# Patient Record
Sex: Female | Born: 1955
Health system: Southern US, Community
[De-identification: ages and names within clinical notes are randomized; demographics above are authoritative.]

## PROBLEM LIST (undated history)

## (undated) DIAGNOSIS — M1990 Unspecified osteoarthritis, unspecified site: Secondary | ICD-10-CM

## (undated) DIAGNOSIS — H8109 Meniere's disease, unspecified ear: Secondary | ICD-10-CM

## (undated) DIAGNOSIS — N200 Calculus of kidney: Secondary | ICD-10-CM

## (undated) DIAGNOSIS — J449 Chronic obstructive pulmonary disease, unspecified: Secondary | ICD-10-CM

## (undated) DIAGNOSIS — N12 Tubulo-interstitial nephritis, not specified as acute or chronic: Secondary | ICD-10-CM

## (undated) DIAGNOSIS — I82409 Acute embolism and thrombosis of unspecified deep veins of unspecified lower extremity: Secondary | ICD-10-CM

## (undated) DIAGNOSIS — R590 Localized enlarged lymph nodes: Secondary | ICD-10-CM

## (undated) DIAGNOSIS — C4491 Basal cell carcinoma of skin, unspecified: Secondary | ICD-10-CM

## (undated) DIAGNOSIS — J302 Other seasonal allergic rhinitis: Secondary | ICD-10-CM

## (undated) DIAGNOSIS — J189 Pneumonia, unspecified organism: Secondary | ICD-10-CM

## (undated) DIAGNOSIS — Z8709 Personal history of other diseases of the respiratory system: Secondary | ICD-10-CM

## (undated) DIAGNOSIS — M810 Age-related osteoporosis without current pathological fracture: Secondary | ICD-10-CM

## (undated) DIAGNOSIS — D0470 Carcinoma in situ of skin of unspecified lower limb, including hip: Secondary | ICD-10-CM

## (undated) HISTORY — DX: Calculus of kidney: N20.0

## (undated) HISTORY — DX: Basal cell carcinoma of skin, unspecified: C44.91

## (undated) HISTORY — DX: Acute embolism and thrombosis of unspecified deep veins of unspecified lower extremity: I82.409

## (undated) HISTORY — DX: Carcinoma in situ of skin of unspecified lower limb, including hip: D04.70

## (undated) HISTORY — DX: Pneumonia, unspecified organism: J18.9

## (undated) HISTORY — DX: Unspecified osteoarthritis, unspecified site: M19.90

## (undated) HISTORY — DX: Localized enlarged lymph nodes: R59.0

## (undated) HISTORY — DX: Tubulo-interstitial nephritis, not specified as acute or chronic: N12

## (undated) HISTORY — DX: Age-related osteoporosis without current pathological fracture: M81.0

## (undated) HISTORY — DX: Personal history of other diseases of the respiratory system: Z87.09

## (undated) HISTORY — DX: Chronic obstructive pulmonary disease, unspecified: J44.9

## (undated) HISTORY — DX: Other seasonal allergic rhinitis: J30.2

## (undated) HISTORY — DX: Meniere's disease, unspecified ear: H81.09

---

## 1998-08-13 ENCOUNTER — Other Ambulatory Visit: Admission: RE | Admit: 1998-08-13 | Discharge: 1998-08-13 | Payer: Self-pay | Admitting: Obstetrics & Gynecology

## 1999-09-24 ENCOUNTER — Other Ambulatory Visit: Admission: RE | Admit: 1999-09-24 | Discharge: 1999-09-24 | Payer: Self-pay | Admitting: Obstetrics & Gynecology

## 2000-10-20 ENCOUNTER — Other Ambulatory Visit: Admission: RE | Admit: 2000-10-20 | Discharge: 2000-10-20 | Payer: Self-pay | Admitting: Obstetrics & Gynecology

## 2002-02-16 ENCOUNTER — Other Ambulatory Visit: Admission: RE | Admit: 2002-02-16 | Discharge: 2002-02-16 | Payer: Self-pay | Admitting: Obstetrics & Gynecology

## 2004-02-20 ENCOUNTER — Other Ambulatory Visit: Admission: RE | Admit: 2004-02-20 | Discharge: 2004-02-20 | Payer: Self-pay | Admitting: Obstetrics & Gynecology

## 2005-03-13 ENCOUNTER — Other Ambulatory Visit: Admission: RE | Admit: 2005-03-13 | Discharge: 2005-03-13 | Payer: Self-pay | Admitting: Obstetrics & Gynecology

## 2007-05-06 HISTORY — PX: DILATION AND CURETTAGE OF UTERUS: SHX78

## 2007-06-25 ENCOUNTER — Encounter: Admission: RE | Admit: 2007-06-25 | Discharge: 2007-06-25 | Payer: Self-pay | Admitting: Family Medicine

## 2008-11-15 ENCOUNTER — Observation Stay (HOSPITAL_COMMUNITY): Admission: EM | Admit: 2008-11-15 | Discharge: 2008-11-15 | Payer: Self-pay | Admitting: Emergency Medicine

## 2008-11-16 DIAGNOSIS — J90 Pleural effusion, not elsewhere classified: Secondary | ICD-10-CM | POA: Insufficient documentation

## 2008-11-16 DIAGNOSIS — J189 Pneumonia, unspecified organism: Secondary | ICD-10-CM | POA: Insufficient documentation

## 2008-11-17 ENCOUNTER — Ambulatory Visit: Payer: Self-pay | Admitting: Pulmonary Disease

## 2008-11-17 ENCOUNTER — Encounter: Payer: Self-pay | Admitting: Pulmonary Disease

## 2008-11-23 ENCOUNTER — Telehealth: Payer: Self-pay | Admitting: Adult Health

## 2008-11-24 ENCOUNTER — Ambulatory Visit: Payer: Self-pay | Admitting: Pulmonary Disease

## 2008-12-04 ENCOUNTER — Encounter: Payer: Self-pay | Admitting: Pulmonary Disease

## 2008-12-04 ENCOUNTER — Ambulatory Visit: Payer: Self-pay | Admitting: Pulmonary Disease

## 2008-12-04 DIAGNOSIS — J449 Chronic obstructive pulmonary disease, unspecified: Secondary | ICD-10-CM | POA: Insufficient documentation

## 2008-12-04 DIAGNOSIS — F17200 Nicotine dependence, unspecified, uncomplicated: Secondary | ICD-10-CM | POA: Insufficient documentation

## 2008-12-05 ENCOUNTER — Encounter: Payer: Self-pay | Admitting: Pulmonary Disease

## 2008-12-08 ENCOUNTER — Ambulatory Visit: Payer: Self-pay | Admitting: Cardiology

## 2008-12-15 ENCOUNTER — Ambulatory Visit: Payer: Self-pay | Admitting: Pulmonary Disease

## 2008-12-22 ENCOUNTER — Encounter: Payer: Self-pay | Admitting: Pulmonary Disease

## 2009-01-03 ENCOUNTER — Ambulatory Visit: Payer: Self-pay | Admitting: Pulmonary Disease

## 2009-01-15 ENCOUNTER — Telehealth (INDEPENDENT_AMBULATORY_CARE_PROVIDER_SITE_OTHER): Payer: Self-pay | Admitting: *Deleted

## 2009-01-26 ENCOUNTER — Ambulatory Visit: Payer: Self-pay | Admitting: Pulmonary Disease

## 2009-02-02 ENCOUNTER — Ambulatory Visit: Payer: Self-pay | Admitting: Pulmonary Disease

## 2009-11-06 ENCOUNTER — Telehealth (INDEPENDENT_AMBULATORY_CARE_PROVIDER_SITE_OTHER): Payer: Self-pay | Admitting: *Deleted

## 2010-02-12 ENCOUNTER — Telehealth (INDEPENDENT_AMBULATORY_CARE_PROVIDER_SITE_OTHER): Payer: Self-pay | Admitting: *Deleted

## 2010-06-04 NOTE — Progress Notes (Signed)
Summary: Spiriva Refill  Phone Note Refill Request Message from:  Fax from Pharmacy on November 06, 2009 3:11 PM  Refills Requested: Medication #1:  SPIRIVA HANDIHALER 18 MCG CAPS one puff once daily   Dosage confirmed as above?Dosage Confirmed   Brand Name Necessary? Yes   Supply Requested: 1 month   Last Refilled: 09/01/2009   Notes: Patient last seen 02/02/2009 by Tammy Parrett [facility] [place] [phone]   Method Requested: Electronic Initial call taken by: Michel Bickers CMA,  November 06, 2009 3:12 PM    Prescriptions: SPIRIVA HANDIHALER 18 MCG CAPS (TIOTROPIUM BROMIDE MONOHYDRATE) one puff once daily  #30 x 3   Entered by:   Michel Bickers CMA   Authorized by:   Coralyn Helling MD   Signed by:   Michel Bickers CMA on 11/06/2009   Method used:   [facility] to        [facility]  [address] [phone]ail[facility]       [address] [place], [place]  [address]       Ph: [phone] or [phone]       Fax: [phone]   RxID:   [id number]

## 2010-06-04 NOTE — Miscellaneous (Signed)
Summary: Orders Update  Clinical Lists Changes  Orders: Added new Test order of T-2 View CXR (71020TC) - Signed    Received call from Radiology advising pt has rx from Canary Brim NP for pt to have PA&Lat cxr. Zackery Barefoot CMA  November 17, 2008 10:07 AM

## 2010-06-04 NOTE — Progress Notes (Signed)
Summary: pna vaccine question  Phone Note Call from Patient Call back at Home Phone [phone]   Caller: Patient Call For: sood Summary of Call: Pt wants to know if she has ever had a px shot here, ok to [facility]. Initial call taken by: Darletta Moll,  February 12, 2010 1:11 PM  Follow-up for Phone Call        no pna vaccine documented in EMR.  no paper chart as pt was a new pt to [facility] in 2010.  LMOM informing pt of the above information.  Aundra Millet Reynolds LPN  February 12, 2010 1:46 PM

## 2010-07-04 LAB — HM PAP SMEAR

## 2010-07-04 LAB — HM MAMMOGRAPHY

## 2010-08-12 LAB — COMPREHENSIVE METABOLIC PANEL
ALT: 36 U/L — ABNORMAL HIGH (ref 0–35)
AST: 29 U/L (ref 0–37)
Albumin: 2.8 g/dL — ABNORMAL LOW (ref 3.5–5.2)
Alkaline Phosphatase: 94 U/L (ref 39–117)
Calcium: 9.1 mg/dL (ref 8.4–10.5)
GFR calc Af Amer: >60 mL/min (ref >60)
Glucose, Bld: 96 mg/dL (ref 70–99)
Potassium: 3.7 mEq/L (ref 3.5–5.1)
Sodium: 138 mEq/L (ref 135–145)
Total Protein: 6.2 g/dL (ref 6.0–8.3)

## 2010-08-12 LAB — CBC
MCHC: 32.9 g/dL (ref 30.0–36.0)
Platelets: 192 10*3/uL (ref 150–400)
RDW: 14.2 % (ref 11.5–15.5)

## 2010-08-12 LAB — DIFFERENTIAL
Eosinophils Absolute: 0.2 10*3/uL (ref 0.0–0.7)
Lymphs Abs: 1.8 10*3/uL (ref 0.7–4.0)
Monocytes Absolute: 1.3 10*3/uL — ABNORMAL HIGH (ref 0.1–1.0)
Monocytes Relative: 15 % — ABNORMAL HIGH (ref 3–12)
Neutrophils Relative %: 62 % (ref 43–77)

## 2010-08-12 LAB — PROTIME-INR: Prothrombin Time: 13.9 seconds (ref 11.6–15.2)

## 2010-09-17 NOTE — H&P (Signed)
[name], [name]               ACCOUNT NO.:  [id number]   MEDICAL RECORD NO.:  [id number]          PATIENT TYPE:  OBV   LOCATION:  5532                         FACILITY:  [facility]   PHYSICIAN:  Coralyn Helling, MD        DATE OF BIRTH:  1955-06-25   DATE OF ADMISSION:  11/15/2008  DATE OF DISCHARGE:  11/15/2008                              HISTORY & PHYSICAL   A 24-HOUR OBSERVATION   PRIMARY CARE PHYSICIAN:  Pam Drown, MD with [facility] at [facility].   [name] is a 55 year old female who developed symptoms of fever,  chills, and cough with production of green to blood-tinged sputum last  week.  She was also having right-sided chest pain.  She denied any  abdominal pain, nausea, vomiting, or diarrhea.  She has not had any  headaches, neck stiffness, or difficulty with swallowing.  She says her  weight has been stable.  She has not had any recent sick contacts.  She  was started on a course of Z-Pak and then it was changed to cefuroxime.  She does have a history of allergies to PENICILLIN.  She says that she  is still having cough with production of green to red-brown sputum, as  well as pleuritic-type chest pain on the right side.  She is also  suffering a fever of 100-101 degrees Fahrenheit.  She had a chest x-ray  on an outpatient basis, which showed a pleural effusion and as a result,  she was advised to come to the hospital for further evaluation of this  pleural effusion and pneumonia to determine if she needs thoracentesis  and possibly further interventions.  Of note is that she has had 2 prior  episodes of pneumonia, the most recent one being in February 2009.   PAST MEDICAL HISTORY:  Significant for:  1. Tobacco abuse.  2. Pneumonia.   She has allergies to PENICILLIN which caused her to develop a rash.   OUTPATIENT MEDICATIONS:  Cefuroxime and Advil.   PAST SURGICAL HISTORY:  Negative.   FAMILY HISTORY:  Noncontributory.   SOCIAL HISTORY:  She smokes a pack of  cigarettes per day.   REVIEW OF SYSTEMS:  Unremarkable except for stated above.   PHYSICAL EXAMINATION:  VITAL SIGNS:  Reviewed in the emergency room.  HEENT:  Pupils reactive.  There is no sinus tenderness, no oral lesions.  NECK:  No lymphadenopathy.  No jugular venous distention.  No  thyromegaly.  HEART:  S1 and S2.  Regular rate and rhythm.  No murmur.  CHEST:  She had decreased breath sounds.  There is no wheezing.  There  is dullness to percussion at the right base.  ABDOMEN:  Thin, soft, nontender, positive bowel sounds.  EXTREMITIES:  There is no edema, cyanosis, or clubbing.  NEUROLOGIC:  Cranial nerves were intact, and she had normal strength.   Chest x-ray in the emergency room today showed right basal infiltrate  with pleural effusion and bronchial thickening.   LABORATORY TESTS:  Pending at this time.   IMPRESSION:  Recurrent pneumonia with hemoptysis and pleural effusion in  a 55 year old female who has a history of tobacco abuse.  I have  recommended admitting the patient to 24-hour observation.  I will review  her laboratory tests and review her pleural effusion with ultrasound.  Depending upon the appearance of this, we will decide if she warrants  thoracentesis, and if she does require thoracentesis, the fluid will be  sent for further analysis and depending upon the results of this,  further interventions may be necessary.  At some point, she will also  likely need to have a CT scan of her chest to further evaluate the  possibilities of her recurrent pneumonia, as well as to further evaluate  her hemoptysis, although my suspicion is highly related to an infection  but again she does have a history of tobacco abuse.  In addition, I will  change her course of antibiotics to Avelox 400 mg p.o. daily.      Coralyn Helling, MD  Electronically Signed     VS/MEDQ  D:  11/15/2008  T:  11/16/2008  Job:  [id number]   cc:   Pam Drown, M.D.

## 2010-09-17 NOTE — Discharge Summary (Signed)
[name], [name]               ACCOUNT NO.:  [id number]   MEDICAL RECORD NO.:  [id number]          PATIENT TYPE:  OBV   LOCATION:  5532                         FACILITY:  [facility]   PHYSICIAN:  Coralyn Helling, MD        DATE OF BIRTH:  Mar 13, 1956   DATE OF ADMISSION:  11/15/2008  DATE OF DISCHARGE:  11/15/2008                               DISCHARGE SUMMARY   DISCHARGE DIAGNOSIS:  Pneumonia with right pleural effusion.   RADIOLOGIC DATA:  1. November 15, 2008 two-view of the chest revealed right basilar airspace      disease and small right pleural effusion.  Moderate peribronchial      thickening which likely represents chronic bronchitis/smoking.  2. November 15, 2008 bilateral decubitus revealed small layering right      pleural effusion with a focal airspace disease in right lung base.      Small infrahilar right pulmonary nodule was noted only on the left      side down view.   LABORATORY DATA:  1. November 15, 2008 CBC revealed WBC of 8.7, RBC 4.30, hemoglobin 13.4,      hematocrit 40.8, MCV 94.9, MCHC 32.9, RDW 14.2, platelet count 192      with 62% neutrophils, 20% lymphocytes, 15% monocytes and 2%      eosinophils.  2. November 15, 2008 PT of 13.9, INR 1.0.  3. November 15, 2008 PTT of 32.  4. November 15, 2008 CMP revealed sodium 138, potassium 3.7, chloride 102,      CO2 28, glucose 96, BUN 12, creatinine 0.79, total bilirubin 0.3,      alkaline phosphatase 94, AST 29, ALT 36, total protein 6.2, albumin      2.8, calcium 9.1.  November 15, 2008 LDH of 129.   HISTORY OF PRESENT ILLNESS:  [name] is a 55 year old white female  with a history of tobacco abuse and recurrent pneumonia.  She developed  fevers, chills and cough with blood streaking in the sputum last week.  She was treated with a Z-Pak and cefuroxime by her primary care  Breckyn Troyer.  Of note, she does have a PENICILLIN allergy.  She still has  had low-grade fevers, right pleuritic chest pain and cough.  Outpatient  chest x-ray showed  no pleural effusion which was confirmed on x-ray in  the emergency room.  She was admitted for 24-hour observation with the  possibility of thoracentesis after laboratory review and assessment of  effusion with ultrasound.  Her antibiotics were changed from Ceftin to  Avelox.  She also was advised on discontinuing smoking.   HOSPITAL COURSE BY DISCHARGE DIAGNOSIS:  As per above, [name] was  admitted for 24-hour observation for further evaluation of pneumonia and  right pleural effusion.  Upon further assessment with ultrasound it was  felt that only minimal fluid was visualized and thoracentesis was  deferred.  Again her antibiotics were changed to Avelox and she will  continue post discharge.   DISCHARGE INSTRUCTIONS:  1. Diet; as tolerated.  2. Activity; as tolerated.  3. Medications Avelox 400 mg by mouth daily  for 6 more days to      complete a total of 7 days.  4. The patient has been advised to use Tylenol and Motrin as needed      for pleuritic chest pain.   FOLLOWUP:  [name] will be called tomorrow a.m. with a followup  appointment as the office is now closed.  Phone number to reach her is  [phone].  She will be scheduled for a followup appointment on Friday  with Tammy Parrett at [facility].  Again of note on chest x-ray  left side down position was noted that she has a pulmonary nodule that  will need further evaluation.  It has also been discussed with [name] that if she has a worsening or increase in chest pain, shortness  of breath, fevers, cough and/or sputum production, she is to return  immediately to the [facility] emergency room for further evaluation.  She also has been advised to stop smoking.   DISPOSITION:  At the time of discharge, [name] is currently  medically stable at the time of discharge and is cleared to follow up on  Friday in office.   TIME SPENT ON DISPOSITION:  20 minutes.      Canary Brim, NP      Coralyn Helling, MD  Electronically Signed    BO/MEDQ  D:  11/15/2008  T:  11/16/2008  Job:  [id number]   cc:   Rubye Oaks, NP

## 2011-03-17 ENCOUNTER — Encounter: Payer: Self-pay | Admitting: Family Medicine

## 2011-03-17 ENCOUNTER — Ambulatory Visit (INDEPENDENT_AMBULATORY_CARE_PROVIDER_SITE_OTHER): Payer: BC Managed Care – PPO | Admitting: Family Medicine

## 2011-03-17 VITALS — BP 157/88 | HR 68 | Temp 98.1°F | Ht 64.0 in | Wt 116.1 lb

## 2011-03-17 DIAGNOSIS — T7840XA Allergy, unspecified, initial encounter: Secondary | ICD-10-CM

## 2011-03-17 DIAGNOSIS — F17200 Nicotine dependence, unspecified, uncomplicated: Secondary | ICD-10-CM

## 2011-03-17 DIAGNOSIS — J40 Bronchitis, not specified as acute or chronic: Secondary | ICD-10-CM

## 2011-03-17 DIAGNOSIS — J209 Acute bronchitis, unspecified: Secondary | ICD-10-CM

## 2011-03-17 DIAGNOSIS — IMO0002 Reserved for concepts with insufficient information to code with codable children: Secondary | ICD-10-CM | POA: Insufficient documentation

## 2011-03-17 DIAGNOSIS — Z720 Tobacco use: Secondary | ICD-10-CM

## 2011-03-17 DIAGNOSIS — Z0000 Encounter for general adult medical examination without abnormal findings: Secondary | ICD-10-CM

## 2011-03-17 MED ORDER — CHLORPHENIRAMINE-HYDROCODONE 8-10 MG/5ML PO LQCR: 5.0000 mL | Freq: Two times a day (BID) | ORAL | Status: DC | PRN

## 2011-03-17 MED ORDER — NICOTINE 7 MG/24HR TD PT24: 1.0000 | MEDICATED_PATCH | TRANSDERMAL | Status: AC

## 2011-03-17 MED ORDER — NICOTINE 14 MG/24HR TD PT24: 1.0000 | MEDICATED_PATCH | TRANSDERMAL | Status: AC

## 2011-03-17 MED ORDER — ALBUTEROL SULFATE HFA 108 (90 BASE) MCG/ACT IN AERS: 2.0000 | INHALATION_SPRAY | Freq: Four times a day (QID) | RESPIRATORY_TRACT | Status: DC | PRN

## 2011-03-17 MED ORDER — CIPROFLOXACIN HCL 500 MG PO TB24: 500.0000 mg | ORAL_TABLET | Freq: Two times a day (BID) | ORAL | Status: AC

## 2011-03-17 MED ORDER — GUAIFENESIN ER 600 MG PO TB12: 1200.0000 mg | ORAL_TABLET | Freq: Two times a day (BID) | ORAL | Status: DC

## 2011-03-17 NOTE — Patient Instructions (Addendum)
Preventative Care for Adults, Female A healthy lifestyle and preventative care can promote health and wellness. Preventative health guidelines for women include the following key practices:  A routine yearly physical is a good way to check with your caregiver about your health and preventative screening. It is a chance to share any concerns and updates on your health, and to receive a thorough exam.   Visit your dentist for a routine exam and preventative care every 6 months. Brush your teeth twice a day and floss once a day. Good oral hygiene prevents tooth decay and gum disease.   The frequency of eye exams is based on your age, health, family medical history, use of contact lenses, and other factors. Follow your caregiver's recommendations for frequency of eye exams.   Eat a healthy diet. Foods like vegetables, fruits, whole grains, low-fat dairy products, and lean protein foods contain the nutrients you need without too many calories. Decrease your intake of foods high in solid fats, added sugars, and salt. Eat the right amount of calories for you.Get information about a proper diet from your caregiver, if necessary.   Regular physical exercise is one of the most important things you can do for your health. Most adults should get at least 150 minutes of moderate-intensity exercise (any activity that increases your heart rate and causes you to sweat) each week. In addition, most adults need muscle-strengthening exercises on 2 or more days a week.   Maintain a healthy weight. The body mass index (BMI) is a screening tool to identify possible weight problems. It provides an estimate of body fat based on height and weight. Your caregiver can help determine your BMI, and can help you achieve or maintain a healthy weight.For adults 20 years and older:   A BMI below 18.5 is considered underweight.   A BMI of 18.5 to 24.9 is normal.   A BMI of 25 to 29.9 is considered overweight.   A BMI of 30 and  above is considered obese.   Maintain normal blood lipids and cholesterol levels by exercising and minimizing your intake of saturated fat. Eat a balanced diet with plenty of fruit and vegetables. Blood tests for lipids and cholesterol should begin at age 20 and be repeated every 5 years. If your lipid or cholesterol levels are high, you are over 50, or you are a high risk for heart disease, you may need your cholesterol levels checked more frequently.Ongoing high lipid and cholesterol levels should be treated with medicines if diet and exercise are not effective.   If you smoke, find out from your caregiver how to quit. If you do not use tobacco, do not start.   If you are pregnant, do not drink alcohol. If you are breastfeeding, be very cautious about drinking alcohol. If you are not pregnant and choose to drink alcohol, do not exceed 1 drink per day. One drink is considered to be 12 ounces (355 mL) of beer, 5 ounces (148 mL) of wine, or 1.5 ounces (44 mL) of liquor.   Avoid use of street drugs. Do not share needles with anyone. Ask for help if you need support or instructions about stopping the use of drugs.   High blood pressure causes heart disease and increases the risk of stroke. Your blood pressure should be checked at least every 1 to 2 years. Ongoing high blood pressure should be treated with medicines if weight loss and exercise are not effective.   If you are 55 to 55   years old, ask your caregiver if you should take aspirin to prevent strokes.   Diabetes screening involves taking a blood sample to check your fasting blood sugar level. This should be done once every 3 years, after age 45, if you are within normal weight and without risk factors for diabetes. Testing should be considered at a younger age or be carried out more frequently if you are overweight and have at least 1 risk factor for diabetes.   Breast cancer screening is essential preventative care for women. You should  practice "breast self-awareness." This means understanding the normal appearance and feel of your breasts and may include breast self-examination. Any changes detected, no matter how small, should be reported to a caregiver. Women in their 20s and 30s should have a clinical breast exam (CBE) by a caregiver as part of a regular health exam every 1 to 3 years. After age 40, women should have a CBE every year. Starting at age 40, women should consider having a mammogram (breast X-ray) every year. Women who have a family history of breast cancer should talk to their caregiver about genetic screening. Women at a high risk of breast cancer should talk to their caregiver about having an MRI and a mammogram every year.   The Pap test is a screening test for cervical cancer. A Pap test can show cell changes on the cervix that might become cervical cancer if left untreated. A Pap test is a procedure in which cells are obtained and examined from the lower end of the uterus (cervix).   Women should have a Pap test starting at age 21.   Between ages 21 and 29, Pap tests should be repeated every 2 years.   Beginning at age 30, you should have a Pap test every 3 years as long as the past 3 Pap tests have been normal.   Some women have medical problems that increase the chance of getting cervical cancer. Talk to your caregiver about these problems. It is especially important to talk to your caregiver if a new problem develops soon after your last Pap test. In these cases, your caregiver may recommend more frequent screening and Pap tests.   The above recommendations are the same for women who have or have not gotten the vaccine for human papillomavirus (HPV).   If you had a hysterectomy for a problem that was not cancer or a condition that could lead to cancer, then you no longer need Pap tests. Even if you no longer need a Pap test, a regular exam is a good idea to make sure no other problems are starting.   If you  are between ages 65 and 70, and you have had normal Pap tests going back 10 years, you no longer need Pap tests. Even if you no longer need a Pap test, a regular exam is a good idea to make sure no other problems are starting.   If you have had past treatment for cervical cancer or a condition that could lead to cancer, you need Pap tests and screening for cancer for at least 20 years after your treatment.   If Pap tests have been discontinued, risk factors (such as a new sexual partner) need to be reassessed to determine if screening should be resumed.   The HPV test is an additional test that may be used for cervical cancer screening. The HPV test looks for the virus that can cause the cell changes on the cervix. The cells collected   during the Pap test can be tested for HPV. The HPV test could be used to screen women aged 30 years and older, and should be used in women of any age who have unclear Pap test results. After the age of 30, women should have HPV testing at the same frequency as a Pap test.   Colorectal cancer can be detected and often prevented. Most routine colorectal cancer screening begins at the age of 50 and continues through age 75. However, your caregiver may recommend screening at an earlier age if you have risk factors for colon cancer. On a yearly basis, your caregiver may provide home test kits to check for hidden blood in the stool. Use of a small camera at the end of a tube, to directly examine the colon (sigmoidoscopy or colonoscopy), can detect the earliest forms of colorectal cancer. Talk to your caregiver about this at age 50, when routine screening begins. Direct examination of the colon should be repeated every 5 to 10 years through age 75, unless early forms of pre-cancerous polyps or small growths are found.   Practice safe sex. Use condoms and avoid high-risk sexual practices to reduce the spread of sexually transmitted infections (STIs). STIs include gonorrhea,  chlamydia, syphilis, trichomonas, herpes, HPV, and human immunodeficiency virus (HIV). Herpes, HIV, and HPV are viral illnesses that have no cure. They can result in disability, cancer, and death. Sexually active women aged 25 and younger should be checked for Chlamydia. Older women with new or multiple partners should also be tested for Chlamydia. Testing for other STIs is recommended if you are sexually active and at increased risk.   Osteoporosis is a disease in which the bones lose minerals and strength with aging. This can result in serious bone fractures. The risk of osteoporosis can be identified using a bone density scan. Women ages 65 and over and women at risk for fractures or osteoporosis should discuss screening with their caregivers. Ask your caregiver whether you should take a calcium supplement or vitamin D to reduce the rate of osteoporosis.   Menopause can be associated with physical symptoms and risks. Hormone replacement therapy is available to decrease symptoms and risks. You should talk to your caregiver about whether hormone replacement therapy is right for you.   Use sunscreen with skin protection factor (SPF) of 30 or more. Apply sunscreen liberally and repeatedly throughout the day. You should seek shade when your shadow is shorter than you. Protect yourself by wearing long sleeves, pants, a wide-brimmed hat, and sunglasses year round, whenever you are outdoors.   Once a month, do a whole body skin exam, using a mirror to look at the skin on your back. Notify your caregiver of new moles, moles that have irregular borders, moles that are larger than a pencil eraser, or moles that have changed in shape or color.   Stay current with required immunizations.   Influenza. You need a dose every fall (or winter). The composition of the flu vaccine changes each year, so being vaccinated once is not enough.   Pneumococcal polysaccharide. You need 1 to 2 doses if you smoke cigarettes or  if you have certain chronic medical conditions. You need 1 dose at age 65 (or older) if you have never been vaccinated.   Tetanus, diphtheria, pertussis (Tdap, Td). Get 1 dose of Tdap vaccine if you are younger than age 65 years, are over 65 and have contact with an infant, are a healthcare worker, are pregnant, or simply want   to be protected from whooping cough. After that, you need a Td booster dose every 10 years. Consult your caregiver if you have not had at least 3 tetanus and diphtheria-containing shots sometime in your life or have a deep or dirty wound.   HPV. You need this vaccine if you are a woman age 26 years or younger. The vaccine is given in 3 doses over 6 months.   Measles, mumps, rubella (MMR). You need at least 1 dose of MMR if you were born in 1957 or later. You may also need a 2nd dose.   Meningococcal. If you are age 19 to 21 years and a first-year college student living in a residence hall, or have one of several medical conditions, you need to get vaccinated against meningococcal disease. You may also need additional booster doses.   Zoster (shingles). If you are age 60 years or older, you should get this vaccine.   Varicella (chickenpox). If you have never had chickenpox or you were vaccinated but received only 1 dose, talk to your caregiver to find out if you need this vaccine.   Hepatitis A. You need this vaccine if you have a specific risk factor for hepatitis A virus infection or you simply wish to be protected from this disease. The vaccine is usually given as 2 doses, 6 to 18 months apart.   Hepatitis B. You need this vaccine if you have a specific risk factor for hepatitis B virus infection or you simply wish to be protected from this disease. The vaccine is given in 3 doses, usually over 6 months.  Preventative Services / Frequency Ages 19 to 39  Blood pressure check.** / Every 1 to 2 years.   Lipid and cholesterol check.**/ Every 5 years beginning at age 20.    Clinical breast exam.** / Every 3 years for women in their 20s and 30s.   Pap Test.** / Every 2 years from ages 21 through 29. Every 3 years starting at age 30 years through age 65 or 70 with a history of 3 consecutive normal Pap tests.   HPV Screening.** / Every 3 years from ages 30 through ages 65 to 70 with a history of 3 consecutive normal Pap tests.   Skin self-exam. / Monthly.   Influenza immunization.** / Every year.   Pneumococcal polysaccharide immunization.** / 1 to 2 doses if you smoke cigarettes or if you have certain chronic medical conditions.   Tetanus, diphtheria, pertussis (Tdap,Td) immunization. / A one-time dose of Tdap vaccine. After that, you need a Td booster dose every 10 years.   HPV immunization. / 3 doses over 6 months, if 26 and younger.   Measles, mumps, rubella (MMR) immunization. / You need at least 1 dose of MMR if you were born in 1957 or later. You may also need a 2nd dose.   Meningococcal immunization. / 1 dose if you are age 19 to 21 years and a first-year college student living in a residence hall, or have one of several medical conditions, you need to get vaccinated against meningococcal disease. You may also need additional booster doses.   Varicella immunization. **/ Consult your caregiver.   Hepatitis A immunization. ** / Consult your caregiver. 2 doses, 6 to 18 months apart.   Hepatitis B immunization.** / Consult your caregiver. 3 doses usually over 6 months.  Ages 40 to 64  Blood pressure check.** / Every 1 to 2 years.   Lipid and cholesterol check.**/ Every 5 years beginning   at age 20.   Clinical breast exam.** / Every year after age 40.   Mammogram.** / Every year beginning at age 40 and continuing for as long as you are in good health. Consult with your caregiver.   Pap Test.** / Every 3 years starting at age 30 years through age 65 or 70 with a history of 3 consecutive normal Pap tests.   HPV Screening.** / Every 3 years from  ages 30 through ages 65 to 70 with a history of 3 consecutive normal Pap tests.   Fecal occult blood test (FOBT) of stool. / Every year beginning at age 50 and continuing until age 75. You may not have to do this test if you get colonoscopy every 10 years.   Flexible sigmoidoscopy** or colonoscopy.** / Every 5 years for a flexible sigmoidoscopy or every 10 years for a colonoscopy beginning at age 50 and continuing until age 75.   Skin self-exam. / Monthly.   Influenza immunization.** / Every year.   Pneumococcal polysaccharide immunization.** / 1 to 2 doses if you smoke cigarettes or if you have certain chronic medical conditions.   Tetanus, diphtheria, pertussis (Tdap/Td) immunization.** / A one-time dose of Tdap vaccine. After that, you need a Td booster dose every 10 years.   Measles, mumps, rubella (MMR) immunization. / You need at least 1 dose of MMR if you were born in 1957 or later. You may also need a 2nd dose.   Varicella immunization. **/ Consult your caregiver.   Meningococcal immunization.** / Consult your caregiver.     Hepatitis A immunization. ** / Consult your caregiver. 2 doses, 6 to 18 months apart.   Hepatitis B immunization.** / Consult your caregiver. 3 doses, usually over 6 months.  Ages 65 and over  Blood pressure check.** / Every 1 to 2 years.   Lipid and cholesterol check.**/ Every 5 years beginning at age 20.   Clinical breast exam.** / Every year after age 40.   Mammogram.** / Every year beginning at age 40 and continuing for as long as you are in good health. Consult with your caregiver.   Pap Test,** / Every 3 years starting at age 30 years through age 65 or 70 with a 3 consecutive normal Pap tests. Testing can be stopped between 65 and 70 with 3 consecutive normal Pap tests and no abnormal Pap or HPV tests in the past 10 years.   HPV Screening.** / Every 3 years from ages 30 through ages 65 or 70 with a history of 3 consecutive normal Pap tests.  Testing can be stopped between 65 and 70 with 3 consecutive normal Pap tests and no abnormal Pap or HPV tests in the past 10 years.   Fecal occult blood test (FOBT) of stool. / Every year beginning at age 50 and continuing until age 75. You may not have to do this test if you get colonoscopy every 10 years.   Flexible sigmoidoscopy** or colonoscopy.** / Every 5 years for a flexible sigmoidoscopy or every 10 years for a colonoscopy beginning at age 50 and continuing until age 75.   Osteoporosis screening.** / A one-time screening for women ages 65 and over and women at risk for fractures or osteoporosis.   Skin self-exam. / Monthly.   Influenza immunization.** / Every year.   Pneumococcal polysaccharide immunization.** / 1 dose at age 65 (or older) if you have never been vaccinated.   Tetanus, diphtheria, pertussis (Tdap, Td) immunization. / A one-time dose of Tdap   vaccine if you are over 65 and have contact with an infant, are a Research scientist (physical sciences), or simply want to be protected from whooping cough. After that, you need a Td booster dose every 10 years.   Varicella immunization. **/ Consult your caregiver.   Meningococcal immunization.** / Consult your caregiver.   Hepatitis A immunization. ** / Consult your caregiver. 2 doses, 6 to 18 months apart.   Hepatitis B immunization.** / Check with your caregiver. 3 doses, usually over 6 months.  ** Family history and personal history of risk and conditions may change your caregiver's recommendations. Document Released: 06/17/2001 Document Revised: 01/01/2011 Document Reviewed: 09/16/2010 [facility] Patient Information 2012 [place], [place].Bronchitis Bronchitis is the body's way of reacting to injury and/or infection (inflammation) of the bronchi. Bronchi are the air tubes that extend from the windpipe into the lungs. If the inflammation becomes severe, it may cause shortness of breath. CAUSES  Inflammation may be caused by:  A virus.   Germs  (bacteria).   Dust.   Allergens.   Pollutants and many other irritants.  The cells lining the bronchial tree are covered with tiny hairs (cilia). These constantly beat upward, away from the lungs, toward the mouth. This keeps the lungs free of pollutants. When these cells become too irritated and are unable to do their job, mucus begins to develop. This causes the characteristic cough of bronchitis. The cough clears the lungs when the cilia are unable to do their job. Without either of these protective mechanisms, the mucus would settle in the lungs. Then you would develop pneumonia. Smoking is a common cause of bronchitis and can contribute to pneumonia. Stopping this habit is the single most important thing you can do to help yourself. TREATMENT   Your caregiver may prescribe an antibiotic if the cough is caused by bacteria. Also, medicines that open up your airways make it easier to breathe. Your caregiver may also recommend or prescribe an expectorant. It will loosen the mucus to be coughed up. Only take over-the-counter or prescription medicines for pain, discomfort, or fever as directed by your caregiver.   Removing whatever causes the problem (smoking, for example) is critical to preventing the problem from getting worse.   Cough suppressants may be prescribed for relief of cough symptoms.   Inhaled medicines may be prescribed to help with symptoms now and to help prevent problems from returning.   For those with recurrent (chronic) bronchitis, there may be a need for steroid medicines.  SEEK IMMEDIATE MEDICAL CARE IF:   During treatment, you develop more pus-like mucus (purulent sputum).   You have a fever.   Your baby is older than 3 months with a rectal temperature of 102 F (38.9 C) or higher.   Your baby is 61 months old or younger with a rectal temperature of 100.4 F (38 C) or higher.   You become progressively more ill.   You have increased difficulty breathing,  wheezing, or shortness of breath.  It is necessary to seek immediate medical care if you are elderly or sick from any other disease. MAKE SURE YOU:   Understand these instructions.   Will watch your condition.   Will get help right away if you are not doing well or get worse.  Document Released: 04/21/2005 Document Revised: 01/01/2011 Document Reviewed: 02/29/2008 [facility] Patient Information 2012 [place], [place].  Call if you decide you want to start the Wellbutrin/Bupropion  Increase your noncaffeinated beverages

## 2011-03-18 ENCOUNTER — Ambulatory Visit: Payer: Self-pay | Admitting: Family Medicine

## 2011-03-24 ENCOUNTER — Encounter: Payer: Self-pay | Admitting: Family Medicine

## 2011-03-24 DIAGNOSIS — Z0000 Encounter for general adult medical examination without abnormal findings: Secondary | ICD-10-CM | POA: Insufficient documentation

## 2011-03-24 DIAGNOSIS — Z8709 Personal history of other diseases of the respiratory system: Secondary | ICD-10-CM

## 2011-03-24 HISTORY — DX: Personal history of other diseases of the respiratory system: Z87.09

## 2011-03-24 NOTE — Progress Notes (Signed)
[name] [id number] 1955-06-18 03/24/2011      Progress Note New Patient  Subjective  Chief Complaint  Chief Complaint  Patient presents with  . Establish Care    new patient  . possible bronchitis    X 1 month    HPI  Patient is a 55 yo female in todayf or urgent new patient appt. She has been ill for roughly a month. Initially all of the congestion and symptoms were in the head, she went to an Urgent care and was treated with Amoxicillin for sinusitis and she feels she got partially better but she now says it has moved into her chest. She has fevers, chill,s fatigue, anorexia, cough keeping her up at night. Cough is productive of green phlegm, No cp/palp/gi or gu c/o other wise noted. She continues to smoke a PPD despite her illness.  Past Medical History  Diagnosis Date  . Allergy     seasonal  . Chicken pox as a child  . Measles as a child  . Cancer     basal cell  . Bronchitis, acute 03/24/2011  . Preventative health care 03/24/2011    Past Surgical History  Procedure Date  . Biopsy on skin cancer   . Dilation and curettage of uterus 2009    Family History  Problem Relation Age of Onset  . Hypertension Mother   . Glaucoma Mother   . Irritable bowel syndrome Mother   . Arthritis Mother   . Kidney disease Mother   . Diabetes Mother   . Hypertension Father   . Heart disease Father     bypasses  . Cancer Father     sarcoma  . Cancer Maternal Grandmother     breast  . Heart disease Maternal Grandmother     CHF  . Stroke Maternal Grandmother     in his 65's  . Stroke Maternal Grandfather   . Other Paternal Grandmother     hardening of the arteries  . Heart attack Paternal Grandfather     History   Social History  . Marital Status: Married    Spouse Name: N/A    Number of Children: N/A  . Years of Education: N/A   Occupational History  . Not on file.   Social History Main Topics  . Smoking status: Current Everyday Smoker -- 1.0 packs/day  for 29 years    Types: Cigarettes  . Smokeless tobacco: Never Used  . Alcohol Use: Yes     occasionally  . Drug Use: No  . Sexually Active: Yes -- Female partner(s)   Other Topics Concern  . Not on file   Social History Narrative  . No narrative on file    No current outpatient prescriptions on file prior to visit.    Allergies  Allergen Reactions  . Penicillins     Review of Systems  Review of Systems  Constitutional: Positive for fever, chills and malaise/fatigue.  HENT: Positive for congestion. Negative for hearing loss and nosebleeds.   Eyes: Negative for discharge.  Respiratory: Positive for cough, sputum production and shortness of breath. Negative for wheezing.   Cardiovascular: Negative for chest pain, palpitations and leg swelling.  Gastrointestinal: Negative for heartburn, nausea, vomiting, abdominal pain, diarrhea, constipation and blood in stool.  Genitourinary: Negative for dysuria, urgency, frequency and hematuria.  Musculoskeletal: Positive for myalgias. Negative for back pain and falls.  Skin: Negative for rash.  Neurological: Negative for dizziness, tremors, sensory change, focal weakness, loss of consciousness, weakness and  headaches.  Endo/Heme/Allergies: Negative for polydipsia. Does not bruise/bleed easily.  Psychiatric/Behavioral: Negative for depression and suicidal ideas. The patient is not nervous/anxious and does not have insomnia.     Objective  BP 157/88  Pulse 68  Temp(Src) 98.1 F (36.7 C) (Oral)  Ht 5\' 4"  (1.626 m)  Wt 116 lb 1.9 oz (52.672 kg)  BMI 19.93 kg/m2  SpO2 97%  Physical Exam  Physical Exam  Constitutional: She is oriented to person, place, and time and well-developed, well-nourished, and in no distress. No distress.  HENT:  Head: Normocephalic and atraumatic.  Right Ear: External ear normal.  Left Ear: External ear normal.  Nose: Nose normal.  Mouth/Throat: Oropharynx is clear and moist. No oropharyngeal exudate.    Eyes: Conjunctivae are normal. Pupils are equal, round, and reactive to light. Right eye exhibits no discharge. Left eye exhibits no discharge. No scleral icterus.  Neck: Normal range of motion. Neck supple. No thyromegaly present.  Cardiovascular: Normal rate, regular rhythm, normal heart sounds and intact distal pulses.   No murmur heard. Pulmonary/Chest: Effort normal and breath sounds normal. No respiratory distress. She has no wheezes. She has no rales.       Decreased bs b/l bases  Abdominal: Soft. Bowel sounds are normal. She exhibits no distension and no mass. There is no tenderness.  Musculoskeletal: Normal range of motion. She exhibits no edema and no tenderness.  Lymphadenopathy:    She has no cervical adenopathy.  Neurological: She is alert and oriented to person, place, and time. She has normal reflexes. No cranial nerve deficit. Coordination normal.  Skin: Skin is warm and dry. No rash noted. She is not diaphoretic.  Psychiatric: Mood, memory and affect normal.       Assessment & Plan  Bronchitis, acute  Patient had a recent course of Amoxicillin but only helped slightly, will start Ciprofloxacin bid and Mucinex, increase rest and fluids  Allergic state May use OTC nonsedating anti histamines  TOBACCO ABUSE Encouraged complete cessation, patient noncommital, encouraged to at least cut back, has used Chantix in the past with some results  Preventative health care Immunizations given today, patient has been seen previously with [facility] for paps

## 2011-03-24 NOTE — Assessment & Plan Note (Addendum)
Encouraged complete cessation, patient noncommital, encouraged to at least cut back, has used Chantix in the past with some results

## 2011-03-24 NOTE — Assessment & Plan Note (Addendum)
Patient had a recent course of Amoxicillin but only helped slightly, will start Ciprofloxacin bid and Mucinex, increase rest and fluids

## 2011-03-24 NOTE — Assessment & Plan Note (Signed)
Immunizations given today, patient has been seen previously with [facility] for paps

## 2011-03-24 NOTE — Assessment & Plan Note (Signed)
May use OTC nonsedating anti histamines

## 2011-04-08 ENCOUNTER — Encounter: Payer: Self-pay | Admitting: Internal Medicine

## 2011-04-09 ENCOUNTER — Ambulatory Visit (INDEPENDENT_AMBULATORY_CARE_PROVIDER_SITE_OTHER): Payer: BC Managed Care – PPO

## 2011-04-09 DIAGNOSIS — Z23 Encounter for immunization: Secondary | ICD-10-CM

## 2011-04-21 ENCOUNTER — Other Ambulatory Visit: Payer: BC Managed Care – PPO | Admitting: Internal Medicine

## 2011-05-05 ENCOUNTER — Ambulatory Visit (INDEPENDENT_AMBULATORY_CARE_PROVIDER_SITE_OTHER): Payer: BC Managed Care – PPO | Admitting: Family Medicine

## 2011-05-05 ENCOUNTER — Encounter: Payer: Self-pay | Admitting: Family Medicine

## 2011-05-05 VITALS — BP 143/86 | HR 79 | Temp 98.2°F | Ht 64.0 in | Wt 113.8 lb

## 2011-05-05 DIAGNOSIS — F17200 Nicotine dependence, unspecified, uncomplicated: Secondary | ICD-10-CM

## 2011-05-05 DIAGNOSIS — J209 Acute bronchitis, unspecified: Secondary | ICD-10-CM

## 2011-05-05 DIAGNOSIS — J40 Bronchitis, not specified as acute or chronic: Secondary | ICD-10-CM

## 2011-05-05 MED ORDER — SULFAMETHOXAZOLE-TRIMETHOPRIM 800-160 MG PO TABS: 1.0000 | ORAL_TABLET | Freq: Two times a day (BID) | ORAL | Status: AC

## 2011-05-05 MED ORDER — ALBUTEROL SULFATE HFA 108 (90 BASE) MCG/ACT IN AERS: 2.0000 | INHALATION_SPRAY | Freq: Four times a day (QID) | RESPIRATORY_TRACT | Status: DC | PRN

## 2011-05-05 MED ORDER — PREDNISONE 20 MG PO TABS
ORAL_TABLET | ORAL | Status: DC
Start: 2011-05-05 — End: 2011-05-22

## 2011-05-05 NOTE — Progress Notes (Signed)
[name], female   DOB: 1955-06-15, 55 y.o.   MRN: [id number] [name] [id number] [birth year] 05/05/2011      Progress Note-Follow Up  Subjective  Chief Complaint  Chief Complaint  Patient presents with  . Nasal Congestion    in chest- much worse    HPI  Patient is a 54 year old Caucasian female who is in today with persistent respiratory symptoms. She was treated at the last couple months for a bronchitis and did get better with treatment but slowly since her treatment ended several weeks ago her symptoms are worsened again at present she is using more albuterol. She is short of breath and coughing excessively. She has trouble resting. Her cough is nonproductive yellow phlegm. She denies fevers chills but does feel malaise, myalgias. No chest pain or palpitations. No ear pain or significant sore throat. Minimal headache. She has not taken any over-the-counter medications except for some Mucinex to  Past Medical History  Diagnosis Date  . Allergy     seasonal  . Chicken pox as a child  . Measles as a child  . Cancer     basal cell  . Bronchitis, acute 03/24/2011  . Preventative health care 03/24/2011    Past Surgical History  Procedure Date  . Biopsy on skin cancer   . Dilation and curettage of uterus 2009    Family History  Problem Relation Age of Onset  . Hypertension Mother   . Glaucoma Mother   . Irritable bowel syndrome Mother   . Arthritis Mother   . Kidney disease Mother   . Diabetes Mother   . Hypertension Father   . Heart disease Father     bypasses  . Cancer Father     sarcoma  . Cancer Maternal Grandmother     breast  . Heart disease Maternal Grandmother     CHF  . Stroke Maternal Grandmother     in his 25's  . Stroke Maternal Grandfather   . Other Paternal Grandmother     hardening of the arteries  . Heart attack Paternal Grandfather     History   Social History  . Marital Status: Married    Spouse Name: N/A    Number  of Children: N/A  . Years of Education: N/A   Occupational History  . Not on file.   Social History Main Topics  . Smoking status: Current Everyday Smoker -- 1.0 packs/day for 29 years    Types: Cigarettes  . Smokeless tobacco: Never Used  . Alcohol Use: Yes     occasionally  . Drug Use: No  . Sexually Active: Yes -- Female partner(s)   Other Topics Concern  . Not on file   Social History Narrative  . No narrative on file    Current Outpatient Prescriptions on File Prior to Visit  Medication Sig Dispense Refill  . albuterol (PROVENTIL HFA;VENTOLIN HFA) 108 (90 BASE) MCG/ACT inhaler Inhale 2 puffs into the lungs every 6 (six) hours as needed for wheezing.  1 Inhaler  0  . chlorpheniramine-hydrocodone (TUSSIONEX) 8-10 MG/5ML suspension Take 5 mLs by mouth every 12 (twelve) hours as needed for cough.  60 mL  1  . dextromethorphan-guaiFENesin (MUCINEX DM) 30-600 MG per 12 hr tablet Take 1 tablet by mouth every 12 (twelve) hours.          Allergies  Allergen Reactions  . Penicillins     Review of Systems  Review of Systems  Constitutional: Negative for fever and  malaise/fatigue.  HENT: Positive for congestion. Negative for sore throat.   Eyes: Negative for discharge.  Respiratory: Positive for cough, sputum production, shortness of breath and wheezing.   Cardiovascular: Negative for chest pain, palpitations and leg swelling.  Gastrointestinal: Negative for nausea, abdominal pain and diarrhea.  Genitourinary: Negative for dysuria.  Musculoskeletal: Negative for falls.  Skin: Negative for rash.  Neurological: Positive for headaches. Negative for loss of consciousness.  Endo/Heme/Allergies: Negative for polydipsia.  Psychiatric/Behavioral: Negative for depression and suicidal ideas. The patient is not nervous/anxious and does not have insomnia.     Objective  BP 143/86  Pulse 79  Temp(Src) 98.2 F (36.8 C) (Temporal)  Ht 5\' 4"  (1.626 m)  Wt 113 lb 12.8 oz (51.619 kg)   BMI 19.53 kg/m2  SpO2 94%  Physical Exam  Physical Exam  Constitutional: She is oriented to person, place, and time and well-developed, well-nourished, and in no distress. No distress.  HENT:  Head: Normocephalic and atraumatic.  Eyes: Conjunctivae are normal.  Neck: Neck supple. No thyromegaly present.  Cardiovascular: Normal rate, regular rhythm and normal heart sounds.   No murmur heard. Pulmonary/Chest: Effort normal. She has no wheezes.       RLL expiratory wheeze  Abdominal: She exhibits no distension and no mass.  Musculoskeletal: She exhibits no edema.  Lymphadenopathy:    She has no cervical adenopathy.  Neurological: She is alert and oriented to person, place, and time.  Skin: Skin is warm and dry. No rash noted. She is not diaphoretic.  Psychiatric: Memory, affect and judgment normal.    No results found for this basename: TSH   Lab Results  Component Value Date   WBC 8.7 11/15/2008   HGB 13.4 11/15/2008   HCT 40.8 11/15/2008   MCV 94.9 11/15/2008   PLT 192 11/15/2008   Lab Results  Component Value Date   CREATININE 0.79 11/15/2008   BUN 12 11/15/2008   NA 138 11/15/2008   K 3.7 11/15/2008   CL 102 11/15/2008   CO2 28 11/15/2008   Lab Results  Component Value Date   ALT 36* 11/15/2008   AST 29 11/15/2008   ALKPHOS 94 11/15/2008   BILITOT 0.3 11/15/2008      Assessment & Plan   Bronchitis, acute Patient prescribed antibiotics, steroids and tussionex. Encouraged to increase po fluids and rest, notify us if no improvement  TOBACCO ABUSE Patient once again encouraged numerous times to quit but patient noncommital

## 2011-05-05 NOTE — Patient Instructions (Signed)

## 2011-05-06 NOTE — Assessment & Plan Note (Signed)
Patient once again encouraged numerous times to quit but patient noncommital

## 2011-05-06 NOTE — Assessment & Plan Note (Signed)
Patient prescribed antibiotics, steroids and tussionex. Encouraged to increase po fluids and rest, notify us if no improvement

## 2011-05-14 ENCOUNTER — Ambulatory Visit (AMBULATORY_SURGERY_CENTER): Payer: BC Managed Care – PPO | Admitting: *Deleted

## 2011-05-14 VITALS — Ht 64.0 in | Wt 111.2 lb

## 2011-05-14 DIAGNOSIS — Z1211 Encounter for screening for malignant neoplasm of colon: Secondary | ICD-10-CM

## 2011-05-14 MED ORDER — PEG-KCL-NACL-NASULF-NA ASC-C 100 G PO SOLR: ORAL | Status: DC

## 2011-05-22 ENCOUNTER — Encounter: Payer: Self-pay | Admitting: Family Medicine

## 2011-05-22 ENCOUNTER — Ambulatory Visit (INDEPENDENT_AMBULATORY_CARE_PROVIDER_SITE_OTHER): Payer: BC Managed Care – PPO | Admitting: Family Medicine

## 2011-05-22 VITALS — BP 120/76 | HR 95 | Temp 99.6°F | Wt 113.0 lb

## 2011-05-22 DIAGNOSIS — J45909 Unspecified asthma, uncomplicated: Secondary | ICD-10-CM

## 2011-05-22 DIAGNOSIS — J019 Acute sinusitis, unspecified: Secondary | ICD-10-CM

## 2011-05-22 MED ORDER — PREDNISONE 20 MG PO TABS: ORAL_TABLET | ORAL | Status: DC

## 2011-05-22 MED ORDER — CEFUROXIME AXETIL 500 MG PO TABS: 500.0000 mg | ORAL_TABLET | Freq: Two times a day (BID) | ORAL | Status: AC

## 2011-05-22 MED ORDER — FLUTICASONE PROPIONATE 50 MCG/ACT NA SUSP: 2.0000 | Freq: Every day | NASAL | Status: DC

## 2011-05-22 NOTE — Progress Notes (Signed)
OFFICE NOTE  05/22/2011  CC: No chief complaint on file.    HPI: Patient is a 56 y.o. Caucasian female who is here for ongoing respiratory complaints. Pt presents complaining of respiratory symptoms for 20-30 days.  Mostly nasal congestion/runny nose, sneezing, and PND cough.  Worst symptoms seems to be the facial fullness/pressure, sinus HA.  Lately the symptoms seem to be worsening x 1 wk. Tm 99.  +intermittent wheezing/chest tightness/sob--responds well to ventolin, which she uses about once daily lately.  + pain in face and teeth diffusely--L>R.   ST mild at most.  Symptoms made worse by night time, cold weather.  Symptoms improved by resting. Smoker? yes Recent sick contact? yes Muscle or joint aches? no Flu shot this season at least 2 wks ago? yes  ROS: no n/v/d or abdominal pain.  No rash.  No neck stiffness.   +Mild fatigue.  +Mild appetite loss.   Pertinent PMH:  Tobacco dependence--ongoing COPD Hx of recurrent pneumonia  Past surgical, social, and family history reviewed and no changes noted since last office visit.  MEDS:  Outpatient Prescriptions Prior to Visit  Medication Sig Dispense Refill  . albuterol (PROVENTIL HFA;VENTOLIN HFA) 108 (90 BASE) MCG/ACT inhaler Inhale 2 puffs into the lungs every 6 (six) hours as needed for wheezing.  1 Inhaler  5  . dextromethorphan-guaiFENesin (MUCINEX DM) 30-600 MG per 12 hr tablet Take 1 tablet by mouth every 12 (twelve) hours.        . Multiple Vitamin (MULTIVITAMIN) tablet Take 1 tablet by mouth daily.      . peg 3350 powder (MOVIPREP) 100 G SOLR moviprep as directed  1 kit  0  . vitamin C (ASCORBIC ACID) 500 MG tablet Take 500 mg by mouth daily.        . chlorpheniramine-hydrocodone (TUSSIONEX) 8-10 MG/5ML suspension Take 5 mLs by mouth every 12 (twelve) hours as needed for cough.  60 mL  1  . predniSONE (DELTASONE) 20 MG tablet 2 tabs po daily x 5 days  10 tablet  0    PE: Blood pressure 120/76, pulse 95, temperature 99.6  F (37.6 C), weight 113 lb (51.256 kg), SpO2 91.00%. VS: noted--sat borderline. Gen: alert, NAD, NONTOXIC APPEARING. HEENT: eyes without injection, drainage, or swelling.  Ears: EACs clear, TMs with normal light reflex and landmarks.  Nose: Clear rhinorrhea, with some dried, crusty exudate adherent to mildly injected mucosa.  No purulent d/c.  No paranasal sinus TTP.  No facial swelling.  Throat and mouth without focal lesion.  No pharyngial swelling, erythema, or exudate.   Neck: supple, no LAD.   LUNGS: Breathing nonlabored. Diminished insp/exp BS diffusely, without crackles.  She has some coarse exp wheezing and prolonged exp phase with forced expiratory maneuver.   CV: RRR, no m/r/g. EXT: no c/c/e SKIN: no rash    IMPRESSION AND PLAN: Acute sinusitis and acute exacerbation of chronic bronchitis. Tobacco dependence.  Encouraged smoking cessation, pt not ready. Ceftin 500mg  bid x 10d. Prednisone taper: 60mg  qd x 5d, then 40mg  qdx 5d, then 20mg  qd x 5d. Add flonase, continue saline nasal spray. Continue tussionex prn: she thinks she has more at home but will call if she does not.  FOLLOW UP: 5d

## 2011-05-28 ENCOUNTER — Ambulatory Visit (INDEPENDENT_AMBULATORY_CARE_PROVIDER_SITE_OTHER): Payer: BC Managed Care – PPO | Admitting: Family Medicine

## 2011-05-28 ENCOUNTER — Encounter: Payer: Self-pay | Admitting: Family Medicine

## 2011-05-28 VITALS — BP 143/89 | HR 70 | Temp 99.5°F | Ht 64.0 in | Wt 117.4 lb

## 2011-05-28 DIAGNOSIS — F17200 Nicotine dependence, unspecified, uncomplicated: Secondary | ICD-10-CM

## 2011-05-28 DIAGNOSIS — J209 Acute bronchitis, unspecified: Secondary | ICD-10-CM

## 2011-05-28 MED ORDER — HYDROCOD POLST-CHLORPHEN POLST 10-8 MG/5ML PO LQCR: 5.0000 mL | Freq: Two times a day (BID) | ORAL | Status: DC | PRN

## 2011-05-28 MED ORDER — SULFAMETHOXAZOLE-TRIMETHOPRIM 800-160 MG PO TABS: 1.0000 | ORAL_TABLET | Freq: Two times a day (BID) | ORAL | Status: AC

## 2011-05-28 MED ORDER — BUDESONIDE 180 MCG/ACT IN AEPB: 1.0000 | INHALATION_SPRAY | Freq: Two times a day (BID) | RESPIRATORY_TRACT | Status: DC

## 2011-05-28 NOTE — Patient Instructions (Signed)
Bronchitis Bronchitis is the body's way of reacting to injury and/or infection (inflammation) of the bronchi. Bronchi are the air tubes that extend from the windpipe into the lungs. If the inflammation becomes severe, it may cause shortness of breath. CAUSES  Inflammation may be caused by:  A virus.   Germs (bacteria).   Dust.   Allergens.   Pollutants and many other irritants.  The cells lining the bronchial tree are covered with tiny hairs (cilia). These constantly beat upward, away from the lungs, toward the mouth. This keeps the lungs free of pollutants. When these cells become too irritated and are unable to do their job, mucus begins to develop. This causes the characteristic cough of bronchitis. The cough clears the lungs when the cilia are unable to do their job. Without either of these protective mechanisms, the mucus would settle in the lungs. Then you would develop pneumonia. Smoking is a common cause of bronchitis and can contribute to pneumonia. Stopping this habit is the single most important thing you can do to help yourself. TREATMENT   Your caregiver may prescribe an antibiotic if the cough is caused by bacteria. Also, medicines that open up your airways make it easier to breathe. Your caregiver may also recommend or prescribe an expectorant. It will loosen the mucus to be coughed up. Only take over-the-counter or prescription medicines for pain, discomfort, or fever as directed by your caregiver.   Removing whatever causes the problem (smoking, for example) is critical to preventing the problem from getting worse.   Cough suppressants may be prescribed for relief of cough symptoms.   Inhaled medicines may be prescribed to help with symptoms now and to help prevent problems from returning.   For those with recurrent (chronic) bronchitis, there may be a need for steroid medicines.  SEEK IMMEDIATE MEDICAL CARE IF:   During treatment, you develop more pus-like mucus  (purulent sputum).   You have a fever.   Your baby is older than 3 months with a rectal temperature of 102 F (38.9 C) or higher.   Your baby is 80 months old or younger with a rectal temperature of 100.4 F (38 C) or higher.   You become progressively more ill.   You have increased difficulty breathing, wheezing, or shortness of breath.  It is necessary to seek immediate medical care if you are elderly or sick from any other disease. MAKE SURE YOU:   Understand these instructions.   Will watch your condition.   Will get help right away if you are not doing well or get worse.  Document Released: 04/21/2005 Document Revised: 01/01/2011 Document Reviewed: 02/29/2008 [facility] Patient Information 2012 [place], [place]. Call if not improving for  Xray to be ordered

## 2011-05-29 ENCOUNTER — Ambulatory Visit (AMBULATORY_SURGERY_CENTER): Payer: BC Managed Care – PPO | Admitting: Internal Medicine

## 2011-05-29 ENCOUNTER — Encounter: Payer: Self-pay | Admitting: Internal Medicine

## 2011-05-29 ENCOUNTER — Encounter: Payer: Self-pay | Admitting: Family Medicine

## 2011-05-29 VITALS — BP 128/77 | HR 71 | Temp 96.9°F | Resp 17 | Ht 64.0 in | Wt 111.0 lb

## 2011-05-29 DIAGNOSIS — D126 Benign neoplasm of colon, unspecified: Secondary | ICD-10-CM

## 2011-05-29 DIAGNOSIS — Z1211 Encounter for screening for malignant neoplasm of colon: Secondary | ICD-10-CM

## 2011-05-29 MED ORDER — SODIUM CHLORIDE 0.9 % IV SOLN: 500.0000 mL | INTRAVENOUS | Status: DC

## 2011-05-29 NOTE — Op Note (Signed)
[facility] [address] [place], [place]  [address]  COLONOSCOPY PROCEDURE REPORT  PATIENT:  Kathleen, [name]  MR#:  [id number] BIRTHDATE:  07-Oct-1955, 55 yrs. old  GENDER:  female ENDOSCOPIST:  Iva Boop, MD, [facility] REF. BY:  Reuel Derby, M.D. PROCEDURE DATE:  05/29/2011 PROCEDURE:  Colonoscopy with snare polypectomy ASA CLASS:  Class II INDICATIONS:  Routine Risk Screening MEDICATIONS:   These medications were titrated to patient response per physician's verbal order, Fentanyl 75 mcg IV, Versed 9 mg IV  DESCRIPTION OF PROCEDURE:   After the risks benefits and alternatives of the procedure were thoroughly explained, informed consent was obtained.  Digital rectal exam was performed and revealed no abnormalities.   The LB [id number]-[id number] endoscope was introduced through the anus and advanced to the cecum, which was identified by both the appendix and ileocecal valve, without limitations.  The quality of the prep was good, using MoviPrep. The instrument was then slowly withdrawn as the colon was fully examined. <<PROCEDUREIMAGES>>  FINDINGS:  A pedunculated polyp (5-60mm) was found in the sigmoid colon. Polyp was snared, then cauterized with monopolar cautery. Retrieval was successful. snare polyp  This was otherwise a normal examination of the colon. Includes right colon retroflexion. Retroflexed views in the rectum revealed internal and external hemorrhoids.    The time to cecum = 8:32 minutes. The scope was then withdrawn in 11:59 minutes from the cecum and the procedure completed. COMPLICATIONS:  None ENDOSCOPIC IMPRESSION: 1) Pedunculated polyp in the sigmoid colon - removed 2) Internal and external hemorrhoids 3) Otherwise normal examination RECOMMENDATIONS: 1) Hold aspirin, aspirin products, and anti-inflammatory medication for 1 week. REPEAT EXAM:  In for Colonoscopy, pending biopsy results.  Iva Boop, MD, [name]  CC:  Reuel Derby, MD and The  Patient  n. eSIGNED:   Iva Boop at 05/29/2011 12:05 PM  Dub Amis, [id number]

## 2011-05-29 NOTE — Progress Notes (Signed)
[name], female   DOB: 1956/05/04, 56 y.o.   MRN: [id number] [name] [id number] June 30, 1955 05/29/2011      Progress Note-Follow Up  Subjective  Chief Complaint  Chief Complaint  Patient presents with  . Follow-up    5 day follow up    HPI  Patient is a 56 yo female in today for follow up on her bronchitis, she was in last week and was treated with antibiotics, she feels she is improving but not completely. She continues to cough, struggle with SOB and wheezing intermittently. Responds to Tussionex and Albuterol temporarily. No fevers, chills, malasie, her appetite is improving. She continues to smoke. No CP/palp/palp/HA/GI or GU c/o.  Past Medical History  Diagnosis Date  . Allergy     seasonal  . Chicken pox as a child  . Measles as a child  . Cancer     basal cell  . Bronchitis, acute 03/24/2011  . Preventative health care 03/24/2011    Past Surgical History  Procedure Date  . Biopsy on skin cancer   . Dilation and curettage of uterus 2009    Family History  Problem Relation Age of Onset  . Hypertension Mother   . Glaucoma Mother   . Irritable bowel syndrome Mother   . Arthritis Mother   . Kidney disease Mother   . Diabetes Mother   . Hypertension Father   . Heart disease Father     bypasses  . Cancer Father     sarcoma  . Cancer Maternal Grandmother     breast  . Heart disease Maternal Grandmother     CHF  . Stroke Maternal Grandmother     in his 55's  . Stroke Maternal Grandfather   . Other Paternal Grandmother     hardening of the arteries  . Heart attack Paternal Grandfather   . Colon cancer Paternal Aunt 47  . Stomach cancer Neg Hx     History   Social History  . Marital Status: Married    Spouse Name: N/A    Number of Children: N/A  . Years of Education: N/A   Occupational History  . Not on file.   Social History Main Topics  . Smoking status: Current Everyday Smoker -- 1.0 packs/day for 29 years    Types:  Cigarettes  . Smokeless tobacco: Never Used  . Alcohol Use: 2.4 oz/week    4 Glasses of wine per week  . Drug Use: No  . Sexually Active: Yes -- Female partner(s)   Other Topics Concern  . Not on file   Social History Narrative  . No narrative on file    Current Outpatient Prescriptions on File Prior to Visit  Medication Sig Dispense Refill  . albuterol (PROVENTIL HFA;VENTOLIN HFA) 108 (90 BASE) MCG/ACT inhaler Inhale 2 puffs into the lungs every 6 (six) hours as needed for wheezing.  1 Inhaler  5  . cefUROXime (CEFTIN) 500 MG tablet Take 1 tablet (500 mg total) by mouth 2 (two) times daily.  20 tablet  0  . dextromethorphan-guaiFENesin (MUCINEX DM) 30-600 MG per 12 hr tablet Take 1 tablet by mouth every 12 (twelve) hours.        . fluticasone (FLONASE) 50 MCG/ACT nasal spray Place 2 sprays into the nose daily.  16 g  6  . Multiple Vitamin (MULTIVITAMIN) tablet Take 1 tablet by mouth daily.      . predniSONE (DELTASONE) 20 MG tablet 3 tabs po qd x 5d, then  2 tabs po qd x 5d, then 1 tab po qd x 5d, then stop  30 tablet  0  . peg 3350 powder (MOVIPREP) 100 G SOLR moviprep as directed  1 kit  0    Allergies  Allergen Reactions  . Penicillins     Redness to skin    Review of Systems  Review of Systems  Constitutional: Positive for malaise/fatigue. Negative for fever.  HENT: Positive for congestion.   Eyes: Negative for discharge.  Respiratory: Positive for cough, shortness of breath and wheezing.   Cardiovascular: Negative for chest pain, palpitations and leg swelling.  Gastrointestinal: Negative for nausea, abdominal pain and diarrhea.  Genitourinary: Negative for dysuria.  Musculoskeletal: Negative for falls.  Skin: Negative for rash.  Neurological: Negative for loss of consciousness and headaches.  Endo/Heme/Allergies: Negative for polydipsia.  Psychiatric/Behavioral: Negative for depression and suicidal ideas. The patient is not nervous/anxious and does not have insomnia.       Objective  BP 143/89  Pulse 70  Temp(Src) 99.5 F (37.5 C) (Temporal)  Ht 5\' 4"  (1.626 m)  Wt 117 lb 6.4 oz (53.252 kg)  BMI 20.15 kg/m2  SpO2 97%  Physical Exam  Physical Exam  Constitutional: She is oriented to person, place, and time and well-developed, well-nourished, and in no distress. No distress.  HENT:  Head: Normocephalic and atraumatic.  Eyes: Conjunctivae are normal.  Neck: Neck supple. No thyromegaly present.  Cardiovascular: Normal rate, regular rhythm and normal heart sounds.   No murmur heard. Pulmonary/Chest: Effort normal. She has wheezes.  Abdominal: She exhibits no distension and no mass.  Musculoskeletal: She exhibits no edema.  Lymphadenopathy:    She has no cervical adenopathy.  Neurological: She is alert and oriented to person, place, and time.  Skin: Skin is warm and dry. No rash noted. She is not diaphoretic.  Psychiatric: Memory, affect and judgment normal.    No results found for this basename: TSH   Lab Results  Component Value Date   WBC 8.7 11/15/2008   HGB 13.4 11/15/2008   HCT 40.8 11/15/2008   MCV 94.9 11/15/2008   PLT 192 11/15/2008   Lab Results  Component Value Date   CREATININE 0.79 11/15/2008   BUN 12 11/15/2008   NA 138 11/15/2008   K 3.7 11/15/2008   CL 102 11/15/2008   CO2 28 11/15/2008   Lab Results  Component Value Date   ALT 36* 11/15/2008   AST 29 11/15/2008   ALKPHOS 94 11/15/2008   BILITOT 0.3 11/15/2008     Assessment & Plan   Bronchitis, acute Was seen last week and started on Ceftin, she is improving but does continue to struggle with cough, wheezing, SOB. Given a sample of Pulmicort inhaler to use 1 puff po bid, Continue Albuterol use prn. Given a refill of Tussionex to use prn, report worsening symptoms  TOBACCO ABUSE Encouraged complete cessation. Patient is noncommital

## 2011-05-29 NOTE — Progress Notes (Signed)
Patient did not experience any of the following events: a burn prior to discharge; a fall within the facility; wrong site/side/patient/procedure/implant event; or a hospital transfer or hospital admission upon discharge from the facility. (G8907) Patient did not have preoperative order for IV antibiotic SSI prophylaxis. (G8918)  

## 2011-05-29 NOTE — Patient Instructions (Addendum)
One small polyp was removed - it looks benign. A letter will be sent about the biopsy results. You also have some hemorrhoids - not ususally a major problem. Iva Boop, MD, [facility]   FOLLOW DISCHARGE INSTRUCTIONS (BLUE & GREEN SHEETS).

## 2011-05-29 NOTE — Progress Notes (Signed)
Pt remains on antibiotic & prednisone for sinus infection & ear infection. Dr Abner Greenspan aware of pt having colonoscopy today . Will notify Dr. Leone Payor. also

## 2011-05-30 ENCOUNTER — Telehealth: Payer: Self-pay

## 2011-05-30 NOTE — Telephone Encounter (Signed)

## 2011-06-01 NOTE — Assessment & Plan Note (Signed)
Was seen last week and started on Ceftin, she is improving but does continue to struggle with cough, wheezing, SOB. Given a sample of Pulmicort inhaler to use 1 puff po bid, Continue Albuterol use prn. Given a refill of Tussionex to use prn, report worsening symptoms

## 2011-06-01 NOTE — Assessment & Plan Note (Signed)
Encouraged complete cessation. Patient is noncommital

## 2011-06-03 ENCOUNTER — Encounter: Payer: Self-pay | Admitting: Internal Medicine

## 2011-06-03 NOTE — Progress Notes (Signed)
Quick Note:  Inflammatory polyp Routine repeat colonoscopy 10 years - approx 2023 ______

## 2011-06-20 ENCOUNTER — Encounter: Payer: Self-pay | Admitting: Family Medicine

## 2011-06-20 ENCOUNTER — Ambulatory Visit (INDEPENDENT_AMBULATORY_CARE_PROVIDER_SITE_OTHER): Payer: BC Managed Care – PPO | Admitting: Family Medicine

## 2011-06-20 VITALS — BP 125/79 | HR 104 | Temp 100.6°F | Wt 115.0 lb

## 2011-06-20 DIAGNOSIS — J209 Acute bronchitis, unspecified: Secondary | ICD-10-CM

## 2011-06-20 DIAGNOSIS — F17200 Nicotine dependence, unspecified, uncomplicated: Secondary | ICD-10-CM

## 2011-06-20 DIAGNOSIS — J42 Unspecified chronic bronchitis: Secondary | ICD-10-CM

## 2011-06-20 DIAGNOSIS — J069 Acute upper respiratory infection, unspecified: Secondary | ICD-10-CM

## 2011-06-20 MED ORDER — DOXYCYCLINE HYCLATE 100 MG PO CAPS: 100.0000 mg | ORAL_CAPSULE | Freq: Two times a day (BID) | ORAL | Status: DC

## 2011-06-20 MED ORDER — PREDNISONE 20 MG PO TABS: ORAL_TABLET | ORAL | Status: DC

## 2011-06-20 NOTE — Progress Notes (Signed)
OFFICE NOTE  06/20/2011  CC:  Chief Complaint  Patient presents with  . URI    fever, chills, chest hurts above left breast, began yesterday, green productive cough     HPI: Patient is a 56 y.o. Caucasian female who is here for cough/resp complaints. +Hx of COPD and ongoing tobacco dependence, also recurrent pneumonia. Got 10d of ceftin and 15d prednisone taper in mid January when I saw her last.  She felt [facility] BETTER after this, almost back to "baseline".  Last 2 days has felt left sided nasal congestion/mucous/facial pressure and mild pain on left, with increased cough and mucous production--sometimes greenish yellow. Has felt increased need for albuterol in last couple of days, but last time she used it was last evening about 5 pm.  +Fatigue/malaise.  No n/v/d.  ?subjective fever (chills, not rigors).  No temp checked. Continues to smoke but says she has cut back a little and continues to contemplate quitting but is not ready to act on this yet. She has been doing the pulmicort Dr. Abner Greenspan rx'd but has cut back to 1 puff qd instead of bid at her own discretion b/c she had been feeling better.   Pertinent PMH:  COPD Hx of recurrent pneumonia (3 times in 1 yr) Tobacco dependence  MEDS:  Outpatient Prescriptions Prior to Visit  Medication Sig Dispense Refill  . albuterol (PROVENTIL HFA;VENTOLIN HFA) 108 (90 BASE) MCG/ACT inhaler Inhale 2 puffs into the lungs every 6 (six) hours as needed for wheezing.  1 Inhaler  5  . dextromethorphan-guaiFENesin (MUCINEX DM) 30-600 MG per 12 hr tablet Take 1 tablet by mouth every 12 (twelve) hours.        . Multiple Vitamin (MULTIVITAMIN) tablet Take 1 tablet by mouth daily.      . budesonide (PULMICORT FLEXHALER) 180 MCG/ACT inhaler Inhale 1 puff into the lungs 2 (two) times daily.      . chlorpheniramine-HYDROcodone (TUSSIONEX PENNKINETIC ER) 10-8 MG/5ML LQCR Take 5 mLs by mouth every 12 (twelve) hours as needed.  140 mL  1  . fluticasone (FLONASE)  50 MCG/ACT nasal spray Place 2 sprays into the nose daily.  16 g  6  . predniSONE (DELTASONE) 20 MG tablet 3 tabs po qd x 5d, then 2 tabs po qd x 5d, then 1 tab po qd x 5d, then stop  30 tablet  0    PE: Blood pressure 125/79, pulse 104, temperature 100.6 F (38.1 C), temperature source Temporal, weight 115 lb (52.164 kg), SpO2 97.00%. VS: noted--normal. Gen: alert, NAD, appears well. HEENT: eyes without injection, drainage, or swelling.  Ears: EACs clear, TMs with normal light reflex and landmarks.  Nose: Moist nasal mucosa without significant injection or edema.   No purulent d/c.  No paranasal sinus TTP.  No facial swelling.  Throat and mouth without focal lesion.  No pharyngial swelling, erythema, or exudate.   Neck: supple, no LAD.   LUNGS: CTA bilat, nonlabored resps.  Exp phase is moderately prolonged but aeration is good.  No post-exhalation coughing.  No chest wall tenderness.  BS equal in all areas. CV: RRR, no m/r/g. EXT: no c/c/e SKIN: no rash    IMPRESSION AND PLAN: Acute exacerbation of chronic bronchitis: mild flare, with viral URI. Discussed potential treatments, including abx given her higher risk of bacterial bronchitis. Decided on doxycycline 100mg  bid x 10d, prednisone burst of 60mg  qd x 5d. Continue pulmicort but increase to 2 puffs bid while ill, then return to 1 puff bid  when she has returned to baseline.  She is resistant to step up therapy to things like advair or symbicort, spireva, or daliresp. Discussed smoking cessation.  Spent 3-10 minutes today discussing pt's smoking habit and the short and long term risks of smoking.  I clearly advised patient to completely quit smoking.   FOLLOW UP: prn

## 2011-06-25 ENCOUNTER — Ambulatory Visit: Payer: BC Managed Care – PPO | Admitting: Family Medicine

## 2011-06-25 ENCOUNTER — Ambulatory Visit: Payer: BC Managed Care – PPO

## 2011-06-25 DIAGNOSIS — Z0000 Encounter for general adult medical examination without abnormal findings: Secondary | ICD-10-CM

## 2011-06-25 LAB — CBC: MCHC: 32.5 g/dL (ref 30.0–36.0)

## 2011-06-25 LAB — LIPID PANEL
Cholesterol: 201 mg/dL — ABNORMAL HIGH (ref 0–200)
HDL: 105.8 mg/dL (ref >39.00)
VLDL: 13.8 mg/dL (ref 0.0–40.0)

## 2011-06-25 LAB — RENAL FUNCTION PANEL
Albumin: 3.8 g/dL (ref 3.5–5.2)
BUN: 16 mg/dL (ref 6–23)
Creatinine, Ser: 0.8 mg/dL (ref 0.4–1.2)
Glucose, Bld: 83 mg/dL (ref 70–99)
Phosphorus: 3.2 mg/dL (ref 2.3–4.6)
Potassium: 3.8 mEq/L (ref 3.5–5.1)

## 2011-06-25 LAB — HEPATIC FUNCTION PANEL
Albumin: 3.8 g/dL (ref 3.5–5.2)
Alkaline Phosphatase: 60 U/L (ref 39–117)

## 2011-06-26 ENCOUNTER — Ambulatory Visit (HOSPITAL_BASED_OUTPATIENT_CLINIC_OR_DEPARTMENT_OTHER)
Admission: RE | Admit: 2011-06-26 | Discharge: 2011-06-26 | Disposition: A | Discharge status: Home / Self Care | Payer: BC Managed Care – PPO | Source: Ambulatory Visit | Attending: Family Medicine | Admitting: Family Medicine

## 2011-06-26 ENCOUNTER — Ambulatory Visit (INDEPENDENT_AMBULATORY_CARE_PROVIDER_SITE_OTHER): Payer: BC Managed Care – PPO | Admitting: Family Medicine

## 2011-06-26 ENCOUNTER — Encounter: Payer: Self-pay | Admitting: Family Medicine

## 2011-06-26 VITALS — BP 115/78 | HR 93 | Temp 98.8°F | Ht 64.0 in | Wt 115.8 lb

## 2011-06-26 DIAGNOSIS — R05 Cough: Secondary | ICD-10-CM

## 2011-06-26 DIAGNOSIS — J40 Bronchitis, not specified as acute or chronic: Secondary | ICD-10-CM

## 2011-06-26 DIAGNOSIS — R059 Cough, unspecified: Secondary | ICD-10-CM

## 2011-06-26 DIAGNOSIS — J209 Acute bronchitis, unspecified: Secondary | ICD-10-CM

## 2011-06-26 DIAGNOSIS — F17200 Nicotine dependence, unspecified, uncomplicated: Secondary | ICD-10-CM

## 2011-06-26 DIAGNOSIS — J4489 Other specified chronic obstructive pulmonary disease: Secondary | ICD-10-CM | POA: Insufficient documentation

## 2011-06-26 DIAGNOSIS — J449 Chronic obstructive pulmonary disease, unspecified: Secondary | ICD-10-CM

## 2011-06-26 DIAGNOSIS — R0989 Other specified symptoms and signs involving the circulatory and respiratory systems: Secondary | ICD-10-CM

## 2011-06-26 DIAGNOSIS — R0602 Shortness of breath: Secondary | ICD-10-CM | POA: Insufficient documentation

## 2011-06-26 MED ORDER — PREDNISONE 20 MG PO TABS: ORAL_TABLET | ORAL | Status: DC

## 2011-06-26 MED ORDER — ALBUTEROL SULFATE (2.5 MG/3ML) 0.083% IN NEBU: 2.5000 mg | INHALATION_SOLUTION | Freq: Four times a day (QID) | RESPIRATORY_TRACT | Status: DC | PRN

## 2011-06-26 MED ORDER — CIPROFLOXACIN HCL 500 MG PO TABS: 500.0000 mg | ORAL_TABLET | Freq: Two times a day (BID) | ORAL | Status: AC

## 2011-06-26 NOTE — Patient Instructions (Signed)

## 2011-06-26 NOTE — Progress Notes (Signed)
[name], female   DOB: [birth year], 56 y.o.   MRN: [id number] [name] [id number] [birth year] 06/26/2011      Progress Note-Follow Up  Subjective  Chief Complaint  Chief Complaint  Patient presents with  . Fever    started antibiotic last Friday    HPI  Patient is a 56 year old Caucasian female who is in today complaining of worsening cough and malaise. She was in last week with a COPD exacerbation and started on doxycycline and steroids. While she was on the steroids she was doing much better and but then since coming off of the steroids her cough and shortness of breath worsened again. Last night she had trouble breathing with wheezing and had trouble resting overnight. She continues to show intermittent fevers chills myalgias shortness of breath low-grade headache. She denies any chest pain or palpitations. She does have some anorexia but denies any nausea vomiting or diarrhea. Continues to have ongoing nasal congestion with difficulty breathing out of the left side of her nose. She also notes some myalgias in her neck in the last 24-48 hours.  Past Medical History  Diagnosis Date  . Allergy     seasonal  . Chicken pox as a child  . Measles as a child  . Cancer     basal cell  . Bronchitis, acute 03/24/2011  . Preventative health care 03/24/2011    Past Surgical History  Procedure Date  . Biopsy on skin cancer   . Dilation and curettage of uterus 2009    Family History  Problem Relation Age of Onset  . Hypertension Mother   . Glaucoma Mother   . Irritable bowel syndrome Mother   . Arthritis Mother   . Kidney disease Mother   . Diabetes Mother   . Hypertension Father   . Heart disease Father     bypasses  . Cancer Father     sarcoma  . Cancer Maternal Grandmother     breast  . Heart disease Maternal Grandmother     CHF  . Stroke Maternal Grandmother     in his 67's  . Stroke Maternal Grandfather   . Other Paternal Grandmother    hardening of the arteries  . Heart attack Paternal Grandfather   . Colon cancer Paternal Aunt 35  . Stomach cancer Neg Hx     History   Social History  . Marital Status: Married    Spouse Name: N/A    Number of Children: N/A  . Years of Education: N/A   Occupational History  . Not on file.   Social History Main Topics  . Smoking status: Current Everyday Smoker -- 1.0 packs/day for 29 years    Types: Cigarettes  . Smokeless tobacco: Never Used  . Alcohol Use: 2.4 oz/week    4 Glasses of wine per week  . Drug Use: No  . Sexually Active: Yes -- Female partner(s)   Other Topics Concern  . Not on file   Social History Narrative  . No narrative on file    Current Outpatient Prescriptions on File Prior to Visit  Medication Sig Dispense Refill  . albuterol (PROVENTIL HFA;VENTOLIN HFA) 108 (90 BASE) MCG/ACT inhaler Inhale 2 puffs into the lungs every 6 (six) hours as needed for wheezing.  1 Inhaler  5  . budesonide (PULMICORT FLEXHALER) 180 MCG/ACT inhaler Inhale 1 puff into the lungs 2 (two) times daily.      [name] dextromethorphan-guaiFENesin (MUCINEX DM) 30-600 MG per 12 hr tablet  Take 1 tablet by mouth every 12 (twelve) hours.        . fluticasone (FLONASE) 50 MCG/ACT nasal spray Place 2 sprays into the nose daily.  16 g  6  . Multiple Vitamin (MULTIVITAMIN) tablet Take 1 tablet by mouth daily.        Allergies  Allergen Reactions  . Doxycycline     Couldn't breath  . Penicillins     Redness to skin    Review of Systems  Review of Systems  Constitutional: Positive for fever, chills and malaise/fatigue.  HENT: Positive for neck pain. Negative for congestion.   Eyes: Negative for discharge.  Respiratory: Positive for cough, shortness of breath and wheezing.   Cardiovascular: Negative for chest pain, palpitations and leg swelling.  Gastrointestinal: Negative for nausea, abdominal pain and diarrhea.  Genitourinary: Negative for dysuria.  Musculoskeletal: Positive for  myalgias. Negative for falls.  Skin: Negative for rash.  Neurological: Negative for loss of consciousness and headaches.  Endo/Heme/Allergies: Negative for polydipsia.  Psychiatric/Behavioral: Negative for depression and suicidal ideas. The patient is not nervous/anxious and does not have insomnia.     Objective  BP 115/78  Pulse 93  Temp(Src) 98.8 F (37.1 C) (Temporal)  Ht 5\' 4"  (1.626 m)  Wt 115 lb 12.8 oz (52.527 kg)  BMI 19.88 kg/m2  SpO2 90%  Physical Exam  Physical Exam  Constitutional: She is oriented to person, place, and time and well-developed, well-nourished, and in no distress. No distress.  HENT:  Head: Normocephalic and atraumatic.       Left TM is erythematous, retracted  Eyes: Conjunctivae are normal.  Neck: Normal range of motion. Neck supple. No thyromegaly present.  Cardiovascular: Normal rate, regular rhythm and normal heart sounds.   No murmur heard. Pulmonary/Chest: Effort normal and breath sounds normal. She has no wheezes.       Decreased BS b/l bases  Abdominal: She exhibits no distension and no mass.  Musculoskeletal: She exhibits no edema.  Lymphadenopathy:    She has cervical adenopathy.  Neurological: She is alert and oriented to person, place, and time.  Skin: Skin is warm and dry. No rash noted. She is not diaphoretic.  Psychiatric: Memory, affect and judgment normal.    Lab Results  Component Value Date   TSH 0.78 06/25/2011   Lab Results  Component Value Date   WBC 7.8 06/25/2011   HGB 13.3 06/25/2011   HCT 41.0 06/25/2011   MCV 98.2 06/25/2011   PLT 339.0 06/25/2011   Lab Results  Component Value Date   CREATININE 0.8 06/25/2011   BUN 16 06/25/2011   NA 138 06/25/2011   K 3.8 06/25/2011   CL 100 06/25/2011   CO2 28 06/25/2011   Lab Results  Component Value Date   ALT 24 06/25/2011   AST 23 06/25/2011   ALKPHOS 60 06/25/2011   BILITOT 0.2* 06/25/2011   Lab Results  Component Value Date   CHOL 201* 06/25/2011   Lab Results    Component Value Date   HDL 105.80 06/25/2011   No results found for this basename: [facility]   Lab Results  Component Value Date   TRIG 69.0 06/25/2011   Lab Results  Component Value Date   CHOLHDL 2 06/25/2011     Assessment & Plan  Bronchitis, acute X-ray confirms chronic changes of COPD. Her left ear is also noted to be erythematous the tympanic membrane is retracted. We will change her from doxycycline to ciprofloxacin twice daily. She started  back on a steroid taper and she is given albuterol for her nebulizer. She will call if symptoms worsen  TOBACCO ABUSE Patient is once again encouraged to quit smoking altogether. While she has cut down she is noncommittal about quitting completely. We will continue to monitor and offer guidance.

## 2011-06-26 NOTE — Assessment & Plan Note (Signed)
X-ray confirms chronic changes of COPD. Her left ear is also noted to be erythematous the tympanic membrane is retracted. We will change her from doxycycline to ciprofloxacin twice daily. She started back on a steroid taper and she is given albuterol for her nebulizer. She will call if symptoms worsen

## 2011-06-26 NOTE — Assessment & Plan Note (Signed)
Patient is once again encouraged to quit smoking altogether. While she has cut down she is noncommittal about quitting completely. We will continue to monitor and offer guidance.

## 2011-07-28 ENCOUNTER — Encounter: Payer: Self-pay | Admitting: Family Medicine

## 2011-09-15 ENCOUNTER — Ambulatory Visit (INDEPENDENT_AMBULATORY_CARE_PROVIDER_SITE_OTHER): Payer: BC Managed Care – PPO | Admitting: Family Medicine

## 2011-09-15 ENCOUNTER — Encounter: Payer: Self-pay | Admitting: Family Medicine

## 2011-09-15 VITALS — BP 119/80 | HR 105 | Temp 98.9°F | Ht 64.0 in | Wt 111.8 lb

## 2011-09-15 DIAGNOSIS — J40 Bronchitis, not specified as acute or chronic: Secondary | ICD-10-CM

## 2011-09-15 DIAGNOSIS — T7840XA Allergy, unspecified, initial encounter: Secondary | ICD-10-CM

## 2011-09-15 DIAGNOSIS — J209 Acute bronchitis, unspecified: Secondary | ICD-10-CM

## 2011-09-15 DIAGNOSIS — J029 Acute pharyngitis, unspecified: Secondary | ICD-10-CM

## 2011-09-15 MED ORDER — PREDNISONE 20 MG PO TABS: ORAL_TABLET | ORAL | Status: DC

## 2011-09-15 MED ORDER — CEFDINIR 300 MG PO CAPS: 300.0000 mg | ORAL_CAPSULE | Freq: Two times a day (BID) | ORAL | Status: DC

## 2011-09-15 MED ORDER — CETIRIZINE HCL 10 MG PO TABS: 10.0000 mg | ORAL_TABLET | Freq: Every day | ORAL | Status: DC | PRN

## 2011-09-15 MED ORDER — GUAIFENESIN ER 600 MG PO TB12: 1200.0000 mg | ORAL_TABLET | Freq: Two times a day (BID) | ORAL | Status: AC

## 2011-09-15 MED ORDER — SALINE NASAL SPRAY 0.65 % NA SOLN: 1.0000 | NASAL | Status: DC | PRN

## 2011-09-15 MED ORDER — HYDROCOD POLST-CHLORPHEN POLST 10-8 MG/5ML PO LQCR: 5.0000 mL | Freq: Two times a day (BID) | ORAL | Status: DC | PRN

## 2011-09-15 NOTE — Patient Instructions (Signed)

## 2011-09-21 NOTE — Progress Notes (Signed)
[name], female   DOB: 1956/02/02, 56 y.o.   MRN: [id number] [name] [id number] 06/26/1955 09/21/2011      Progress Note-Follow Up  Subjective  Chief Complaint  Chief Complaint  Patient presents with  . URI    sore throat, headache, coughing w/ phlegm (green), chest congestion X 4 days    HPI  Patient is a 56 year old who is in today with four-day history of worsening congestion. Sore throat, head congestion and chest congestion all noted. There is a low-grade frontal headache. Cough and nares are productive green phlegm. There's been no chest pain, palpitations but there have been some fevers and chills as well as some shortness of breath and ear discomfort no GI or GU complaints. Has tried some over-the-counter medications with only minimal improvement.  Past Medical History  Diagnosis Date  . Allergy     seasonal  . Chicken pox as a child  . Measles as a child  . Cancer     basal cell  . Bronchitis, acute 03/24/2011  . Preventative health care 03/24/2011    Past Surgical History  Procedure Date  . Biopsy on skin cancer   . Dilation and curettage of uterus 2009    Family History  Problem Relation Age of Onset  . Hypertension Mother   . Glaucoma Mother   . Irritable bowel syndrome Mother   . Arthritis Mother   . Kidney disease Mother   . Diabetes Mother   . Hypertension Father   . Heart disease Father     bypasses  . Cancer Father     sarcoma  . Cancer Maternal Grandmother     breast  . Heart disease Maternal Grandmother     CHF  . Stroke Maternal Grandmother     in his 66's  . Stroke Maternal Grandfather   . Other Paternal Grandmother     hardening of the arteries  . Heart attack Paternal Grandfather   . Colon cancer Paternal Aunt 81  . Stomach cancer Neg Hx     History   Social History  . Marital Status: Married    Spouse Name: N/A    Number of Children: N/A  . Years of Education: N/A   Occupational History  . Not on  file.   Social History Main Topics  . Smoking status: Current Everyday Smoker -- 1.0 packs/day for 29 years    Types: Cigarettes  . Smokeless tobacco: Never Used  . Alcohol Use: 2.4 oz/week    4 Glasses of wine per week  . Drug Use: No  . Sexually Active: Yes -- Female partner(s)   Other Topics Concern  . Not on file   Social History Narrative  . No narrative on file    Current Outpatient Prescriptions on File Prior to Visit  Medication Sig Dispense Refill  . albuterol (PROVENTIL HFA;VENTOLIN HFA) 108 (90 BASE) MCG/ACT inhaler Inhale 2 puffs into the lungs every 6 (six) hours as needed for wheezing.  1 Inhaler  5  . albuterol (PROVENTIL) (2.5 MG/3ML) 0.083% nebulizer solution Take 3 mLs (2.5 mg total) by nebulization every 6 (six) hours as needed for wheezing.  75 mL  12  . fluticasone (FLONASE) 50 MCG/ACT nasal spray Place 2 sprays into the nose daily.  16 g  6  . ibuprofen (ADVIL,MOTRIN) 100 MG tablet Take 100 mg by mouth every 6 (six) hours as needed.      . Multiple Vitamin (MULTIVITAMIN) tablet Take 1 tablet by mouth  daily.      . budesonide (PULMICORT FLEXHALER) 180 MCG/ACT inhaler Inhale 1 puff into the lungs 2 (two) times daily.      . cetirizine (ZYRTEC ALLERGY) 10 MG tablet Take 1 tablet (10 mg total) by mouth daily as needed for allergies or rhinitis.      . sodium chloride (AYR) 0.65 % nasal spray Place 1 spray into the nose as needed for congestion.  30 mL  12    Allergies  Allergen Reactions  . Doxycycline     Couldn't breath  . Penicillins     Redness to skin    Review of Systems  Review of Systems  Constitutional: Positive for fever, chills and malaise/fatigue.  HENT: Positive for ear pain, congestion and sore throat.   Eyes: Negative for discharge.  Respiratory: Positive for cough, sputum production and shortness of breath.   Cardiovascular: Negative for chest pain, palpitations and leg swelling.  Gastrointestinal: Negative for nausea, abdominal pain and  diarrhea.  Genitourinary: Negative for dysuria.  Musculoskeletal: Negative for falls.  Skin: Negative for rash.  Neurological: Positive for headaches. Negative for loss of consciousness.  Endo/Heme/Allergies: Negative for polydipsia.  Psychiatric/Behavioral: Negative for depression and suicidal ideas. The patient is not nervous/anxious and does not have insomnia.     Objective  BP 119/80  Pulse 105  Temp(Src) 98.9 F (37.2 C) (Temporal)  Ht 5\' 4"  (1.626 m)  Wt 111 lb 12.8 oz (50.712 kg)  BMI 19.19 kg/m2  SpO2 89%  Physical Exam  Physical Exam  Constitutional: She is well-developed, well-nourished, and in no distress. No distress.  HENT:  Left Ear: External ear normal.  Mouth/Throat: No oropharyngeal exudate.  Eyes: EOM are normal. Left eye exhibits no discharge. No scleral icterus.  Neck: No JVD present. No tracheal deviation present.  Cardiovascular: Normal heart sounds and intact distal pulses.   Pulmonary/Chest: No respiratory distress. She has wheezes. She has no rales.       B/l, expiratory wheeze.  Abdominal: She exhibits no distension and no mass. There is tenderness. There is no guarding.  Musculoskeletal: She exhibits no edema and no tenderness.  Lymphadenopathy:    She has no cervical adenopathy.  Skin: No rash noted. No erythema.    Lab Results  Component Value Date   TSH 0.78 06/25/2011   Lab Results  Component Value Date   WBC 7.8 06/25/2011   HGB 13.3 06/25/2011   HCT 41.0 06/25/2011   MCV 98.2 06/25/2011   PLT 339.0 06/25/2011   Lab Results  Component Value Date   CREATININE 0.8 06/25/2011   BUN 16 06/25/2011   NA 138 06/25/2011   K 3.8 06/25/2011   CL 100 06/25/2011   CO2 28 06/25/2011   Lab Results  Component Value Date   ALT 24 06/25/2011   AST 23 06/25/2011   ALKPHOS 60 06/25/2011   BILITOT 0.2* 06/25/2011   Lab Results  Component Value Date   CHOL 201* 06/25/2011   Lab Results  Component Value Date   HDL 105.80 06/25/2011   No results found  for this basename: [facility]   Lab Results  Component Value Date   TRIG 69.0 06/25/2011   Lab Results  Component Value Date   CHOLHDL 2 06/25/2011     Assessment & Plan  Allergic state Encouraged mucinex, nasal saline and previously prescribed Flonase.  Acute exacerbation of chronic bronchitis Given steroids, antibiotics and cough syrup. Increase rest and fluids

## 2011-09-21 NOTE — Assessment & Plan Note (Signed)
Given steroids, antibiotics and cough syrup. Increase rest and fluids

## 2011-09-21 NOTE — Assessment & Plan Note (Signed)
Encouraged mucinex, nasal saline and previously prescribed Flonase.

## 2011-10-06 ENCOUNTER — Telehealth: Payer: Self-pay

## 2011-10-06 NOTE — Telephone Encounter (Signed)
If she has had a fever in the past week we will call in Ciprofloxacin 250 mg po bid x 7 days. If not she should wait another week or so because a typical viral infection can last 3 weeks. If she worsens should come in for further examination

## 2011-10-06 NOTE — Telephone Encounter (Signed)
Patient left a message stating that she still has a sinus infection and sore throat after finishing antibiotic? Pt would like to know if a knew RX can be sent in to pharmacy? Please advise?

## 2011-10-06 NOTE — Telephone Encounter (Signed)
Patient informed and states she has not ran a fever.

## 2011-12-12 ENCOUNTER — Ambulatory Visit (INDEPENDENT_AMBULATORY_CARE_PROVIDER_SITE_OTHER): Payer: BC Managed Care – PPO | Admitting: Family Medicine

## 2011-12-12 ENCOUNTER — Encounter: Payer: Self-pay | Admitting: Family Medicine

## 2011-12-12 VITALS — BP 136/76 | HR 70 | Temp 97.1°F | Ht 64.0 in | Wt 113.8 lb

## 2011-12-12 DIAGNOSIS — F17200 Nicotine dependence, unspecified, uncomplicated: Secondary | ICD-10-CM

## 2011-12-12 DIAGNOSIS — L738 Other specified follicular disorders: Secondary | ICD-10-CM

## 2011-12-12 DIAGNOSIS — L739 Follicular disorder, unspecified: Secondary | ICD-10-CM

## 2011-12-12 MED ORDER — SULFAMETHOXAZOLE-TMP DS 800-160 MG PO TABS: 1.0000 | ORAL_TABLET | Freq: Two times a day (BID) | ORAL | Status: AC

## 2011-12-12 MED ORDER — BUPROPION HCL ER (XL) 150 MG PO TB24: 150.0000 mg | ORAL_TABLET | Freq: Every day | ORAL | Status: DC

## 2011-12-12 MED ORDER — BUPROPION HCL ER (XL) 300 MG PO TB24: 300.0000 mg | ORAL_TABLET | Freq: Every day | ORAL | Status: DC

## 2011-12-12 NOTE — Patient Instructions (Addendum)
Folliculitis  °Folliculitis is an infection and inflammation of the hair follicles. Hair follicles become red and irritated. This inflammation is usually caused by bacteria. The bacteria thrive in warm, moist environments. This condition can be seen anywhere on the body.  °CAUSES °The most common cause of folliculitis is an infection by germs (bacteria). Fungal and viral infections can also cause the condition. Viral infections may be more common in people whose bodies are unable to fight disease well (weakened immune systems). Examples include people with: °· AIDS.  °· An organ transplant.  °· Cancer.  °People with depressed immune systems, diabetes, or obesity, have a greater risk of getting folliculitis than the general population. Certain chemicals, especially oils and tars, also can cause folliculitis. °SYMPTOMS °· An early sign of folliculitis is a small, white or yellow pus-filled, itchy lesion (pustule). These lesions appear on a red, inflamed follicle. They are usually less than 5 mm (.20 inches).  °· The most likely starting points are the scalp, thighs, legs, back and buttocks. Folliculitis is also frequently found in areas of repeated shaving.  °· When an infection of the follicle goes deeper, it becomes a boil or furuncle. A group of closely packed boils create a larger lesion (a carbuncle). These sores (lesions) tend to occur in hairy, sweaty areas of the body.  °TREATMENT  °· A doctor who specializes in skin problems (dermatologists) treats mild cases of folliculitis with antiseptic washes.  °· They also use a skin application which kills germs (topical antibiotics). Tea tree oil is a good topical antiseptic as well. It can be found at a health food store. A small percentage of individuals may develop an allergy to the tea tree oil.  °· Mild to moderate boils respond well to warm water compresses applied three times daily.  °· In some cases, oral antibiotics should be taken with the skin treatment.    °· If lesions contain large quantities of pus or fluid, your caregiver may drain them. This allows the topical antibiotics to get to the affected areas better.  °· Stubborn cases of folliculitis may respond to laser hair removal. This process uses a high intensity light beam (a laser) to destroy the follicle and reduces the scarring from folliculitis. After laser hair removal, hair will no longer grow in the laser treated area.  °Patients with long-lasting folliculitis need to find out where the infection is coming from. Germs can live in the nostrils of the patient. This can trigger an outbreak now and then. Sometimes the bacteria live in the nostrils of a family member. This person does not develop the disorder but they repeatedly re-expose others to the germ. To break the cycle of recurrence in the patient, the family member must also undergo treatment. °PREVENTION  °· Individuals who are predisposed to folliculitis should be extremely careful about personal hygiene.  °· Application of antiseptic washes may help prevent recurrences.  °· A topical antibiotic cream, mupirocin (Bactroban®), has been effective at reducing bacteria in the nostrils. It is applied inside the nose with your little finger. This is done twice daily for a week. Then it is repeated every 6 months.  °· Because follicle disorders tend to come back, patients must receive follow-up care. Your caregiver may be able to recognize a recurrence before it becomes severe.  °SEEK IMMEDIATE MEDICAL CARE IF:  °· You develop redness, swelling, or increasing pain in the area.  °· You have a fever.  °· You are not improving with treatment   or are getting worse.  °· You have any other questions or concerns.  °Document Released: 06/30/2001 Document Revised: 04/10/2011 Document Reviewed: 04/26/2008 °[employer]® Patient Information ©2012 [employer]. °

## 2011-12-14 ENCOUNTER — Encounter: Payer: Self-pay | Admitting: Family Medicine

## 2011-12-14 DIAGNOSIS — L739 Follicular disorder, unspecified: Secondary | ICD-10-CM | POA: Insufficient documentation

## 2011-12-14 NOTE — Progress Notes (Signed)
[name], female   DOB: 24-Dec-1955, 56 y.o.   MRN: [id number] Ramani Riva [id number] [birth year] 12/14/2011      Progress Note-Follow Up  Subjective  Chief Complaint  Chief Complaint  Patient presents with  . infection in finger    from a splinter X 7 days- knuckle on left hand    HPI  Patient is a 56 year old Caucasian female who is in today to have a skin lesion evaluated. It is actually much better today for a couple days ago. She got a splinter over her second knuckle in her left hand in plaster. Became large red warm and ultimately she was able to open and a lot of pus came out. She was struggling with malaise, myalgias and some low-grade fevers. Since she was able to clean the lesion although symptoms have resolved the pain and swelling diminished greatly. She's not had any recent chest pain, palpitations, shortness of breath unfortunately continues to smoke.  Past Medical History  Diagnosis Date  . Allergy     seasonal  . Chicken pox as a child  . Measles as a child  . Cancer     basal cell  . Bronchitis, acute 03/24/2011  . Preventative health care 03/24/2011  . Folliculitis 12/14/2011    Past Surgical History  Procedure Date  . Biopsy on skin cancer   . Dilation and curettage of uterus 2009    Family History  Problem Relation Age of Onset  . Hypertension Mother   . Glaucoma Mother   . Irritable bowel syndrome Mother   . Arthritis Mother   . Kidney disease Mother   . Diabetes Mother   . Hypertension Father   . Heart disease Father     bypasses  . Cancer Father     sarcoma  . Cancer Maternal Grandmother     breast  . Heart disease Maternal Grandmother     CHF  . Stroke Maternal Grandmother     in his 42's  . Stroke Maternal Grandfather   . Other Paternal Grandmother     hardening of the arteries  . Heart attack Paternal Grandfather   . Colon cancer Paternal Aunt 24  . Stomach cancer Neg Hx     History   Social History  .  Marital Status: Married    Spouse Name: N/A    Number of Children: N/A  . Years of Education: N/A   Occupational History  . Not on file.   Social History Main Topics  . Smoking status: Current Everyday Smoker -- 1.0 packs/day for 29 years    Types: Cigarettes  . Smokeless tobacco: Never Used  . Alcohol Use: 2.4 oz/week    4 Glasses of wine per week  . Drug Use: No  . Sexually Active: Yes -- Female partner(s)   Other Topics Concern  . Not on file   Social History Narrative  . No narrative on file    Current Outpatient Prescriptions on File Prior to Visit  Medication Sig Dispense Refill  . albuterol (PROVENTIL HFA;VENTOLIN HFA) 108 (90 BASE) MCG/ACT inhaler Inhale 2 puffs into the lungs every 6 (six) hours as needed for wheezing.  1 Inhaler  5  . cetirizine (ZYRTEC ALLERGY) 10 MG tablet Take 1 tablet (10 mg total) by mouth daily as needed for allergies or rhinitis.      . fluticasone (FLONASE) 50 MCG/ACT nasal spray Place 2 sprays into the nose daily.  16 g  6  . guaiFENesin (MUCINEX)  600 MG 12 hr tablet Take 2 tablets (1,200 mg total) by mouth 2 (two) times daily.      [name] ibuprofen (ADVIL,MOTRIN) 100 MG tablet Take 100 mg by mouth every 6 (six) hours as needed.      . Multiple Vitamin (MULTIVITAMIN) tablet Take 1 tablet by mouth daily.      [name] albuterol (PROVENTIL) (2.5 MG/3ML) 0.083% nebulizer solution Take 3 mLs (2.5 mg total) by nebulization every 6 (six) hours as needed for wheezing.  75 mL  12  . buPROPion (WELLBUTRIN XL) 300 MG 24 hr tablet Take 1 tablet (300 mg total) by mouth daily.  30 tablet  3    Allergies  Allergen Reactions  . Doxycycline     Couldn't breath  . Penicillins     Redness to skin    Review of Systems  Review of Systems  Constitutional: Negative for fever and malaise/fatigue.  HENT: Negative for congestion.   Eyes: Negative for discharge.  Respiratory: Negative for shortness of breath.   Cardiovascular: Negative for chest pain, palpitations and  leg swelling.  Gastrointestinal: Negative for nausea, abdominal pain and diarrhea.  Genitourinary: Negative for dysuria.  Musculoskeletal: Negative for falls.  Skin: Negative for rash.       Sore on left 2nd knuckle, had malaise, fatigue, myalgias but that is improving. Did have a great deal of discharge but it is improved now  Neurological: Negative for loss of consciousness and headaches.  Endo/Heme/Allergies: Negative for polydipsia.  Psychiatric/Behavioral: Negative for depression and suicidal ideas. The patient is not nervous/anxious and does not have insomnia.     Objective  BP 136/76  Pulse 70  Temp 97.1 F (36.2 C) (Temporal)  Ht 5\' 4"  (1.626 m)  Wt 113 lb 12.8 oz (51.619 kg)  BMI 19.53 kg/m2  SpO2 100%  Physical Exam  Physical Exam  Constitutional: She is oriented to person, place, and time and well-developed, well-nourished, and in no distress. No distress.  HENT:  Head: Normocephalic and atraumatic.  Eyes: Conjunctivae are normal.  Neck: Neck supple. No thyromegaly present.  Cardiovascular: Normal rate, regular rhythm and normal heart sounds.   No murmur heard. Pulmonary/Chest: Effort normal and breath sounds normal. She has no wheezes.  Abdominal: She exhibits no distension and no mass.  Musculoskeletal: She exhibits no edema.  Lymphadenopathy:    She has no cervical adenopathy.  Neurological: She is alert and oriented to person, place, and time.  Skin: Skin is warm and dry. No rash noted. She is not diaphoretic. There is erythema.       Left knuckle raised, erythematous, scabbed. No discharge,   Psychiatric: Memory, affect and judgment normal.    Lab Results  Component Value Date   TSH 0.78 06/25/2011   Lab Results  Component Value Date   WBC 7.8 06/25/2011   HGB 13.3 06/25/2011   HCT 41.0 06/25/2011   MCV 98.2 06/25/2011   PLT 339.0 06/25/2011   Lab Results  Component Value Date   CREATININE 0.8 06/25/2011   BUN 16 06/25/2011   NA 138 06/25/2011   K  3.8 06/25/2011   CL 100 06/25/2011   CO2 28 06/25/2011   Lab Results  Component Value Date   ALT 24 06/25/2011   AST 23 06/25/2011   ALKPHOS 60 06/25/2011   BILITOT 0.2* 06/25/2011   Lab Results  Component Value Date   CHOL 201* 06/25/2011   Lab Results  Component Value Date   HDL 105.80 06/25/2011   No  results found for this basename: [facility]   Lab Results  Component Value Date   TRIG 69.0 06/25/2011   Lab Results  Component Value Date   CHOLHDL 2 06/25/2011     Assessment & Plan  Folliculitis Removed a splinter, is actually improving with patient cleaning with hydrogen peroxide and removal of splinter. She is encouraged to keep it clean and dry. Is given bactrim to use if symptoms worsen again start a probiotic, lesion is cultured.  TOBACCO ABUSE Is interested in trying to quit, is given an rx for Wellbutrin to see if this helps can consider nicotine replacement product as well

## 2011-12-14 NOTE — Assessment & Plan Note (Signed)
Is interested in trying to quit, is given an rx for Wellbutrin to see if this helps can consider nicotine replacement product as well

## 2011-12-14 NOTE — Assessment & Plan Note (Signed)
Removed a splinter, is actually improving with patient cleaning with hydrogen peroxide and removal of splinter. She is encouraged to keep it clean and dry. Is given bactrim to use if symptoms worsen again start a probiotic, lesion is cultured.

## 2011-12-15 LAB — WOUND CULTURE
Gram Stain: NONE SEEN
Gram Stain: NONE SEEN

## 2011-12-15 MED ORDER — MUPIROCIN 2 % EX OINT: TOPICAL_OINTMENT | Freq: Every day | CUTANEOUS | Status: DC

## 2011-12-15 NOTE — Addendum Note (Signed)
Addended by: Court Joy on: 12/15/2011 12:03 PM   Modules accepted: Orders

## 2011-12-31 ENCOUNTER — Ambulatory Visit (INDEPENDENT_AMBULATORY_CARE_PROVIDER_SITE_OTHER): Payer: BC Managed Care – PPO | Admitting: Family Medicine

## 2011-12-31 ENCOUNTER — Encounter: Payer: Self-pay | Admitting: Family Medicine

## 2011-12-31 VITALS — BP 137/81 | HR 91 | Temp 97.2°F | Ht 64.0 in | Wt 112.8 lb

## 2011-12-31 DIAGNOSIS — J209 Acute bronchitis, unspecified: Secondary | ICD-10-CM

## 2011-12-31 DIAGNOSIS — J029 Acute pharyngitis, unspecified: Secondary | ICD-10-CM

## 2011-12-31 DIAGNOSIS — J40 Bronchitis, not specified as acute or chronic: Secondary | ICD-10-CM

## 2011-12-31 DIAGNOSIS — T7840XA Allergy, unspecified, initial encounter: Secondary | ICD-10-CM

## 2011-12-31 DIAGNOSIS — J42 Unspecified chronic bronchitis: Secondary | ICD-10-CM

## 2011-12-31 MED ORDER — PREDNISONE 20 MG PO TABS: ORAL_TABLET | ORAL | Status: DC

## 2011-12-31 MED ORDER — METHYLPREDNISOLONE ACETATE 40 MG/ML IJ SUSP
40.0000 mg | Freq: Once | INTRAMUSCULAR | Status: AC
  Administered 2011-12-31: 40 mg via INTRAMUSCULAR

## 2011-12-31 MED ORDER — CEFDINIR 300 MG PO CAPS: 300.0000 mg | ORAL_CAPSULE | Freq: Two times a day (BID) | ORAL | Status: AC

## 2011-12-31 NOTE — Patient Instructions (Addendum)

## 2012-01-01 ENCOUNTER — Encounter: Payer: Self-pay | Admitting: Family Medicine

## 2012-01-01 NOTE — Assessment & Plan Note (Signed)
Continue flonase and Zyrtec

## 2012-01-01 NOTE — Assessment & Plan Note (Addendum)
Increase rest, given a steroid shot and then started on prednisone taper. Cefdinir bid. She is here today with her father and is hesitant to take the time for a cxr but if she does not improve with treatment she agrees to return to have one performed

## 2012-01-01 NOTE — Progress Notes (Signed)
[name], female   DOB: 06-06-1955, 56 y.o.   MRN: [id number] [name] [id number] January 26, 1956 01/01/2012      Progress Note-Follow Up  Subjective  Chief Complaint  Chief Complaint  Patient presents with  . URI    X 5 days- head congestion and cough- took 2 advil at 1:30    HPI  Patient is a 56 year old Caucasian female who is in today with recurrent respiratory symptoms. For the last couple of days she's been having significant albuterol. She's been coughing for roughly 5 days. She struggling with both head and chest congestion. She's having sputum production that is yellow. She has postnasal drip. Recurrent fevers and chills as well as malaise and myalgias are noted. She denies any GI upset such as diarrhea, nausea, anorexia. She denies chest pain palpitations. She does have some headache as well as ear pressure. She has been taking Advil and did take some this morning to control her discomfort and fevers.  Past Medical History  Diagnosis Date  . Allergy     seasonal  . Chicken pox as a child  . Measles as a child  . Cancer     basal cell  . Bronchitis, acute 03/24/2011  . Preventative health care 03/24/2011  . Folliculitis 12/14/2011    Past Surgical History  Procedure Date  . Biopsy on skin cancer   . Dilation and curettage of uterus 2009    Family History  Problem Relation Age of Onset  . Hypertension Mother   . Glaucoma Mother   . Irritable bowel syndrome Mother   . Arthritis Mother   . Kidney disease Mother   . Diabetes Mother   . Hypertension Father   . Heart disease Father     bypasses  . Cancer Father     sarcoma  . Cancer Maternal Grandmother     breast  . Heart disease Maternal Grandmother     CHF  . Stroke Maternal Grandmother     in his 61's  . Stroke Maternal Grandfather   . Other Paternal Grandmother     hardening of the arteries  . Heart attack Paternal Grandfather   . Colon cancer Paternal Aunt 88  . Stomach cancer Neg  Hx     History   Social History  . Marital Status: Married    Spouse Name: N/A    Number of Children: N/A  . Years of Education: N/A   Occupational History  . Not on file.   Social History Main Topics  . Smoking status: Current Everyday Smoker -- 1.0 packs/day for 29 years    Types: Cigarettes  . Smokeless tobacco: Never Used  . Alcohol Use: 2.4 oz/week    4 Glasses of wine per week  . Drug Use: No  . Sexually Active: Yes -- Female partner(s)   Other Topics Concern  . Not on file   Social History Narrative  . No narrative on file    Current Outpatient Prescriptions on File Prior to Visit  Medication Sig Dispense Refill  . albuterol (PROVENTIL HFA;VENTOLIN HFA) 108 (90 BASE) MCG/ACT inhaler Inhale 2 puffs into the lungs every 6 (six) hours as needed for wheezing.  1 Inhaler  5  . albuterol (PROVENTIL) (2.5 MG/3ML) 0.083% nebulizer solution Take 3 mLs (2.5 mg total) by nebulization every 6 (six) hours as needed for wheezing.  75 mL  12  . buPROPion (WELLBUTRIN XL) 150 MG 24 hr tablet Take 1 tablet (150 mg total) by mouth  daily.  7 tablet  0  . buPROPion (WELLBUTRIN XL) 300 MG 24 hr tablet Take 1 tablet (300 mg total) by mouth daily.  30 tablet  3  . cetirizine (ZYRTEC ALLERGY) 10 MG tablet Take 1 tablet (10 mg total) by mouth daily as needed for allergies or rhinitis.      . fluticasone (FLONASE) 50 MCG/ACT nasal spray Place 2 sprays into the nose daily.  16 g  6  . guaiFENesin (MUCINEX) 600 MG 12 hr tablet Take 2 tablets (1,200 mg total) by mouth 2 (two) times daily.      [name] ibuprofen (ADVIL,MOTRIN) 100 MG tablet Take 100 mg by mouth every 6 (six) hours as needed.      . Multiple Vitamin (MULTIVITAMIN) tablet Take 1 tablet by mouth daily.       No current facility-administered medications on file prior to visit.    Allergies  Allergen Reactions  . Doxycycline     Couldn't breath  . Penicillins     Redness to skin    Review of Systems  Review of Systems    Constitutional: Positive for fever, chills and malaise/fatigue.  HENT: Positive for congestion.   Eyes: Negative for discharge.  Respiratory: Positive for cough, sputum production, shortness of breath and wheezing.   Cardiovascular: Negative for chest pain, palpitations and leg swelling.  Gastrointestinal: Negative for nausea, abdominal pain and diarrhea.  Genitourinary: Negative for dysuria.  Musculoskeletal: Negative for falls.  Skin: Negative for rash.  Neurological: Positive for headaches. Negative for loss of consciousness.  Endo/Heme/Allergies: Negative for polydipsia.  Psychiatric/Behavioral: Negative for depression and suicidal ideas. The patient is not nervous/anxious and does not have insomnia.     Objective  BP 137/81  Pulse 91  Temp 97.2 F (36.2 C) (Temporal)  Ht 5\' 4"  (1.626 m)  Wt 112 lb 12.8 oz (51.166 kg)  BMI 19.36 kg/m2  SpO2 94%  Physical Exam  Physical Exam  Constitutional: She is oriented to person, place, and time and well-developed, well-nourished, and in no distress. No distress.  HENT:  Head: Normocephalic and atraumatic.  Eyes: Conjunctivae are normal.  Neck: Neck supple. No thyromegaly present.  Cardiovascular: Normal rate, regular rhythm and normal heart sounds.   No murmur heard. Pulmonary/Chest: Effort normal. No respiratory distress. She has wheezes. She has no rales. She exhibits no tenderness.       Diffuse expiratory  Abdominal: She exhibits no distension and no mass.  Musculoskeletal: She exhibits no edema.  Lymphadenopathy:    She has no cervical adenopathy.  Neurological: She is alert and oriented to person, place, and time.  Skin: Skin is warm and dry. No rash noted. She is not diaphoretic.  Psychiatric: Memory, affect and judgment normal.    Lab Results  Component Value Date   TSH 0.78 06/25/2011   Lab Results  Component Value Date   WBC 7.8 06/25/2011   HGB 13.3 06/25/2011   HCT 41.0 06/25/2011   MCV 98.2 06/25/2011   PLT  339.0 06/25/2011   Lab Results  Component Value Date   CREATININE 0.8 06/25/2011   BUN 16 06/25/2011   NA 138 06/25/2011   K 3.8 06/25/2011   CL 100 06/25/2011   CO2 28 06/25/2011   Lab Results  Component Value Date   ALT 24 06/25/2011   AST 23 06/25/2011   ALKPHOS 60 06/25/2011   BILITOT 0.2* 06/25/2011   Lab Results  Component Value Date   CHOL 201* 06/25/2011   Lab Results  Component Value Date   HDL 105.80 06/25/2011   No results found for this basename: [facility]   Lab Results  Component Value Date   TRIG 69.0 06/25/2011   Lab Results  Component Value Date   CHOLHDL 2 06/25/2011     Assessment & Plan  Acute exacerbation of chronic bronchitis Increase rest, given a steroid shot and then started on prednisone taper. Cefdinir bid. She is here today with her father and is hesitant to take the time for a cxr but if she does not improve with treatment she agrees to return to have one performed  Allergic state Continue flonase and Zyrtec

## 2012-01-07 ENCOUNTER — Encounter: Payer: Self-pay | Admitting: Family Medicine

## 2012-01-07 MED ORDER — AZITHROMYCIN 250 MG PO TABS: ORAL_TABLET | ORAL | Status: AC

## 2012-01-07 NOTE — Telephone Encounter (Signed)
Please advise mychart question? 

## 2012-01-23 ENCOUNTER — Ambulatory Visit (INDEPENDENT_AMBULATORY_CARE_PROVIDER_SITE_OTHER): Payer: BC Managed Care – PPO | Admitting: Family Medicine

## 2012-01-23 ENCOUNTER — Ambulatory Visit (INDEPENDENT_AMBULATORY_CARE_PROVIDER_SITE_OTHER)
Admission: RE | Admit: 2012-01-23 | Discharge: 2012-01-23 | Disposition: A | Discharge status: Home / Self Care | Payer: BC Managed Care – PPO | Source: Ambulatory Visit | Attending: Family Medicine | Admitting: Family Medicine

## 2012-01-23 ENCOUNTER — Encounter: Payer: Self-pay | Admitting: Family Medicine

## 2012-01-23 ENCOUNTER — Other Ambulatory Visit: Payer: Self-pay | Admitting: Family Medicine

## 2012-01-23 VITALS — BP 144/82 | HR 83 | Temp 98.3°F | Ht 64.0 in | Wt 111.8 lb

## 2012-01-23 DIAGNOSIS — R05 Cough: Secondary | ICD-10-CM

## 2012-01-23 DIAGNOSIS — J42 Unspecified chronic bronchitis: Secondary | ICD-10-CM

## 2012-01-23 DIAGNOSIS — R059 Cough, unspecified: Secondary | ICD-10-CM

## 2012-01-23 DIAGNOSIS — J019 Acute sinusitis, unspecified: Secondary | ICD-10-CM

## 2012-01-23 DIAGNOSIS — J441 Chronic obstructive pulmonary disease with (acute) exacerbation: Secondary | ICD-10-CM

## 2012-01-23 DIAGNOSIS — F17200 Nicotine dependence, unspecified, uncomplicated: Secondary | ICD-10-CM

## 2012-01-23 DIAGNOSIS — H8109 Meniere's disease, unspecified ear: Secondary | ICD-10-CM

## 2012-01-23 DIAGNOSIS — J40 Bronchitis, not specified as acute or chronic: Secondary | ICD-10-CM

## 2012-01-23 DIAGNOSIS — T7840XA Allergy, unspecified, initial encounter: Secondary | ICD-10-CM

## 2012-01-23 DIAGNOSIS — J209 Acute bronchitis, unspecified: Secondary | ICD-10-CM

## 2012-01-23 HISTORY — DX: Meniere's disease, unspecified ear: H81.09

## 2012-01-23 MED ORDER — BUDESONIDE-FORMOTEROL FUMARATE 80-4.5 MCG/ACT IN AERO: 2.0000 | INHALATION_SPRAY | Freq: Two times a day (BID) | RESPIRATORY_TRACT | Status: DC

## 2012-01-23 MED ORDER — CHLORTHALIDONE 25 MG PO TABS: 25.0000 mg | ORAL_TABLET | Freq: Every day | ORAL | Status: DC | PRN

## 2012-01-23 MED ORDER — HYDROCOD POLST-CHLORPHEN POLST 10-8 MG/5ML PO LQCR: 5.0000 mL | Freq: Two times a day (BID) | ORAL | Status: DC | PRN

## 2012-01-23 MED ORDER — LEVOFLOXACIN 500 MG PO TABS: 500.0000 mg | ORAL_TABLET | Freq: Every day | ORAL | Status: DC

## 2012-01-23 MED ORDER — PREDNISONE 20 MG PO TABS: 40.0000 mg | ORAL_TABLET | Freq: Every day | ORAL | Status: DC

## 2012-01-23 NOTE — Assessment & Plan Note (Signed)
Needs complete cessation 

## 2012-01-23 NOTE — Assessment & Plan Note (Signed)
Started on Levaquin x 21 days and asked to use her Cetirizine bid, Flonase daily and add nasal saline flushes, consider ENT referral if symptoms worsen or do not improve and may need CT sinuses as well

## 2012-01-23 NOTE — Assessment & Plan Note (Signed)
Zyrtec bid, Flonase and nasal saline

## 2012-01-23 NOTE — Progress Notes (Signed)
[name], female   DOB: 1955/07/24, 56 y.o.   MRN: [id number] [name] [id number] 1955-06-07 01/23/2012      Progress Note-Follow Up  Subjective  Chief Complaint  Chief Complaint  Patient presents with  . Sinusitis    head and chest congestion, cough w/ phlegm (yellow)    HPI  Patient is a Caucasian female who is in today with recurrent respiratory symptoms. She recently had 2 courses of antibiotics and after the second course of azithromycin was completed she did feel notably better but not fully better. Unfortunately since then her symptoms have worsened again. Typically her major congestion is in her chest but this time she is reporting her head has the most pressure and congestion. Low-grade malaise and a no obvious fevers or chills. She coughs sometimes dry sometimes productive. When it is productive yellow phlegm. She is wheezing some but not as much is usual albuterol is helpful when she needs it. She has seen ENT in the past for Mnire's disease as well as sinus congestion but it has been in many years. She previously was maintained on chlorthalidone intermittently for the Mnire's and had some helpful. Is almost out of her prescription she got years ago. No fevers or chills. Headache is diffuse a lot of frontal pressure is noted. Rhinorrhea is productive of yellow phlegm. No ear pain or sore throat, GI complaints palpitations or chest pain or noted.  Past Medical History  Diagnosis Date  . Allergy     seasonal  . Chicken pox as a child  . Measles as a child  . Cancer     basal cell  . Bronchitis, acute 03/24/2011  . Preventative health care 03/24/2011  . Folliculitis 12/14/2011  . Sinusitis acute 01/23/2012    Past Surgical History  Procedure Date  . Biopsy on skin cancer   . Dilation and curettage of uterus 2009    Family History  Problem Relation Age of Onset  . Hypertension Mother   . Glaucoma Mother   . Irritable bowel syndrome Mother   .  Arthritis Mother   . Kidney disease Mother   . Diabetes Mother   . Hypertension Father   . Heart disease Father     bypasses  . Cancer Father     sarcoma  . Cancer Maternal Grandmother     breast  . Heart disease Maternal Grandmother     CHF  . Stroke Maternal Grandmother     in his 32's  . Stroke Maternal Grandfather   . Other Paternal Grandmother     hardening of the arteries  . Heart attack Paternal Grandfather   . Colon cancer Paternal Aunt 57  . Stomach cancer Neg Hx     History   Social History  . Marital Status: Married    Spouse Name: N/A    Number of Children: N/A  . Years of Education: N/A   Occupational History  . Not on file.   Social History Main Topics  . Smoking status: Current Every Day Smoker -- 1.0 packs/day for 29 years    Types: Cigarettes  . Smokeless tobacco: Never Used  . Alcohol Use: 2.4 oz/week    4 Glasses of wine per week  . Drug Use: No  . Sexually Active: Yes -- Female partner(s)   Other Topics Concern  . Not on file   Social History Narrative  . No narrative on file    Current Outpatient Prescriptions on File Prior to Visit  Medication Sig Dispense Refill  . albuterol (PROVENTIL HFA;VENTOLIN HFA) 108 (90 BASE) MCG/ACT inhaler Inhale 2 puffs into the lungs every 6 (six) hours as needed for wheezing.  1 Inhaler  5  . albuterol (PROVENTIL) (2.5 MG/3ML) 0.083% nebulizer solution Take 3 mLs (2.5 mg total) by nebulization every 6 (six) hours as needed for wheezing.  75 mL  12  . cetirizine (ZYRTEC ALLERGY) 10 MG tablet Take 1 tablet (10 mg total) by mouth daily as needed for allergies or rhinitis.      . fluticasone (FLONASE) 50 MCG/ACT nasal spray Place 2 sprays into the nose daily.  16 g  6  . guaiFENesin (MUCINEX) 600 MG 12 hr tablet Take 2 tablets (1,200 mg total) by mouth 2 (two) times daily.      [name] ibuprofen (ADVIL,MOTRIN) 100 MG tablet Take 100 mg by mouth every 6 (six) hours as needed.      . Multiple Vitamin (MULTIVITAMIN) tablet  Take 1 tablet by mouth daily.      . budesonide-formoterol (SYMBICORT) 80-4.5 MCG/ACT inhaler Inhale 2 puffs into the lungs 2 (two) times daily.  2 Inhaler  0  . chlorthalidone (HYGROTON) 25 MG tablet Take 1 tablet (25 mg total) by mouth daily as needed.  30 tablet  2    Allergies  Allergen Reactions  . Doxycycline     Couldn't breath  . Penicillins     Redness to skin    Review of Systems  Review of Systems  Constitutional: Positive for malaise/fatigue. Negative for fever and chills.  HENT: Positive for congestion.   Eyes: Negative for discharge.  Respiratory: Positive for cough, sputum production and shortness of breath.   Cardiovascular: Negative for chest pain, palpitations and leg swelling.  Gastrointestinal: Negative for nausea, abdominal pain and diarrhea.  Genitourinary: Negative for dysuria.  Musculoskeletal: Negative for falls.  Skin: Negative for rash.  Neurological: Positive for headaches. Negative for loss of consciousness.  Endo/Heme/Allergies: Negative for polydipsia.  Psychiatric/Behavioral: Negative for depression and suicidal ideas. The patient is not nervous/anxious and does not have insomnia.     Objective  BP 144/82  Pulse 83  Temp 98.3 F (36.8 C) (Temporal)  Ht 5\' 4"  (1.626 m)  Wt 111 lb 12.8 oz (50.712 kg)  BMI 19.19 kg/m2  SpO2 94%  Physical Exam  Physical Exam  Constitutional: She is oriented to person, place, and time and well-developed, well-nourished, and in no distress. No distress.  HENT:  Head: Normocephalic and atraumatic.  Eyes: Conjunctivae normal are normal.  Neck: Neck supple. No thyromegaly present.  Cardiovascular: Normal rate, regular rhythm and normal heart sounds.   No murmur heard. Pulmonary/Chest: Effort normal. She has wheezes.       Expiratory wheezing b/l lower lobes  Abdominal: Soft. Bowel sounds are normal. She exhibits no distension and no mass.  Musculoskeletal: She exhibits no edema.  Lymphadenopathy:    She  has no cervical adenopathy.  Neurological: She is alert and oriented to person, place, and time.  Skin: Skin is warm and dry. No rash noted. She is not diaphoretic.  Psychiatric: Memory, affect and judgment normal.    Lab Results  Component Value Date   TSH 0.78 06/25/2011   Lab Results  Component Value Date   WBC 7.8 06/25/2011   HGB 13.3 06/25/2011   HCT 41.0 06/25/2011   MCV 98.2 06/25/2011   PLT 339.0 06/25/2011   Lab Results  Component Value Date   CREATININE 0.8 06/25/2011   BUN 16  06/25/2011   NA 138 06/25/2011   K 3.8 06/25/2011   CL 100 06/25/2011   CO2 28 06/25/2011   Lab Results  Component Value Date   ALT 24 06/25/2011   AST 23 06/25/2011   ALKPHOS 60 06/25/2011   BILITOT 0.2* 06/25/2011   Lab Results  Component Value Date   CHOL 201* 06/25/2011   Lab Results  Component Value Date   HDL 105.80 06/25/2011   No results found for this basename: LDLCALC   Lab Results  Component Value Date   TRIG 69.0 06/25/2011   Lab Results  Component Value Date   CHOLHDL 2 06/25/2011     Assessment & Plan  Sinusitis acute Started on Levaquin x 21 days and asked to use her Cetirizine bid, Flonase daily and add nasal saline flushes, consider ENT referral if symptoms worsen or do not improve and may need CT sinuses as well  Acute exacerbation of chronic bronchitis CXR today preliminary shows some consolidation in LLL. May require further imaging once radiology over reads. Patient aware. Due to frequency of flares patient is started on Symbicort 160/4.5 2 puffs po bid, 2 samples given. If no improvement is given a 5 days course of Prednisone 40 mg daily x 5 days  Allergic state Zyrtec bid, Flonase and nasal saline  TOBACCO ABUSE Needs complete cessation  Meniere disorder Has seen ENT in past and uses chlorthalidone 25 mg daily when necessary when she has a flare. This works well and she is given a refill today.

## 2012-01-23 NOTE — Assessment & Plan Note (Signed)
CXR today preliminary shows some consolidation in LLL. May require further imaging once radiology over reads. Patient aware. Due to frequency of flares patient is started on Symbicort 160/4.5 2 puffs po bid, 2 samples given. If no improvement is given a 5 days course of Prednisone 40 mg daily x 5 days

## 2012-01-23 NOTE — Assessment & Plan Note (Signed)
Has seen ENT in past and uses chlorthalidone 25 mg daily when necessary when she has a flare. This works well and she is given a refill today.

## 2012-01-23 NOTE — Patient Instructions (Addendum)

## 2012-01-26 NOTE — Progress Notes (Signed)
Quick Note:  Patient Informed and voiced understanding ______ 

## 2012-02-23 ENCOUNTER — Other Ambulatory Visit: Payer: Self-pay

## 2012-02-23 DIAGNOSIS — J40 Bronchitis, not specified as acute or chronic: Secondary | ICD-10-CM

## 2012-02-23 MED ORDER — ALBUTEROL SULFATE HFA 108 (90 BASE) MCG/ACT IN AERS: 2.0000 | INHALATION_SPRAY | Freq: Four times a day (QID) | RESPIRATORY_TRACT | Status: DC | PRN

## 2012-03-17 ENCOUNTER — Ambulatory Visit: Payer: BC Managed Care – PPO | Admitting: Family Medicine

## 2012-05-03 ENCOUNTER — Encounter: Payer: Self-pay | Admitting: Family Medicine

## 2012-05-03 ENCOUNTER — Ambulatory Visit (INDEPENDENT_AMBULATORY_CARE_PROVIDER_SITE_OTHER): Payer: BC Managed Care – PPO | Admitting: Family Medicine

## 2012-05-03 VITALS — BP 147/86 | HR 72 | Temp 99.8°F | Wt 116.0 lb

## 2012-05-03 DIAGNOSIS — N390 Urinary tract infection, site not specified: Secondary | ICD-10-CM

## 2012-05-03 DIAGNOSIS — R300 Dysuria: Secondary | ICD-10-CM

## 2012-05-03 LAB — POCT URINALYSIS DIPSTICK
Glucose, UA: NEGATIVE
Ketones, UA: NEGATIVE
Spec Grav, UA: 1.025

## 2012-05-03 MED ORDER — CIPROFLOXACIN HCL 500 MG PO TABS: 500.0000 mg | ORAL_TABLET | Freq: Two times a day (BID) | ORAL | Status: DC

## 2012-05-03 NOTE — Progress Notes (Signed)
OFFICE NOTE  05/03/2012  CC:  Chief Complaint  Patient presents with  . Urinary Tract Infection     HPI: Patient is a 56 y.o. Caucasian female who is here for suspicion of urinary infection. Describes 3-4 days of suprapubic soreness/pressure, esp after urinating.  Feels a distinct pain in urethral area after finishing emptying bladder.  Some urinary urgency/frequency.   No hematuria.  Started feeling left low back/flank pain in the last couple of days.  Mild nausea but no vomiting.  The flank pain does not radiate into groin. Subjective fever.  No vag d/c.  No diarrhea.  Pertinent PMH:  Past Medical History  Diagnosis Date  . Allergy     seasonal  . Chicken pox as a child  . Measles as a child  . Cancer     basal cell  . Bronchitis, acute 03/24/2011  . Preventative health care 03/24/2011  . Folliculitis 12/14/2011  . Sinusitis acute 01/23/2012  . Meniere disorder 01/23/2012    MEDS:  Outpatient Prescriptions Prior to Visit  Medication Sig Dispense Refill  . albuterol (PROVENTIL HFA;VENTOLIN HFA) 108 (90 BASE) MCG/ACT inhaler Inhale 2 puffs into the lungs every 6 (six) hours as needed for wheezing.  1 Inhaler  5  . Multiple Vitamin (MULTIVITAMIN) tablet Take 1 tablet by mouth daily.      [name] albuterol (PROVENTIL) (2.5 MG/3ML) 0.083% nebulizer solution Take 3 mLs (2.5 mg total) by nebulization every 6 (six) hours as needed for wheezing.  75 mL  12  . budesonide-formoterol (SYMBICORT) 80-4.5 MCG/ACT inhaler Inhale 2 puffs into the lungs 2 (two) times daily.  2 Inhaler  0  . cetirizine (ZYRTEC ALLERGY) 10 MG tablet Take 1 tablet (10 mg total) by mouth daily as needed for allergies or rhinitis.      . chlorpheniramine-HYDROcodone (TUSSIONEX PENNKINETIC ER) 10-8 MG/5ML LQCR Take 5 mLs by mouth every 12 (twelve) hours as needed.  140 mL  1  . chlorthalidone (HYGROTON) 25 MG tablet Take 1 tablet (25 mg total) by mouth daily as needed.  30 tablet  2  . fluticasone (FLONASE) 50 MCG/ACT  nasal spray Place 2 sprays into the nose daily.  16 g  6  . guaiFENesin (MUCINEX) 600 MG 12 hr tablet Take 2 tablets (1,200 mg total) by mouth 2 (two) times daily.      [name] ibuprofen (ADVIL,MOTRIN) 100 MG tablet Take 100 mg by mouth every 6 (six) hours as needed.      . [DISCONTINUED] levofloxacin (LEVAQUIN) 500 MG tablet Take 1 tablet (500 mg total) by mouth daily.  21 tablet  0  . [DISCONTINUED] predniSONE (DELTASONE) 20 MG tablet Take 2 tablets (40 mg total) by mouth daily.  10 tablet  0   Last reviewed on 05/03/2012 12:23 PM by Jeoffrey Massed, MD  PE: Blood pressure 147/86, pulse 72, temperature 99.8 F (37.7 C), weight 116 lb (52.617 kg). Gen: Alert, well appearing.  Patient is oriented to person, place, time, and situation. CV: RRR, no m/r/g.   LUNGS: CTA bilat, nonlabored resps, good aeration in all lung fields. ABD: suprapubic TTP, left flank TTP, left lower back TTP but NO CVA TTP.  LAB: CC UA today showed small LEU, nitrite positive, moderate blood, 30 mg/dl protein.  IMPRESSION AND PLAN:  UTI (lower urinary tract infection) Some typical features and some atypical. Will treat with cipro 500 mg bid x 5d. Send urine for c/s.   An After Visit Summary was printed and given to the  patient.  FOLLOW UP: prn

## 2012-05-05 DIAGNOSIS — N390 Urinary tract infection, site not specified: Secondary | ICD-10-CM | POA: Insufficient documentation

## 2012-05-05 NOTE — Assessment & Plan Note (Signed)
Some typical features and some atypical. Will treat with cipro 500 mg bid x 5d. Send urine for c/s.

## 2012-05-06 ENCOUNTER — Telehealth: Payer: Self-pay | Admitting: Family Medicine

## 2012-05-06 LAB — URINE CULTURE: Colony Count: >=100000

## 2012-05-06 MED ORDER — CEFUROXIME AXETIL 500 MG PO TABS: 500.0000 mg | ORAL_TABLET | Freq: Two times a day (BID) | ORAL | Status: DC

## 2012-05-06 NOTE — Telephone Encounter (Signed)
She feels like she is a little better but is still experiencing symptoms. Please advise.

## 2012-05-06 NOTE — Telephone Encounter (Signed)
Dr. Milinda Cave reviewed urine culture results.  I left a message on home voicemail advising pt of her results and of the need to pick up new antibiotic at the pharmacy.  Asked pt to please call our office tomorrow to let us know how she is doing.

## 2012-05-06 NOTE — Telephone Encounter (Signed)
Patient still experiencing pain in side & urinary frequency. She only has 3 pills left to take. She feels

## 2012-05-06 NOTE — Addendum Note (Signed)
Addended by: Jeoffrey Massed on: 05/06/2012 05:13 PM   Modules accepted: Orders

## 2012-05-06 NOTE — Telephone Encounter (Signed)
Patient called back & said that she feels like her bladder is better but that her kidneys still hurt.

## 2012-05-06 NOTE — Telephone Encounter (Signed)
Pt states Diane informed her of this information

## 2012-05-06 NOTE — Telephone Encounter (Signed)
So I do not have the sensitivity back on the urine culture yet. She should wait til tomorrow so by then I will know if she just needs a longer course of Ciprofloxacin or she needs to switch antibiotics, she did grow out a UTI with [facility] just need sensitivities to proceed, have her call tomorrow mid morning to let us know how she is doing and to find out the sensitivities

## 2012-05-18 ENCOUNTER — Telehealth: Payer: Self-pay | Admitting: Family Medicine

## 2012-05-18 ENCOUNTER — Encounter: Payer: Self-pay | Admitting: Family Medicine

## 2012-05-18 NOTE — Telephone Encounter (Signed)
Patient Information:  Caller Name: Rana  Phone: [phone]  Patient: [name], [name]  Gender: Female  DOB: [birth year]  Age: 57 Years  PCP: Danise Edge [facility])  Office Follow Up:  Does the office need to follow up with this patient?: No  Instructions For The Office: N/A   Symptoms  Reason For Call & Symptoms: Has frequency with urination since 1-13. Finished antibiotic for urinary tract infection 1-9.  Reviewed Health History In EMR: Yes  Reviewed Medications In EMR: Yes  Reviewed Allergies In EMR: Yes  Reviewed Surgeries / Procedures: Yes  Date of Onset of Symptoms: 05/17/2012  Treatments Tried: Motrin  Treatments Tried Worked: No  Guideline(s) Used:  Urination Pain - Female  Disposition Per Guideline:   Go to Office Now  Reason For Disposition Reached:   Side (flank) or lower back pain present  Advice Given:  Fluids:   Drink extra fluids. Drink 8-10 glasses of liquids a day (Reason: to produce a dilute, non-irritating urine).  Warm Saline SITZ Baths to Reduce Pain:  Sit in a warm saline bath for 20 minutes to cleanse the area and to reduce pain. Add 2 oz. of table salt or baking soda to a tub of water.  Appointment Scheduled:  05/19/2012 09:30:00 Appointment Scheduled Provider:  Danise Edge [facility])

## 2012-05-19 ENCOUNTER — Ambulatory Visit (INDEPENDENT_AMBULATORY_CARE_PROVIDER_SITE_OTHER): Payer: BC Managed Care – PPO | Admitting: Family Medicine

## 2012-05-19 ENCOUNTER — Encounter: Payer: Self-pay | Admitting: Family Medicine

## 2012-05-19 VITALS — BP 135/84 | HR 88 | Temp 98.9°F | Ht 64.0 in | Wt 114.8 lb

## 2012-05-19 DIAGNOSIS — R319 Hematuria, unspecified: Secondary | ICD-10-CM

## 2012-05-19 DIAGNOSIS — N390 Urinary tract infection, site not specified: Secondary | ICD-10-CM

## 2012-05-19 DIAGNOSIS — R300 Dysuria: Secondary | ICD-10-CM

## 2012-05-19 DIAGNOSIS — R309 Painful micturition, unspecified: Secondary | ICD-10-CM

## 2012-05-19 LAB — POCT URINALYSIS DIPSTICK
Spec Grav, UA: 1.025
Urobilinogen, UA: 0.2
pH, UA: 6

## 2012-05-19 MED ORDER — SULFAMETHOXAZOLE-TRIMETHOPRIM 800-160 MG PO TABS: 1.0000 | ORAL_TABLET | Freq: Two times a day (BID) | ORAL | Status: DC

## 2012-05-19 MED ORDER — PHENAZOPYRIDINE HCL 200 MG PO TABS: 200.0000 mg | ORAL_TABLET | Freq: Three times a day (TID) | ORAL | Status: DC | PRN

## 2012-05-19 NOTE — Assessment & Plan Note (Addendum)
Recurrent vs persistent. Urine sent for culture. Start on Bactrim DS 1 tab by mouth twice a day as her last infection was sensitive to this. Given for him to use when necessary encouraged to increase hydration and cranberry and notify us if symptoms do not resolve.

## 2012-05-19 NOTE — Patient Instructions (Addendum)
Urinary Tract Infection Urinary tract infections (UTIs) can develop anywhere along your urinary tract. Your urinary tract is your body's drainage system for removing wastes and extra water. Your urinary tract includes two kidneys, two ureters, a bladder, and a urethra. Your kidneys are a pair of bean-shaped organs. Each kidney is about the size of your fist. They are located below your ribs, one on each side of your spine. CAUSES Infections are caused by microbes, which are microscopic organisms, including fungi, viruses, and bacteria. These organisms are so small that they can only be seen through a microscope. Bacteria are the microbes that most commonly cause UTIs. SYMPTOMS  Symptoms of UTIs may vary by age and gender of the patient and by the location of the infection. Symptoms in young women typically include a frequent and intense urge to urinate and a painful, burning feeling in the bladder or urethra during urination. Older women and men are more likely to be tired, shaky, and weak and have muscle aches and abdominal pain. A fever may mean the infection is in your kidneys. Other symptoms of a kidney infection include pain in your back or sides below the ribs, nausea, and vomiting. DIAGNOSIS To diagnose a UTI, your caregiver will ask you about your symptoms. Your caregiver also will ask to provide a urine sample. The urine sample will be tested for bacteria and white blood cells. White blood cells are made by your body to help fight infection. TREATMENT  Typically, UTIs can be treated with medication. Because most UTIs are caused by a bacterial infection, they usually can be treated with the use of antibiotics. The choice of antibiotic and length of treatment depend on your symptoms and the type of bacteria causing your infection. HOME CARE INSTRUCTIONS  If you were prescribed antibiotics, take them exactly as your caregiver instructs you. Finish the medication even if you feel better after you  have only taken some of the medication.  Drink enough water and fluids to keep your urine clear or pale yellow.  Avoid caffeine, tea, and carbonated beverages. They tend to irritate your bladder.  Empty your bladder often. Avoid holding urine for long periods of time.  Empty your bladder before and after sexual intercourse.  After a bowel movement, women should cleanse from front to back. Use each tissue only once. SEEK MEDICAL CARE IF:   You have back pain.  You develop a fever.  Your symptoms do not begin to resolve within 3 days. SEEK IMMEDIATE MEDICAL CARE IF:   You have severe back pain or lower abdominal pain.  You develop chills.  You have nausea or vomiting.  You have continued burning or discomfort with urination. MAKE SURE YOU:   Understand these instructions.  Will watch your condition.  Will get help right away if you are not doing well or get worse. Document Released: 01/29/2005 Document Revised: 10/21/2011 Document Reviewed: 05/30/2011 [employer] Patient Information 2013 [employer].  

## 2012-05-19 NOTE — Telephone Encounter (Signed)
Pt is in today for an appt

## 2012-05-19 NOTE — Progress Notes (Signed)
[name], female   DOB: 01/21/1956, 57 y.o.   MRN: [id number] [name] [id number] Feb 13, 1956 05/19/2012      Progress Note-Follow Up  Subjective  Chief Complaint  Chief Complaint  Patient presents with  . Urinary Tract Infection    painful urination    HPI  Patient 57 we'll Caucasian female who is in today with recurrent complaints of dysuria. Late last month she was in with urinary symptoms including fevers chills and dysuria. Placed on Cipro. Urine culture showed Escherichia coli resistant to Cipro so switched to Ceftin she reports feeling better on the Ceftin but then as soon as the Ceftin stopped her symptoms worsen again. At present she is experiencing dysuria, urinary frequency and urgency. She has some suprapubic pain he notes a pain that radiates to her left flank at times. She has generalized malaise and myalgias as well as low-grade fevers and chills which history over-the-counter Tylenol and ibuprofen. She related history years ago thinking she passed a kidney stone. Was having recurrent urinary tract infections at that time and underwent a urologic workup. Ultimately passed a stone on her own and says that infections since stopped. If the symptoms are persistent or recurrent we may need to consider repeat urologic workup. No chest pain, palpitations, shortness of breath.  Past Medical History  Diagnosis Date  . Allergy     seasonal  . Chicken pox as a child  . Measles as a child  . Cancer     basal cell  . Bronchitis, acute 03/24/2011  . Preventative health care 03/24/2011  . Folliculitis 12/14/2011  . Sinusitis acute 01/23/2012  . Meniere disorder 01/23/2012    Past Surgical History  Procedure Date  . Biopsy on skin cancer   . Dilation and curettage of uterus 2009    Family History  Problem Relation Age of Onset  . Hypertension Mother   . Glaucoma Mother   . Irritable bowel syndrome Mother   . Arthritis Mother   . Kidney disease Mother   .  Diabetes Mother   . Hypertension Father   . Heart disease Father     bypasses  . Cancer Father     sarcoma  . Cancer Maternal Grandmother     breast  . Heart disease Maternal Grandmother     CHF  . Stroke Maternal Grandmother     in his 14's  . Stroke Maternal Grandfather   . Other Paternal Grandmother     hardening of the arteries  . Heart attack Paternal Grandfather   . Colon cancer Paternal Aunt 80  . Stomach cancer Neg Hx     History   Social History  . Marital Status: Married    Spouse Name: N/A    Number of Children: N/A  . Years of Education: N/A   Occupational History  . Not on file.   Social History Main Topics  . Smoking status: Current Every Day Smoker -- 1.0 packs/day for 29 years    Types: Cigarettes  . Smokeless tobacco: Never Used  . Alcohol Use: 2.4 oz/week    4 Glasses of wine per week  . Drug Use: No  . Sexually Active: Yes -- Female partner(s)   Other Topics Concern  . Not on file   Social History Narrative  . No narrative on file    Current Outpatient Prescriptions on File Prior to Visit  Medication Sig Dispense Refill  . albuterol (PROVENTIL HFA;VENTOLIN HFA) 108 (90 BASE) MCG/ACT inhaler Inhale  2 puffs into the lungs every 6 (six) hours as needed for wheezing.  1 Inhaler  5  . albuterol (PROVENTIL) (2.5 MG/3ML) 0.083% nebulizer solution Take 3 mLs (2.5 mg total) by nebulization every 6 (six) hours as needed for wheezing.  75 mL  12  . budesonide-formoterol (SYMBICORT) 80-4.5 MCG/ACT inhaler Inhale 2 puffs into the lungs 2 (two) times daily.  2 Inhaler  0  . cetirizine (ZYRTEC ALLERGY) 10 MG tablet Take 1 tablet (10 mg total) by mouth daily as needed for allergies or rhinitis.      . chlorthalidone (HYGROTON) 25 MG tablet Take 1 tablet (25 mg total) by mouth daily as needed.  30 tablet  2  . fluticasone (FLONASE) 50 MCG/ACT nasal spray Place 2 sprays into the nose daily.  16 g  6  . guaiFENesin (MUCINEX) 600 MG 12 hr tablet Take 2 tablets  (1,200 mg total) by mouth 2 (two) times daily.      [name] ibuprofen (ADVIL,MOTRIN) 100 MG tablet Take 100 mg by mouth every 6 (six) hours as needed.      . Multiple Vitamin (MULTIVITAMIN) tablet Take 1 tablet by mouth daily.      . chlorpheniramine-HYDROcodone (TUSSIONEX PENNKINETIC ER) 10-8 MG/5ML LQCR Take 5 mLs by mouth every 12 (twelve) hours as needed.  140 mL  1    Allergies  Allergen Reactions  . Penicillins     Redness to skin    Review of Systems  Review of Systems  Constitutional: Positive for fever, chills and malaise/fatigue.  HENT: Negative for congestion.   Eyes: Negative for discharge.  Respiratory: Negative for shortness of breath.   Cardiovascular: Negative for chest pain, palpitations and leg swelling.  Gastrointestinal: Positive for abdominal pain. Negative for nausea, vomiting, diarrhea, constipation and blood in stool.  Genitourinary: Positive for dysuria, urgency, frequency and flank pain. Negative for hematuria.  Musculoskeletal: Negative for falls.  Skin: Negative for rash.  Neurological: Negative for loss of consciousness and headaches.  Endo/Heme/Allergies: Negative for polydipsia.  Psychiatric/Behavioral: Negative for depression and suicidal ideas. The patient is not nervous/anxious and does not have insomnia.     Objective  BP 135/84  Pulse 88  Temp 98.9 F (37.2 C) (Temporal)  Ht 5\' 4"  (1.626 m)  Wt 114 lb 12.8 oz (52.073 kg)  BMI 19.71 kg/m2  SpO2 97%  Physical Exam  Physical Exam  Constitutional: She is oriented to person, place, and time and well-developed, well-nourished, and in no distress. No distress.  HENT:  Head: Normocephalic and atraumatic.  Eyes: Conjunctivae normal are normal.  Neck: Neck supple. No thyromegaly present.  Cardiovascular: Normal rate, regular rhythm and normal heart sounds.   No murmur heard. Pulmonary/Chest: Effort normal. She has no wheezes. She has rales.       rll rhonchi  Abdominal: Soft. Bowel sounds are  normal. She exhibits no distension and no mass. There is no tenderness. There is no rebound and no guarding.  Musculoskeletal: She exhibits no edema.  Lymphadenopathy:    She has no cervical adenopathy.  Neurological: She is alert and oriented to person, place, and time.  Skin: Skin is warm and dry. No rash noted. She is not diaphoretic.  Psychiatric: Memory, affect and judgment normal.    Lab Results  Component Value Date   TSH 0.78 06/25/2011   Lab Results  Component Value Date   WBC 7.8 06/25/2011   HGB 13.3 06/25/2011   HCT 41.0 06/25/2011   MCV 98.2 06/25/2011  PLT 339.0 06/25/2011   Lab Results  Component Value Date   CREATININE 0.8 06/25/2011   BUN 16 06/25/2011   NA 138 06/25/2011   K 3.8 06/25/2011   CL 100 06/25/2011   CO2 28 06/25/2011   Lab Results  Component Value Date   ALT 24 06/25/2011   AST 23 06/25/2011   ALKPHOS 60 06/25/2011   BILITOT 0.2* 06/25/2011   Lab Results  Component Value Date   CHOL 201* 06/25/2011   Lab Results  Component Value Date   HDL 105.80 06/25/2011   No results found for this basename: [facility]   Lab Results  Component Value Date   TRIG 69.0 06/25/2011   Lab Results  Component Value Date   CHOLHDL 2 06/25/2011     Assessment & Plan  UTI (lower urinary tract infection) Recurrent vs persistent. Urine sent for culture. Start on Bactrim DS 1 tab by mouth twice a day as her last infection was sensitive to this. Given for him to use when necessary encouraged to increase hydration and cranberry and notify us if symptoms do not resolve.

## 2012-05-24 ENCOUNTER — Other Ambulatory Visit: Payer: Self-pay | Admitting: Family Medicine

## 2012-06-19 ENCOUNTER — Other Ambulatory Visit: Payer: Self-pay

## 2012-07-08 ENCOUNTER — Ambulatory Visit (INDEPENDENT_AMBULATORY_CARE_PROVIDER_SITE_OTHER): Payer: BC Managed Care – PPO | Admitting: Family

## 2012-07-08 ENCOUNTER — Encounter: Payer: Self-pay | Admitting: Family

## 2012-07-08 ENCOUNTER — Ambulatory Visit (HOSPITAL_BASED_OUTPATIENT_CLINIC_OR_DEPARTMENT_OTHER)
Admission: RE | Admit: 2012-07-08 | Discharge: 2012-07-08 | Disposition: A | Discharge status: Home / Self Care | Payer: BC Managed Care – PPO | Source: Ambulatory Visit | Attending: Family | Admitting: Family

## 2012-07-08 VITALS — BP 134/78 | HR 73 | Temp 97.8°F | Resp 16 | Wt 116.0 lb

## 2012-07-08 DIAGNOSIS — R509 Fever, unspecified: Secondary | ICD-10-CM | POA: Insufficient documentation

## 2012-07-08 DIAGNOSIS — R05 Cough: Secondary | ICD-10-CM

## 2012-07-08 DIAGNOSIS — J209 Acute bronchitis, unspecified: Secondary | ICD-10-CM | POA: Insufficient documentation

## 2012-07-08 DIAGNOSIS — R059 Cough, unspecified: Secondary | ICD-10-CM | POA: Insufficient documentation

## 2012-07-08 DIAGNOSIS — F17200 Nicotine dependence, unspecified, uncomplicated: Secondary | ICD-10-CM

## 2012-07-08 DIAGNOSIS — J40 Bronchitis, not specified as acute or chronic: Secondary | ICD-10-CM

## 2012-07-08 MED ORDER — BENZONATATE 100 MG PO CAPS: 100.0000 mg | ORAL_CAPSULE | Freq: Three times a day (TID) | ORAL | Status: DC | PRN

## 2012-07-08 MED ORDER — AZITHROMYCIN 250 MG PO TABS: ORAL_TABLET | ORAL | Status: DC

## 2012-07-08 MED ORDER — ALBUTEROL SULFATE (2.5 MG/3ML) 0.083% IN NEBU: 2.5000 mg | INHALATION_SOLUTION | Freq: Four times a day (QID) | RESPIRATORY_TRACT | Status: DC | PRN

## 2012-07-08 NOTE — Patient Instructions (Addendum)
Please complete chest x ray on the first floor.  Call if symptoms worsen, or if not improved in 2-3 days.

## 2012-07-08 NOTE — Progress Notes (Signed)
Subjective:    [name], female    DOB: January 25, 1956, 57 y.o.   MRN: [id number]  HPI  Reports cough.  Started with a cold 3-4 weeks ago.   Cough is worse when she tried to sleep.  Perfumes make the cough worse.  Some nasal congestion. She is a current smoker.  She has tried mucinex, sudafed without improvement. She occasionally uses afrin.  She reports low grade sat and Sunday tmax 100.8.   Review of Systems See HPI  Past Medical History  Diagnosis Date  . Allergy     seasonal  . Chicken pox as a child  . Measles as a child  . Cancer     basal cell  . Bronchitis, acute 03/24/2011  . Preventative health care 03/24/2011  . Folliculitis 12/14/2011  . Sinusitis acute 01/23/2012  . Meniere disorder 01/23/2012    History   Social History  . Marital Status: Married    Spouse Name: N/A    Number of Children: N/A  . Years of Education: N/A   Occupational History  . Not on file.   Social History Main Topics  . Smoking status: Current Every Day Smoker -- 1.00 packs/day for 29 years    Types: Cigarettes  . Smokeless tobacco: Never Used  . Alcohol Use: 2.4 oz/week    4 Glasses of wine per week  . Drug Use: No  . Sexually Active: Yes -- Female partner(s)   Other Topics Concern  . Not on file   Social History Narrative  . No narrative on file    Past Surgical History  Procedure Laterality Date  . Biopsy on skin cancer    . Dilation and curettage of uterus  2009    Family History  Problem Relation Age of Onset  . Hypertension Mother   . Glaucoma Mother   . Irritable bowel syndrome Mother   . Arthritis Mother   . Kidney disease Mother   . Diabetes Mother   . Hypertension Father   . Heart disease Father     bypasses  . Cancer Father     sarcoma  . Cancer Maternal Grandmother     breast  . Heart disease Maternal Grandmother     CHF  . Stroke Maternal Grandmother     in his 42's  . Stroke Maternal Grandfather   . Other Paternal Grandmother    hardening of the arteries  . Heart attack Paternal Grandfather   . Colon cancer Paternal Aunt 44  . Stomach cancer Neg Hx     Allergies  Allergen Reactions  . Penicillins     Redness to skin    Current Outpatient Prescriptions on File Prior to Visit  Medication Sig Dispense Refill  . albuterol (PROVENTIL HFA;VENTOLIN HFA) 108 (90 BASE) MCG/ACT inhaler Inhale 2 puffs into the lungs every 6 (six) hours as needed for wheezing.  1 Inhaler  5  . albuterol (PROVENTIL) (2.5 MG/3ML) 0.083% nebulizer solution Take 3 mLs (2.5 mg total) by nebulization every 6 (six) hours as needed for wheezing.  75 mL  12  . cetirizine (ZYRTEC ALLERGY) 10 MG tablet Take 1 tablet (10 mg total) by mouth daily as needed for allergies or rhinitis.      . chlorthalidone (HYGROTON) 25 MG tablet Take 1 tablet (25 mg total) by mouth daily as needed.  30 tablet  2  . fluticasone (FLONASE) 50 MCG/ACT nasal spray USE 2 SPRAYS IN EACH NOSTRIL ONCE A DAY  16 g  1  . guaiFENesin (MUCINEX) 600 MG 12 hr tablet Take 2 tablets (1,200 mg total) by mouth 2 (two) times daily.      . Multiple Vitamin (MULTIVITAMIN) tablet Take 1 tablet by mouth daily.       No current facility-administered medications on file prior to visit.    BP 134/78  Pulse 73  Temp(Src) 97.8 F (36.6 C) (Oral)  Resp 16  Wt 116 lb 0.6 oz (52.635 kg)  BMI 19.91 kg/m2  SpO2 97%       Objective:   Physical Exam  Constitutional: She appears well-developed and well-nourished. No distress.  Cardiovascular: Normal rate and regular rhythm.   No murmur heard. Pulmonary/Chest: Effort normal. No respiratory distress.  Few coarse upper airway rhonchi noted.  Psychiatric: She has a normal mood and affect. Her behavior is normal. Judgment and thought content normal.          Assessment & Plan:

## 2012-07-08 NOTE — Assessment & Plan Note (Signed)
We discussed importance of smoking cessation 

## 2012-07-08 NOTE — Assessment & Plan Note (Signed)
CXR performed today is negative for PNA. Will rx with zithromax, albuterol, tessalon.

## 2012-07-13 ENCOUNTER — Encounter: Payer: Self-pay | Admitting: Family Medicine

## 2012-07-14 NOTE — Telephone Encounter (Signed)
Please advise 

## 2012-09-01 ENCOUNTER — Other Ambulatory Visit: Payer: Self-pay | Admitting: *Deleted

## 2012-09-01 ENCOUNTER — Telehealth: Payer: Self-pay | Admitting: Family Medicine

## 2012-09-01 ENCOUNTER — Encounter: Payer: Self-pay | Admitting: Family Medicine

## 2012-09-01 ENCOUNTER — Ambulatory Visit (INDEPENDENT_AMBULATORY_CARE_PROVIDER_SITE_OTHER): Payer: BC Managed Care – PPO | Admitting: Family Medicine

## 2012-09-01 VITALS — BP 127/82 | HR 82 | Temp 99.3°F | Resp 16 | Wt 117.8 lb

## 2012-09-01 DIAGNOSIS — J42 Unspecified chronic bronchitis: Secondary | ICD-10-CM

## 2012-09-01 DIAGNOSIS — T7840XA Allergy, unspecified, initial encounter: Secondary | ICD-10-CM

## 2012-09-01 DIAGNOSIS — J209 Acute bronchitis, unspecified: Secondary | ICD-10-CM

## 2012-09-01 MED ORDER — PREDNISONE 20 MG PO TABS: ORAL_TABLET | ORAL | Status: DC

## 2012-09-01 MED ORDER — MONTELUKAST SODIUM 10 MG PO TABS: 10.0000 mg | ORAL_TABLET | Freq: Every day | ORAL | Status: DC

## 2012-09-01 NOTE — Assessment & Plan Note (Signed)
Prednisone 40mg  qd x 5d, then 20mg  qd x 5d. Continue albut inhaler q4h prn. Strongly encouraged smoking cessation.

## 2012-09-01 NOTE — Progress Notes (Signed)
OFFICE NOTE  09/01/2012  CC:  Chief Complaint  Patient presents with  . Otalgia    Pt c/o right ear pain x2 days  . Sinusitis    Pt c/o head & chest congestion, non-productive cough, wheezing >1 wk     HPI: Patient is a 57 y.o. Caucasian female who is here for ear ache and other symptoms. Nasal congestion, facial fullness, wheezy/coughy, ear pain on and off for about 2+ wks.   Sx's all getting worse.  No fever.  Mucinex, sudafed, albut inhaler lately daily (sometimes bid). She is a smoker.  Has been outside in the pollen a lot lately.    Pertinent PMH:  Past Medical History  Diagnosis Date  . Allergy     seasonal  . Chicken pox as a child  . Measles as a child  . Cancer     basal cell  . Bronchitis, acute 03/24/2011  . Preventative health care 03/24/2011  . Folliculitis 12/14/2011  . Sinusitis acute 01/23/2012  . Meniere disorder 01/23/2012   Past Surgical History  Procedure Laterality Date  . Biopsy on skin cancer    . Dilation and curettage of uterus  2009     MEDS:  Outpatient Prescriptions Prior to Visit  Medication Sig Dispense Refill  . albuterol (PROVENTIL HFA;VENTOLIN HFA) 108 (90 BASE) MCG/ACT inhaler Inhale 2 puffs into the lungs every 6 (six) hours as needed for wheezing.  1 Inhaler  5  . albuterol (PROVENTIL) (2.5 MG/3ML) 0.083% nebulizer solution Take 3 mLs (2.5 mg total) by nebulization every 6 (six) hours as needed for wheezing.  75 mL  12  . cetirizine (ZYRTEC ALLERGY) 10 MG tablet Take 1 tablet (10 mg total) by mouth daily as needed for allergies or rhinitis.      . fluticasone (FLONASE) 50 MCG/ACT nasal spray USE 2 SPRAYS IN EACH NOSTRIL ONCE A DAY  16 g  1  . guaiFENesin (MUCINEX) 600 MG 12 hr tablet Take 2 tablets (1,200 mg total) by mouth 2 (two) times daily.      . Multiple Vitamin (MULTIVITAMIN) tablet Take 1 tablet by mouth daily.      [name] azithromycin (ZITHROMAX) 250 MG tablet 2 tabs by mouth today, then one tablet daily for 4 more days.  6  tablet  0  . benzonatate (TESSALON) 100 MG capsule Take 1 capsule (100 mg total) by mouth 3 (three) times daily as needed for cough.  20 capsule  0  . chlorthalidone (HYGROTON) 25 MG tablet Take 1 tablet (25 mg total) by mouth daily as needed.  30 tablet  2   No facility-administered medications prior to visit.    PE: Blood pressure 127/82, pulse 82, temperature 99.3 F (37.4 C), temperature source Oral, resp. rate 16, weight 117 lb 12 oz (53.411 kg), SpO2 95.00%. VS: noted--normal. Gen: alert, NAD, NONTOXIC APPEARING. HEENT: eyes without injection, drainage, or swelling.  Ears: EACs clear, TMs with normal light reflex and landmarks.  Nose: Clear rhinorrhea, with some dried, crusty exudate adherent to mildly injected mucosa.  No purulent d/c.  No paranasal sinus TTP.  No facial swelling.  Throat and mouth without focal lesion.  No pharyngial swelling, erythema, or exudate.   Neck: supple, no LAD.   LUNGS: CTA bilat, nonlabored resps.   CV: RRR, no m/r/g. EXT: no c/c/e SKIN: no rash    IMPRESSION AND PLAN:  Allergic state Add singulair 10mg  qd. Continue daily nonsedating antihistamine, inhaled steroid, and saline nasal spray.  Acute exacerbation  of chronic bronchitis Prednisone 40mg  qd x 5d, then 20mg  qd x 5d. Continue albut inhaler q4h prn. Strongly encouraged smoking cessation.   An After Visit Summary was printed and given to the patient.  FOLLOW UP: prn

## 2012-09-01 NOTE — Progress Notes (Signed)
Per pt, resend Rxs to [facility]; Done/SLS

## 2012-09-01 NOTE — Telephone Encounter (Signed)
Thank you for taking care of this.

## 2012-09-01 NOTE — Telephone Encounter (Signed)
Patient was seen in office today and her medications were sent to the wrong pharmacy, she needs them called to [facility], pt tried to call [facility] office but phones were off due to office meeting

## 2012-09-01 NOTE — Telephone Encounter (Signed)
Patient informed/SLS  

## 2012-09-01 NOTE — Assessment & Plan Note (Signed)
Add singulair 10mg  qd. Continue daily nonsedating antihistamine, inhaled steroid, and saline nasal spray.

## 2012-09-21 ENCOUNTER — Encounter: Payer: Self-pay | Admitting: Family Medicine

## 2012-09-21 MED ORDER — SULFAMETHOXAZOLE-TRIMETHOPRIM 800-160 MG PO TABS: 1.0000 | ORAL_TABLET | Freq: Two times a day (BID) | ORAL | Status: DC

## 2012-09-21 NOTE — Telephone Encounter (Signed)
Left a detailed message on answering machine. RX sent

## 2012-10-04 ENCOUNTER — Other Ambulatory Visit: Payer: Self-pay | Admitting: Family Medicine

## 2012-10-22 ENCOUNTER — Telehealth: Payer: Self-pay | Admitting: Family Medicine

## 2012-10-22 NOTE — Telephone Encounter (Signed)
Nikki from [facility] called stating that patient left message on nurse line stating that she thinks she has a uti and would like to know if she could come by to give urine sample?

## 2012-10-22 NOTE — Telephone Encounter (Signed)
Spoke with patient and she will go to [facility] [place] [facility] [phone]

## 2012-10-22 NOTE — Telephone Encounter (Signed)
Patient Information:  Caller Name: Monifah  Phone: [phone]  Patient: [name], [name]  Gender: Female  DOB: 04-19-1956  Age: 57 Years  PCP: Danise [facility])  Office Follow Up:  Does the office need to follow up with this patient?: Yes  Instructions For The Office: NEEDS WORK IN APPOINTMENT For symptoms of UTI- flank pain and dysuria, frequency   Symptoms  Reason For Call & Symptoms: Elicia states she thinks she is getting a bladdder and kidney infection. Having left flank pain and frequent urination onset 10/22/12. Discomfort when she urinates. Does not have thermometer. Has go to office now disposition due to flank pain- per urination pain protocol. PLEASE CALL TERRY AT [phone].  Reviewed Health History In EMR: Yes  Reviewed Medications In EMR: Yes  Reviewed Allergies In EMR: Yes  Reviewed Surgeries / Procedures: Yes  Date of Onset of Symptoms: 10/22/2012  Treatments Tried: fluids  Treatments Tried Worked: No  Guideline(s) Used:  Urination Pain - Female  Disposition Per Guideline:   Go to Office Now  Reason For Disposition Reached:   Side (flank) or lower back pain present  Advice Given:  Fluids:   Drink extra fluids. Drink 8-10 glasses of liquids a day (Reason: to produce a dilute, non-irritating urine).  Call Back If:   Fever lasts more than 24 hours on antibiotics  Pain does not improve by day 3 on antibiotics  Urine symptoms do not improve by day 3 on antibiotics  You become worse.  Warm Saline SITZ Baths to Reduce Pain:  Sit in a warm saline bath for 20 minutes to cleanse the area and to reduce pain. Add 2 oz. of table salt or baking soda to a tub of water.  Fluids:   Drink extra fluids. Drink 8-10 glasses of liquids a day (Reason: to produce a dilute, non-irritating urine).  Patient Will Follow Care Advice:  YES

## 2012-10-22 NOTE — Telephone Encounter (Signed)
[facility] will go to [facility] [place] office [[phone]]/SLS

## 2012-10-25 ENCOUNTER — Encounter: Payer: Self-pay | Admitting: Nurse Practitioner

## 2012-10-25 ENCOUNTER — Ambulatory Visit (INDEPENDENT_AMBULATORY_CARE_PROVIDER_SITE_OTHER): Payer: BC Managed Care – PPO | Admitting: Nurse Practitioner

## 2012-10-25 VITALS — BP 100/60 | HR 87 | Temp 98.4°F | Ht 64.0 in | Wt 113.2 lb

## 2012-10-25 DIAGNOSIS — N10 Acute tubulo-interstitial nephritis: Secondary | ICD-10-CM

## 2012-10-25 LAB — POCT URINALYSIS DIPSTICK
Glucose, UA: NEGATIVE
Nitrite, UA: NEGATIVE

## 2012-10-25 MED ORDER — SULFAMETHOXAZOLE-TMP DS 800-160 MG PO TABS: 1.0000 | ORAL_TABLET | Freq: Two times a day (BID) | ORAL | Status: DC

## 2012-10-25 NOTE — Progress Notes (Signed)
  Subjective:    [name], female    DOB: Sep 09, 1955, 57 y.o.   MRN: [id number]  Urinary Tract Infection  This is a new problem. The current episode started in the past 7 days (4 days ago). The problem occurs intermittently (has gotten better in last 2 days, since taking cranberry tabs.). The problem has been gradually worsening (flank pain worsening, fever persistent). The quality of the pain is described as aching. The pain is at a severity of 3/10. Maximum temperature: has had chills & sweating, has not checked fever. The fever has been present for 3 - 4 days. She is sexually active. There is a history of pyelonephritis. Associated symptoms include chills, flank pain (left), frequency and sweats. Pertinent negatives include no hematuria, nausea, urgency or vomiting. She has tried NSAIDs and acetaminophen for the symptoms. The treatment provided mild relief. Her past medical history is significant for kidney stones and recurrent UTIs.      Review of Systems  Constitutional: Positive for fever and chills. Negative for activity change, appetite change and fatigue.  Respiratory: Negative for shortness of breath and wheezing.   Cardiovascular: Negative for chest pain and leg swelling.  Gastrointestinal: Negative for nausea, vomiting and abdominal pain.  Genitourinary: Positive for frequency and flank pain (left). Negative for urgency and hematuria.  Musculoskeletal: Negative for arthralgias.       Objective:   Physical Exam  Vitals reviewed. Constitutional: She is oriented to person, place, and time. She appears well-developed and well-nourished. No distress.  HENT:  Head: Normocephalic and atraumatic.  Eyes: Conjunctivae are normal.  Cardiovascular: Normal rate.   Pulmonary/Chest: Effort normal.  Abdominal: Soft. Bowel sounds are normal. She exhibits no mass. There is tenderness (LUQ). There is no rebound and no guarding.  Musculoskeletal:  Normal gait  Neurological:  She is alert and oriented to person, place, and time.  Skin: Skin is warm and dry.  Psychiatric: She has a normal mood and affect. Her behavior is normal. Thought content normal.          Assessment & Plan:  1. Acute pyelonephritis Allergic to PCN, will not give rocephin in office. Will f/u in 2 days. - POCT urinalysis dipstick - Urine culture - sulfamethoxazole-trimethoprim (BACTRIM DS) 800-160 MG per tablet; Take 1 tablet by mouth every 12 (twelve) hours.  Dispense: 28 tablet; Refill: 0  See pt instruction.

## 2012-10-25 NOTE — Patient Instructions (Addendum)
Please use tylenol for fever and aches. Schedule follow up in 2 days. Drink 64 oz. Water daily.  Pyelonephritis, Adult Pyelonephritis is a kidney infection. In general, there are 2 main types of pyelonephritis:  Infections that come on quickly without any warning (acute pyelonephritis).  Infections that persist for a long period of time (chronic pyelonephritis). CAUSES  Two main causes of pyelonephritis are:  Bacteria traveling from the bladder to the kidney. This is a problem especially in pregnant women. The urine in the bladder can become filled with bacteria from multiple causes, including:  Inflammation of the prostate gland (prostatitis).  Sexual intercourse in females.  Bladder infection (cystitis).  Bacteria traveling from the bloodstream to the tissue part of the kidney. Problems that may increase your risk of getting a kidney infection include:  Diabetes.  Kidney stones or bladder stones.  Cancer.  Catheters placed in the bladder.  Other abnormalities of the kidney or ureter. SYMPTOMS   Abdominal pain.  Pain in the side or flank area.  Fever.  Chills.  Upset stomach.  Blood in the urine (dark urine).  Frequent urination.  Strong or persistent urge to urinate.  Burning or stinging when urinating. DIAGNOSIS  Your caregiver may diagnose your kidney infection based on your symptoms. A urine sample may also be taken. TREATMENT  In general, treatment depends on how severe the infection is.   If the infection is mild and caught early, your caregiver may treat you with oral antibiotics and send you home.  If the infection is more severe, the bacteria may have gotten into the bloodstream. This will require intravenous (IV) antibiotics and a hospital stay. Symptoms may include:  High fever.  Severe flank pain.  Shaking chills.  Even after a hospital stay, your caregiver may require you to be on oral antibiotics for a period of time.  Other treatments  may be required depending upon the cause of the infection. HOME CARE INSTRUCTIONS   Take your antibiotics as directed. Finish them even if you start to feel better.  Make an appointment to have your urine checked to make sure the infection is gone.  Drink enough fluids to keep your urine clear or pale yellow.  Take medicines for the bladder if you have urgency and frequency of urination as directed by your caregiver. SEEK IMMEDIATE MEDICAL CARE IF:   You have a fever or persistent symptoms for more than 2-3 days.  You have a fever and your symptoms suddenly get worse.  You are unable to take your antibiotics or fluids.  You develop shaking chills.  You experience extreme weakness or fainting.  There is no improvement after 2 days of treatment. MAKE SURE YOU:  Understand these instructions.  Will watch your condition.  Will get help right away if you are not doing well or get worse. Document Released: 04/21/2005 Document Revised: 10/21/2011 Document Reviewed: 09/25/2010 [facility] Patient Information 2014 [place], [place].

## 2012-10-27 ENCOUNTER — Ambulatory Visit (INDEPENDENT_AMBULATORY_CARE_PROVIDER_SITE_OTHER): Payer: BC Managed Care – PPO | Admitting: Nurse Practitioner

## 2012-10-27 ENCOUNTER — Telehealth: Payer: Self-pay | Admitting: *Deleted

## 2012-10-27 ENCOUNTER — Encounter: Payer: Self-pay | Admitting: Nurse Practitioner

## 2012-10-27 VITALS — BP 100/60 | HR 79 | Temp 98.0°F | Ht 64.0 in | Wt 114.2 lb

## 2012-10-27 DIAGNOSIS — L408 Other psoriasis: Secondary | ICD-10-CM

## 2012-10-27 DIAGNOSIS — N10 Acute tubulo-interstitial nephritis: Secondary | ICD-10-CM

## 2012-10-27 DIAGNOSIS — L409 Psoriasis, unspecified: Secondary | ICD-10-CM

## 2012-10-27 LAB — POCT URINALYSIS DIPSTICK
Nitrite, UA: NEGATIVE
Spec Grav, UA: 1.025
Urobilinogen, UA: 4
pH, UA: 6

## 2012-10-27 LAB — URINE CULTURE: Colony Count: >=100000

## 2012-10-27 MED ORDER — CEFTRIAXONE SODIUM 1 G IJ SOLR
1.0000 g | Freq: Once | INTRAMUSCULAR | Status: AC
  Administered 2012-10-27: 1 g via INTRAMUSCULAR

## 2012-10-27 MED ORDER — TRIAMCINOLONE ACETONIDE 0.1 % EX CREA
TOPICAL_CREAM | Freq: Two times a day (BID) | CUTANEOUS | Status: DC
Start: 2012-10-27 — End: 2013-05-04

## 2012-10-27 NOTE — Patient Instructions (Addendum)
Stop bactrim. We will use rocephin inj. Daily for next 3 days. Continue water intake. Call us if you have fever. For psoriasis on feet, start steroid cream.

## 2012-10-27 NOTE — Progress Notes (Signed)
Subjective:    [name] is a 57 y.o. female who presents for follow up of acute pyelonephritis. She continues to complain of left flank pain and pain in the LUQ and had chills last night in spite of starting on bactrim DS 2 days ago. She has now had 5 doses. She hs developed a splotchy red itchy rash on arms. She denies fever today.Patient does have a history of recurrent UTI. Her last infection was 5 mos ago when she had treatment failure with Cipro and had success with bactrim. Urine culture at that time grew e. Coli. Patient does have a history of pyelonephritis many years ago.  In addition she is concerned about a rash on bottoms of feet.  The following portions of the patient's history were reviewed and updated as appropriate: allergies, current medications, past medical history and problem list.  Review of Systems Constitutional: Pos. For chills last night Constitutional: negative for night sweats Gastrointestinal: mild LUQ pain Genitourinary:positive for L flank pain Integument/breast: negative, developed rash on arms in last 24 hours. Musculoskeletal:L flank pain   Skin cont'd: red icthy rash on bottoms of feet in arches, for about 6-7 mos. Objective:    BP 100/60  Pulse 79  Temp(Src) 98 F (36.7 C) (Oral)  Ht 5\' 4"  (1.626 m)  Wt 114 lb 4 oz (51.823 kg)  BMI 19.6 kg/m2  SpO2 95% General appearance: alert, cooperative and no distress Abdomen: tender LUQ, no masses or splenomegaly Musculoskeletal: L CVA tenderness  Skin: red splotchy raised rash on backs of hands and arms. Soles of feet have red flat rash in arches with large scale that can easily be pulled off. Scant bleeding occurs when scale pulled off.  Laboratory:  Urine dipstick: Large blood, 4.0 urobil, large leuks.   Micro exam: not done Urine Culture: Pos e. coli.    Assessment:  1. Acute pyelonephritis 2. Drug reaction 3.psoriasis     Plan:  1.1 gm rocephin IM today in office and qd for next 3 days (Will  meet pt in ofc on Sat for 4th injection) Monitored for 20 minutes after injection: no distress. Pt was told she has allergy to PCN 40 years ago  2. Stop bactrim 3. Steroid cream, if no improvement return

## 2012-10-27 NOTE — Addendum Note (Signed)
Addended by: Baldemar Lenis R on: 10/27/2012 02:53 PM   Modules accepted: Orders

## 2012-10-27 NOTE — Telephone Encounter (Signed)
Patient returned call and stated that she could come by office at 11:00 am Saturday morning. Layne advised.

## 2012-10-27 NOTE — Telephone Encounter (Signed)
Called patient and left message for her to return call. Patient needs to come in to office on Saturday for 4 th injection of ceftriaxone. [name] will meet patient here at office when it is convenint  for patient.

## 2012-10-28 ENCOUNTER — Ambulatory Visit (INDEPENDENT_AMBULATORY_CARE_PROVIDER_SITE_OTHER): Payer: BC Managed Care – PPO | Admitting: *Deleted

## 2012-10-28 VITALS — Temp 98.6°F

## 2012-10-28 DIAGNOSIS — N10 Acute tubulo-interstitial nephritis: Secondary | ICD-10-CM

## 2012-10-28 MED ORDER — CEFTRIAXONE SODIUM 1 G IJ SOLR
1.0000 g | INTRAMUSCULAR | Status: DC
  Administered 2012-10-28: 1 g via INTRAMUSCULAR

## 2012-10-28 NOTE — Patient Instructions (Signed)
Patient had taken tylenol 5 hours before office visit  today.

## 2012-10-29 ENCOUNTER — Ambulatory Visit (INDEPENDENT_AMBULATORY_CARE_PROVIDER_SITE_OTHER): Payer: BC Managed Care – PPO | Admitting: *Deleted

## 2012-10-29 DIAGNOSIS — N10 Acute tubulo-interstitial nephritis: Secondary | ICD-10-CM

## 2012-10-29 MED ORDER — CEFTRIAXONE SODIUM 1 G IJ SOLR
1.0000 g | INTRAMUSCULAR | Status: DC
  Administered 2012-10-29: 1 g via INTRAMUSCULAR

## 2012-10-30 ENCOUNTER — Ambulatory Visit (INDEPENDENT_AMBULATORY_CARE_PROVIDER_SITE_OTHER): Payer: BC Managed Care – PPO | Admitting: Nurse Practitioner

## 2012-10-30 DIAGNOSIS — N10 Acute tubulo-interstitial nephritis: Secondary | ICD-10-CM

## 2012-11-01 MED ORDER — CEFTRIAXONE SODIUM 1 G IJ SOLR
1.0000 g | INTRAMUSCULAR | Status: DC
  Administered 2012-10-30: 1 g via INTRAMUSCULAR

## 2013-01-17 ENCOUNTER — Other Ambulatory Visit: Payer: Self-pay | Admitting: Family Medicine

## 2013-03-03 ENCOUNTER — Ambulatory Visit (INDEPENDENT_AMBULATORY_CARE_PROVIDER_SITE_OTHER): Payer: BC Managed Care – PPO | Admitting: Nurse Practitioner

## 2013-03-03 ENCOUNTER — Encounter: Payer: Self-pay | Admitting: Nurse Practitioner

## 2013-03-03 VITALS — BP 120/60 | HR 71 | Temp 98.1°F | Resp 20 | Ht 65.0 in | Wt 117.0 lb

## 2013-03-03 DIAGNOSIS — J40 Bronchitis, not specified as acute or chronic: Secondary | ICD-10-CM

## 2013-03-03 DIAGNOSIS — J189 Pneumonia, unspecified organism: Secondary | ICD-10-CM

## 2013-03-03 DIAGNOSIS — B353 Tinea pedis: Secondary | ICD-10-CM

## 2013-03-03 MED ORDER — DOXYCYCLINE HYCLATE 100 MG PO TABS: 100.0000 mg | ORAL_TABLET | Freq: Two times a day (BID) | ORAL | Status: DC

## 2013-03-03 MED ORDER — ALBUTEROL SULFATE (2.5 MG/3ML) 0.083% IN NEBU: 2.5000 mg | INHALATION_SOLUTION | Freq: Four times a day (QID) | RESPIRATORY_TRACT | Status: DC | PRN

## 2013-03-03 MED ORDER — GUAIFENESIN 400 MG PO TABS: 400.0000 mg | ORAL_TABLET | Freq: Four times a day (QID) | ORAL | Status: DC | PRN

## 2013-03-03 NOTE — Progress Notes (Signed)
  Subjective:    [name], female    DOB: March 30, 1956, 57 y.o.   MRN: [id number]  HPI Comments: Steroid cream used in arches of feet made rash worse-crack & bleed.  Cough This is a recurrent problem. The current episode started 1 to 4 weeks ago (2 wks). The problem has been waxing and waning. The cough is productive of sputum. Associated symptoms include a fever (fever 6 dys ago), headaches, nasal congestion, postnasal drip and a sore throat. Pertinent negatives include no chest pain, chills, eye redness, shortness of breath or wheezing. She has tried nothing for the symptoms. Her past medical history is significant for COPD and pneumonia. current 1PPD smoker      Review of Systems  Constitutional: Positive for fever (fever 6 dys ago) and fatigue. Negative for chills and activity change.  HENT: Positive for congestion, postnasal drip and sore throat.   Eyes: Negative for redness.  Respiratory: Positive for cough. Negative for chest tightness, shortness of breath and wheezing.   Cardiovascular: Negative for chest pain, palpitations and leg swelling.  Gastrointestinal: Negative for nausea, abdominal pain and diarrhea.  Musculoskeletal: Negative for back pain.  Neurological: Positive for headaches. Negative for dizziness and light-headedness.  Hematological: Negative for adenopathy.       Objective:   Physical Exam  Vitals reviewed. Constitutional: She is oriented to person, place, and time. She appears well-developed and well-nourished. No distress.  HENT:  Head: Normocephalic and atraumatic.  Right Ear: External ear normal.  Left Ear: External ear normal.  Mouth/Throat: Oropharynx is clear and moist. No oropharyngeal exudate.  Eyes: Conjunctivae are normal. Right eye exhibits no discharge. Left eye exhibits no discharge.  Neck: Normal range of motion. Neck supple. No thyromegaly present.  Cardiovascular: Normal rate, regular rhythm and normal heart sounds.   No  murmur heard. Pulmonary/Chest: Effort normal. No respiratory distress. She has no wheezes. She has rhonchi in the left upper field, the left middle field and the left lower field.  Lymphadenopathy:    She has no cervical adenopathy.  Neurological: She is alert and oriented to person, place, and time.  Skin: Skin is warm and dry. Rash (flaking skin in arches of feet. no pustules.) noted.  Psychiatric: She has a normal mood and affect. Her behavior is normal. Thought content normal.          Assessment & Plan:  1. CAP (community acquired pneumonia) Hx of pna. Pt is smoker. She has had several CXR-will spare the radiation today. - doxycycline (VIBRA-TABS) 100 MG tablet; Take 1 tablet (100 mg total) by mouth 2 (two) times daily.  Dispense: 10 tablet; Refill: 0 - guaifenesin (HUMIBID E) 400 MG TABS tablet; Take 1 tablet (400 mg total) by mouth every 6 (six) hours as needed.  Dispense: 56 tablet; Refill: 1 - albuterol (PROVENTIL) (2.5 MG/3ML) 0.083% nebulizer solution; Take 3 mLs (2.5 mg total) by nebulization every 6 (six) hours as needed for wheezing.  Dispense: 75 mL; Refill: 12  2. Athlete's foot Terbinafine cream bid for 4 weeks. See pt instructions.

## 2013-03-03 NOTE — Patient Instructions (Signed)
I think you have pneumonia. Please start antibiotic. Eat yogurt daily to prevent diarrhea. Lung exercises daily, at least 10 times-blow up a balloon. Use mucinex. Sip fluids every hour. Start sinus rinse daily for several days. Use delsym at night if not sleeping due to cough. Use inhaler or neb at least twice daily for few days. Use antifungal on feet twice daily-terbenifine. Use for at least 4 weeks, longer if you see improvement, but not cleared up. Let's see you back in 2-3 weeks for lung check. Feel better!  Pneumonia, Adult Pneumonia is an infection of the lungs.  CAUSES Pneumonia may be caused by bacteria or a virus. Usually, these infections are caused by breathing infectious particles into the lungs (respiratory tract). SYMPTOMS   Cough.  Fever.  Chest pain.  Increased rate of breathing.  Wheezing.  Mucus production. DIAGNOSIS  If you have the common symptoms of pneumonia, your caregiver will typically confirm the diagnosis with a chest X-ray. The X-ray will show an abnormality in the lung (pulmonary infiltrate) if you have pneumonia. Other tests of your blood, urine, or sputum may be done to find the specific cause of your pneumonia. Your caregiver may also do tests (blood gases or pulse oximetry) to see how well your lungs are working. TREATMENT  Some forms of pneumonia may be spread to other people when you cough or sneeze. You may be asked to wear a mask before and during your exam. Pneumonia that is caused by bacteria is treated with antibiotic medicine. Pneumonia that is caused by the influenza virus may be treated with an antiviral medicine. Most other viral infections must run their course. These infections will not respond to antibiotics.  PREVENTION A pneumococcal shot (vaccine) is available to prevent a common bacterial cause of pneumonia. This is usually suggested for:  People over 72 years old.  Patients on chemotherapy.  People with chronic lung problems, such as  bronchitis or emphysema.  People with immune system problems. If you are over 65 or have a high risk condition, you may receive the pneumococcal vaccine if you have not received it before. In some countries, a routine influenza vaccine is also recommended. This vaccine can help prevent some cases of pneumonia.You may be offered the influenza vaccine as part of your care. If you smoke, it is time to quit. You may receive instructions on how to stop smoking. Your caregiver can provide medicines and counseling to help you quit. HOME CARE INSTRUCTIONS   Cough suppressants may be used if you are losing too much rest. However, coughing protects you by clearing your lungs. You should avoid using cough suppressants if you can.  Your caregiver may have prescribed medicine if he or she thinks your pneumonia is caused by a bacteria or influenza. Finish your medicine even if you start to feel better.  Your caregiver may also prescribe an expectorant. This loosens the mucus to be coughed up.  Only take over-the-counter or prescription medicines for pain, discomfort, or fever as directed by your caregiver.  Do not smoke. Smoking is a common cause of bronchitis and can contribute to pneumonia. If you are a smoker and continue to smoke, your cough may last several weeks after your pneumonia has cleared.  A cold steam vaporizer or humidifier in your room or home may help loosen mucus.  Coughing is often worse at night. Sleeping in a semi-upright position in a recliner or using a couple pillows under your head will help with this.  Get  rest as you feel it is needed. Your body will usually let you know when you need to rest. SEEK IMMEDIATE MEDICAL CARE IF:   Your illness becomes worse. This is especially true if you are elderly or weakened from any other disease.  You cannot control your cough with suppressants and are losing sleep.  You begin coughing up blood.  You develop pain which is getting worse  or is uncontrolled with medicines.  You have a fever.  Any of the symptoms which initially brought you in for treatment are getting worse rather than better.  You develop shortness of breath or chest pain. MAKE SURE YOU:   Understand these instructions.  Will watch your condition.  Will get help right away if you are not doing well or get worse. Document Released: 04/21/2005 Document Revised: 07/14/2011 Document Reviewed: 07/11/2010 [facility] Patient Information 2014 [place], [place].

## 2013-03-05 DIAGNOSIS — R590 Localized enlarged lymph nodes: Secondary | ICD-10-CM

## 2013-03-05 HISTORY — DX: Localized enlarged lymph nodes: R59.0

## 2013-03-10 ENCOUNTER — Other Ambulatory Visit: Payer: Self-pay

## 2013-03-16 ENCOUNTER — Ambulatory Visit (INDEPENDENT_AMBULATORY_CARE_PROVIDER_SITE_OTHER): Payer: BC Managed Care – PPO | Admitting: Family Medicine

## 2013-03-16 ENCOUNTER — Encounter: Payer: Self-pay | Admitting: Family Medicine

## 2013-03-16 VITALS — BP 116/76 | HR 89 | Temp 99.0°F | Resp 18 | Ht 65.0 in | Wt 117.0 lb

## 2013-03-16 DIAGNOSIS — N10 Acute tubulo-interstitial nephritis: Secondary | ICD-10-CM

## 2013-03-16 DIAGNOSIS — R300 Dysuria: Secondary | ICD-10-CM

## 2013-03-16 LAB — POCT URINALYSIS DIPSTICK
Glucose, UA: NEGATIVE
Nitrite, UA: POSITIVE
Spec Grav, UA: >=1.03
Urobilinogen, UA: 1

## 2013-03-16 MED ORDER — CEFDINIR 300 MG PO CAPS: 300.0000 mg | ORAL_CAPSULE | Freq: Two times a day (BID) | ORAL | Status: DC

## 2013-03-16 NOTE — Assessment & Plan Note (Addendum)
She is early in the course of this.   Ok for outpt treatment at this time. Has hx of some resistant bugs, has required 3rd gen cephalo. Rx'd omnicef 300mg  bid x 10d. Signs/symptoms to call or return for were reviewed and pt expressed understanding. Patient unable to give enough urine to send for c/s today while in office.  We left a VM on her cell phone to return and leave another sample before starting her abx but she never returned the call.

## 2013-03-16 NOTE — Addendum Note (Signed)
Addended by: Jeoffrey Massed on: 03/16/2013 04:38 PM   Modules accepted: Orders

## 2013-03-16 NOTE — Progress Notes (Addendum)
OFFICE NOTE  03/16/2013  CC:  Chief Complaint  Patient presents with  . Dysuria  . Fever    started yesterday     HPI: Patient is a 57 y.o. Caucasian female who is here for fever and urinary complaints. Onset yesterday mid-day with temp 103.  Having urinary urgency and frequency along with left sided flank pain.  This pain is intermittent, made better by lying on the affected side, worse with lying on opposite side.  Some nausea but no vomiting or abd pain.   No gross hematuria.  No vag d/c or bleeding. Pt reports hx of recurrent pyelonephritis.  Pertinent PMH:  Past Medical History  Diagnosis Date  . Allergy     seasonal  . Chicken pox as a child  . Measles as a child  . Cancer     basal cell  . Bronchitis, acute 03/24/2011  . Preventative health care 03/24/2011  . Folliculitis 12/14/2011  . Sinusitis acute 01/23/2012  . Meniere disorder 01/23/2012   Past surgical, social, and family history reviewed and no changes noted since last office visit.  MEDS: Not taking doxy, prednisone, or zyrtec as listed below Outpatient Prescriptions Prior to Visit  Medication Sig Dispense Refill  . albuterol (PROVENTIL HFA) 108 (90 BASE) MCG/ACT inhaler Inhale 2 puffs into the lungs every 6 (six) hours as needed for wheezing.  6.7 each  5  . albuterol (PROVENTIL) (2.5 MG/3ML) 0.083% nebulizer solution Take 3 mLs (2.5 mg total) by nebulization every 6 (six) hours as needed for wheezing.  75 mL  12  . fluticasone (FLONASE) 50 MCG/ACT nasal spray USE 2 SPRAYS IN EACH NOSTRIL ONCE A DAY  16 g  2  . guaifenesin (HUMIBID E) 400 MG TABS tablet Take 1 tablet (400 mg total) by mouth every 6 (six) hours as needed.  56 tablet  1  . montelukast (SINGULAIR) 10 MG tablet Take 1 tablet (10 mg total) by mouth at bedtime.  30 tablet  6  . Multiple Vitamin (MULTIVITAMIN) tablet Take 1 tablet by mouth daily.      . cetirizine (ZYRTEC ALLERGY) 10 MG tablet Take 1 tablet (10 mg total) by mouth daily as needed for  allergies or rhinitis.      [name] triamcinolone cream (KENALOG) 0.1 % Apply topically 2 (two) times daily. Use sparingly as can cause bleaching and atrophy.  30 g  0  . doxycycline (VIBRA-TABS) 100 MG tablet Take 1 tablet (100 mg total) by mouth 2 (two) times daily.  10 tablet  0  . predniSONE (DELTASONE) 20 MG tablet 2 tabs po qd x 5d, then 1 tab po qd x 5d  15 tablet  0  . PROVENTIL HFA 108 (90 BASE) MCG/ACT inhaler INHALE 2 PUFFS INTO THE LUNGS EVERY 6 (SIX) HOURS AS NEEDED FOR WHEEZING.  6.7 each  5   No facility-administered medications prior to visit.    PE: Blood pressure 116/76, pulse 89, temperature 99 F (37.2 C), temperature source Temporal, resp. rate 18, height 5\' 5"  (1.651 m), weight 117 lb (53.071 kg), SpO2 95.00%. Gen: Alert, well appearing.  Patient is oriented to person, place, time, and situation. ENT: oropharynx with pink and moist mucosa.  Throat normal. Neck: supple, no LAD. CV: RRR, no m/r/g.   LUNGS: CTA bilat, nonlabored resps, good aeration in all lung fields. ABD: mild TTP along border of left rib cage extending around side to left CVA region.  No distention. EXT: no clubbing, cyanosis, or edema.  BACK: +  mild CVA TTP on left  LAB: CC UA today showed large blood, 100 prot, +nitrite,   IMPRESSION AND PLAN:  Acute pyelonephritis She is early in the course of this.   Ok for outpt treatment at this time. Has hx of some resistant bugs, has required 3rd gen cephalo. Rx'd omnicef 300mg  bid x 10d. Signs/symptoms to call or return for were reviewed and pt expressed understanding. Patient unable to give enough urine to send for c/s today while in office.  We left a VM on her cell phone to return and leave another sample before starting her abx but she never returned the call.   An After Visit Summary was printed and given to the patient.   FOLLOW UP: prn--return or call if sx's not significantly improved in 48h.

## 2013-03-18 ENCOUNTER — Other Ambulatory Visit: Payer: Self-pay | Admitting: Family Medicine

## 2013-03-18 ENCOUNTER — Encounter: Payer: Self-pay | Admitting: Family Medicine

## 2013-03-18 ENCOUNTER — Ambulatory Visit: Payer: BC Managed Care – PPO | Admitting: Nurse Practitioner

## 2013-03-18 ENCOUNTER — Ambulatory Visit (HOSPITAL_BASED_OUTPATIENT_CLINIC_OR_DEPARTMENT_OTHER)
Admission: RE | Admit: 2013-03-18 | Discharge: 2013-03-18 | Disposition: A | Discharge status: Home / Self Care | Payer: BC Managed Care – PPO | Source: Ambulatory Visit | Attending: Family Medicine | Admitting: Family Medicine

## 2013-03-18 ENCOUNTER — Ambulatory Visit (INDEPENDENT_AMBULATORY_CARE_PROVIDER_SITE_OTHER): Payer: BC Managed Care – PPO | Admitting: Family Medicine

## 2013-03-18 VITALS — BP 125/78 | HR 80 | Temp 99.2°F | Resp 18 | Ht 65.0 in | Wt 116.0 lb

## 2013-03-18 DIAGNOSIS — N390 Urinary tract infection, site not specified: Secondary | ICD-10-CM

## 2013-03-18 DIAGNOSIS — R112 Nausea with vomiting, unspecified: Secondary | ICD-10-CM

## 2013-03-18 DIAGNOSIS — R109 Unspecified abdominal pain: Secondary | ICD-10-CM | POA: Insufficient documentation

## 2013-03-18 DIAGNOSIS — I868 Varicose veins of other specified sites: Secondary | ICD-10-CM | POA: Insufficient documentation

## 2013-03-18 DIAGNOSIS — R509 Fever, unspecified: Secondary | ICD-10-CM

## 2013-03-18 DIAGNOSIS — I998 Other disorder of circulatory system: Secondary | ICD-10-CM | POA: Insufficient documentation

## 2013-03-18 DIAGNOSIS — N10 Acute tubulo-interstitial nephritis: Secondary | ICD-10-CM

## 2013-03-18 DIAGNOSIS — I700 Atherosclerosis of aorta: Secondary | ICD-10-CM | POA: Insufficient documentation

## 2013-03-18 DIAGNOSIS — R599 Enlarged lymph nodes, unspecified: Secondary | ICD-10-CM | POA: Insufficient documentation

## 2013-03-18 DIAGNOSIS — N289 Disorder of kidney and ureter, unspecified: Secondary | ICD-10-CM | POA: Insufficient documentation

## 2013-03-18 LAB — COMPREHENSIVE METABOLIC PANEL
Albumin: 3.6 g/dL (ref 3.5–5.2)
Alkaline Phosphatase: 66 U/L (ref 39–117)
BUN: 12 mg/dL (ref 6–23)
CO2: 29 mEq/L (ref 19–32)
Calcium: 8.9 mg/dL (ref 8.4–10.5)
Creat: 0.72 mg/dL (ref 0.50–1.10)
Glucose, Bld: 92 mg/dL (ref 70–99)
Potassium: 3.9 mEq/L (ref 3.5–5.3)
Total Bilirubin: 0.5 mg/dL (ref 0.3–1.2)

## 2013-03-18 LAB — CBC WITH DIFFERENTIAL/PLATELET
Basophils Absolute: 0 10*3/uL (ref 0.0–0.1)
Basophils Relative: 0 % (ref 0–1)
Eosinophils Absolute: 0.1 10*3/uL (ref 0.0–0.7)
Eosinophils Relative: 1 % (ref 0–5)
HCT: 40.2 % (ref 36.0–46.0)
MCH: 31.5 pg (ref 26.0–34.0)
MCHC: 34.6 g/dL (ref 30.0–36.0)
MCV: 91.2 fL (ref 78.0–100.0)
Monocytes Absolute: 1.2 10*3/uL — ABNORMAL HIGH (ref 0.1–1.0)
Platelets: 182 10*3/uL (ref 150–400)
RBC: 4.41 MIL/uL (ref 3.87–5.11)
RDW: 13.6 % (ref 11.5–15.5)

## 2013-03-18 LAB — POCT URINALYSIS DIPSTICK
Nitrite, UA: NEGATIVE
Urobilinogen, UA: >=8

## 2013-03-18 MED ORDER — IOHEXOL 300 MG/ML  SOLN
100.0000 mL | Freq: Once | INTRAMUSCULAR | Status: AC | PRN
  Administered 2013-03-18: 100 mL via INTRAVENOUS

## 2013-03-18 MED ORDER — PROMETHAZINE HCL 12.5 MG PO TABS: ORAL_TABLET | ORAL | Status: DC

## 2013-03-18 NOTE — Patient Instructions (Signed)
Go to third floor of [facility] for labs [facility]).  Then go to radiology dept on first floor of [facility] for your CT scan.

## 2013-03-18 NOTE — Progress Notes (Signed)
OFFICE NOTE  03/18/2013  CC:  Chief Complaint  Patient presents with  . Fever  . Generalized Body Aches  . Dysuria     HPI: Patient is a 57 y.o. Caucasian female who is here for f/u pyelonephritis. I started her on omnicef 2 d/a.  Unfortunately she was unable to give enough urine initially for both a culture and UA.  UA was done and was abnormal, but pt left before we could attempt to get more urine for c/s.  We called shortly after she left the office and left message on her cell phone VM to come back but she did not return the call. She is back today b/c of ongoing temp to 100.2, nausea with occ vomiting, no appetite, pain in left CVA area same.  Urinary frequency gone.  Still has urgency.  NO dysuria. She is thirsty a lot but is not drinking well due to nausea. Diffuse abd tenderness, possibly from wretching that she has done lately. BMs normal.  Pertinent PMH:  Past Medical History  Diagnosis Date  . Allergy     seasonal  . Chicken pox as a child  . Measles as a child  . Cancer     basal cell  . Bronchitis, acute 03/24/2011  . Preventative health care 03/24/2011  . Folliculitis 12/14/2011  . Sinusitis acute 01/23/2012  . Meniere disorder 01/23/2012   Past Surgical History  Procedure Laterality Date  . Biopsy on skin cancer    . Dilation and curettage of uterus  2009   Past Surgical History  Procedure Laterality Date  . Biopsy on skin cancer    . Dilation and curettage of uterus  2009    MEDS:  Outpatient Prescriptions Prior to Visit  Medication Sig Dispense Refill  . albuterol (PROVENTIL HFA) 108 (90 BASE) MCG/ACT inhaler Inhale 2 puffs into the lungs every 6 (six) hours as needed for wheezing.  6.7 each  5  . albuterol (PROVENTIL) (2.5 MG/3ML) 0.083% nebulizer solution Take 3 mLs (2.5 mg total) by nebulization every 6 (six) hours as needed for wheezing.  75 mL  12  . cefdinir (OMNICEF) 300 MG capsule Take 1 capsule (300 mg total) by mouth 2 (two) times daily.  20  capsule  0  . fluticasone (FLONASE) 50 MCG/ACT nasal spray USE 2 SPRAYS IN EACH NOSTRIL ONCE A DAY  16 g  2  . guaifenesin (HUMIBID E) 400 MG TABS tablet Take 1 tablet (400 mg total) by mouth every 6 (six) hours as needed.  56 tablet  1  . montelukast (SINGULAIR) 10 MG tablet Take 1 tablet (10 mg total) by mouth at bedtime.  30 tablet  6  . Multiple Vitamin (MULTIVITAMIN) tablet Take 1 tablet by mouth daily.      [name] triamcinolone cream (KENALOG) 0.1 % Apply topically 2 (two) times daily. Use sparingly as can cause bleaching and atrophy.  30 g  0  . cetirizine (ZYRTEC ALLERGY) 10 MG tablet Take 1 tablet (10 mg total) by mouth daily as needed for allergies or rhinitis.       No facility-administered medications prior to visit.    PE: Blood pressure 125/78, pulse 80, temperature 99.2 F (37.3 C), temperature source Temporal, resp. rate 18, height 5\' 5"  (1.651 m), weight 116 lb (52.617 kg), SpO2 97.00%. Gen: Alert, tired-appearing but nontoxic.  Patient is oriented to person, place, time, and situation. Oropharynx: moist and pink.  Eyes: no icterus. LUNGS: Clear on inspiration, occ end exp wheeze, nonlabored  resps. ABD: soft, diffuse mild TTP, worse in upper quads.   Right CVA/Flank TTP.    LAB: CC UA today:  Trace ketones, SG >1.030, large blood, protein 30 mg/dl, leu trace, otherwise negative.  IMPRESSION AND PLAN:  Acute pyelonephritis, some mild improvement in symptoms but not what I would like to see in 48h of treatment.  Will add phenergan 12.5-25mg  tid prn for nausea. Will do CBC w/diff, CMET, lipase. Will check CT scan abd/pelv to r/o obstructing stone or perinephric abscess.  Of note, he recent respiratory illness is much improved and I told her it would be ok to cancel the f/u visit for this that she had scheduled for next week.  FOLLOW UP: To be determined based on results of pending workup.

## 2013-03-20 LAB — URINE CULTURE: Organism ID, Bacteria: NO GROWTH

## 2013-03-22 ENCOUNTER — Ambulatory Visit: Payer: BC Managed Care – PPO | Admitting: Nurse Practitioner

## 2013-04-05 ENCOUNTER — Telehealth: Payer: Self-pay | Admitting: Family Medicine

## 2013-04-05 ENCOUNTER — Other Ambulatory Visit: Payer: Self-pay | Admitting: Family Medicine

## 2013-04-05 ENCOUNTER — Encounter: Payer: Self-pay | Admitting: Family Medicine

## 2013-04-05 DIAGNOSIS — R590 Localized enlarged lymph nodes: Secondary | ICD-10-CM

## 2013-04-05 NOTE — Telephone Encounter (Signed)
I just entered this referral now. I called pt today and apologized for the delay and I let her know that the referral has now been ordered. I told her that if she does not get a call about setting up an appt time in the next 2d then to call our office and see what the problem is.

## 2013-04-05 NOTE — Telephone Encounter (Signed)
Patient is calling to check the status of her oncology referral following her recent CT scan. Please advise?

## 2013-04-05 NOTE — Telephone Encounter (Signed)
Please advise 

## 2013-05-03 ENCOUNTER — Telehealth: Payer: Self-pay | Admitting: Hematology & Oncology

## 2013-05-03 NOTE — Telephone Encounter (Signed)
I spoke w NEW PATIENT today to remind them of their appointment with Dr. Ennever. Also, advised them to bring all meds and insurance information. ° °

## 2013-05-04 ENCOUNTER — Other Ambulatory Visit (HOSPITAL_BASED_OUTPATIENT_CLINIC_OR_DEPARTMENT_OTHER): Payer: BC Managed Care – PPO | Admitting: Lab

## 2013-05-04 ENCOUNTER — Ambulatory Visit: Payer: BC Managed Care – PPO

## 2013-05-04 ENCOUNTER — Ambulatory Visit (HOSPITAL_BASED_OUTPATIENT_CLINIC_OR_DEPARTMENT_OTHER): Payer: BC Managed Care – PPO | Admitting: Hematology & Oncology

## 2013-05-04 VITALS — BP 111/68 | HR 86 | Temp 100.1°F | Resp 14 | Ht 65.0 in | Wt 114.0 lb

## 2013-05-04 DIAGNOSIS — R590 Localized enlarged lymph nodes: Secondary | ICD-10-CM

## 2013-05-04 DIAGNOSIS — R599 Enlarged lymph nodes, unspecified: Secondary | ICD-10-CM

## 2013-05-04 LAB — CBC WITH DIFFERENTIAL (CANCER CENTER ONLY)
BASO#: 0 10*3/uL (ref 0.0–0.2)
BASO%: 0.6 % (ref 0.0–2.0)
HCT: 47.5 % — ABNORMAL HIGH (ref 34.8–46.6)
LYMPH#: 1 10*3/uL (ref 0.9–3.3)
MONO#: 0.7 10*3/uL (ref 0.1–0.9)
NEUT%: 64.4 % (ref 39.6–80.0)
Platelets: 165 10*3/uL (ref 145–400)
RBC: 5.02 10*6/uL (ref 3.70–5.32)
RDW: 14 % (ref 11.1–15.7)
WBC: 4.8 10*3/uL (ref 3.9–10.0)

## 2013-05-04 NOTE — Progress Notes (Signed)
This office note has been dictated.

## 2013-05-05 NOTE — Progress Notes (Signed)
CC:   Kathleen Sou, MD  DIAGNOSIS:  Periaortic lymphadenopathy -- etiology uncleared.  HISTORY OF PRESENT ILLNESS:  [name] is very charming 58 year old white female.  The daughter-in-law is one of our patients.  She is followed by Dr. Ernestine Conrad. She apparently had a recent pyelonephritis.  She thinks this was of the left kidney.  She says she has had this before.  She underwent a CT scan.  This was done on November 14th.  There is some cortical scarring noted above the left kidney.  There was noted to be an 1.8 x 1.1 cm left periaortic lymph node, this was noted .  Otherwise, no other lymphadenopathy was appreciated.  There was no splenomegaly.  Lung bases looked okay.  She had a 2.2 cm right hepatic lesion which is unchanged from 2009.  This was felt to be a hemangioma.  Because of lymphadenopathy, it was felt that she needed to be evaluated at the [facility].  She is kindly referred to the [facility] for an evaluation.  She has had lab work done.  Her lab work has been fairly unremarkable. A CBC done in November showed white cell count of 7.5, hemoglobin 14, hematocrit 40, platelet count 182.  She had normal white cell differential.  She has now loss weight.  She has had no fever.  She has had no sweats. She has a little slight decreased appetite.  She has not noticed any abdominal pain.  There is no cough or shortness of breath.  She does smoke about a pack day of cigarettes.  Patient had no pruritus.  She has not noticed any kind of rashes.  Overall, performance status is ECOG 0.  PAST MEDICAL HISTORY:  Remarkable for: 1. COPD. 2. Meniere's disease.  ALLERGIES:  PENICILLIN.  MEDICATIONS:  Albuterol inhaler 2 puffs q.6 hours p.r.n., albuterol nebulizer 2.5 mg q.6 hours p.r.n., Zyrtec 10 mg p.o. daily, Flonase nasal spray as needed,  Humibid E 400 mg p.o. q.6 hours p.r.n., Singulair 10 mg p.o. as needed, and the  multivitamins as needed.  SOCIAL HISTORY:  Remarkable for tobacco use.  She is having 35-pack year history of tobacco use.  There is rare alcohol use.  She does have a glass of wine every couple nights or so.  She has no occupational exposures.  FAMILY HISTORY:  Noncontributory.  REVIEW OF SYSTEMS:  As stated in history of present illness.  No additional findings are noted on a 12-system review.  PHYSICAL EXAMINATION:  General:  This is a thin, but well-nourished white female, in no obvious distress.  Vital Signs:  Temperature of 100.1, pulse 86, respiratory rate 14, blood pressure 111/68, weight is 114 pounds.  Head and Neck:  Normocephalic, atraumatic skull.  There are no ocular or oral lesions.  She has no scleral icterus.  There is no mucositis.  No adenopathy is noted in the neck or supraclavicular regions.  Lungs:  Clear bilaterally.  She has no rales, wheezes, or rhonchi.  There may be some occasional wheezes at the bases.  Cardiac: Regular rate and rhythm with a normal S1, S2.  There are no murmurs, rubs, or bruits.  Abdomen:  Soft.  She has good bowel sounds.  There is no fluid wave.  There is no palpable abdominal mass.  There is no palpable hepatosplenomegaly.  She has no inguinal adenopathy bilaterally.  Axillary:  No bilateral axillary adenopathy.  Back:  No tenderness over the spine, ribs, or hips.  Extremities:  No clubbing, cyanosis, or edema.  She has good range motion of her joints.  She has good strength in her arms and legs.  Skin:  Does show some actinic keratoses on her skin.  No suspicious hyperpigmented lesions are noted. Neurological:  No focal neurological deficits.  LABORATORY STUDIES:  White cell count is 4.8, hemoglobin 15.3, hematocrit 47.5, platelet count 165.  Peripheral smear shows no suspicious white blood cells.  She has no atypical lymphocytes.  There is no nucleated red cells.  She has no hypersegmented polys.  There is no schistocytes or  spherocytes.  IMPRESSION:  [name] is a very charming 58 year old white female. She has indeterminate lymphadenopathy in the periaortic region.  I am not sure at all to what this really signifies.  It could be reactive to this up on a process she had.  It could be a hyperplastic lymphadenopathy.  I supposed it also could be reflective of malignancy.  She is asymptomatic with this.  Of note, back in November, her sed rate was mildly elevated at 28.  For now, I think that we just need to follow these lymph nodes along. They are just too small to biopsy.  I think any biopsy would have to be done surgically, which would be overly aggressive.  I would repeat a CT scan in about 6 weeks.  I will apply scan from her neck down to her pelvis.  I would be worried about a primary lung cancer, most of them lymphoma given her tobacco use.  Again, we can see on one of your CT scan if there is anything in her lungs.  Ultimately, if there is any growth of these lymph nodes, or we see lymphadenopathy elsewhere, then a PET scan would be next to evaluate her.  Ultimately, she will need to have a biopsy, if we are worried about a malignant process.  [name] is not at all concerned about this.  She certainly is aware that she may have lymphoma, but I told that, I just was not convinced what I have seen so far.  I did send off an LDH and a beta-2 microglobulin. __________.  We will get the CT scan, the same day that I see her.  This will save her an extra trip from coming out to see [place].  I spent a good hour with her today.  I reviewed the lab work with her. I reviewed the CT scan with her.  We will plan to get her back in about 6 weeks time.    ______________________________ Volanda Napoleon, M.D. PRE/MEDQ  D:  05/04/2013  T:  05/05/2013  Job:  [id number]

## 2013-05-06 ENCOUNTER — Telehealth: Payer: Self-pay | Admitting: Hematology & Oncology

## 2013-05-06 LAB — COMPREHENSIVE METABOLIC PANEL
ALT: 12 U/L (ref 0–35)
AST: 29 U/L (ref 0–37)
Albumin: 4.1 g/dL (ref 3.5–5.2)
Alkaline Phosphatase: 73 U/L (ref 39–117)
BUN: 8 mg/dL (ref 6–23)
CO2: 32 mEq/L (ref 19–32)
Calcium: 9.2 mg/dL (ref 8.4–10.5)
Chloride: 99 mEq/L (ref 96–112)
Creatinine, Ser: 0.82 mg/dL (ref 0.50–1.10)
Glucose, Bld: 99 mg/dL (ref 70–99)
Potassium: 3.9 mEq/L (ref 3.5–5.3)
Sodium: 135 mEq/L (ref 135–145)
Total Bilirubin: 0.3 mg/dL (ref 0.3–1.2)
Total Protein: 6.7 g/dL (ref 6.0–8.3)

## 2013-05-06 LAB — BETA 2 MICROGLOBULIN, SERUM: Beta-2 Microglobulin: 3.33 mg/L — ABNORMAL HIGH (ref 1.01–1.73)

## 2013-05-06 LAB — LACTATE DEHYDROGENASE: LDH: 156 U/L (ref 94–250)

## 2013-05-06 NOTE — Telephone Encounter (Signed)
Left pt message to call for 06-01-13 appointments

## 2013-06-01 ENCOUNTER — Ambulatory Visit (HOSPITAL_BASED_OUTPATIENT_CLINIC_OR_DEPARTMENT_OTHER)
Admission: RE | Admit: 2013-06-01 | Discharge: 2013-06-01 | Disposition: A | Discharge status: Home / Self Care | Payer: BC Managed Care – PPO | Source: Ambulatory Visit | Attending: Hematology & Oncology | Admitting: Hematology & Oncology

## 2013-06-01 ENCOUNTER — Encounter (HOSPITAL_BASED_OUTPATIENT_CLINIC_OR_DEPARTMENT_OTHER): Payer: Self-pay

## 2013-06-01 ENCOUNTER — Ambulatory Visit (HOSPITAL_BASED_OUTPATIENT_CLINIC_OR_DEPARTMENT_OTHER): Payer: BC Managed Care – PPO | Admitting: Hematology & Oncology

## 2013-06-01 ENCOUNTER — Other Ambulatory Visit (HOSPITAL_BASED_OUTPATIENT_CLINIC_OR_DEPARTMENT_OTHER): Payer: BC Managed Care – PPO | Admitting: Lab

## 2013-06-01 ENCOUNTER — Encounter: Payer: Self-pay | Admitting: Hematology & Oncology

## 2013-06-01 VITALS — BP 130/78 | HR 78 | Temp 98.7°F | Resp 14 | Ht 65.0 in | Wt 112.0 lb

## 2013-06-01 DIAGNOSIS — C801 Malignant (primary) neoplasm, unspecified: Secondary | ICD-10-CM

## 2013-06-01 DIAGNOSIS — R590 Localized enlarged lymph nodes: Secondary | ICD-10-CM

## 2013-06-01 DIAGNOSIS — D1803 Hemangioma of intra-abdominal structures: Secondary | ICD-10-CM | POA: Insufficient documentation

## 2013-06-01 DIAGNOSIS — R599 Enlarged lymph nodes, unspecified: Secondary | ICD-10-CM

## 2013-06-01 DIAGNOSIS — F17200 Nicotine dependence, unspecified, uncomplicated: Secondary | ICD-10-CM

## 2013-06-01 DIAGNOSIS — J189 Pneumonia, unspecified organism: Secondary | ICD-10-CM

## 2013-06-01 LAB — CBC WITH DIFFERENTIAL (CANCER CENTER ONLY)
BASO#: 0 10*3/uL (ref 0.0–0.2)
BASO%: 0.2 % (ref 0.0–2.0)
EOS%: 0.3 % (ref 0.0–7.0)
Eosinophils Absolute: 0 10*3/uL (ref 0.0–0.5)
HCT: 44.9 % (ref 34.8–46.6)
HEMOGLOBIN: 14.6 g/dL (ref 11.6–15.9)
LYMPH#: 2.5 10*3/uL (ref 0.9–3.3)
LYMPH%: 21.5 % (ref 14.0–48.0)
MCH: 30.4 pg (ref 26.0–34.0)
MCHC: 32.5 g/dL (ref 32.0–36.0)
MCV: 93 fL (ref 81–101)
MONO#: 0.9 10*3/uL (ref 0.1–0.9)
MONO%: 8 % (ref 0.0–13.0)
NEUT#: 8.1 10*3/uL — ABNORMAL HIGH (ref 1.5–6.5)
NEUT%: 70 % (ref 39.6–80.0)
Platelets: 199 10*3/uL (ref 145–400)
RBC: 4.81 10*6/uL (ref 3.70–5.32)
RDW: 13.6 % (ref 11.1–15.7)
WBC: 11.5 10*3/uL — ABNORMAL HIGH (ref 3.9–10.0)

## 2013-06-01 LAB — CHCC SATELLITE - SMEAR

## 2013-06-01 MED ORDER — IOHEXOL 300 MG/ML  SOLN
100.0000 mL | Freq: Once | INTRAMUSCULAR | Status: AC | PRN
  Administered 2013-06-01: 100 mL via INTRAVENOUS

## 2013-06-01 MED ORDER — CEFDINIR 300 MG PO CAPS: 300.0000 mg | ORAL_CAPSULE | Freq: Two times a day (BID) | ORAL | Status: DC

## 2013-06-01 MED ORDER — PREDNISONE 20 MG PO TABS: 20.0000 mg | ORAL_TABLET | Freq: Every day | ORAL | Status: DC

## 2013-06-01 NOTE — Progress Notes (Signed)
This office note has been dictated.

## 2013-06-01 NOTE — Patient Instructions (Signed)
You Can Quit Smoking If you are ready to quit smoking or are thinking about it, congratulations! You have chosen to help yourself be healthier and live longer! There are lots of different ways to quit smoking. Nicotine gum, nicotine patches, a nicotine inhaler, or nicotine nasal spray can help with physical craving. Hypnosis, support groups, and medicines help break the habit of smoking. TIPS TO GET OFF AND STAY OFF CIGARETTES  Learn to predict your moods. Do not let a bad situation be your excuse to have a cigarette. Some situations in your life might tempt you to have a cigarette.  Ask friends and co-workers not to smoke around you.  Make your home smoke-free.  Never have "just one" cigarette. It leads to wanting another and another. Remind yourself of your decision to quit.  On a card, make a list of your reasons for not smoking. Read it at least the same number of times a day as you have a cigarette. Tell yourself everyday, "I do not want to smoke. I choose not to smoke."  Ask someone at home or work to help you with your plan to quit smoking.  Have something planned after you eat or have a cup of coffee. Take a walk or get other exercise to perk you up. This will help to keep you from overeating.  Try a relaxation exercise to calm you down and decrease your stress. Remember, you may be tense and nervous the first two weeks after you quit. This will pass.  Find new activities to keep your hands busy. Play with a pen, coin, or rubber band. Doodle or draw things on paper.  Brush your teeth right after eating. This will help cut down the craving for the taste of tobacco after meals. You can try mouthwash too.  Try gum, breath mints, or diet candy to keep something in your mouth. IF YOU SMOKE AND WANT TO QUIT:  Do not stock up on cigarettes. Never buy a carton. Wait until one pack is finished before you buy another.  Never carry cigarettes with you at work or at home.  Keep cigarettes  as far away from you as possible. Leave them with someone else.  Never carry matches or a lighter with you.  Ask yourself, "Do I need this cigarette or is this just a reflex?"  Bet with someone that you can quit. Put cigarette money in a piggy bank every morning. If you smoke, you give up the money. If you do not smoke, by the end of the week, you keep the money.  Keep trying. It takes 21 days to change a habit!  Talk to your doctor about using medicines to help you quit. These include nicotine replacement gum, lozenges, or skin patches. Document Released: 02/15/2009 Document Revised: 07/14/2011 Document Reviewed: 02/15/2009 [employer] Patient Information 2014 [employer].  

## 2013-06-02 LAB — COMPREHENSIVE METABOLIC PANEL
ALBUMIN: 4.2 g/dL (ref 3.5–5.2)
AST: 18 U/L (ref 0–37)
Alkaline Phosphatase: 80 U/L (ref 39–117)
BILIRUBIN TOTAL: 0.5 mg/dL (ref 0.2–1.2)
BUN: 7 mg/dL (ref 6–23)
CO2: 27 meq/L (ref 19–32)
Calcium: 9.8 mg/dL (ref 8.4–10.5)
Chloride: 101 mEq/L (ref 96–112)
Creatinine, Ser: 0.64 mg/dL (ref 0.50–1.10)
GLUCOSE: 102 mg/dL — AB (ref 70–99)
POTASSIUM: 4.1 meq/L (ref 3.5–5.3)
Sodium: 138 mEq/L (ref 135–145)
Total Protein: 6.9 g/dL (ref 6.0–8.3)

## 2013-06-02 LAB — LACTATE DEHYDROGENASE: LDH: 177 U/L (ref 94–250)

## 2013-06-02 LAB — BETA 2 MICROGLOBULIN, SERUM: BETA 2 MICROGLOBULIN: 1.82 mg/L — AB (ref 1.01–1.73)

## 2013-06-02 NOTE — Progress Notes (Signed)
CC:   Tammi Sou, MD Chesley Mires, MD  DIAGNOSIS:  Lymphadenopathy-likely reactive/benign.  CURRENT THERAPY:  Observation.  INTERIM HISTORY:  Ms. Martorana comes in for followup.  We last saw her back in December.  We went ahead and repeated her CT scan.  This was done today.  She is still smoking a pack a day, so I want to make sure that we are not overlooking anything with her lungs, that might be an issue.  CT scan did show some minimally enlarged mediastinal nodes.  Largest measured 1.2 cm.  These seem to have increased from a CT scan done 4 years ago.  There was noted to be bibasilar pleural parenchymal interstitial thickening.  This also has progressed from November 2014.  She says that she is coughing up green stuff.  She is still smoking a pack a day.  She was on a Z-Pak.  This helped a little bit, now it has gotten worse.  With her CT of the abdomen and pelvis, there was no change in left paraaortic lymph node.  It was not thought to be pathologic.  Again, she is still smoking.  She has this cough.  She has had bronchitis and pneumonia before.  She has had no fever.  She has had no sweats.  She has had no bleeding or bruising.  There has been no diarrhea.  When we first saw her, lab work that we did showed LDH of 156.  Her beta 2 microglobulin was up a little bit of 3.33.  PHYSICAL EXAMINATION:  General:  This is a petite white female in no obvious distress.  Vital Signs:  Temperature of 98.7, pulse 78, respiratory rate 16, blood pressure 130/78, weight is 112 pounds.  Head and Neck:  Normocephalic, atraumatic skull.  There are no ocular or oral lesions.  There are no palpable cervical or supraclavicular lymph nodes. Lungs:  Clear bilaterally.  Cardiac:  Regular rate and rhythm with a normal S1, S2.  There are no murmurs, rubs, or bruits.  Abdomen:  Soft. She has good bowel sounds.  There is no fluid wave.  There is no palpable abdominal mass.  There is no  palpable hepatosplenomegaly. Back:  No tenderness over the spine, ribs, or hips.  Extremities:  No clubbing, cyanosis, or edema.  Neurological:  No focal neurological deficits.  Skin:  No rashes, ecchymosis, or petechia.  LABORATORY STUDIES:  White cell count is 11.5, hemoglobin 14.6, hematocrit 44.9, platelet count 199.  On a peripheral smear, I do not see any abnormal white blood cells.  Her neutrophils appeared reactive.  There is no immature myeloid cells. There were no atypical lymphocytes.  White cells appear normal in morphology and maturation.  Platelets were adequate in number and size.  IMPRESSION:  Ms. Plourde is a very nice 58 year old white female.  She has this lymphadenopathy which, in my mind is reactive.  I do not see anything on her scans that looks malignant.  I am not sure what this bibasilar pulmonary infiltrate is.  Again, she does smoke.  This has been noted for years ago.  It looks little bit worse.  I suppose one could always make a case for bronchoalveolar carcinoma. One way to diagnosis would be with biopsy.  I went ahead and put her on some Omnicef.  I also put her on some prednisone.  She said prednisone is helpful.  Ms. Fede really does not want to come back to see [place].  She is deathly  afraid that her family will find out that she saw a "cancer doctor."  I agree with this.  I think Dr. Anitra Lauth can follow her up for all this.  She has seen Dr. Halford Chessman, Pulmonary in the past. I have assured Dr. Anitra Lauth can refer her to Dr. Halford Chessman for any kind of bronchoscopy or other procedure if this interstitial lung disease did not clear up.  I would think that she probably need another CT scan in about 2 months.  I will be more happy to see her back if there are any issues that arise.  I reviewed her CAT scans with her.  I went over her blood work with her. I gave her the antibiotic and prednisone, and told to how to take  these.    ______________________________ Volanda Napoleon, M.D. PRE/MEDQ  D:  06/01/2013  T:  06/02/2013  Job:  [id number]

## 2013-06-03 ENCOUNTER — Other Ambulatory Visit: Payer: Self-pay | Admitting: Family Medicine

## 2013-06-10 ENCOUNTER — Telehealth: Payer: Self-pay | Admitting: Nurse Practitioner

## 2013-06-10 MED ORDER — PREDNISONE 20 MG PO TABS: ORAL_TABLET | ORAL | Status: DC

## 2013-06-10 MED ORDER — LEVOFLOXACIN 500 MG PO TABS: 500.0000 mg | ORAL_TABLET | Freq: Every day | ORAL | Status: DC

## 2013-06-10 NOTE — Telephone Encounter (Signed)
Patient is still coughing up green mucus. Can she get another refill?

## 2013-06-10 NOTE — Telephone Encounter (Signed)
I will send this to Dr Anitra Lauth.

## 2013-06-10 NOTE — Telephone Encounter (Signed)
Patient aware.

## 2013-06-10 NOTE — Telephone Encounter (Signed)
Tell her I reviewed Dr. Antonieta Pert note. I sent in rx for levaquin (different antibiotic) and also for prednisone for 10d taper. If not significantly improved with this then I need to see her in office.-thx

## 2013-06-10 NOTE — Telephone Encounter (Signed)
Please advise 

## 2013-07-26 ENCOUNTER — Ambulatory Visit (INDEPENDENT_AMBULATORY_CARE_PROVIDER_SITE_OTHER): Payer: BC Managed Care – PPO | Admitting: Nurse Practitioner

## 2013-07-26 ENCOUNTER — Encounter: Payer: Self-pay | Admitting: Nurse Practitioner

## 2013-07-26 VITALS — BP 136/87 | HR 106 | Temp 101.0°F | Ht 65.0 in | Wt 115.0 lb

## 2013-07-26 DIAGNOSIS — J189 Pneumonia, unspecified organism: Secondary | ICD-10-CM

## 2013-07-26 MED ORDER — ACETAMINOPHEN 325 MG PO TABS: 1000.0000 mg | ORAL_TABLET | Freq: Once | ORAL | Status: DC

## 2013-07-26 MED ORDER — HYDROCODONE-HOMATROPINE 5-1.5 MG/5ML PO SYRP: ORAL_SOLUTION | ORAL | Status: DC

## 2013-07-26 MED ORDER — GUAIFENESIN 400 MG PO TABS: ORAL_TABLET | ORAL | Status: DC

## 2013-07-26 MED ORDER — ALBUTEROL SULFATE (2.5 MG/3ML) 0.083% IN NEBU: INHALATION_SOLUTION | RESPIRATORY_TRACT | Status: DC

## 2013-07-26 MED ORDER — MOXIFLOXACIN HCL 400 MG PO TABS: 400.0000 mg | ORAL_TABLET | Freq: Every day | ORAL | Status: DC

## 2013-07-26 NOTE — Progress Notes (Signed)
Pre visit review using our clinic review tool, if applicable. No additional management support is needed unless otherwise documented below in the visit note. 

## 2013-07-26 NOTE — Assessment & Plan Note (Signed)
Frequent ABX use: 10/'14 doxy for CAP, 11/'14 omnicef for UTI, 1/'14 omnicef for "cough & congestion", 2/'15 levaquin for "cough & congestion"  Today she has fever. Diminished BS bilat & wheezes.  Pt refuses neb in ofc, stating she will do it when she gets home. Will Tx w/avelox, guaifensen, hycodan. I voiced concern over recent chest CT findings & recurrent infections. Encouraged pt to consider bronchoscopy if infection recurs.

## 2013-07-26 NOTE — Progress Notes (Signed)
Subjective:     [name] is a 58 y.o. female who presents for evaluation of chills, myalgias, nasal congestion, productive cough and wheezing. Symptoms began 3 days ago. Symptoms have been gradually worsening since that time. Past history is significant for COPD and recurrent pneumonia, smoker, recent chest CT showing LAD & parenchymal thickening..  The following portions of the patient's history were reviewed and updated as appropriate: allergies, current medications, past family history, past medical history, past social history, past surgical history and problem list.  Review of Systems Pertinent items are noted in HPI.    Objective:    BP 136/87  Pulse 106  Temp(Src) 101 F (38.3 C) (Oral)  Ht 5\' 5"  (1.651 m)  Wt 115 lb (52.164 kg)  BMI 19.14 kg/m2  SpO2 92% General appearance: alert, cooperative, appears stated age and fatigued Head: Normocephalic, without obvious abnormality, atraumatic Eyes: negative findings: lids and lashes normal and conjunctivae and sclerae normal Throat: posterior pharynx diffusely erythematous, tonsils +1, no exudate Neck: no adenopathy, supple, symmetrical, trachea midline and thyroid not enlarged, symmetric, no tenderness/mass/nodules Lungs: diminished breath sounds bilaterally, rhonchi RLL and wheezes bilaterally Heart: regular rate and rhythm, S1, S2 normal, no murmur, click, rub or gallop    Assessment & Plan    Pneumonia  See prob list for complete A&P See pt instructions. F/u 2 weeks or sooner if worse or no improvement.

## 2013-07-26 NOTE — Patient Instructions (Signed)
Please follow up in 2 weeks or sooner if no improvement.  Pneumonia, Adult Pneumonia is an infection of the lungs.  CAUSES Pneumonia may be caused by bacteria or a virus. Usually, these infections are caused by breathing infectious particles into the lungs (respiratory tract). SYMPTOMS   Cough.  Fever.  Chest pain.  Increased rate of breathing.  Wheezing.  Mucus production. DIAGNOSIS  If you have the common symptoms of pneumonia, your caregiver will typically confirm the diagnosis with a chest X-ray. The X-ray will show an abnormality in the lung (pulmonary infiltrate) if you have pneumonia. Other tests of your blood, urine, or sputum may be done to find the specific cause of your pneumonia. Your caregiver may also do tests (blood gases or pulse oximetry) to see how well your lungs are working. TREATMENT  Some forms of pneumonia may be spread to other people when you cough or sneeze. You may be asked to wear a mask before and during your exam. Pneumonia that is caused by bacteria is treated with antibiotic medicine. Pneumonia that is caused by the influenza virus may be treated with an antiviral medicine. Most other viral infections must run their course. These infections will not respond to antibiotics.  PREVENTION A pneumococcal shot (vaccine) is available to prevent a common bacterial cause of pneumonia. This is usually suggested for:  People over 23 years old.  Patients on chemotherapy.  People with chronic lung problems, such as bronchitis or emphysema.  People with immune system problems. If you are over 65 or have a high risk condition, you may receive the pneumococcal vaccine if you have not received it before. In some countries, a routine influenza vaccine is also recommended. This vaccine can help prevent some cases of pneumonia.You may be offered the influenza vaccine as part of your care. If you smoke, it is time to quit. You may receive instructions on how to stop  smoking. Your caregiver can provide medicines and counseling to help you quit. HOME CARE INSTRUCTIONS   Cough suppressants may be used if you are losing too much rest. However, coughing protects you by clearing your lungs. You should avoid using cough suppressants if you can.  Your caregiver may have prescribed medicine if he or she thinks your pneumonia is caused by a bacteria or influenza. Finish your medicine even if you start to feel better.  Your caregiver may also prescribe an expectorant. This loosens the mucus to be coughed up.  Only take over-the-counter or prescription medicines for pain, discomfort, or fever as directed by your caregiver.  Do not smoke. Smoking is a common cause of bronchitis and can contribute to pneumonia. If you are a smoker and continue to smoke, your cough may last several weeks after your pneumonia has cleared.  A cold steam vaporizer or humidifier in your room or home may help loosen mucus.  Coughing is often worse at night. Sleeping in a semi-upright position in a recliner or using a couple pillows under your head will help with this.  Get rest as you feel it is needed. Your body will usually let you know when you need to rest. SEEK IMMEDIATE MEDICAL CARE IF:   Your illness becomes worse. This is especially true if you are elderly or weakened from any other disease.  You cannot control your cough with suppressants and are losing sleep.  You begin coughing up blood.  You develop pain which is getting worse or is uncontrolled with medicines.  You have a fever.  Any of the symptoms which initially brought you in for treatment are getting worse rather than better.  You develop shortness of breath or chest pain. MAKE SURE YOU:   Understand these instructions.  Will watch your condition.  Will get help right away if you are not doing well or get worse. Document Released: 04/21/2005 Document Revised: 07/14/2011 Document Reviewed:  07/11/2010 [facility] Patient Information 2014 [place], [place].

## 2013-07-27 ENCOUNTER — Telehealth: Payer: Self-pay | Admitting: Nurse Practitioner

## 2013-07-27 NOTE — Telephone Encounter (Signed)
Relevant patient education assigned to patient using Emmi. ° °

## 2013-08-26 ENCOUNTER — Other Ambulatory Visit: Payer: Self-pay | Admitting: Family Medicine

## 2013-10-26 ENCOUNTER — Other Ambulatory Visit: Payer: Self-pay | Admitting: Family Medicine

## 2013-11-16 ENCOUNTER — Other Ambulatory Visit: Payer: Self-pay | Admitting: *Deleted

## 2013-11-16 ENCOUNTER — Encounter: Payer: Self-pay | Admitting: Nurse Practitioner

## 2013-11-16 ENCOUNTER — Ambulatory Visit (INDEPENDENT_AMBULATORY_CARE_PROVIDER_SITE_OTHER): Payer: BC Managed Care – PPO | Admitting: Nurse Practitioner

## 2013-11-16 VITALS — BP 132/75 | HR 91 | Temp 100.2°F | Ht 65.0 in | Wt 112.0 lb

## 2013-11-16 DIAGNOSIS — B353 Tinea pedis: Secondary | ICD-10-CM

## 2013-11-16 DIAGNOSIS — N10 Acute tubulo-interstitial nephritis: Secondary | ICD-10-CM

## 2013-11-16 DIAGNOSIS — R81 Glycosuria: Secondary | ICD-10-CM

## 2013-11-16 LAB — BASIC METABOLIC PANEL
BUN: 9 mg/dL (ref 6–23)
CALCIUM: 9.4 mg/dL (ref 8.4–10.5)
CO2: 30 meq/L (ref 19–32)
CREATININE: 0.7 mg/dL (ref 0.4–1.2)
Chloride: 99 mEq/L (ref 96–112)
GFR: 96.21 mL/min (ref >60.00)
Glucose, Bld: 139 mg/dL — ABNORMAL HIGH (ref 70–99)
Potassium: 4.1 mEq/L (ref 3.5–5.1)
Sodium: 137 mEq/L (ref 135–145)

## 2013-11-16 LAB — URINALYSIS, ROUTINE W REFLEX MICROSCOPIC
Bilirubin Urine: NEGATIVE
Ketones, ur: NEGATIVE
NITRITE: POSITIVE — AB
PH: 6 (ref 5.0–8.0)
Specific Gravity, Urine: <=1.005 — AB (ref 1.000–1.030)
TOTAL PROTEIN, URINE-UPE24: NEGATIVE
Urine Glucose: 500 — AB
Urobilinogen, UA: 0.2 (ref 0.0–1.0)

## 2013-11-16 LAB — POCT URINALYSIS DIPSTICK
BILIRUBIN UA: NEGATIVE
Glucose, UA: 500
Ketones, UA: NEGATIVE
Nitrite, UA: NEGATIVE
Protein, UA: NEGATIVE
Spec Grav, UA: 1.015
UROBILINOGEN UA: 0.2
pH, UA: 6

## 2013-11-16 LAB — CBC WITH DIFFERENTIAL/PLATELET
BASOS ABS: 0 10*3/uL (ref 0.0–0.1)
Basophils Relative: 0.3 % (ref 0.0–3.0)
Eosinophils Absolute: 0 10*3/uL (ref 0.0–0.7)
Eosinophils Relative: 0.2 % (ref 0.0–5.0)
HEMATOCRIT: 46.7 % — AB (ref 36.0–46.0)
Hemoglobin: 15.2 g/dL — ABNORMAL HIGH (ref 12.0–15.0)
LYMPHS ABS: 2.2 10*3/uL (ref 0.7–4.0)
Lymphocytes Relative: 18.2 % (ref 12.0–46.0)
MCHC: 32.6 g/dL (ref 30.0–36.0)
MCV: 97 fl (ref 78.0–100.0)
MONOS PCT: 10.3 % (ref 3.0–12.0)
Monocytes Absolute: 1.2 10*3/uL — ABNORMAL HIGH (ref 0.1–1.0)
Neutro Abs: 8.5 10*3/uL — ABNORMAL HIGH (ref 1.4–7.7)
Neutrophils Relative %: 71 % (ref 43.0–77.0)
Platelets: 213 10*3/uL (ref 150.0–400.0)
RBC: 4.82 Mil/uL (ref 3.87–5.11)
RDW: 13.6 % (ref 11.5–15.5)
WBC: 12 10*3/uL — ABNORMAL HIGH (ref 4.0–10.5)

## 2013-11-16 LAB — HEMOGLOBIN A1C: Hgb A1c MFr Bld: 6 % (ref 4.6–6.5)

## 2013-11-16 MED ORDER — CEFDINIR 300 MG PO CAPS: 300.0000 mg | ORAL_CAPSULE | Freq: Two times a day (BID) | ORAL | Status: DC

## 2013-11-16 MED ORDER — CLOTRIMAZOLE 1 % EX OINT: TOPICAL_OINTMENT | CUTANEOUS | Status: DC

## 2013-11-16 MED ORDER — CEFTRIAXONE SODIUM 1 G IJ SOLR
1.0000 g | INTRAMUSCULAR | Status: DC
  Administered 2013-11-16: 1 g via INTRAMUSCULAR

## 2013-11-16 NOTE — Assessment & Plan Note (Signed)
IM rocephin X 1 today. Start cefdinir X 9 days tomorrow. Fluids. Consider referral to urology. F/u in 5 days.

## 2013-11-16 NOTE — Assessment & Plan Note (Signed)
A1C, BMET, Urine micro.

## 2013-11-16 NOTE — Assessment & Plan Note (Signed)
Clotrimazole cream BID X 2-4 weeks. Adjunctive: "[place] Tea Tree Oil" daily, daily vinegar soaks X 1 mo. See derm.

## 2013-11-16 NOTE — Patient Instructions (Signed)
Start antibiotic tomorrow. Eat yogurt daily to prevent antibiotic associated diarrhea. Sip hydrating fluids every hour. Take 500 mg vitamin C twice daily with meals. Come back Monday for re-evaluation or sooner if you feel worse.  Feet: use cream twice daily for 2-4 weeks. Also you may apply "[place] Tea Tree Oil" daily. Follow up in 3 weeks or see dermatology.

## 2013-11-16 NOTE — Progress Notes (Signed)
Subjective:     [name] is a 58 y.o. female presents for c/o dysuria, suprapubic pain, L flank pain, & fever since yesterday. She denies nausea & vomiting. This is 3rd incidence of pyelo in 1 yr. She had abd CT in 2009 that showed chronic vesicouretural reflux & bilat renal scarring L worse than R. No comment on these findings on subsequent scan 2014.  She is also c/o unresolved foot rash. I prescribed 0.1% triamcinolone cream in past & rash got worse. She used antifungal cream for 1 week with no improvement. She has tried multiple types of oils-coconut, tea tree, Vitamin A capsules with no relief.    The following portions of the patient's history were reviewed and updated as appropriate: allergies, current medications, past family history, past medical history, past social history, past surgical history and problem list.  Review of Systems Pertinent items are noted in HPI.    Objective:    BP 132/75  Pulse 91  Temp(Src) 100.2 F (37.9 C) (Oral)  Ht 5\' 5"  (1.651 m)  Wt 112 lb (50.803 kg)  BMI 18.64 kg/m2  SpO2 98% BP 132/75  Pulse 91  Temp(Src) 100.2 F (37.9 C) (Oral)  Ht 5\' 5"  (1.651 m)  Wt 112 lb (50.803 kg)  BMI 18.64 kg/m2  SpO2 98% General appearance: alert, cooperative, appears stated age and no distress Head: Normocephalic, without obvious abnormality, atraumatic Eyes: negative findings: lids and lashes normal and conjunctivae and sclerae normal Ears: normal TM and external ear canal left ear and abnormal TM right ear - TM vessels injected, no bulge or retraction, bones visible Back: L CVA tenderness Lungs: diminished breath sounds bibasilar and bilaterally and wet sounding cough Heart: regular rate and rhythm, S1, S2 normal, no murmur, click, rub or gallop Abdomen: abnormal findings:  No HSM, no masses, tender over L kidney & LLQ Skin: cracked, scaly, pink skin in arches of both feet. rash covers entire arch. few scabs noted Lymph nodes: No cervical or [place] LAD     Assessment:     1. Glucosuria DD: DM, nephropathy secondary to multiple UTI or congenital reflux - Hemoglobin A1c - Urinalysis, Routine w reflex microscopic  2. Acute pyelonephritis 3rd incidence in 1 yr. - POCT urinalysis dipstick-blood, glucose, leuks, cloudy - cefTRIAXone (ROCEPHIN) injection 1 g; Inject 1 g into the muscle daily. - Urine culture - CBC with Differential - Basic metabolic panel - cefdinir (OMNICEF) 300 MG capsule; Take 1 capsule (300 mg total) by mouth 2 (two) times daily.  Dispense: 18 capsule; Refill: 0 - Urinalysis, Routine w reflex microscopic  3. Tinea pedis of both feet DD: pustular psoriasis, eczema - Clotrimazole 1 % OINT; Apply twice daily to rash. Use for 2 to 4 weeks.  Dispense: 60 g; Refill: 1  See problem list for complete A&P See pt instructions. F.u 5 days for UTI; 3 wks for rash on feet.

## 2013-11-16 NOTE — Progress Notes (Signed)
Pre visit review using our clinic review tool, if applicable. No additional management support is needed unless otherwise documented below in the visit note. 

## 2013-11-17 ENCOUNTER — Telehealth: Payer: Self-pay | Admitting: Nurse Practitioner

## 2013-11-17 NOTE — Telephone Encounter (Signed)
If she is no longer having back pain or fever, she does not need to come in Monday, but let her know we will call to check on her. Please make note to yourself to call her Monday.

## 2013-11-17 NOTE — Telephone Encounter (Signed)
Left detailed message on pt's vm. Per pt.

## 2013-11-17 NOTE — Telephone Encounter (Signed)
Concern for glucosuria-never had glucose in urine before when had pyelo. A1C -6.0 SG <1.005, never had dilute urine w/UTI before. Concerned about nephritis. Vesicoureteral reflux & renal scarring viewed on 2009 & 2014 CT scans. Treating for pyelo now-possible cause of glucosuria. Will check urine again in 2 weeks, if still spilling glucose & dilute urine once infection clears, will refer to nephrology.

## 2013-11-17 NOTE — Telephone Encounter (Signed)
Patient returned call and was given results. Patient wanted to know if she also still needs to come in on Monday 11/21/13 to check urine. Patient stated that she is feeling much better.

## 2013-11-18 LAB — URINE CULTURE: Colony Count: >=100000

## 2013-11-22 NOTE — Telephone Encounter (Signed)
Please call pt, advise it is too soon to recheck urine-it isn't going to show anything because she just finished ABX. Best evaluation to see if infection is cleared is to check urine 3 to 4 weeks after finishing antibiotics.

## 2013-11-22 NOTE — Telephone Encounter (Signed)
LMOVM for pt to return call 

## 2013-11-22 NOTE — Telephone Encounter (Signed)
Patient came by and dropped off her urine. Called pt to see if her symptoms have improved. Pt stated that she is not having any fever or back pain. However, pt wanted to have her urine rechecked.

## 2013-12-13 NOTE — Telephone Encounter (Signed)
Patient was notified. Patient will do a f/u urine to recheck glucose. Patient stated that she will drop by to do urine sample.

## 2014-01-26 ENCOUNTER — Encounter: Payer: Self-pay | Admitting: Family Medicine

## 2014-01-26 ENCOUNTER — Ambulatory Visit (INDEPENDENT_AMBULATORY_CARE_PROVIDER_SITE_OTHER): Payer: BC Managed Care – PPO | Admitting: Family Medicine

## 2014-01-26 VITALS — BP 135/82 | HR 96 | Temp 98.4°F | Wt 112.8 lb

## 2014-01-26 DIAGNOSIS — J441 Chronic obstructive pulmonary disease with (acute) exacerbation: Secondary | ICD-10-CM

## 2014-01-26 DIAGNOSIS — J069 Acute upper respiratory infection, unspecified: Secondary | ICD-10-CM

## 2014-01-26 DIAGNOSIS — Z23 Encounter for immunization: Secondary | ICD-10-CM

## 2014-01-26 MED ORDER — CEFUROXIME AXETIL 500 MG PO TABS: ORAL_TABLET | ORAL | Status: DC

## 2014-01-26 MED ORDER — PREDNISONE 20 MG PO TABS: ORAL_TABLET | ORAL | Status: DC

## 2014-01-26 NOTE — Progress Notes (Signed)
Pre visit review using our clinic review tool, if applicable. No additional management support is needed unless otherwise documented below in the visit note. 

## 2014-01-26 NOTE — Progress Notes (Signed)
OFFICE NOTE  01/26/2014  CC:  Chief Complaint  Patient presents with  . URI   HPI: Patient is a 58 y.o. Caucasian female who is here for respiratory symptoms. Onset about 10-12 days ago with runny nose, cough, HA. Recently started wheezing and having temp 100-101.   Took tylenol this morning.  Mucinex plain, occ albut, nonsedating antihist, flonase, singulair.  Rock and rye as well.   Pertinent PMH:  +Tob abuse. +COPD  MEDS: Flonase qd, ventolin prn   PE: Blood pressure 135/82, pulse 96, temperature 98.4 F (36.9 C), temperature source Oral, weight 112 lb 12.8 oz (51.166 kg), SpO2 91.00%. VS: noted--normal. Gen: alert, NAD, NONTOXIC APPEARING. HEENT: eyes without injection, drainage, or swelling.  Ears: EACs clear, TMs with normal light reflex and landmarks.  Nose: Clear rhinorrhea, with some dried, crusty exudate adherent to mildly injected mucosa.  No purulent d/c.  No paranasal sinus TTP.  No facial swelling.  Throat and mouth without focal lesion.  No pharyngial swelling, erythema, or exudate.   Neck: supple, no LAD.   LUNGS: Diffuse soft exp wheezing, mildly prolonged expiratory phase, nonlabored resps.  No crackles. CV: RRR, no m/r/g. EXT: no c/c/e SKIN: no rash  IMPRESSION AND PLAN:  Prolonged URI, now with acute exacerbation of COPD. Prednisone 40mg  qd x 5d, then 20mg  qd x 5d. She says "Z pack never works" when I told her that was what I was going to rx, so I chose to rx ceftin 500 mg bid instead. She says she has taken penicillins w/out problem and says I should take these meds OFF of her allergies list. Continue current symptomatic meds but switch from plain mucinex to mucinex DM. Flu vaccine IM today. Encouraged smoking cessation.  An After Visit Summary was printed and given to the patient.  FOLLOW UP: prn

## 2014-02-09 ENCOUNTER — Other Ambulatory Visit: Payer: Self-pay | Admitting: Family Medicine

## 2014-02-09 ENCOUNTER — Ambulatory Visit (HOSPITAL_BASED_OUTPATIENT_CLINIC_OR_DEPARTMENT_OTHER)
Admission: RE | Admit: 2014-02-09 | Discharge: 2014-02-09 | Disposition: A | Discharge status: Home / Self Care | Payer: BC Managed Care – PPO | Source: Ambulatory Visit | Attending: Family Medicine | Admitting: Family Medicine

## 2014-02-09 ENCOUNTER — Encounter (HOSPITAL_BASED_OUTPATIENT_CLINIC_OR_DEPARTMENT_OTHER): Payer: Self-pay

## 2014-02-09 ENCOUNTER — Telehealth: Payer: Self-pay | Admitting: Nurse Practitioner

## 2014-02-09 DIAGNOSIS — R059 Cough, unspecified: Secondary | ICD-10-CM

## 2014-02-09 DIAGNOSIS — R05 Cough: Secondary | ICD-10-CM | POA: Insufficient documentation

## 2014-02-09 DIAGNOSIS — R509 Fever, unspecified: Secondary | ICD-10-CM

## 2014-02-09 DIAGNOSIS — J449 Chronic obstructive pulmonary disease, unspecified: Secondary | ICD-10-CM | POA: Diagnosis not present

## 2014-02-09 MED ORDER — LEVOFLOXACIN 500 MG PO TABS: 500.0000 mg | ORAL_TABLET | Freq: Every day | ORAL | Status: DC

## 2014-02-09 NOTE — Telephone Encounter (Signed)
CXR ordered 

## 2014-02-09 NOTE — Telephone Encounter (Signed)
Levaquin (antibiotic) sent to her pharmacy. If she agrees to CXR-make sure she's ok with [facility].-thx

## 2014-02-09 NOTE — Telephone Encounter (Signed)
Tell her I recommend a chest x-ray and I'll also send in a new antibiotic. Any wheezing?

## 2014-02-09 NOTE — Telephone Encounter (Signed)
Pt is okay w/ getting CXR at [place].  Patient aware of new antibiotic at pharmacy.

## 2014-02-09 NOTE — Telephone Encounter (Signed)
Patient is still running low grade fever & is coughing green mucus. Can she try a different antibiotic?

## 2014-02-09 NOTE — Telephone Encounter (Signed)
Please advise 

## 2014-02-10 ENCOUNTER — Other Ambulatory Visit: Payer: Self-pay | Admitting: Family Medicine

## 2014-02-10 MED ORDER — LEVOFLOXACIN 500 MG PO TABS: 500.0000 mg | ORAL_TABLET | Freq: Every day | ORAL | Status: DC

## 2014-04-12 ENCOUNTER — Ambulatory Visit (INDEPENDENT_AMBULATORY_CARE_PROVIDER_SITE_OTHER): Payer: BC Managed Care – PPO | Admitting: Nurse Practitioner

## 2014-04-12 ENCOUNTER — Encounter: Payer: Self-pay | Admitting: Nurse Practitioner

## 2014-04-12 VITALS — BP 131/86 | HR 96 | Temp 98.2°F | Resp 18 | Ht 65.0 in | Wt 112.0 lb

## 2014-04-12 DIAGNOSIS — N3001 Acute cystitis with hematuria: Secondary | ICD-10-CM

## 2014-04-12 DIAGNOSIS — R350 Frequency of micturition: Secondary | ICD-10-CM

## 2014-04-12 LAB — POCT URINALYSIS DIPSTICK
Glucose, UA: NEGATIVE
NITRITE UA: NEGATIVE
PH UA: 5.5
PROTEIN UA: 100
Spec Grav, UA: 1.02
UROBILINOGEN UA: 0.2

## 2014-04-12 MED ORDER — NITROFURANTOIN MONOHYD MACRO 100 MG PO CAPS: 100.0000 mg | ORAL_CAPSULE | Freq: Two times a day (BID) | ORAL | Status: DC

## 2014-04-12 NOTE — Progress Notes (Signed)
   Subjective:    [name], female    DOB: [birth year], 58 y.o.   MRN: [id number]  HPI Comments: Mentions that she has sinus congestion & "twinges of pain in ear from time to time.  Urinary Frequency  This is a recurrent problem. The current episode started in the past 7 days (3 days). The problem occurs every urination. The problem has been unchanged. The quality of the pain is described as aching. There has been no fever. She is sexually active. There is a history of pyelonephritis. Associated symptoms include frequency and urgency. Pertinent negatives include no chills, discharge, flank pain, hematuria, hesitancy, nausea or vomiting. She has tried nothing for the symptoms.      Review of Systems  Constitutional: Negative for fever, chills and fatigue.  HENT: Positive for ear pain and postnasal drip.   Gastrointestinal: Negative for nausea and vomiting.  Genitourinary: Positive for urgency and frequency. Negative for hesitancy, hematuria and flank pain.       Objective:   Physical Exam  Constitutional: She is oriented to person, place, and time. She appears well-developed and well-nourished. No distress.  HENT:  Head: Normocephalic and atraumatic.  Right Ear: External ear normal.  Left Ear: External ear normal.  L TM vessels injected, bones visible. RTM nml  Eyes: Conjunctivae are normal. Right eye exhibits no discharge. Left eye exhibits no discharge.  Cardiovascular: Normal rate.   Pulmonary/Chest: Effort normal. No respiratory distress.  Abdominal: Soft. She exhibits no distension and no mass. There is no tenderness. There is no rebound and no guarding.  Neurological: She is alert and oriented to person, place, and time.  Skin: Skin is warm and dry.  Psychiatric: She has a normal mood and affect. Her behavior is normal. Thought content normal.  Vitals reviewed.         Assessment & Plan:  1. Urinary frequency - POCT urinalysis dipstick-lg blood,  leuks, bili, trace ketones - Urine culture  2. Acute cystitis with hematuria Recurrent Had urology w/u in past - nitrofurantoin, macrocrystal-monohydrate, (MACROBID) 100 MG capsule; Take 1 capsule (100 mg total) by mouth 2 (two) times daily.  Dispense: 10 capsule; Refill: 0 F/u PRN

## 2014-04-12 NOTE — Progress Notes (Signed)
Pre visit review using our clinic review tool, if applicable. No additional management support is needed unless otherwise documented below in the visit note. 

## 2014-04-12 NOTE — Patient Instructions (Addendum)
Start antibiotic. Our office will call if we need to change the antibiotic. Sip hydrating fluids (water, juice, colorless soda, decaff tea) every hour to flush kidneys. Let us know if you are not feeling better within 4 days.  For best sinus health. Use neil med sinus rinse daily for about 1 week, or until sinus drainage clears.  Urinary Tract Infection Urinary tract infections (UTIs) can develop anywhere along your urinary tract. Your urinary tract is your body's drainage system for removing wastes and extra water. Your urinary tract includes two kidneys, two ureters, a bladder, and a urethra. Your kidneys are a pair of bean-shaped organs. Each kidney is about the size of your fist. They are located below your ribs, one on each side of your spine. CAUSES Infections are caused by microbes, which are microscopic organisms, including fungi, viruses, and bacteria. These organisms are so small that they can only be seen through a microscope. Bacteria are the microbes that most commonly cause UTIs. SYMPTOMS  Symptoms of UTIs may vary by age and gender of the patient and by the location of the infection. Symptoms in young women typically include a frequent and intense urge to urinate and a painful, burning feeling in the bladder or urethra during urination. Older women and men are more likely to be tired, shaky, and weak and have muscle aches and abdominal pain. A fever may mean the infection is in your kidneys. Other symptoms of a kidney infection include pain in your back or sides below the ribs, nausea, and vomiting. DIAGNOSIS To diagnose a UTI, your caregiver will ask you about your symptoms. Your caregiver also will ask to provide a urine sample. The urine sample will be tested for bacteria and white blood cells. White blood cells are made by your body to help fight infection. TREATMENT  Typically, UTIs can be treated with medication. Because most UTIs are caused by a bacterial infection, they usually can  be treated with the use of antibiotics. The choice of antibiotic and length of treatment depend on your symptoms and the type of bacteria causing your infection. HOME CARE INSTRUCTIONS  If you were prescribed antibiotics, take them exactly as your caregiver instructs you. Finish the medication even if you feel better after you have only taken some of the medication.  Drink enough water and fluids to keep your urine clear or pale yellow.  Avoid caffeine, tea, and carbonated beverages. They tend to irritate your bladder.  Empty your bladder often. Avoid holding urine for long periods of time.  Empty your bladder before and after sexual intercourse.  After a bowel movement, women should cleanse from front to back. Use each tissue only once. SEEK MEDICAL CARE IF:   You have back pain.  You develop a fever.  Your symptoms do not begin to resolve within 3 days. SEEK IMMEDIATE MEDICAL CARE IF:   You have severe back pain or lower abdominal pain.  You develop chills.  You have nausea or vomiting.  You have continued burning or discomfort with urination. MAKE SURE YOU:   Understand these instructions.  Will watch your condition.  Will get help right away if you are not doing well or get worse. Document Released: 01/29/2005 Document Revised: 10/21/2011 Document Reviewed: 05/30/2011 [facility] Patient Information 2014 [place].

## 2014-04-16 LAB — URINE CULTURE: Colony Count: 70000

## 2014-04-17 ENCOUNTER — Encounter: Payer: Self-pay | Admitting: Nurse Practitioner

## 2014-04-17 ENCOUNTER — Ambulatory Visit (INDEPENDENT_AMBULATORY_CARE_PROVIDER_SITE_OTHER): Payer: BC Managed Care – PPO | Admitting: Nurse Practitioner

## 2014-04-17 VITALS — BP 128/78 | HR 87 | Temp 99.9°F | Ht 65.0 in | Wt 109.0 lb

## 2014-04-17 DIAGNOSIS — N10 Acute tubulo-interstitial nephritis: Secondary | ICD-10-CM

## 2014-04-17 MED ORDER — CEFTRIAXONE SODIUM 1 G IJ SOLR
1.0000 g | Freq: Once | INTRAMUSCULAR | Status: AC
  Administered 2014-04-17: 1 g via INTRAMUSCULAR

## 2014-04-17 MED ORDER — CEFDINIR 300 MG PO CAPS: 300.0000 mg | ORAL_CAPSULE | Freq: Two times a day (BID) | ORAL | Status: DC

## 2014-04-17 NOTE — Progress Notes (Signed)
   Subjective:    [name], female    DOB: 03-20-1956, 58 y.o.   MRN: [id number]  Urinary Tract Infection  This is a new (frequency resolved, flank pain worse) problem. The current episode started in the past 7 days (5 days). The problem has been gradually worsening. The quality of the pain is described as aching (L flank). The pain is moderate (worse when walking). Maximum temperature: feels feverish. She is sexually active. There is a history of pyelonephritis. Associated symptoms include chills and flank pain. Pertinent negatives include no frequency, hesitancy, nausea, urgency or vomiting. Treatments tried: just finished 5 days macrobid. The treatment provided mild relief.      Review of Systems  Constitutional: Positive for fever and chills. Negative for fatigue.  Gastrointestinal: Negative for nausea, vomiting and abdominal pain.  Genitourinary: Positive for flank pain. Negative for dysuria, hesitancy, urgency and frequency.       Objective:   Physical Exam  Constitutional: She is oriented to person, place, and time. She appears well-developed and well-nourished.  HENT:  Head: Normocephalic and atraumatic.  Eyes: Conjunctivae are normal. Right eye exhibits no discharge. Left eye exhibits no discharge.  Cardiovascular: Normal rate.   Pulmonary/Chest: Effort normal. No respiratory distress.  Musculoskeletal: She exhibits tenderness (L CVA tender).  Neurological: She is alert and oriented to person, place, and time.  Skin: Skin is warm and dry.  Psychiatric: She has a normal mood and affect. Her behavior is normal. Thought content normal.  Vitals reviewed.         Assessment & Plan:  1. Acute pyelonephritis Worse on macrobid - cefTRIAXone (ROCEPHIN) injection 1 g; Inject 1 g into the muscle once. - cefdinir (OMNICEF) 300 MG capsule; Take 1 capsule (300 mg total) by mouth 2 (two) times daily.  Dispense: 14 capsule; Refill: 0 Pt to come in tomorrow if  feverish or still having flank pain-will give rocephin #2.

## 2014-04-17 NOTE — Progress Notes (Signed)
Pre visit review using our clinic review tool, if applicable. No additional management support is needed unless otherwise documented below in the visit note. 

## 2014-04-17 NOTE — Patient Instructions (Addendum)
Start antibiotics tomorrow. If you have fever or still have flank pain, come in for appointment.  Take tylenol if needed for pain or fever.  Sip fluids every hour.

## 2014-04-18 ENCOUNTER — Ambulatory Visit (INDEPENDENT_AMBULATORY_CARE_PROVIDER_SITE_OTHER): Payer: BC Managed Care – PPO

## 2014-04-18 DIAGNOSIS — N3090 Cystitis, unspecified without hematuria: Secondary | ICD-10-CM | POA: Diagnosis not present

## 2014-04-18 MED ORDER — CEFTRIAXONE SODIUM 1 G IJ SOLR
1.0000 g | Freq: Once | INTRAMUSCULAR | Status: AC
  Administered 2014-04-18: 1 g via INTRAMUSCULAR

## 2014-06-05 ENCOUNTER — Ambulatory Visit (INDEPENDENT_AMBULATORY_CARE_PROVIDER_SITE_OTHER): Payer: Self-pay | Admitting: Family Medicine

## 2014-06-05 ENCOUNTER — Encounter: Payer: Self-pay | Admitting: Family Medicine

## 2014-06-05 VITALS — BP 117/77 | HR 102 | Temp 100.0°F | Resp 18 | Ht 65.0 in | Wt 110.0 lb

## 2014-06-05 DIAGNOSIS — N10 Acute tubulo-interstitial nephritis: Secondary | ICD-10-CM

## 2014-06-05 DIAGNOSIS — R350 Frequency of micturition: Secondary | ICD-10-CM

## 2014-06-05 DIAGNOSIS — J018 Other acute sinusitis: Secondary | ICD-10-CM

## 2014-06-05 LAB — POCT URINALYSIS DIPSTICK
Bilirubin, UA: NEGATIVE
Glucose, UA: NEGATIVE
Ketones, UA: 15
NITRITE UA: POSITIVE
PH UA: 6
Protein, UA: 30
SPEC GRAV UA: 1.025
UROBILINOGEN UA: 1

## 2014-06-05 MED ORDER — CEFDINIR 300 MG PO CAPS
300.0000 mg | ORAL_CAPSULE | Freq: Two times a day (BID) | ORAL | Status: DC
Start: 2014-06-05 — End: 2014-06-21

## 2014-06-05 NOTE — Progress Notes (Signed)
OFFICE NOTE  06/05/2014  CC:  Chief Complaint  Patient presents with  . Urinary Frequency    x yesterday  . Nasal Congestion    x 3 weeks      HPI: Patient is a 59 y.o. Caucasian female who is here for 2 complaints: 3 wks of gradually worsening URI sx's, thick nasal d/c that is mainly PND, some cough but no big change from her chronic cough from smoking.  +upper teeth pain, facial pressure, HA.  No SOB.  Also 1 day hx of frequency, urgency, also a constant, gradually worseing pulling type pain on left lower side of abdomen approaching the CVA area but not quite there-she says this is classic for her UTI's.  +Nausea, but no vomiting.  +Subjective fever.  No AZO taken.  Pertinent PMH:  Past medical, surgical, social, and family history reviewed and no changes are noted since last office visit.  MEDS:  Not taking omnicef listed below, not taking flonase or singulair with any regularity Outpatient Prescriptions Prior to Visit  Medication Sig Dispense Refill  . fluticasone (FLONASE) 50 MCG/ACT nasal spray as needed    . montelukast (SINGULAIR) 10 MG tablet   4  . Multiple Vitamin (MULTIVITAMIN) tablet Take 1 tablet by mouth daily.    . VENTOLIN HFA 108 (90 BASE) MCG/ACT inhaler INHALE 2 PUFFS INTO THE LUNGS EVERY 6 (SIX) HOURS AS NEEDED FOR WHEEZING. 6.7 each 3  . cefdinir (OMNICEF) 300 MG capsule Take 1 capsule (300 mg total) by mouth 2 (two) times daily. 14 capsule 0  . Clotrimazole 1 % OINT Apply twice daily to rash. Use for 2 to 4 weeks. 60 g 1   No facility-administered medications prior to visit.    PE: Blood pressure 117/77, pulse 102, temperature 100 F (37.8 C), temperature source Temporal, resp. rate 18, height 5\' 5"  (1.651 m), weight 110 lb (49.896 kg), SpO2 96 %. VS: noted--normal. Gen: alert, NAD, NONTOXIC APPEARING. HEENT: eyes without injection, drainage, or swelling.  Ears: EACs clear, TMs with normal light reflex and landmarks.  Nose: Clear rhinorrhea, with some  dried, crusty exudate adherent to mildly injected mucosa.  No purulent d/c.  Mild diffuse paranasal sinus TTP.  No facial swelling.  Throat and mouth without focal lesion.  No pharyngial swelling, erythema, or exudate.   Neck: supple, no LAD.   LUNGS: CTA bilat, nonlabored resps.   CV: RRR, no m/r/g. EXT: no c/c/e SKIN: no rash  CC UA today: + nitrite, +large blood, Leu trace, Ket 15 mg/dl, SG 1.025, otherwise normal.  IMPRESSION AND PLAN:  1) UTI, possible pyelonephritis. Send urine for c/s.  Omnicef 300mg  bid x 10d.  2) Acute (recurrent) rhinosinusitis: tobacco irritant is a problem and she is encouraged to quit. Abx as above in prob #1. Saline nasal rinse.  Take flonase daily as prescribed.  An After Visit Summary was printed and given to the patient.  FOLLOW UP: prn

## 2014-06-05 NOTE — Progress Notes (Signed)
Pre visit review using our clinic review tool, if applicable. No additional management support is needed unless otherwise documented below in the visit note. 

## 2014-06-08 LAB — URINE CULTURE: Colony Count: >=100000

## 2014-06-20 ENCOUNTER — Ambulatory Visit (INDEPENDENT_AMBULATORY_CARE_PROVIDER_SITE_OTHER): Payer: BLUE CROSS/BLUE SHIELD | Admitting: Nurse Practitioner

## 2014-06-20 ENCOUNTER — Encounter: Payer: Self-pay | Admitting: Nurse Practitioner

## 2014-06-20 ENCOUNTER — Ambulatory Visit (HOSPITAL_BASED_OUTPATIENT_CLINIC_OR_DEPARTMENT_OTHER)
Admission: RE | Admit: 2014-06-20 | Discharge: 2014-06-20 | Disposition: A | Discharge status: Home / Self Care | Payer: BLUE CROSS/BLUE SHIELD | Source: Ambulatory Visit | Attending: Nurse Practitioner | Admitting: Nurse Practitioner

## 2014-06-20 VITALS — BP 125/81 | HR 86 | Temp 99.6°F | Resp 18 | Ht 65.0 in | Wt 112.0 lb

## 2014-06-20 DIAGNOSIS — F17210 Nicotine dependence, cigarettes, uncomplicated: Secondary | ICD-10-CM | POA: Insufficient documentation

## 2014-06-20 DIAGNOSIS — R0989 Other specified symptoms and signs involving the circulatory and respiratory systems: Secondary | ICD-10-CM | POA: Diagnosis not present

## 2014-06-20 DIAGNOSIS — J449 Chronic obstructive pulmonary disease, unspecified: Secondary | ICD-10-CM | POA: Insufficient documentation

## 2014-06-20 DIAGNOSIS — R05 Cough: Secondary | ICD-10-CM

## 2014-06-20 DIAGNOSIS — Z8701 Personal history of pneumonia (recurrent): Secondary | ICD-10-CM | POA: Insufficient documentation

## 2014-06-20 DIAGNOSIS — R059 Cough, unspecified: Secondary | ICD-10-CM

## 2014-06-20 NOTE — Progress Notes (Signed)
Subjective:     [name] is a 59 y.o. female who presents for evaluation of chills, headache, sore glands in L side neck, nasal congestion for 3 days. Pt was seen in ofc 15 days ago for cough X 3 weeks & flank pain. Urine Cx was pos for e. Coli. She was treated w/cefdinir for 10 days. Pt states UTI cleared. URI symptoms & cough are persistent. Post-nasal drip is thick & she chokes on it. Large blood tinged clot drained to back of throat few days ago. She is not using sinus rinse. Reports "sinus trouble" in her early 20's. Past history is significant for asthma, frequent episodes of bronchitis, COPD, pneumonia and chest CT 2015 showed mild progressive mediastinal LAD. Last episode pneumonia was 10/'15-bilat LL. She has not had f/u CXR. She is taking benadryl most days due to itchy patch on bottom of feet.  The following portions of the patient's history were reviewed and updated as appropriate: allergies, current medications, past medical history, past social history, past surgical history and problem list.  Review of Systems Ears, nose, mouth, throat, and face: positive for sore throat Respiratory: negative for wheezing Cardiovascular: negative for chest pain and palpitations Gastrointestinal: negative for abdominal pain, constipation and diarrhea Neurological: positive for headaches, negative for coordination problems, dizziness, gait problems and weakness    Objective:    BP 125/81 mmHg  Pulse 86  Temp(Src) 99.6 F (37.6 C) (Temporal)  Resp 18  Ht 5\' 5"  (1.651 m)  Wt 112 lb (50.803 kg)  BMI 18.64 kg/m2  SpO2 94% General appearance: alert, cooperative, appears stated age and no distress Head: Normocephalic, without obvious abnormality, atraumatic Eyes: negative findings: lids and lashes normal and conjunctivae and sclerae normal Ears: normal TM's and external ear canals both ears Nose: R nasal mucosa difffusely swollen. erythematous bilat. Throat: lips, mucosa, and tongue  normal; teeth and gums normal Neck: supple, symmetrical, trachea midline and L side neck appears slightly larger than R, but no LAD palpable Lungs: very diminished BS throughout-abdominal breather Heart: regular rate and rhythm, S1, S2 normal, no murmur, click, rub or gallop Abdomen: soft, non-tender; bowel sounds normal; no masses,  no organomegaly   Neuro: Neg kernig & Brudzinski's   Assessment: Plan   1. Cough likley aggravated by post-nasal drip-worse after laid down in exam table. - DG Chest 2 View; Future If pna, will prescribe levaquin; if lungs clear, will prescribe augmentin MUST start sinus rinse twice daily for 7 to 10 days. Stop antihistamines F/u 2 weeks

## 2014-06-20 NOTE — Progress Notes (Signed)
Pre visit review using our clinic review tool, if applicable. No additional management support is needed unless otherwise documented below in the visit note. 

## 2014-06-20 NOTE — Patient Instructions (Signed)
Please get Chest xray. My office will call with results & follow up.  Stop taking antihistamines-benadryl.  Start sinus rinse. Use twice daily for minimum 7 days, longer if still coughing from post-nasal drip.  Use Braggs apple cider vinegar on foot twice daily for 1 month. Make mixture of 2 T vinegar & 3 drops tea tree oil. Apply with cotton ball. Allow to dry. Apply benadryl cream if itchy.  Plan to return in 2 weeks to re-evaluate cough & sinus drainage.

## 2014-06-21 ENCOUNTER — Telehealth: Payer: Self-pay | Admitting: Nurse Practitioner

## 2014-06-21 DIAGNOSIS — J0100 Acute maxillary sinusitis, unspecified: Secondary | ICD-10-CM

## 2014-06-21 MED ORDER — AMOXICILLIN-POT CLAVULANATE 875-125 MG PO TABS: 1.0000 | ORAL_TABLET | Freq: Two times a day (BID) | ORAL | Status: DC

## 2014-06-21 NOTE — Telephone Encounter (Signed)
pls call pt: Advise Chest xray shows she does NOT have pneumonia, but she has significant COPD which predisposes her for frequent lung infections. I would like to send her to pulmonology for best management of COPD to decrease frequency of infections. I sent augmentin to pharmacy for sinusitis. She must do sinus rinses twice daily for at least 1 week.

## 2014-06-21 NOTE — Telephone Encounter (Signed)
Patient notified of results. Patient expressed understanding. Patient stated that she already sees pulmonologist Dr. Halford Chessman. Patient stated that she will call and make an appt to see him.

## 2014-07-25 ENCOUNTER — Telehealth: Payer: Self-pay | Admitting: Nurse Practitioner

## 2014-07-25 NOTE — Telephone Encounter (Signed)
Please Advise

## 2014-07-25 NOTE — Telephone Encounter (Signed)
Called and spoke with patient she is complaining of sinus headache, earaches, coughing and blowing her nose a lot. Appointment made for 07/26/14 at 945a to be looked at.

## 2014-07-25 NOTE — Telephone Encounter (Signed)
Please call pt. Ask what her symptoms are.  Schedule OV, as she has not been seen in 4 weeks.

## 2014-07-25 NOTE — Telephone Encounter (Signed)
Patient is still sick would like a different RX called into [facility] at [place].

## 2014-07-26 ENCOUNTER — Ambulatory Visit (INDEPENDENT_AMBULATORY_CARE_PROVIDER_SITE_OTHER): Payer: BLUE CROSS/BLUE SHIELD | Admitting: Nurse Practitioner

## 2014-07-26 ENCOUNTER — Other Ambulatory Visit: Payer: Self-pay | Admitting: *Deleted

## 2014-07-26 ENCOUNTER — Encounter: Payer: Self-pay | Admitting: Nurse Practitioner

## 2014-07-26 VITALS — BP 153/82 | HR 75 | Temp 98.8°F | Ht 65.0 in | Wt 111.0 lb

## 2014-07-26 DIAGNOSIS — J0100 Acute maxillary sinusitis, unspecified: Secondary | ICD-10-CM

## 2014-07-26 DIAGNOSIS — J449 Chronic obstructive pulmonary disease, unspecified: Secondary | ICD-10-CM | POA: Diagnosis not present

## 2014-07-26 DIAGNOSIS — J441 Chronic obstructive pulmonary disease with (acute) exacerbation: Secondary | ICD-10-CM | POA: Insufficient documentation

## 2014-07-26 DIAGNOSIS — J309 Allergic rhinitis, unspecified: Secondary | ICD-10-CM | POA: Diagnosis not present

## 2014-07-26 MED ORDER — ALBUTEROL SULFATE HFA 108 (90 BASE) MCG/ACT IN AERS: INHALATION_SPRAY | RESPIRATORY_TRACT | Status: DC

## 2014-07-26 MED ORDER — AMOXICILLIN-POT CLAVULANATE 875-125 MG PO TABS: 1.0000 | ORAL_TABLET | Freq: Two times a day (BID) | ORAL | Status: DC

## 2014-07-26 MED ORDER — MONTELUKAST SODIUM 10 MG PO TABS: 10.0000 mg | ORAL_TABLET | Freq: Every day | ORAL | Status: DC

## 2014-07-26 MED ORDER — FLUTICASONE-SALMETEROL 230-21 MCG/ACT IN AERO: 2.0000 | INHALATION_SPRAY | Freq: Two times a day (BID) | RESPIRATORY_TRACT | Status: DC

## 2014-07-26 MED ORDER — FLUTICASONE PROPIONATE 50 MCG/ACT NA SUSP: 1.0000 | Freq: Two times a day (BID) | NASAL | Status: DC

## 2014-07-26 MED ORDER — AZELASTINE HCL 0.1 % NA SOLN: 1.0000 | Freq: Two times a day (BID) | NASAL | Status: DC

## 2014-07-26 NOTE — Progress Notes (Signed)
Subjective:    [name] is a 59 y.o. female presents w/increased nasal congestion & cough since "trees are budding". Current meds: singulair, zyrtec, claritin, flonase. Pt does not take all meds, she takes as she needs them in various combinations. She is a smoker & has frequent lung infections. She has seen pulm in past & diagnosed w/COPD. Current Symptoms: clear & yellow nasal drainage, ha, cough worse at night, intermittent ear pain. Denies fever, no increase in sputum, no color change, no SOB.  The following portions of the patient's history were reviewed and updated as appropriate: allergies, current medications, past medical history, past social history, past surgical history and problem list.   Review of Systems Pertinent items are noted in HPI.    Objective:    BP 153/82 mmHg  Pulse 75  Temp(Src) 98.8 F (37.1 C) (Temporal)  Ht 5\' 5"  (1.651 m)  Wt 111 lb (50.349 kg)  BMI 18.47 kg/m2  SpO2 97% General appearance: alert, cooperative, appears stated age and no distress Head: Normocephalic, without obvious abnormality, atraumatic Eyes: negative findings: lids and lashes normal and conjunctivae and sclerae normal Ears: normal TM and external ear canal left ear and abnormal TM right ear - TM vessels injected Nose: Swelling of R turbinates, nml color Throat: lips, mucosa, and tongue normal; teeth and gums normal Lymph nodes: Cervical adenopathy: none and Supraclavicular adenopathy: none Neurologic: Grossly normal    Assessment:Plan   1. Chronic obstructive pulmonary disease, unspecified COPD, unspecified chronic bronchitis type - fluticasone-salmeterol (ADVAIR HFA) 230-21 MCG/ACT inhaler; Inhale 2 puffs into the lungs 2 (two) times daily.  Dispense: 1 Inhaler; Refill: 12 Discussed potential SE instructed to rinse mouth after use 2. Acute maxillary sinusitis, recurrence not specified - amoxicillin-clavulanate (AUGMENTIN) 875-125 MG per tablet; Take 1 tablet by mouth 2  (two) times daily.  Dispense: 10 tablet; Refill: 0  3. Allergic rhinitis, unspecified allergic rhinitis type - azelastine (ASTELIN) 0.1 % nasal spray; Place 1 spray into both nostrils 2 (two) times daily. Use in each nostril as directed  Dispense: 30 mL; Refill: 12 - fluticasone (FLONASE) 50 MCG/ACT nasal spray; Place 1 spray into both nostrils 2 (two) times daily.  Dispense: 16 g; Refill: 6 - montelukast (SINGULAIR) 10 MG tablet; Take 1 tablet (10 mg total) by mouth at bedtime.  Dispense: 30 tablet; Refill: 6  Discussed regular use See pt instructions F/u 6 weeks

## 2014-07-26 NOTE — Progress Notes (Signed)
Pre visit review using our clinic review tool, if applicable. No additional management support is needed unless otherwise documented below in the visit note. 

## 2014-07-26 NOTE — Patient Instructions (Signed)
Do Not start antibiotic unless symptoms do not improve.  Start nasal antihistamine. Continue flonase Continue daily sinus FOrEVER Continue singulair Stop all claritin & zyrtec.  Start inhaler to minimize frequency of lung infections.  See me in 4 to 6 weeks to evaluate.

## 2014-09-18 ENCOUNTER — Telehealth: Payer: Self-pay | Admitting: Family Medicine

## 2014-09-18 NOTE — Telephone Encounter (Signed)
LMOM for pt to CB about mammogram. 

## 2014-09-18 NOTE — Telephone Encounter (Signed)
Pt called back and states that she goes to [facility] for her mammogram and that she is aware she is due and will be calling them to schedule.

## 2014-09-27 ENCOUNTER — Encounter: Payer: Self-pay | Admitting: Nurse Practitioner

## 2014-12-04 DIAGNOSIS — M810 Age-related osteoporosis without current pathological fracture: Secondary | ICD-10-CM

## 2014-12-04 HISTORY — PX: OTHER SURGICAL HISTORY: SHX169

## 2014-12-04 HISTORY — DX: Age-related osteoporosis without current pathological fracture: M81.0

## 2015-01-01 ENCOUNTER — Encounter: Payer: Self-pay | Admitting: Family Medicine

## 2015-02-26 ENCOUNTER — Other Ambulatory Visit: Payer: Self-pay | Admitting: Family Medicine

## 2015-02-26 MED ORDER — ALBUTEROL SULFATE HFA 108 (90 BASE) MCG/ACT IN AERS: INHALATION_SPRAY | RESPIRATORY_TRACT | Status: DC

## 2015-03-02 ENCOUNTER — Ambulatory Visit (INDEPENDENT_AMBULATORY_CARE_PROVIDER_SITE_OTHER): Payer: BLUE CROSS/BLUE SHIELD | Admitting: Family Medicine

## 2015-03-02 ENCOUNTER — Encounter: Payer: Self-pay | Admitting: Family Medicine

## 2015-03-02 VITALS — BP 115/74 | HR 79 | Temp 98.8°F | Resp 20 | Wt 111.5 lb

## 2015-03-02 DIAGNOSIS — J0100 Acute maxillary sinusitis, unspecified: Secondary | ICD-10-CM

## 2015-03-02 MED ORDER — AMOXICILLIN-POT CLAVULANATE 875-125 MG PO TABS
1.0000 | ORAL_TABLET | Freq: Two times a day (BID) | ORAL | Status: DC
Start: 2015-03-02 — End: 2015-03-23

## 2015-03-02 MED ORDER — PREDNISONE 20 MG PO TABS: 40.0000 mg | ORAL_TABLET | Freq: Every day | ORAL | Status: DC

## 2015-03-02 NOTE — Progress Notes (Signed)
   Subjective:    [name], female    DOB: Sep 06, 1955, 59 y.o.   MRN: [id number]  HPI   Chest congestion:  Patient presents with complaints of chest condition , low-grade fever, sick mucus producing cough. She states that she started to get increased symptoms around Labor Day , which was about 2 months ago , and she's been fighting off and on since.  She reports her son came from a Labor Day trip, and had a cold there is she does have COPD , continues to smoke one pack per day. She is prescribe Singulair , Advair , Flonase and albuterol. Patient states she only takes her inhalers both as needed. She takes her Singulair an nasal spray daily. She has been using Advil 4 headaches. She feels like last week her symptoms started become worse , and then she developed a low-grade fever. She is eating and drinking well. She admits to mild nausea this morning, but she states that was after having a coughing spell.  Past Medical History  Diagnosis Date  . Seasonal allergic rhinitis   . Basal cell carcinoma   . History of acute bronchitis 03/24/2011  . Meniere disorder 01/23/2012  . Recurrent pyelonephritis   . Lymphadenopathy, abdominal 03/2013    Noted on abd/pelv CT done to eval slow-to-resolve pyelo  . Osteoporosis 12/2014    Dr. Nori Riis, her GYN, dx'd her   No Known Allergies Social History   Social History  . Marital Status: Married    Spouse Name: N/A  . Number of Children: N/A  . Years of Education: N/A   Occupational History  . Not on file.   Social History Main Topics  . Smoking status: Current Every Day Smoker -- 1.00 packs/day for 29 years    Types: Cigarettes    Start date: 06/01/1978  . Smokeless tobacco: Never Used  . Alcohol Use: 2.4 oz/week    4 Glasses of wine per week  . Drug Use: No  . Sexual Activity:    Partners: Male   Other Topics Concern  . Not on file   Social History Narrative     Review of Systems Negative, with the exception of above  mentioned in HPI     Objective:   Physical Exam BP 115/74 mmHg  Pulse 79  Temp(Src) 98.8 F (37.1 C) (Oral)  Resp 20  Wt 111 lb 8 oz (50.576 kg)  SpO2 94% Gen: Afebrile. No acute distress.  HENT: AT. [place]. Bilateral TM visualized and normal in appearance. MMM. Bilateral nares with erythema and mild swelling. Throat without erythema or exudates. Mild cough on exam, no hoarseness.  Eyes:Pupils Equal Round Reactive to light, Extraocular movements intact,  Conjunctiva without redness, discharge or icterus. Neck/lymp/endocrine: Supple,mild anterior cervical lymphadenopathy CV: RRR  Chest: CTAB, no wheeze or crackles. Diminished lung sounds bilaterally. Abd: Soft. Flat. NTND. BS present. No Masses palpated.  Skin: No rashes, purpura or petechiae.       Assessment & Plan:  1. Acute maxillary sinusitis, recurrence not specified - augmentin prescribed with prednisone burst. - Flonase and nasal saline daily (BID) -  Proper use of inhalers was reviewed with patient today. Patient encouraged to taking Advair daily as prescribed. Using albuterol inhaler only for rescue. Patient is needing albuterol more than 2 times a week after restart her Advair daily, she is to come in to be seen. - f/U PRN

## 2015-03-02 NOTE — Patient Instructions (Signed)

## 2015-03-23 ENCOUNTER — Encounter: Payer: Self-pay | Admitting: Family Medicine

## 2015-03-23 ENCOUNTER — Ambulatory Visit (INDEPENDENT_AMBULATORY_CARE_PROVIDER_SITE_OTHER): Payer: BLUE CROSS/BLUE SHIELD | Admitting: Family Medicine

## 2015-03-23 VITALS — BP 148/94 | HR 114 | Temp 101.2°F | Resp 20 | Wt 112.0 lb

## 2015-03-23 DIAGNOSIS — J0101 Acute recurrent maxillary sinusitis: Secondary | ICD-10-CM | POA: Diagnosis not present

## 2015-03-23 DIAGNOSIS — R062 Wheezing: Secondary | ICD-10-CM | POA: Diagnosis not present

## 2015-03-23 MED ORDER — DOXYCYCLINE HYCLATE 100 MG PO TABS: 100.0000 mg | ORAL_TABLET | Freq: Two times a day (BID) | ORAL | Status: DC

## 2015-03-23 MED ORDER — IPRATROPIUM-ALBUTEROL 0.5-2.5 (3) MG/3ML IN SOLN
3.0000 mL | Freq: Once | RESPIRATORY_TRACT | Status: AC
  Administered 2015-03-23: 3 mL via RESPIRATORY_TRACT

## 2015-03-23 NOTE — Progress Notes (Signed)
   Subjective:    [name], female    DOB: July 12, 1955, 59 y.o.   MRN: [id number]  HPI  Acute maxillary sinusitis: Patient presents for congestion, rhinorrhea, headache, with cough for 5 days. Patient reports fever of 101.2 Fahrenheit at home. She has been taking Mucinex and Sudafed, both last emesis this morning to help with the symptoms. She states she is using her Advair daily, she has not been using her albuterol inhaler. She reports her son was sick with similar symptoms. She had a sinus infection last month, in which she was prescribed Augmentin and states she did improve with the Augmentin and prednisone burst. Patient does have COPD, and continues to smoke daily.  Current Everyday Smoker  Past Medical History  Diagnosis Date  . Seasonal allergic rhinitis   . Basal cell carcinoma   . History of acute bronchitis 03/24/2011  . Meniere disorder 01/23/2012  . Recurrent pyelonephritis   . Lymphadenopathy, abdominal 03/2013    Noted on abd/pelv CT done to eval slow-to-resolve pyelo  . Osteoporosis 12/2014    Dr. Nori Riis, her GYN, dx'd her   No Known Allergies  Review of Systems Negative, with the exception of above mentioned in HPI     Objective:   Physical Exam .BP 148/94 mmHg  Pulse 114  Temp(Src) 101.2 F (38.4 C) (Oral)  Resp 20  Wt 112 lb (50.803 kg)  SpO2 95% Gen: febrile. No acute distress. Nontoxic in appearance, well-developed, thin Caucasian female, appears older in age. HENT: AT. [place]. Bilateral TM visualized and normal in appearance. MMM. Bilateral nares with erythema, no swelling. Throat without erythema or exudates. Postnasal green drainage noted. Hoarseness present on exam. Cough present on exam. Tender to right maxillary sinus with palpation. Eyes:Pupils Equal Round Reactive to light, Extraocular movements intact,  Conjunctiva without redness, discharge or icterus. Neck/lymp/endocrine: Supple, shotty cervical lymphadenopathy CV: Tachycardic, regular  rhythm. (Sudafed use) Chest: Moderate wheezing throughout left lung fields, and right lower lobe. No crackles appreciated on exam. Abd: Soft. Flat. NTND. BS present. No Masses palpated.  Skin: No rashes, purpura or petechiae.     Assessment & Plan:  1. Wheezing - Wheezing cleared with DuoNeb treatment in the office today. Patient encouraged to use her albuterol inhaler every 6 hours, 1-2 puffs for the next 48 hours. - ipratropium-albuterol (DUONEB) 0.5-2.5 (3) MG/3ML nebulizer solution 3 mL; Take 3 mLs by nebulization once.  2. Acute recurrent maxillary sinusitis - Patient with recurrent maxillary sinusitis on exam today, with obvious greenish sinus drainage posteriorly pharynx.  - Patient encouraged to continue antihistamine, nasal steroid of choice, rest, hydration, albuterol inhaler and doxycycline. - doxycycline (VIBRA-TABS) 100 MG tablet; Take 1 tablet (100 mg total) by mouth 2 (two) times daily.  Dispense: 20 tablet; Refill: 0  Follow-up as needed

## 2015-03-23 NOTE — Patient Instructions (Signed)
Chronic Obstructive Pulmonary Disease Exacerbation Chronic obstructive pulmonary disease (COPD) is a common lung condition in which airflow from the lungs is limited. COPD is a general term that can be used to describe many different lung problems that limit airflow, including chronic bronchitis and emphysema. COPD exacerbations are episodes when breathing symptoms become much worse and require extra treatment. Without treatment, COPD exacerbations can be life threatening, and frequent COPD exacerbations can cause further damage to your lungs. CAUSES  Respiratory infections.  Exposure to smoke.  Exposure to air pollution, chemical fumes, or dust. Sometimes there is no apparent cause or trigger. RISK FACTORS  Smoking cigarettes.  Older age.  Frequent prior COPD exacerbations. SIGNS AND SYMPTOMS  Increased coughing.  Increased thick spit (sputum) production.  Increased wheezing.  Increased shortness of breath.  Rapid breathing.  Chest tightness. DIAGNOSIS Your medical history, a physical exam, and tests will help your health care provider make a diagnosis. Tests may include:  A chest X-ray.  Basic lab tests.  Sputum testing.  An arterial blood gas test. TREATMENT Depending on the severity of your COPD exacerbation, you may need to be admitted to a hospital for treatment. Some of the treatments commonly used to treat COPD exacerbations are:   Antibiotic medicines.  Bronchodilators. These are drugs that expand the air passages. They may be given with an inhaler or nebulizer. Spacer devices may be needed to help improve drug delivery.  Corticosteroid medicines.  Supplemental oxygen therapy.  Airway clearing techniques, such as noninvasive ventilation (NIV) and positive expiratory pressure (PEP). These provide respiratory support through a mask or other noninvasive device. HOME CARE INSTRUCTIONS  Do not smoke. Quitting smoking is very important to prevent COPD from  getting worse and exacerbations from happening as often.  Avoid exposure to all substances that irritate the airway, especially to tobacco smoke.  If you were prescribed an antibiotic medicine, finish it all even if you start to feel better.  Take all medicines as directed by your health care provider.It is important to use correct technique with inhaled medicines.  Drink enough fluids to keep your urine clear or pale yellow (unless you have a medical condition that requires fluid restriction).  Use a cool mist vaporizer. This makes it easier to clear your chest when you cough.  If you have a home nebulizer and oxygen, continue to use them as directed.  Maintain all necessary vaccinations to prevent infections.  Exercise regularly.  Eat a healthy diet.  Keep all follow-up appointments as directed by your health care provider. SEEK IMMEDIATE MEDICAL CARE IF:  You have worsening shortness of breath.  You have trouble talking.  You have severe chest pain.  You have blood in your sputum.  You have a fever.  You have weakness, vomit repeatedly, or faint.  You feel confused.  You continue to get worse. MAKE SURE YOU:  Understand these instructions.  Will watch your condition.  Will get help right away if you are not doing well or get worse.   This information is not intended to replace advice given to you by your health care provider. Make sure you discuss any questions you have with your health care provider.   Document Released: 02/16/2007 Document Revised: 05/12/2014 Document Reviewed: 12/24/2012 Elsevier Interactive Patient Education 2016 Else[employer]  Sinusitis, Adult Sinusitis is redness, soreness, and inflammation of the paranasal sinuses. Paranasal sinuses are air pockets within the bones of your face. They are located beneath your eyes, in the middle of your  forehead, and above your eyes. In healthy paranasal sinuses, mucus is able to drain out, and air is  able to circulate through them by way of your nose. However, when your paranasal sinuses are inflamed, mucus and air can become trapped. This can allow bacteria and other germs to grow and cause infection. Sinusitis can develop quickly and last only a short time (acute) or continue over a long period (chronic). Sinusitis that lasts for more than 12 weeks is considered chronic. CAUSES Causes of sinusitis include:  Allergies.  Structural abnormalities, such as displacement of the cartilage that separates your nostrils (deviated septum), which can decrease the air flow through your nose and sinuses and affect sinus drainage.  Functional abnormalities, such as when the small hairs (cilia) that line your sinuses and help remove mucus do not work properly or are not present. SIGNS AND SYMPTOMS Symptoms of acute and chronic sinusitis are the same. The primary symptoms are pain and pressure around the affected sinuses. Other symptoms include:  Upper toothache.  Earache.  Headache.  Bad breath.  Decreased sense of smell and taste.  A cough, which worsens when you are lying flat.  Fatigue.  Fever.  Thick drainage from your nose, which often is green and may contain pus (purulent).  Swelling and warmth over the affected sinuses. DIAGNOSIS Your health care provider will perform a physical exam. During your exam, your health care provider may perform any of the following to help determine if you have acute sinusitis or chronic sinusitis:  Look in your nose for signs of abnormal growths in your nostrils (nasal polyps).  Tap over the affected sinus to check for signs of infection.  View the inside of your sinuses using an imaging device that has a light attached (endoscope). If your health care provider suspects that you have chronic sinusitis, one or more of the following tests may be recommended:  Allergy tests.  Nasal culture. A sample of mucus is taken from your nose, sent to a lab,  and screened for bacteria.  Nasal cytology. A sample of mucus is taken from your nose and examined by your health care provider to determine if your sinusitis is related to an allergy. TREATMENT Most cases of acute sinusitis are related to a viral infection and will resolve on their own within 10 days. Sometimes, medicines are prescribed to help relieve symptoms of both acute and chronic sinusitis. These may include pain medicines, decongestants, nasal steroid sprays, or saline sprays. However, for sinusitis related to a bacterial infection, your health care provider will prescribe antibiotic medicines. These are medicines that will help kill the bacteria causing the infection. Rarely, sinusitis is caused by a fungal infection. In these cases, your health care provider will prescribe antifungal medicine. For some cases of chronic sinusitis, surgery is needed. Generally, these are cases in which sinusitis recurs more than 3 times per year, despite other treatments. HOME CARE INSTRUCTIONS  Drink plenty of water. Water helps thin the mucus so your sinuses can drain more easily.  Use a humidifier.  Inhale steam 3-4 times a day (for example, sit in the bathroom with the shower running).  Apply a warm, moist washcloth to your face 3-4 times a day, or as directed by your health care provider.  Use saline nasal sprays to help moisten and clean your sinuses.  Take medicines only as directed by your health care provider.  If you were prescribed either an antibiotic or antifungal medicine, finish it all even if you  start to feel better. SEEK IMMEDIATE MEDICAL CARE IF:  You have increasing pain or severe headaches.  You have nausea, vomiting, or drowsiness.  You have swelling around your face.  You have vision problems.  You have a stiff neck.  You have difficulty breathing.   This information is not intended to replace advice given to you by your health care provider. Make sure you discuss  any questions you have with your health care provider.   Document Released: 04/21/2005 Document Revised: 05/12/2014 Document Reviewed: 05/06/2011 Elsevier Interactive Patient Education [employer].

## 2015-04-03 ENCOUNTER — Other Ambulatory Visit: Payer: Self-pay | Admitting: *Deleted

## 2015-04-03 DIAGNOSIS — J309 Allergic rhinitis, unspecified: Secondary | ICD-10-CM

## 2015-04-03 MED ORDER — MONTELUKAST SODIUM 10 MG PO TABS: 10.0000 mg | ORAL_TABLET | Freq: Every day | ORAL | Status: DC

## 2015-04-03 NOTE — Telephone Encounter (Signed)
RF request for montelukast LOV: 03/23/15  Next ov: None Last written: 07/26/14 #30 w/ 6RF

## 2015-04-20 ENCOUNTER — Encounter: Payer: Self-pay | Admitting: Family Medicine

## 2015-04-20 ENCOUNTER — Ambulatory Visit (INDEPENDENT_AMBULATORY_CARE_PROVIDER_SITE_OTHER): Payer: BLUE CROSS/BLUE SHIELD | Admitting: Family Medicine

## 2015-04-20 VITALS — BP 128/83 | HR 83 | Temp 98.7°F | Resp 20 | Wt 112.0 lb

## 2015-04-20 DIAGNOSIS — R319 Hematuria, unspecified: Secondary | ICD-10-CM

## 2015-04-20 DIAGNOSIS — R350 Frequency of micturition: Secondary | ICD-10-CM | POA: Diagnosis not present

## 2015-04-20 DIAGNOSIS — R8290 Unspecified abnormal findings in urine: Secondary | ICD-10-CM | POA: Diagnosis not present

## 2015-04-20 DIAGNOSIS — N390 Urinary tract infection, site not specified: Secondary | ICD-10-CM

## 2015-04-20 LAB — POCT URINALYSIS DIPSTICK
Glucose, UA: NEGATIVE
NITRITE UA: NEGATIVE
UROBILINOGEN UA: 1
pH, UA: 6

## 2015-04-20 MED ORDER — CEFTRIAXONE SODIUM 1 G IJ SOLR
1.0000 g | Freq: Once | INTRAMUSCULAR | Status: AC
  Administered 2015-04-20: 1 g via INTRAMUSCULAR

## 2015-04-20 MED ORDER — CEFDINIR 300 MG PO CAPS: 300.0000 mg | ORAL_CAPSULE | Freq: Two times a day (BID) | ORAL | Status: DC

## 2015-04-20 NOTE — Patient Instructions (Signed)
I believe you have a urinary infection. We have given you a shot of rocephin in the office today, and prescribed a 10 day course of antibiotics.  If you are unable to tolerate the medication orally or your symptoms worsen over the weekend you will need to be seen in the ED and have labs and imaging.   Pyelonephritis, Adult Pyelonephritis is a kidney infection. The kidneys are the organs that filter a person's blood and move waste out of the bloodstream and into the urine. Urine passes from the kidneys, through the ureters, and into the bladder. There are two main types of pyelonephritis:  Infections that come on quickly without any warning (acute pyelonephritis).  Infections that last for a long period of time (chronic pyelonephritis). In most cases, the infection clears up with treatment and does not cause further problems. More severe infections or chronic infections can sometimes spread to the bloodstream or lead to other problems with the kidneys. CAUSES This condition is usually caused by:  Bacteria traveling from the bladder to the kidney through infected urine. The urine in the bladder can become infected with bacteria from:  Bladder infection (cystitis).  Inflammation of the prostate gland (prostatitis).  Sexual intercourse, in females.  Bacteria traveling from the bloodstream to the kidney. RISK FACTORS This condition is more likely to develop in:  Pregnant women.  Older people.  People who have diabetes.  People who have kidney stones or bladder stones.  People who have other abnormalities of the kidney or ureter.  People who have a catheter placed in the bladder.  People who have cancer.  People who are sexually active.  Women who use spermicides.  People who have had a prior urinary tract infection. SYMPTOMS Symptoms of this condition include:  Frequent urination.  Strong or persistent urge to urinate.  Burning or stinging when urinating.  Abdominal  pain.  Back pain.  Pain in the side or flank area.  Fever.  Chills.  Blood in the urine, or dark urine.  Nausea.  Vomiting. DIAGNOSIS This condition may be diagnosed based on:  Medical history and physical exam.  Urine tests.  Blood tests. You may also have imaging tests of the kidneys, such as an ultrasound or CT scan. TREATMENT Treatment for this condition may depend on the severity of the infection.  If the infection is mild and is found early, you may be treated with antibiotic medicines taken by mouth. You will need to drink fluids to remain hydrated.  If the infection is more severe, you may need to stay in the hospital and receive antibiotics given directly into a vein through an IV tube. You may also need to receive fluids through an IV tube if you are not able to remain hydrated. After your hospital stay, you may need to take oral antibiotics for a period of time. Other treatments may be required, depending on the cause of the infection. HOME CARE INSTRUCTIONS Medicines  Take over-the-counter and prescription medicines only as told by your health care provider.  If you were prescribed an antibiotic medicine, take it as told by your health care provider. Do not stop taking the antibiotic even if you start to feel better. General Instructions  Drink enough fluid to keep your urine clear or pale yellow.  Avoid caffeine, tea, and carbonated beverages. They tend to irritate the bladder.  Urinate often. Avoid holding in urine for long periods of time.  Urinate before and after sex.  After a bowel movement,  women should cleanse from front to back. Use each tissue only once.  Keep all follow-up visits as told by your health care provider. This is important. SEEK MEDICAL CARE IF:  Your symptoms do not get better after 2 days of treatment.  Your symptoms get worse.  You have a fever. SEEK IMMEDIATE MEDICAL CARE IF:  You are unable to take your antibiotics or  fluids.  You have shaking chills.  You vomit.  You have severe flank or back pain.  You have extreme weakness or fainting.   This information is not intended to replace advice given to you by your health care provider. Make sure you discuss any questions you have with your health care provider.   Document Released: 04/21/2005 Document Revised: 01/10/2015 Document Reviewed: 08/14/2014 Elsevier Interactive Patient Education [employer].

## 2015-04-20 NOTE — Progress Notes (Signed)
Subjective:    [name], female    DOB: 11-23-1955, 59 y.o.   MRN: [id number]  HPI  Suprapubic discomfort: Patient presents with a 2 day history of feeling "lousy "and tired. She states this is very abnormal for her. Yesterday she became very fatigued and didn't want to get out of bed. He endorses chills and sweating at that time. She states she has had a mild headache off and on. She reports her left lower abdomen is uncomfortable. She is having normal bowel movements. She is eating and drinking well. She denies any dysuria or urinary frequency. She denies any nausea vomiting diarrhea or rash. She reports she has a history of kidney infections and kidney stones in the past. Her urinary tract infections in the past have been Escherichia coli resistant to ampicillin and Cipro. Patient states she's doesn't feel as bad as her prior episodes of pyelonephritis.   Past Medical History  Diagnosis Date  . Seasonal allergic rhinitis   . Basal cell carcinoma   . History of acute bronchitis 03/24/2011  . Meniere disorder 01/23/2012  . Recurrent pyelonephritis   . Lymphadenopathy, abdominal 03/2013    Noted on abd/pelv CT done to eval slow-to-resolve pyelo  . Osteoporosis 12/2014    Dr. Nori Riis, her GYN, dx'd her   No Known Allergies   Social History   Social History  . Marital Status: Married    Spouse Name: N/A  . Number of Children: N/A  . Years of Education: N/A   Occupational History  . Not on file.   Social History Main Topics  . Smoking status: Current Every Day Smoker -- 1.00 packs/day for 29 years    Types: Cigarettes    Start date: 06/01/1978  . Smokeless tobacco: Never Used  . Alcohol Use: 2.4 oz/week    4 Glasses of wine per week  . Drug Use: No  . Sexual Activity:    Partners: Male   Other Topics Concern  . Not on file   Social History Narrative     Review of Systems Negative, with the exception of above mentioned in HPI     Objective:   Physical Exam BP 128/83 mmHg  Pulse 83  Temp(Src) 98.7 F (37.1 C) (Oral)  Resp 20  Wt 112 lb (50.803 kg)  SpO2 97% Gen: Afebrile. No acute distress. Nontoxic in appearance, well-developed, well-nourished, Caucasian female. HENT: AT. [place].  MMM.  Eyes:Pupils Equal Round Reactive to light, Extraocular movements intact,  Conjunctiva without redness, discharge or icterus. CV: RRR  Chest: CTAB, no wheeze or crackles Abd: Soft. Flat. ND. Mild left suprapubic pain to palpation and mild left abdominal pain to palpation. BS present. No Masses palpated. No rebound or guarding. MSK: Very mild left CVA tenderness Neuro: Normal gait. PERLA. EOMi. Alert. Oriented x3    Assessment & Plan:  1. Urinary frequency - POCT urinalysis dipstick --> dark urine, his ketones, moderate blood, 30 protein, trace leukocytes. - Urine Culture  2. Urinary tract infection with hematuria, site unspecified - Mild concern for pyelonephritis considering history of patient not extremely tender on exam and afebrile. Discussed with patient will treat as she has been treated prior with ceftriaxone 1 g IM in the office today followed by cefdinir 300 mg twice a day for 10 days. Red flags discussed with patient, AVS on pyelonephritis provided to patient for education. Patient was encouraged that if she has any worsening symptoms over the weekend to be seen immediately as labs  and imaging studies would be needed. - cefdinir (OMNICEF) 300 MG capsule; Take 1 capsule (300 mg total) by mouth 2 (two) times daily.  Dispense: 20 capsule; Refill: 0 - cefTRIAXone (ROCEPHIN) injection 1 g; Inject 1 g into the muscle once. - Follow-up less than a week.

## 2015-04-22 LAB — URINE CULTURE

## 2015-04-23 ENCOUNTER — Telehealth: Payer: Self-pay | Admitting: Family Medicine

## 2015-04-23 NOTE — Telephone Encounter (Signed)
Please call pt: - Her urine culture did not show much growth on culture, suggesting her discomfort was probably not from infection.  - She should be re-evaluated if she is continuing to have symptoms.  - She can continue the antibiotics, if she is improving.

## 2015-04-23 NOTE — Telephone Encounter (Signed)
LMOM for pt to CB.  

## 2015-04-23 NOTE — Telephone Encounter (Signed)
Patient states she is feeling better. She said the only thing she has noticed is that she can "feel" her kidney when she walks.

## 2015-06-07 ENCOUNTER — Ambulatory Visit (INDEPENDENT_AMBULATORY_CARE_PROVIDER_SITE_OTHER): Payer: BLUE CROSS/BLUE SHIELD | Admitting: Family Medicine

## 2015-06-07 ENCOUNTER — Encounter: Payer: Self-pay | Admitting: Family Medicine

## 2015-06-07 VITALS — BP 136/81 | HR 75 | Temp 99.1°F | Resp 20 | Wt 112.5 lb

## 2015-06-07 DIAGNOSIS — J0101 Acute recurrent maxillary sinusitis: Secondary | ICD-10-CM | POA: Diagnosis not present

## 2015-06-07 DIAGNOSIS — R053 Chronic cough: Secondary | ICD-10-CM | POA: Insufficient documentation

## 2015-06-07 DIAGNOSIS — R319 Hematuria, unspecified: Secondary | ICD-10-CM | POA: Diagnosis not present

## 2015-06-07 DIAGNOSIS — R05 Cough: Secondary | ICD-10-CM | POA: Diagnosis not present

## 2015-06-07 DIAGNOSIS — R059 Cough, unspecified: Secondary | ICD-10-CM

## 2015-06-07 HISTORY — DX: Hematuria, unspecified: R31.9

## 2015-06-07 HISTORY — DX: Chronic cough: R05.3

## 2015-06-07 LAB — URINALYSIS, ROUTINE W REFLEX MICROSCOPIC
Bilirubin Urine: NEGATIVE
LEUKOCYTES UA: NEGATIVE
NITRITE: NEGATIVE
PH: 6 (ref 5.0–8.0)
SPECIFIC GRAVITY, URINE: 1.025 (ref 1.000–1.030)
Total Protein, Urine: NEGATIVE
Urine Glucose: NEGATIVE
Urobilinogen, UA: 0.2 (ref 0.0–1.0)

## 2015-06-07 MED ORDER — DOXYCYCLINE HYCLATE 100 MG PO TABS: 100.0000 mg | ORAL_TABLET | Freq: Two times a day (BID) | ORAL | Status: DC

## 2015-06-07 MED ORDER — HYDROCODONE-HOMATROPINE 5-1.5 MG/5ML PO SYRP: 5.0000 mL | ORAL_SOLUTION | Freq: Every evening | ORAL | Status: DC | PRN

## 2015-06-07 MED ORDER — METHYLPREDNISOLONE ACETATE 80 MG/ML IJ SUSP: 80.0000 mg | Freq: Once | INTRAMUSCULAR | Status: DC

## 2015-06-07 NOTE — Progress Notes (Signed)
[name], female   DOB: [birth year], 60 y.o.   MRN: [id number]   Subjective:    [name], female    DOB: 10/26/1955, 59 y.o.   MRN: [id number]  HPI  Acute maxillary sinusitis: pt presents for acute OV for productive cough of 2 weeks. The phlegm is worse in the morning and night. Patient took a advil this morning for headache. Pt endorses chills, congestion, fatigue, rhinorrhea, headache, fever with cough. She has been taking Mucinex, Dimetapp, without great resolution. She states she is using her Advair daily, she is using her albuterol inhaler more frequently about 5 times a day 1 puff.  Patient does have COPD, and continues to smoke daily. She has many grandchildren and they have been ill.    Current Everyday Smoker  Past Medical History  Diagnosis Date  . Seasonal allergic rhinitis   . Basal cell carcinoma   . History of acute bronchitis 03/24/2011  . Meniere disorder 01/23/2012  . Recurrent pyelonephritis   . Lymphadenopathy, abdominal 03/2013    Noted on abd/pelv CT done to eval slow-to-resolve pyelo  . Osteoporosis 12/2014    Dr. Nori Riis, her GYN, dx'd her   No Known Allergies  Review of Systems Negative, with the exception of above mentioned in HPI     Objective:   Physical Exam .BP 136/81 mmHg  Pulse 75  Temp(Src) 99.1 F (37.3 C)  Resp 20  Wt 112 lb 8 oz (51.03 kg)  SpO2 94% Gen: febrile. No acute distress. Nontoxic in appearance, well-developed, thin Caucasian female, appears older in age. HENT: AT. [place]. Bilateral TM visualized and normal in appearance. MMM. Bilateral nares with erythema, no swelling. Throat without erythema or exudates. Postnasal green drainage noted. Hoarseness present on exam. Cough present on exam. Tender to right maxillary sinus with palpation. Eyes:Pupils Equal Round Reactive to light, Extraocular movements intact,  Conjunctiva without redness, discharge or icterus. Neck/lymp/endocrine: Supple, shotty cervical  lymphadenopathy CV: RRR Chest: CTAB, normal resp effort, good air movement.  Abd: Soft. Flat. NTND. BS present. No Masses palpated.  Skin: No rashes, purpura or petechiae.     Assessment & Plan:  - Acute recurrent maxillary sinusitis - Patient with recurrent maxillary sinusitis on exam today, with obvious greenish sinus drainage posteriorly pharynx.  - Patient encouraged to continue antihistamine, nasal steroid of choice, rest, hydration, albuterol inhaler and doxycycline. - doxycycline (VIBRA-TABS) 100 MG tablet; Take 1 tablet (100 mg total) by mouth 2 (two) times daily.  Dispense: 20 tablet; Refill: 0  - Cough: Hycodan at night only.   - Smoking cessation: Discussed Smoking cessation for 5 minutes, including different possible therapies available for assistance. AVS on Bupropion was provided to patient today. She will review, and return to office to discuss in more detail if she desires assistance, when she does decide to try to quit.    Follow-up as needed

## 2015-06-07 NOTE — Patient Instructions (Signed)
Bupropion sustained-release tablets (smoking cessation) What is this medicine? BUPROPION (byoo PROE pee on) is used to help people quit smoking. This medicine may be used for other purposes; ask your health care provider or pharmacist if you have questions. What should I tell my health care provider before I take this medicine? They need to know if you have any of these conditions: -an eating disorder, such as anorexia or bulimia -bipolar disorder or psychosis -diabetes or high blood sugar, treated with medication -glaucoma -head injury or brain tumor -heart disease, previous heart attack, or irregular heart beat -high blood pressure -kidney or liver disease -seizures -suicidal thoughts or a previous suicide attempt -Tourette's syndrome -weight loss -an unusual or allergic reaction to bupropion, other medicines, foods, dyes, or preservatives -breast-feeding -pregnant or trying to become pregnant How should I use this medicine? Take this medicine by mouth with a glass of water. Follow the directions on the prescription label. You can take it with or without food. If it upsets your stomach, take it with food. Do not cut, crush or chew this medicine. Take your medicine at regular intervals. If you take this medicine more than once a day, take your second dose at least 8 hours after you take your first dose. To limit difficulty in sleeping, avoid taking this medicine at bedtime. Do not take your medicine more often than directed. Do not stop taking this medicine suddenly except upon the advice of your doctor. Stopping this medicine too quickly may cause serious side effects. A special MedGuide will be given to you by the pharmacist with each prescription and refill. Be sure to read this information carefully each time. Talk to your pediatrician regarding the use of this medicine in children. Special care may be needed. Overdosage: If you think you have taken too much of this medicine contact a  poison control center or emergency room at once. NOTE: This medicine is only for you. Do not share this medicine with others. What if I miss a dose? If you miss a dose, skip the missed dose and take your next tablet at the regular time. There should be at least 8 hours between doses. Do not take double or extra doses. What may interact with this medicine? Do not take this medicine with any of the following medications: -linezolid -MAOIs like Azilect, Carbex, Eldepryl, Marplan, Nardil, and Parnate -methylene blue (injected into a vein) -other medicines that contain bupropion like Wellbutrin This medicine may also interact with the following medications: -alcohol -certain medicines for anxiety or sleep -certain medicines for blood pressure like metoprolol, propranolol -certain medicines for depression or psychotic disturbances -certain medicines for HIV or AIDS like efavirenz, lopinavir, nelfinavir, ritonavir -certain medicines for irregular heart beat like propafenone, flecainide -certain medicines for Parkinson's disease like amantadine, levodopa -certain medicines for seizures like carbamazepine, phenytoin, phenobarbital -cimetidine -clopidogrel -cyclophosphamide -furazolidone -isoniazid -nicotine -orphenadrine -procarbazine -steroid medicines like prednisone or cortisone -stimulant medicines for attention disorders, weight loss, or to stay awake -tamoxifen -theophylline -thiotepa -ticlopidine -tramadol -warfarin This list may not describe all possible interactions. Give your health care provider a list of all the medicines, herbs, non-prescription drugs, or dietary supplements you use. Also tell them if you smoke, drink alcohol, or use illegal drugs. Some items may interact with your medicine. What should I watch for while using this medicine? Visit your doctor or health care professional for regular checks on your progress. This medicine should be used together with a patient  support program. It is important   to participate in a behavioral program, counseling, or other support program that is recommended by your health care professional. Patients and their families should watch out for new or worsening thoughts of suicide or depression. Also watch out for sudden changes in feelings such as feeling anxious, agitated, panicky, irritable, hostile, aggressive, impulsive, severely restless, overly excited and hyperactive, or not being able to sleep. If this happens, especially at the beginning of treatment or after a change in dose, call your health care professional. Avoid alcoholic drinks while taking this medicine. Drinking excessive alcoholic beverages, using sleeping or anxiety medicines, or quickly stopping the use of these agents while taking this medicine may increase your risk for a seizure. Do not drive or use heavy machinery until you know how this medicine affects you. This medicine can impair your ability to perform these tasks. Do not take this medicine close to bedtime. It may prevent you from sleeping. Your mouth may get dry. Chewing sugarless gum or sucking hard candy, and drinking plenty of water may help. Contact your doctor if the problem does not go away or is severe. Do not use nicotine patches or chewing gum without the advice of your doctor or health care professional while taking this medicine. You may need to have your blood pressure taken regularly if your doctor recommends that you use both nicotine and this medicine together. What side effects may I notice from receiving this medicine? Side effects that you should report to your doctor or health care professional as soon as possible: -allergic reactions like skin rash, itching or hives, swelling of the face, lips, or tongue -breathing problems -changes in vision -confusion -fast or irregular heartbeat -hallucinations -increased blood pressure -redness, blistering, peeling or loosening of the skin,  including inside the mouth -seizures -suicidal thoughts or other mood changes -unusually weak or tired -vomiting Side effects that usually do not require medical attention (report to your doctor or health care professional if they continue or are bothersome): -change in sex drive or performance -constipation -headache -loss of appetite -nausea -tremors -weight loss This list may not describe all possible side effects. Call your doctor for medical advice about side effects. You may report side effects to FDA at 1-800-FDA-1088. Where should I keep my medicine? Keep out of the reach of children. Store at room temperature between 20 and 25 degrees C (68 and 77 degrees F). Protect from light. Keep container tightly closed. Throw away any unused medicine after the expiration date. NOTE: This sheet is a summary. It may not cover all possible information. If you have questions about this medicine, talk to your doctor, pharmacist, or health care provider.    2016, Elsevier/Gold Standard. (2012-12-17 10:55:10)  

## 2015-06-08 ENCOUNTER — Telehealth: Payer: Self-pay | Admitting: Family Medicine

## 2015-06-08 DIAGNOSIS — Z720 Tobacco use: Secondary | ICD-10-CM

## 2015-06-08 DIAGNOSIS — R319 Hematuria, unspecified: Secondary | ICD-10-CM

## 2015-06-08 NOTE — Telephone Encounter (Signed)
Please call pt: - Her urine resulted with a fair amount of blood still remaining in her urine. I have placed a urology referral for considering her consistent hematuria in a smoker.

## 2015-06-11 NOTE — Telephone Encounter (Signed)
LMOM for pt to CB.  

## 2015-06-11 NOTE — Telephone Encounter (Signed)
Patient aware of results and referral 

## 2015-08-02 ENCOUNTER — Other Ambulatory Visit: Payer: Self-pay | Admitting: *Deleted

## 2015-08-02 DIAGNOSIS — J449 Chronic obstructive pulmonary disease, unspecified: Secondary | ICD-10-CM

## 2015-08-02 MED ORDER — FLUTICASONE-SALMETEROL 230-21 MCG/ACT IN AERO
2.0000 | INHALATION_SPRAY | Freq: Two times a day (BID) | RESPIRATORY_TRACT | Status: DC
Start: 2015-08-02 — End: 2016-08-11

## 2015-08-02 NOTE — Telephone Encounter (Signed)
Advair refilled per refill protocol.

## 2015-08-11 ENCOUNTER — Telehealth: Payer: BLUE CROSS/BLUE SHIELD | Admitting: Physician Assistant

## 2015-08-11 DIAGNOSIS — R399 Unspecified symptoms and signs involving the genitourinary system: Secondary | ICD-10-CM

## 2015-08-11 MED ORDER — CIPROFLOXACIN HCL 500 MG PO TABS
500.0000 mg | ORAL_TABLET | Freq: Two times a day (BID) | ORAL | Status: DC
Start: 2015-08-11 — End: 2015-10-24

## 2015-08-11 NOTE — Progress Notes (Signed)

## 2015-08-22 ENCOUNTER — Other Ambulatory Visit: Payer: Self-pay | Admitting: *Deleted

## 2015-08-22 DIAGNOSIS — J309 Allergic rhinitis, unspecified: Secondary | ICD-10-CM

## 2015-08-22 MED ORDER — AZELASTINE HCL 0.1 % NA SOLN: 1.0000 | Freq: Two times a day (BID) | NASAL | Status: DC

## 2015-08-22 MED ORDER — ALBUTEROL SULFATE HFA 108 (90 BASE) MCG/ACT IN AERS: INHALATION_SPRAY | RESPIRATORY_TRACT | Status: DC

## 2015-08-22 NOTE — Telephone Encounter (Signed)
Proair and astelin refilled per refill protocol

## 2015-08-27 ENCOUNTER — Telehealth: Payer: BLUE CROSS/BLUE SHIELD | Admitting: Family

## 2015-08-27 DIAGNOSIS — J069 Acute upper respiratory infection, unspecified: Secondary | ICD-10-CM

## 2015-08-27 MED ORDER — BENZONATATE 100 MG PO CAPS: 100.0000 mg | ORAL_CAPSULE | Freq: Three times a day (TID) | ORAL | Status: DC | PRN

## 2015-08-27 MED ORDER — AZITHROMYCIN 250 MG PO TABS: ORAL_TABLET | ORAL | Status: DC

## 2015-08-27 NOTE — Progress Notes (Signed)

## 2015-09-21 DIAGNOSIS — Z0000 Encounter for general adult medical examination without abnormal findings: Secondary | ICD-10-CM | POA: Diagnosis not present

## 2015-09-21 DIAGNOSIS — R311 Benign essential microscopic hematuria: Secondary | ICD-10-CM | POA: Diagnosis not present

## 2015-09-21 DIAGNOSIS — N362 Urethral caruncle: Secondary | ICD-10-CM | POA: Diagnosis not present

## 2015-09-28 ENCOUNTER — Ambulatory Visit: Payer: BLUE CROSS/BLUE SHIELD | Admitting: Family Medicine

## 2015-09-28 DIAGNOSIS — S8012XA Contusion of left lower leg, initial encounter: Secondary | ICD-10-CM | POA: Diagnosis not present

## 2015-10-03 ENCOUNTER — Ambulatory Visit (INDEPENDENT_AMBULATORY_CARE_PROVIDER_SITE_OTHER): Payer: BLUE CROSS/BLUE SHIELD | Admitting: Family Medicine

## 2015-10-03 ENCOUNTER — Other Ambulatory Visit: Payer: Self-pay

## 2015-10-03 VITALS — BP 132/80 | HR 62 | Wt 111.0 lb

## 2015-10-03 DIAGNOSIS — S83289A Other tear of lateral meniscus, current injury, unspecified knee, initial encounter: Secondary | ICD-10-CM | POA: Insufficient documentation

## 2015-10-03 DIAGNOSIS — M25562 Pain in left knee: Secondary | ICD-10-CM

## 2015-10-03 DIAGNOSIS — S82402A Unspecified fracture of shaft of left fibula, initial encounter for closed fracture: Secondary | ICD-10-CM | POA: Diagnosis not present

## 2015-10-03 DIAGNOSIS — S83282A Other tear of lateral meniscus, current injury, left knee, initial encounter: Secondary | ICD-10-CM | POA: Diagnosis not present

## 2015-10-03 MED ORDER — VITAMIN D (ERGOCALCIFEROL) 1.25 MG (50000 UNIT) PO CAPS: 50000.0000 [IU] | ORAL_CAPSULE | ORAL | Status: DC

## 2015-10-03 NOTE — Patient Instructions (Signed)
Good to see you  Ice 20 minutes 2 times daily. Usually after activity and before bed. Exercises 3 times a week but do not start until Monday to allow the bone to heal Once weekly vitamin D for the next 4 weeks.  pennsaid pinkie amount topically 2 times daily as needed.  No twisting or deep bending if you can help it.  See me again in 3 weeks to make sure the bone is healed appropriately.

## 2015-10-03 NOTE — Progress Notes (Signed)
Pre visit review using our clinic review tool, if applicable. No additional management support is needed unless otherwise documented below in the visit note. 

## 2015-10-03 NOTE — Progress Notes (Signed)
[name] [address] [place], [place] [address] Phone: [phone] Subjective:    I'm seeing this patient by the request  of:    CC: Left knee pain  [id number] [name] is a 60 y.o. female coming in with complaint of left knee pain. 5 days ago patient was doing some gardening and a wheelbarrow rotated the lateral aspect of her left knee. Give her significant amount of pain in as well as some swelling but no bruising immediately. Was difficult to ambulate. Went to urgent care orthopedic facility. Had x-rays done at that time is concern for potential fracture but also a meniscal injury. Patient was given an injection. States that this is help somewhat. Still ambulating carefully and if she makes a twisting motion can have a severe pain. No radiation down the leg any numbness or weakness of the leg. Rates the severity of pain initially as 9 out of 10 but now closer to 4 out of 10.     Past Medical History  Diagnosis Date  . Seasonal allergic rhinitis   . Basal cell carcinoma   . History of acute bronchitis 03/24/2011  . Meniere disorder 01/23/2012  . Recurrent pyelonephritis   . Lymphadenopathy, abdominal 03/2013    Noted on abd/pelv CT done to eval slow-to-resolve pyelo  . Osteoporosis 12/2014    Dr. Nori Riis, her GYN, dx'd her   Past Surgical History  Procedure Laterality Date  . Biopsy on skin cancer    . Dilation and curettage of uterus  2009  . Dexa  12/2014    T score spine -2.4, hip -3.5 (Dr. Nori Riis, [facility].   Social History   Social History  . Marital Status: Married    Spouse Name: N/A  . Number of Children: N/A  . Years of Education: N/A   Social History Main Topics  . Smoking status: Current Every Day Smoker -- 1.00 packs/day for 29 years    Types: Cigarettes    Start date: 06/01/1978  . Smokeless tobacco: Never Used  . Alcohol Use: 2.4 oz/week    4 Glasses of wine per week  . Drug Use: No  . Sexual  Activity:    Partners: Male   Other Topics Concern  . Not on file   Social History Narrative   No Known Allergies Family History  Problem Relation Age of Onset  . Hypertension Mother   . Glaucoma Mother   . Irritable bowel syndrome Mother   . Arthritis Mother   . Kidney disease Mother   . Diabetes Mother   . Hypertension Father   . Heart disease Father     bypasses  . Cancer Father     sarcoma  . Cancer Maternal Grandmother     breast  . Heart disease Maternal Grandmother     CHF  . Stroke Maternal Grandmother     in his 31's  . Stroke Maternal Grandfather   . Other Paternal Grandmother     hardening of the arteries  . Heart attack Paternal Grandfather   . Colon cancer Paternal Aunt 8  . Stomach cancer Neg Hx     Past medical history, social, surgical and family history all reviewed in electronic medical record.  No pertanent information unless stated regarding to the chief complaint.   Review of Systems: No headache, visual changes, nausea, vomiting, diarrhea, constipation, dizziness, abdominal pain, skin rash, fevers, chills, night sweats, weight loss, swollen lymph nodes, body aches, joint  swelling, muscle aches, chest pain, shortness of breath, mood changes.   Objective Blood pressure 132/80, pulse 62, weight 111 lb (50.349 kg), SpO2 93 %.  General: No apparent distress alert and oriented x3 mood and affect normal, dressed appropriately.  HEENT: Pupils equal, extraocular movements intact  Respiratory: Patient's speak in full sentences and does not appear short of breath  Cardiovascular: No lower extremity edema, non tender, no erythema  Skin: Warm dry intact with no signs of infection or rash on extremities or on axial skeleton.  Abdomen: Soft nontender  Neuro: Cranial nerves II through XII are intact, neurovascularly intact in all extremities with 2+ DTRs and 2+ pulses.  Lymph: No lymphadenopathy of posterior or anterior cervical chain or axillae bilaterally.    Gait normal with good balance and coordination.  MSK:  Non tender with full range of motion and good stability and symmetric strength and tone of shoulders, elbows, wrist, hip, and ankles bilaterally.  Knee: Left  Normal to inspection with no erythema or effusion or obvious bony abnormalities. Tender over the lateral joint line as well as the fibular head ROM full in flexion and extension and lower leg rotation. Ligaments with solid consistent endpoints including ACL, PCL, LCL, MCL. Positive Mcmurray's, Apley's, and Thessalonian tests. Non painful patellar compression. Patellar glide without crepitus. Patellar and quadriceps tendons unremarkable. Hamstring and quadriceps strength is normal.  Contralateral knee unremarkable  MSK US performed of: Left knee This study was ordered, performed, and interpreted by Charlann Boxer D.O.  Knee: All structures visualized. Anterior lateral meniscus does have a tear noted with increasing Doppler flow and some mild hypoechoic changes. No significant displacement. Patient's also proximal fibular head and does have a cortical defect noted. Increasing Doppler flow in the area as well. No significant displacement. Patellar Tendon unremarkable on long and transverse views without effusion. No abnormality of prepatellar bursa. LCL and MCL unremarkable on long and transverse views. No abnormality of origin of medial or lateral head of the gastrocnemius.  IMPRESSION:  Lateral meniscal tear with no displacement as well as likely proximal fibular fracture.   procedure note [id number]; 15 minutes spent for Therapeutic exercises as stated in above notes.  This included exercises focusing on stretching, strengthening, with significant focus on eccentric aspects.  Flexion and extension exercises with vastus medialis oblique strengthening as well as hip abductor strengthening. Hamstring stretches exercises given as well. Proper technique shown and discussed handout in great  detail with ATC.  All questions were discussed and answered.   Impression and Recommendations:     This case required medical decision making of moderate complexity.      Note: This dictation was prepared with Dragon dictation along with smaller phrase technology. Any transcriptional errors that result from this process are unintentional.

## 2015-10-03 NOTE — Assessment & Plan Note (Signed)
Does have a lateral meniscal injury. Has radiated had an injection from an outside facility. We discussed icing regimen and patient work with Product/process development scientist to learn home exercises. Not doing any bracing. Patient will avoid any twisting motions. Patient will continue to be monitored closely and follow-up in 3 weeks.

## 2015-10-03 NOTE — Assessment & Plan Note (Signed)
Patient does have what isn't pierced to be a cortical defect over the proximal fibular head. Likely will heal on its own. There is some close proximity to the sural nerve and we will monitor. Discussed with patient if any weakness occurs to seek medical attention immediately. Patient will be put on once weekly vitamin D for the next 4 weeks. I would like her to follow-up in 3 weeks we will ultrasound again and make sure that there is good callus formation.

## 2015-10-24 ENCOUNTER — Encounter: Payer: Self-pay | Admitting: Family Medicine

## 2015-10-24 ENCOUNTER — Ambulatory Visit (INDEPENDENT_AMBULATORY_CARE_PROVIDER_SITE_OTHER): Payer: BLUE CROSS/BLUE SHIELD | Admitting: Family Medicine

## 2015-10-24 ENCOUNTER — Other Ambulatory Visit: Payer: Self-pay

## 2015-10-24 VITALS — BP 122/80 | HR 62 | Wt 111.0 lb

## 2015-10-24 DIAGNOSIS — M79605 Pain in left leg: Secondary | ICD-10-CM

## 2015-10-24 DIAGNOSIS — S82402D Unspecified fracture of shaft of left fibula, subsequent encounter for closed fracture with routine healing: Secondary | ICD-10-CM

## 2015-10-24 DIAGNOSIS — S83282D Other tear of lateral meniscus, current injury, left knee, subsequent encounter: Secondary | ICD-10-CM

## 2015-10-24 NOTE — Patient Instructions (Addendum)
Good to see you  Bone and meniscus looks good.  Tylenol 325 mg 3 times daily  Vitamin D 2000 IU daily  Turmeric 500mg  daily  Tart cherry extract any dose at night Fish oil 2 grams daily  If thumbs act up see me again

## 2015-10-24 NOTE — Assessment & Plan Note (Signed)
Healing at this time. Likely 3 weeks until patient is completely pain-free. Do not think doing any other significant changes in management at this time. Patient will continue to increase activity as tolerated.

## 2015-10-24 NOTE — Assessment & Plan Note (Signed)
Patient is doing very well at this time. Denies any numbness. Patient denies any locking. Encourage her to continue home exercises fairly regularly. Forcing symptoms to seek medical attention otherwise patient can follow-up as needed. Do not feel that she will need formal physical therapy.

## 2015-10-24 NOTE — Progress Notes (Signed)
[name], [place] [address] Phone: [phone] Subjective:      CC: Left knee pain f/u [id number] [name] is a 60 y.o. female coming in with complaint of left knee pain. Patient was found to have a lateral meniscal tear as well as a proximal fibular fracture. Patient was to do home exercises, icing, and we discussed this. Patient is doing much better overall. States that she is having very minimal pain on the lateral aspect of the knee. States that there is no catching, giving out, any radiation down the leg. Overall very happy with the results.     Past Medical History  Diagnosis Date  . Seasonal allergic rhinitis   . Basal cell carcinoma   . History of acute bronchitis 03/24/2011  . Meniere disorder 01/23/2012  . Recurrent pyelonephritis   . Lymphadenopathy, abdominal 03/2013    Noted on abd/pelv CT done to eval slow-to-resolve pyelo  . Osteoporosis 12/2014    Dr. Nori Riis, her GYN, dx'd her   Past Surgical History  Procedure Laterality Date  . Biopsy on skin cancer    . Dilation and curettage of uterus  2009  . Dexa  12/2014    T score spine -2.4, hip -3.5 (Dr. Nori Riis, [facility].   Social History   Social History  . Marital Status: Married    Spouse Name: N/A  . Number of Children: N/A  . Years of Education: N/A   Social History Main Topics  . Smoking status: Current Every Day Smoker -- 1.00 packs/day for 29 years    Types: Cigarettes    Start date: 06/01/1978  . Smokeless tobacco: Never Used  . Alcohol Use: 2.4 oz/week    4 Glasses of wine per week  . Drug Use: No  . Sexual Activity:    Partners: Male   Other Topics Concern  . Not on file   Social History Narrative   No Known Allergies Family History  Problem Relation Age of Onset  . Hypertension Mother   . Glaucoma Mother   . Irritable bowel syndrome Mother   . Arthritis Mother   . Kidney disease Mother   . Diabetes  Mother   . Hypertension Father   . Heart disease Father     bypasses  . Cancer Father     sarcoma  . Cancer Maternal Grandmother     breast  . Heart disease Maternal Grandmother     CHF  . Stroke Maternal Grandmother     in his 69's  . Stroke Maternal Grandfather   . Other Paternal Grandmother     hardening of the arteries  . Heart attack Paternal Grandfather   . Colon cancer Paternal Aunt 70  . Stomach cancer Neg Hx     Past medical history, social, surgical and family history all reviewed in electronic medical record.  No pertanent information unless stated regarding to the chief complaint.   Review of Systems: No headache, visual changes, nausea, vomiting, diarrhea, constipation, dizziness, abdominal pain, skin rash, fevers, chills, night sweats, weight loss, swollen lymph nodes, body aches, joint swelling, muscle aches, chest pain, shortness of breath, mood changes.   Objective Blood pressure 122/80, pulse 62, weight 111 lb (50.349 kg), SpO2 97 %.  General: No apparent distress alert and oriented x3 mood and affect normal, dressed appropriately.  HEENT: Pupils equal, extraocular movements intact  Respiratory: Patient's speak in full sentences and does not appear short of  breath  Cardiovascular: No lower extremity edema, non tender, no erythema  Skin: Warm dry intact with no signs of infection or rash on extremities or on axial skeleton.  Abdomen: Soft nontender  Neuro: Cranial nerves II through XII are intact, neurovascularly intact in all extremities with 2+ DTRs and 2+ pulses.  Lymph: No lymphadenopathy of posterior or anterior cervical chain or axillae bilaterally.  Gait normal with good balance and coordination.  MSK:  Non tender with full range of motion and good stability and symmetric strength and tone of shoulders, elbows, wrist, hip, and ankles bilaterally.  Knee: Left  Normal to inspection with no erythema or effusion or obvious bony abnormalities. Minimal  tenderness still over the fibular head ROM full in flexion and extension and lower leg rotation. Ligaments with solid consistent endpoints including ACL, PCL, LCL, MCL. Negative Mcmurray's, Apley's, and Thessalonian tests. Non painful patellar compression. Patellar glide without crepitus. Patellar and quadriceps tendons unremarkable. Hamstring and quadriceps strength is normal.  Contralateral knee unremarkable  MSK US performed of: Left knee This study was ordered, performed, and interpreted by Charlann Boxer D.O.  Knee: All structures visualized. Anterior lateral meniscus does have a tear noted but no longer any increasing Doppler flow as well as no hypoechoic changes. Likely healing. No displacement Patient's proximal fibular does have good callus formation. No disbursement. No abnormality of prepatellar bursa. LCL and MCL unremarkable on long and transverse views. No abnormality of origin of medial or lateral head of the gastrocnemius.  IMPRESSION: Healed proximal fracture of the fibula with decreased hypoechoic changes around the lateral meniscus     Impression and Recommendations:     This case required medical decision making of moderate complexity.      Note: This dictation was prepared with Dragon dictation along with smaller phrase technology. Any transcriptional errors that result from this process are unintentional.

## 2015-10-24 NOTE — Progress Notes (Signed)
Pre visit review using our clinic review tool, if applicable. No additional management support is needed unless otherwise documented below in the visit note. 

## 2016-01-04 ENCOUNTER — Telehealth: Payer: BLUE CROSS/BLUE SHIELD | Admitting: Family

## 2016-01-04 DIAGNOSIS — J209 Acute bronchitis, unspecified: Secondary | ICD-10-CM

## 2016-01-04 MED ORDER — AZITHROMYCIN 250 MG PO TABS: ORAL_TABLET | ORAL | 0 refills | Status: DC

## 2016-01-04 MED ORDER — BENZONATATE 100 MG PO CAPS: 100.0000 mg | ORAL_CAPSULE | Freq: Two times a day (BID) | ORAL | 0 refills | Status: DC | PRN

## 2016-01-04 NOTE — Progress Notes (Signed)

## 2016-01-14 ENCOUNTER — Encounter: Payer: Self-pay | Admitting: Family Medicine

## 2016-01-14 ENCOUNTER — Ambulatory Visit (INDEPENDENT_AMBULATORY_CARE_PROVIDER_SITE_OTHER): Payer: BLUE CROSS/BLUE SHIELD | Admitting: Family Medicine

## 2016-01-14 VITALS — BP 130/92 | HR 127 | Temp 101.2°F | Resp 20 | Wt 110.8 lb

## 2016-01-14 DIAGNOSIS — J189 Pneumonia, unspecified organism: Secondary | ICD-10-CM | POA: Diagnosis not present

## 2016-01-14 DIAGNOSIS — J069 Acute upper respiratory infection, unspecified: Secondary | ICD-10-CM

## 2016-01-14 DIAGNOSIS — J209 Acute bronchitis, unspecified: Secondary | ICD-10-CM | POA: Diagnosis not present

## 2016-01-14 MED ORDER — CEFDINIR 300 MG PO CAPS: 300.0000 mg | ORAL_CAPSULE | Freq: Two times a day (BID) | ORAL | 0 refills | Status: DC

## 2016-01-14 MED ORDER — HYDROCODONE-HOMATROPINE 5-1.5 MG/5ML PO SYRP: 5.0000 mL | ORAL_SOLUTION | Freq: Three times a day (TID) | ORAL | 0 refills | Status: DC | PRN

## 2016-01-14 MED ORDER — PREDNISONE 20 MG PO TABS: ORAL_TABLET | ORAL | 0 refills | Status: DC

## 2016-01-14 NOTE — Progress Notes (Signed)
OFFICE VISIT  01/14/2016   CC:  Chief Complaint  Patient presents with  . Cough    Sinus pressure, head congestion, fever.  [name] URI   HPI:    Patient is a 60 y.o. Caucasian female who presents for respiratory complaints. Sick at least a couple weeks now.  Resp sx's waxed and waned, worsened 10+ days ago so she had an E-visit and was dx'd with bronchitis and rx'd z-pack, tessalon.  She felt improved for a couple days, then sx's returned and she has gone downhill from there.  Onset of fever yesterday.  Lots of mucous from nose, PND, and coughing lately.  Intermittent SOB, albuterol helps some.  Also using advair. Has vomited once ---post-tussive gag.  No nausea or diarrhea.   Hasn't been able to smoke.   Past Medical History:  Diagnosis Date  . Basal cell carcinoma   . History of acute bronchitis 03/24/2011  . Lymphadenopathy, abdominal 03/2013   Noted on abd/pelv CT done to eval slow-to-resolve pyelo  . Meniere disorder 01/23/2012  . Osteoporosis 12/2014   Dr. Nori Riis, her GYN, dx'd her  . Recurrent pyelonephritis   . Seasonal allergic rhinitis   COPD  Past Surgical History:  Procedure Laterality Date  . biopsy on skin cancer    . DEXA  12/2014   T score spine -2.4, hip -3.5 (Dr. Nori Riis, [facility].  [name] DILATION AND CURETTAGE OF UTERUS  2009    Outpatient Medications Prior to Visit  Medication Sig Dispense Refill  . albuterol (VENTOLIN HFA) 108 (90 Base) MCG/ACT inhaler INHALE 2 PUFFS INTO THE LUNGS EVERY 6 (SIX) HOURS AS NEEDED FOR WHEEZING. 6.7 each 3  . azelastine (ASTELIN) 0.1 % nasal spray Place 1 spray into both nostrils 2 (two) times daily. Use in each nostril as directed 30 mL 12  . benzonatate (TESSALON) 100 MG capsule Take 1 capsule (100 mg total) by mouth 2 (two) times daily as needed for cough. 20 capsule 0  . fluticasone-salmeterol (ADVAIR HFA) 230-21 MCG/ACT inhaler Inhale 2 puffs into the lungs 2 (two) times daily. 1 Inhaler 12  . montelukast  (SINGULAIR) 10 MG tablet Take 1 tablet (10 mg total) by mouth at bedtime. 30 tablet 11  . Multiple Vitamin (MULTIVITAMIN) tablet Take 1 tablet by mouth daily.    [name] azithromycin (ZITHROMAX) 250 MG tablet Take 500 mg once, then 250 mg for four days 6 tablet 0   No facility-administered medications prior to visit.     No Known Allergies  ROS As per HPI  PE: Blood pressure (!) 130/92, pulse (!) 127, temperature (!) 101.2 F (38.4 C), temperature source Oral, resp. rate 20, weight 110 lb 12.8 oz (50.3 kg), SpO2 92 %. VS: noted--normal. Gen: alert, NAD, NONTOXIC APPEARING. HEENT: eyes without injection, drainage, or swelling.  Ears: EACs clear, TMs with normal light reflex and landmarks.  Nose: Clear rhinorrhea, with some dried, crusty exudate adherent to mildly injected mucosa.  No purulent d/c.  No paranasal sinus TTP.  No facial swelling.  Throat and mouth without focal lesion.  No pharyngial swelling, erythema, or exudate.   Neck: supple, no LAD.   LUNGS: CTA bilat, nonlabored resps.  Occ insp rhonchi that clear with cough. CV: RRR, no m/r/g. EXT: no c/c/e SKIN: no rash   LABS:  none  IMPRESSION AND PLAN:  Febrile respiratory illness. Suspect acute bacterial bronchitis vs CAP. She is s/p Z pack already. Will rx cefdinir 300 mg bid x 10d. Also prednisone 40mg   qd x 5d, then 20mg  qd x 5d, then 10mg  qd x 6d. Hycodan syrup 1-2 tsp qhs prn, #140ml. Get otc generic robitussin DM OR Mucinex DM and use as directed on the packaging for cough and congestion. Use otc generic saline nasal spray 2-3 times per day to irrigate/moisturize your nasal passages.  An After Visit Summary was printed and given to the patient.  FOLLOW UP: Return if symptoms worsen or fail to improve.  Signed:  Crissie Sickles, MD           01/14/2016

## 2016-01-14 NOTE — Patient Instructions (Signed)
Get otc generic robitussin DM OR Mucinex DM and use as directed on the packaging for cough and congestion. Use otc generic saline nasal spray 2-3 times per day to irrigate/moisturize your nasal passages.   

## 2016-01-21 ENCOUNTER — Telehealth: Payer: Self-pay | Admitting: Family Medicine

## 2016-01-21 ENCOUNTER — Ambulatory Visit (HOSPITAL_BASED_OUTPATIENT_CLINIC_OR_DEPARTMENT_OTHER)
Admission: RE | Admit: 2016-01-21 | Discharge: 2016-01-21 | Disposition: A | Discharge status: Home / Self Care | Payer: BLUE CROSS/BLUE SHIELD | Source: Ambulatory Visit | Attending: Family Medicine | Admitting: Family Medicine

## 2016-01-21 ENCOUNTER — Other Ambulatory Visit: Payer: Self-pay | Admitting: Family Medicine

## 2016-01-21 DIAGNOSIS — J0100 Acute maxillary sinusitis, unspecified: Secondary | ICD-10-CM

## 2016-01-21 DIAGNOSIS — R05 Cough: Secondary | ICD-10-CM | POA: Diagnosis not present

## 2016-01-21 DIAGNOSIS — R220 Localized swelling, mass and lump, head: Secondary | ICD-10-CM | POA: Diagnosis not present

## 2016-01-21 DIAGNOSIS — R059 Cough, unspecified: Secondary | ICD-10-CM

## 2016-01-21 DIAGNOSIS — J209 Acute bronchitis, unspecified: Secondary | ICD-10-CM

## 2016-01-21 MED ORDER — PREDNISONE 20 MG PO TABS: ORAL_TABLET | ORAL | 0 refills | Status: DC

## 2016-01-21 NOTE — Telephone Encounter (Signed)
Patient would like to go to [facility] °

## 2016-01-21 NOTE — Telephone Encounter (Signed)
OK.  X-rays ordered.

## 2016-01-21 NOTE — Telephone Encounter (Signed)
LMOM for pt to CB to discuss symptoms.

## 2016-01-21 NOTE — Telephone Encounter (Signed)
No new meds at this time. I want her to get a sinus x-ray and a chest x-ray.  Pls ask where she wants to get x-rays done and then I'll order.-thx

## 2016-01-21 NOTE — Telephone Encounter (Signed)
Patient still has a sinus infection even after taking the antibiotics for a week. Please call her.

## 2016-01-21 NOTE — Telephone Encounter (Signed)
Patient states she is still coughing ( both dry, sometimes thick sputum ), head feels full and feels feverish.   Please advise.

## 2016-01-21 NOTE — Telephone Encounter (Signed)
Pt aware.

## 2016-01-28 ENCOUNTER — Encounter: Payer: Self-pay | Admitting: Family Medicine

## 2016-01-28 NOTE — Telephone Encounter (Signed)
Yes, schedule appt-thx

## 2016-01-29 ENCOUNTER — Encounter: Payer: Self-pay | Admitting: Family Medicine

## 2016-01-29 ENCOUNTER — Ambulatory Visit (INDEPENDENT_AMBULATORY_CARE_PROVIDER_SITE_OTHER): Payer: BLUE CROSS/BLUE SHIELD | Admitting: Family Medicine

## 2016-01-29 ENCOUNTER — Telehealth: Payer: Self-pay | Admitting: Family Medicine

## 2016-01-29 VITALS — BP 143/79 | HR 86 | Temp 99.1°F | Resp 20 | Wt 111.8 lb

## 2016-01-29 DIAGNOSIS — J411 Mucopurulent chronic bronchitis: Secondary | ICD-10-CM | POA: Diagnosis not present

## 2016-01-29 DIAGNOSIS — R062 Wheezing: Secondary | ICD-10-CM

## 2016-01-29 DIAGNOSIS — Z720 Tobacco use: Secondary | ICD-10-CM

## 2016-01-29 DIAGNOSIS — Z716 Tobacco abuse counseling: Secondary | ICD-10-CM | POA: Diagnosis not present

## 2016-01-29 MED ORDER — IPRATROPIUM-ALBUTEROL 0.5-2.5 (3) MG/3ML IN SOLN
3.0000 mL | Freq: Once | RESPIRATORY_TRACT | Status: AC
  Administered 2016-01-29: 3 mL via RESPIRATORY_TRACT

## 2016-01-29 MED ORDER — DOXYCYCLINE HYCLATE 100 MG PO TABS: 100.0000 mg | ORAL_TABLET | Freq: Two times a day (BID) | ORAL | 0 refills | Status: DC

## 2016-01-29 NOTE — Telephone Encounter (Signed)
Please call pt: - I have looked and I believe zyban (smoking cessation)will be covered by her insurance.  - I want her to start the cutting back 2-5 cig each day, pick her quit date as we discussed (sometime after end of Oct).  - Follow-up in 2 weeks, and we will prescribed meds for smoking cessation and see if we can get her on a better control inhaler.

## 2016-01-29 NOTE — Progress Notes (Signed)
[name] , July 05, 1955, 60 y.o., female MRN: [id number] Patient Care Team    Relationship Specialty Notifications Start End  Kathleen Hillock, DO PCP - General Family Medicine  04/23/15   Kathleen Napoleon, MD Consulting Physician Oncology  05/12/13   Kathleen Fus, MD Consulting Physician Obstetrics and Gynecology  01/01/15     CC: chronic bronchitis Subjective: Pt presents for an acute OV with complaints of cough and increased plegm produciton of > 2 weeks duration.  Associated symptoms include burning in chest, chest tightness, thick sputum. She was originally seen by E-visit 9/1 treated with cough supressant and Z-pack. She did not see improvement and was seen in the Office with Dr. Genelle [name] and prescribed steroids and omnicef on 9/11. She thought she was getting a little better initially but then started to feel fevered, fatigued and cough again became worse. She was treated with another round of steroids. CXR showed emphysema change, without acute process. She is prescribed advair BID and abuterol, which she reports compliance with and has been using albuterol  inhaler a few times a day since illness.  Pt continues to smoke about 1 pack of cigarettes a day. She reports this is the worse her bronchitis has been and she is finally ready to quit. She has seen pulmonology in the past, but does not wish to return if not absolutely needed because it gives her anxiety. She is willing to perform peak flow in office.  She also reports compliance with allergy regimen: Singulair, OTC antihistamine and Astelin.  She finishes her prednisone taper tomorrow.    No Known Allergies Social History  Substance Use Topics  . Smoking status: Current Every Day Smoker    Packs/day: 1.00    Years: 29.00    Types: Cigarettes    Start date: 06/01/1978  . Smokeless tobacco: Never Used  . Alcohol use 2.4 oz/week    4 Glasses of wine per week   Past Medical History:  Diagnosis Date  . Basal cell carcinoma     . History of acute bronchitis 03/24/2011  . Lymphadenopathy, abdominal 03/2013   Noted on abd/pelv CT done to eval slow-to-resolve pyelo  . Meniere disorder 01/23/2012  . Osteoporosis 12/2014   Dr. Nori [name], her GYN, dx'd her  . Recurrent pyelonephritis   . Seasonal allergic rhinitis    Past Surgical History:  Procedure Laterality Date  . biopsy on skin cancer    . DEXA  12/2014   T score spine -2.4, hip -3.5 (Dr. Nori [name], [facility].  [name] DILATION AND CURETTAGE OF UTERUS  2009   Family History  Problem Relation Age of Onset  . Hypertension Mother   . Glaucoma Mother   . Irritable bowel syndrome Mother   . Arthritis Mother   . Kidney disease Mother   . Diabetes Mother   . Hypertension Father   . Heart disease Father     bypasses  . Cancer Father     sarcoma  . Cancer Maternal Grandmother     breast  . Heart disease Maternal Grandmother     CHF  . Stroke Maternal Grandmother     in his 45's  . Stroke Maternal Grandfather   . Other Paternal Grandmother     hardening of the arteries  . Heart attack Paternal Grandfather   . Colon cancer Paternal Aunt 73  . Stomach cancer Neg Hx      Medication List       Accurate  as of 01/29/16  2:59 PM. Always use your most recent med list.          albuterol 108 (90 Base) MCG/ACT inhaler Commonly known as:  VENTOLIN HFA INHALE 2 PUFFS INTO THE LUNGS EVERY 6 (SIX) HOURS AS NEEDED FOR WHEEZING.   azelastine 0.1 % nasal spray Commonly known as:  ASTELIN Place 1 spray into both nostrils 2 (two) times daily. Use in each nostril as directed   benzonatate 100 MG capsule Commonly known as:  TESSALON Take 1 capsule (100 mg total) by mouth 2 (two) times daily as needed for cough.   fluticasone-salmeterol 230-21 MCG/ACT inhaler Commonly known as:  ADVAIR HFA Inhale 2 puffs into the lungs 2 (two) times daily.   montelukast 10 MG tablet Commonly known as:  SINGULAIR Take 1 tablet (10 mg total) by mouth at bedtime.    multivitamin tablet Take 1 tablet by mouth daily.       No results found for this or any previous visit (from the past 24 hour(s)). No results found.   ROS: Negative, with the exception of above mentioned in HPI   Objective:  BP (!) 143/79 (BP Location: Left Arm, Patient Position: Sitting, Cuff Size: Normal)   Pulse 86   Temp 99.1 F (37.3 C)   Resp 20   Wt 111 lb 12.8 oz (50.7 kg)   SpO2 91%   BMI 18.60 kg/m  Body mass index is 18.6 kg/m. Gen: afebrile. No acute distress. Nontoxic in appearance, well developed, thin caucasian female.  HENT: AT. [place]. Bilateral TM visualized wnl. MMM, no oral lesions. Bilateral nares without erythema or swelling. Throat without erythema or exudates. Moderate cough on exam, hoarseness on exam.  Eyes:Pupils Equal Round Reactive to light, Extraocular movements intact,  Conjunctiva without redness, discharge or icterus. Neck/lymp/endocrine: Supple,no lymphadenopathy CV: RRR, no edema Chest: Anterior rhonchi present. Diminished lung sounds bilaterally posterior fields.  Abd: Soft. NTND. BS present.  Skin: no rashes, purpura or petechiae.  Neuro:  Normal gait. PERLA. EOMi. Alert. Oriented x3   Assessment/Plan: [name] is a 60 y.o. female present for acute OV for  Wheezing - improved with duoneb treatment - ipratropium-albuterol (DUONEB) 0.5-2.5 (3) MG/3ML nebulizer solution 3 mL; Take 3 mLs by nebulization once.  Mucopurulent chronic bronchitis ([place]) - treat with doxycyline 10 d - mucinex, Singulair and antihistamine advised.  - stop smoking.  - discussed the need for better control of COPD and may benefit from change in current therapy. She has had >4 exacerbations in a year.  - Cost is a concern for her, and she pays $$$ for Advair currently.  - pt to follow up in 2 weeks and will discuss.    Encounter for smoking cessation counseling > 8 min <73m - smoking cessation strongly encouraged. Pt is agreeable today.  - pt to pick her  quit date. Circle it on the calendar. Get her mind set that is her date.  - She is to start cutting back cigarettes 2- 5 cig a day, more if she think she can.  - f/u 2 weeks after acute illness and doxycyline. At that time may start new inhaler. And will prescribe Zyban (this appears to be on her plan). Discussed with pt she will start this medicine 2 weeks before quit date. Start patches 1 day before quit date and throw out cigs at that time.    > 25 minutes spent with patient, >50% of time spent face to face counseling patient and coordinating  care.    electronically signed by:  Howard Pouch, DO  [place]

## 2016-01-29 NOTE — Patient Instructions (Signed)
Start doxycyline today every 12 hours for ten days.  Circle date on calendar for quit date.  Cut back 5-10 cig week.

## 2016-01-30 NOTE — Telephone Encounter (Signed)
Spoke with patient reviewed information. She will call back to schedule an appt.

## 2016-02-20 ENCOUNTER — Encounter: Payer: Self-pay | Admitting: Family Medicine

## 2016-02-22 ENCOUNTER — Ambulatory Visit (HOSPITAL_BASED_OUTPATIENT_CLINIC_OR_DEPARTMENT_OTHER)
Admission: RE | Admit: 2016-02-22 | Discharge: 2016-02-22 | Disposition: A | Discharge status: Home / Self Care | Payer: BLUE CROSS/BLUE SHIELD | Source: Ambulatory Visit | Attending: Family Medicine | Admitting: Family Medicine

## 2016-02-22 ENCOUNTER — Telehealth: Payer: Self-pay | Admitting: Family Medicine

## 2016-02-22 ENCOUNTER — Ambulatory Visit (INDEPENDENT_AMBULATORY_CARE_PROVIDER_SITE_OTHER): Payer: BLUE CROSS/BLUE SHIELD | Admitting: Family Medicine

## 2016-02-22 ENCOUNTER — Encounter: Payer: Self-pay | Admitting: Family Medicine

## 2016-02-22 VITALS — BP 139/85 | HR 69 | Temp 98.4°F | Resp 20 | Wt 110.5 lb

## 2016-02-22 DIAGNOSIS — Z716 Tobacco abuse counseling: Secondary | ICD-10-CM | POA: Diagnosis not present

## 2016-02-22 DIAGNOSIS — J449 Chronic obstructive pulmonary disease, unspecified: Secondary | ICD-10-CM | POA: Insufficient documentation

## 2016-02-22 DIAGNOSIS — J42 Unspecified chronic bronchitis: Secondary | ICD-10-CM

## 2016-02-22 DIAGNOSIS — R059 Cough, unspecified: Secondary | ICD-10-CM

## 2016-02-22 DIAGNOSIS — R05 Cough: Secondary | ICD-10-CM | POA: Insufficient documentation

## 2016-02-22 DIAGNOSIS — R590 Localized enlarged lymph nodes: Secondary | ICD-10-CM

## 2016-02-22 DIAGNOSIS — F17200 Nicotine dependence, unspecified, uncomplicated: Secondary | ICD-10-CM

## 2016-02-22 DIAGNOSIS — Z720 Tobacco use: Secondary | ICD-10-CM

## 2016-02-22 DIAGNOSIS — J441 Chronic obstructive pulmonary disease with (acute) exacerbation: Secondary | ICD-10-CM

## 2016-02-22 MED ORDER — NICOTINE 7 MG/24HR TD PT24: 7.0000 mg | MEDICATED_PATCH | Freq: Every day | TRANSDERMAL | 0 refills | Status: DC

## 2016-02-22 MED ORDER — METHYLPREDNISOLONE ACETATE 80 MG/ML IJ SUSP
80.0000 mg | Freq: Once | INTRAMUSCULAR | Status: AC
  Administered 2016-02-22: 80 mg via INTRAMUSCULAR

## 2016-02-22 MED ORDER — PREDNISONE 50 MG PO TABS: 50.0000 mg | ORAL_TABLET | Freq: Every day | ORAL | 0 refills | Status: DC

## 2016-02-22 MED ORDER — BUPROPION HCL ER (SR) 150 MG PO TB12
ORAL_TABLET | ORAL | 0 refills | Status: DC
Start: 2016-02-22 — End: 2016-04-18

## 2016-02-22 NOTE — Telephone Encounter (Signed)
Spoke with patient she states she got my message but she does not want to do CT until after the holiday season due to finances. She states she will follow up as directed after starting zyban.

## 2016-02-22 NOTE — Telephone Encounter (Signed)
Please call pt: - her cxr shows just COPD.  - I have called in the prednisone, zyban and nicotine patches.  - I have also ordered a CT of her chest. This does not have to be done urgently, but would like to have completed within next few weeks since she is having elongated symptoms, smoking history and her CT in 2015 recommended a follow up to be completed and one was not done.  Also a bmp ordered to be completed a few days prior to CT to monitor kidney function, since she will have contrast - F/U 3.5 weeks after starting zyban. We will do her in house peak flow at that time and consider switch of her inhaler.

## 2016-02-22 NOTE — Patient Instructions (Signed)
Get chest xray. Start prednisone tomorrow. Quit date November 10th, CONGRATS! Start Zyban 10/27, and start patch the night prior to quit date.  Follow up in 2 weeks for pulmonary function test.  If needed will call in antibiotic after chest xray.

## 2016-02-22 NOTE — Telephone Encounter (Signed)
Noted I understand completely. I am not certain what or if she will have an out-of-pocket expense if she has reached her deductible. She may want to find out before she makes her decision.

## 2016-02-22 NOTE — Telephone Encounter (Signed)
Left detailed message with resullts and complete instructions on patient voice mail per DPR.

## 2016-02-22 NOTE — Progress Notes (Signed)
[name] , 12-30-1955, 60 y.o., female MRN: [id number] Patient Care Team    Relationship Specialty Notifications Start End  Ma Hillock, DO PCP - General Family Medicine  04/23/15   Volanda Napoleon, MD Consulting Physician Oncology  05/12/13   Maisie Fus, MD Consulting Physician Obstetrics and Gynecology  01/01/15   Chesley Mires, MD Consulting Physician Pulmonary Disease  02/22/16     CC: chronic bronchitis/current smoker Subjective: chronic bronchitis/current smoker: Patient has presented 3 times in 6 weeks for chronic cough, increased phlegm production. She states she is better after the doxycyline, however her phlegm production/cough remains. She endorses "low grade fever" sometimes in the afternoon. She continue to smoke about 15 cigarettes of ultra lights a day. She has not chosen a quit date yet, but is still interested in zyban. She is using her Singulair, OTC antihistamine, Astelin, Advair. She had used the mucinex, but felt it was not greatly helpful. She is fearful of performing PFT, she states he causes her great anxiety. She is willing to try an in office peak flow.   Prior Note:  Pt presents for an acute OV with complaints of cough and increased plegm produciton of > 2 weeks duration.  Associated symptoms include burning in chest, chest tightness, thick sputum. She was originally seen by E-visit 9/1 treated with cough supressant and Z-pack. She did not see improvement and was seen in the Office with Dr. Genelle Gather and prescribed steroids and omnicef on 9/11. She thought she was getting a little better initially but then started to feel fevered, fatigued and cough again became worse. She was treated with another round of steroids. CXR showed emphysema change, without acute process. She is prescribed advair BID and abuterol, which she reports compliance with and has been using albuterol  inhaler a few times a day since illness.  Pt continues to smoke about 1 pack of cigarettes a  day. She reports this is the worse her bronchitis has been and she is finally ready to quit. She has seen pulmonology in the past, but does not wish to return if not absolutely needed because it gives her anxiety. She is willing to perform peak flow in office.  She also reports compliance with allergy regimen: Singulair, OTC antihistamine and Astelin.   CT chest 2015:  No axillary or supraclavicular lymphadenopathy. Mildly enlarged mediastinal lymph nodes. For example, right lower paratracheal lymph node measures 12 mm short axis. Pretracheal lymph node measures 10 mm. Right hilar lymph node measures 6 mm no central pulmonary embolism. These mildly enlarged mediastinal lymph nodes have increased by several mm compared to CT of 12/08/2008 IMPRESSION: 1. Mild mediastinal lymphadenopathy has progressed minimally from CT from 2010. 2. Left periaortic lymph nodes are not pathologic by size criteria not significant changed from recent CT 03/18/2013. 3. Spleen is normal. 4. Benign hemangioma within the liver. 5. Progressive probable inflammatory or infectious process in the lower lobes and lingula. Patient may benefit from a high-resolution CTA or bronchoscopy to further evaluate. Consider pulmonology consultation.   No Known Allergies Social History  Substance Use Topics  . Smoking status: Current Every Day Smoker    Packs/day: 1.00    Years: 29.00    Types: Cigarettes    Start date: 06/01/1978  . Smokeless tobacco: Never Used  . Alcohol use 2.4 oz/week    4 Glasses of wine per week   Past Medical History:  Diagnosis Date  . Basal cell carcinoma   .  History of acute bronchitis 03/24/2011  . Lymphadenopathy, abdominal 03/2013   Noted on abd/pelv CT done to eval slow-to-resolve pyelo  . Meniere disorder 01/23/2012  . Osteoporosis 12/2014   Dr. Nori Riis, her GYN, dx'd her  . Recurrent pyelonephritis   . Seasonal allergic rhinitis    Past Surgical History:  Procedure Laterality Date    . biopsy on skin cancer    . DEXA  12/2014   T score spine -2.4, hip -3.5 (Dr. Nori Riis, [facility] for [facility]  [name] DILATION AND CURETTAGE OF UTERUS  2009   Family History  Problem Relation Age of Onset  . Hypertension Mother   . Glaucoma Mother   . Irritable bowel syndrome Mother   . Arthritis Mother   . Kidney disease Mother   . Diabetes Mother   . Hypertension Father   . Heart disease Father     bypasses  . Cancer Father     sarcoma  . Cancer Maternal Grandmother     breast  . Heart disease Maternal Grandmother     CHF  . Stroke Maternal Grandmother     in his 11's  . Stroke Maternal Grandfather   . Other Paternal Grandmother     hardening of the arteries  . Heart attack Paternal Grandfather   . Colon cancer Paternal Aunt 28  . Stomach cancer Neg Hx      Medication List       Accurate as of 02/22/16 10:31 AM. Always use your most recent med list.          albuterol 108 (90 Base) MCG/ACT inhaler Commonly known as:  VENTOLIN HFA INHALE 2 PUFFS INTO THE LUNGS EVERY 6 (SIX) HOURS AS NEEDED FOR WHEEZING.   azelastine 0.1 % nasal spray Commonly known as:  ASTELIN Place 1 spray into both nostrils 2 (two) times daily. Use in each nostril as directed   fluticasone-salmeterol 230-21 MCG/ACT inhaler Commonly known as:  ADVAIR HFA Inhale 2 puffs into the lungs 2 (two) times daily.   montelukast 10 MG tablet Commonly known as:  SINGULAIR Take 1 tablet (10 mg total) by mouth at bedtime.   multivitamin tablet Take 1 tablet by mouth daily.   predniSONE 50 MG tablet Commonly known as:  DELTASONE Take 1 tablet (50 mg total) by mouth daily with breakfast.       No results found for this or any previous visit (from the past 24 hour(s)). Dg Chest 2 View  Result Date: 02/22/2016 CLINICAL DATA:  Cough and congestion EXAM: CHEST  2 VIEW COMPARISON:  01/21/2016 FINDINGS: Cardiac shadow is stable. The lungs are again hyperaerated consistent with COPD. No focal  infiltrate or sizable effusion is noted. No bony abnormality is seen. IMPRESSION: COPD without acute abnormality. Electronically Signed   By: Inez Catalina M.D.   On: 02/22/2016 09:43     ROS: Negative, with the exception of above mentioned in HPI   Objective:  BP 139/85 (BP Location: Right Arm, Patient Position: Sitting, Cuff Size: Normal)   Pulse 69   Temp 98.4 F (36.9 C)   Resp 20   Wt 110 lb 8 oz (50.1 kg)   SpO2 96%   BMI 18.39 kg/m  Body mass index is 18.39 kg/m. Gen: afebrile. No acute distress. Nontoxic in appearance, well developed, thin caucasian female. Cough present.  HENT: AT. [place]. Bilateral TM visualized wnl. MMM, no oral lesions. Bilateral nares without erythema or swelling. Throat without erythema or exudates. Moderate cough on exam, hoarseness on  exam.  Eyes:Pupils Equal Round Reactive to light, Extraocular movements intact,  Conjunctiva without redness, discharge or icterus. Neck/lymp/endocrine: Supple,no lymphadenopathy CV: RRR, no edema Chest: CTAB, diminished lung sounds bilateral. Normal WOB.  Abd: Soft. NTND. BS present.  Skin: no rashes, purpura or petechiae.  Neuro:  Normal gait. PERLA. EOMi. Alert. Oriented x3   Dg Chest 2 View  Result Date: 02/22/2016 CLINICAL DATA:  Cough and congestion EXAM: CHEST  2 VIEW COMPARISON:  01/21/2016 FINDINGS: Cardiac shadow is stable. The lungs are again hyperaerated consistent with COPD. No focal infiltrate or sizable effusion is noted. No bony abnormality is seen. IMPRESSION: COPD without acute abnormality. Electronically Signed   By: Inez Catalina M.D.   On: 02/22/2016 09:43    Assessment/Plan: JOSANNA DENNIE is a 60 y.o. female present for acute OV for  Mucopurulent chronic bronchitis ([facility])/excerbation/Encounter for smoking cessation counseling > 8 min <6m - treated with now three abx in 6 weeks, with one prednisone burst 6 week ago. Discussed chronic bronchitis and exacerbations with her today. Patient needs to quit  smoking and have PFT completed (last PFT 2010). She is ready to quit and has chosen Nov. 10 th as her quit date today.  - cxr: if need will call in LQ. Prednisone prescribed 5 days. IM depo medrol today.  - Continue mucinex, Singulair and antihistamine.  - Needs increased coverage with inhaler.  - discussed the need for better control of COPD and may benefit from change in current therapy. She has had >5 exacerbations in a year.  - She is to start cutting back cigarettes 2- 5 cig a day, more if she think she can.  -  Start Zyban 2 weeks before quit date (10/27 start). Start patch the night prior to quit date at bedtime.  - zyban and patches prescribed. She can consider nicotine gum if desired.  CT chest 2 years ago with mild mediastinal lymphadenopathy. Considering has continue to smoke, she has not been able to fully recover from this "bronchitis", I will recommend she has a repeat CT chest W contrast. I do not see that she had follow up to 2015 scan, although recommended.  BMP prior to CT for kidney function placed.  - f/u 3.5 weeks after starting zyban  > 25 minutes spent with patient, >50% of time spent face to face counseling patient and coordinating care.    electronically signed by:  Howard Pouch, DO  [place]

## 2016-04-03 DIAGNOSIS — Z23 Encounter for immunization: Secondary | ICD-10-CM | POA: Diagnosis not present

## 2016-04-18 ENCOUNTER — Ambulatory Visit (INDEPENDENT_AMBULATORY_CARE_PROVIDER_SITE_OTHER): Payer: BLUE CROSS/BLUE SHIELD | Admitting: Family Medicine

## 2016-04-18 ENCOUNTER — Encounter: Payer: Self-pay | Admitting: Family Medicine

## 2016-04-18 VITALS — BP 131/84 | HR 97 | Temp 99.6°F | Resp 20 | Wt 108.5 lb

## 2016-04-18 DIAGNOSIS — Z720 Tobacco use: Secondary | ICD-10-CM

## 2016-04-18 DIAGNOSIS — Z01812 Encounter for preprocedural laboratory examination: Secondary | ICD-10-CM

## 2016-04-18 DIAGNOSIS — R590 Localized enlarged lymph nodes: Secondary | ICD-10-CM | POA: Diagnosis not present

## 2016-04-18 DIAGNOSIS — R350 Frequency of micturition: Secondary | ICD-10-CM | POA: Diagnosis not present

## 2016-04-18 DIAGNOSIS — R319 Hematuria, unspecified: Secondary | ICD-10-CM

## 2016-04-18 DIAGNOSIS — J411 Mucopurulent chronic bronchitis: Secondary | ICD-10-CM

## 2016-04-18 DIAGNOSIS — R8290 Unspecified abnormal findings in urine: Secondary | ICD-10-CM

## 2016-04-18 DIAGNOSIS — F17218 Nicotine dependence, cigarettes, with other nicotine-induced disorders: Secondary | ICD-10-CM

## 2016-04-18 DIAGNOSIS — N390 Urinary tract infection, site not specified: Secondary | ICD-10-CM

## 2016-04-18 DIAGNOSIS — Z716 Tobacco abuse counseling: Secondary | ICD-10-CM

## 2016-04-18 LAB — BASIC METABOLIC PANEL
BUN: 10 mg/dL (ref 6–23)
CALCIUM: 9.1 mg/dL (ref 8.4–10.5)
CO2: 31 mEq/L (ref 19–32)
Chloride: 103 mEq/L (ref 96–112)
Creatinine, Ser: 0.72 mg/dL (ref 0.40–1.20)
GFR: 87.81 mL/min (ref >60.00)
GLUCOSE: 147 mg/dL — AB (ref 70–99)
Potassium: 4.1 mEq/L (ref 3.5–5.1)
Sodium: 139 mEq/L (ref 135–145)

## 2016-04-18 LAB — POC URINALSYSI DIPSTICK (AUTOMATED)
GLUCOSE UA: NEGATIVE
KETONES UA: NEGATIVE
Nitrite, UA: NEGATIVE
SPEC GRAV UA: 1.02
Urobilinogen, UA: 1
pH, UA: 6.5

## 2016-04-18 MED ORDER — AMOXICILLIN-POT CLAVULANATE 875-125 MG PO TABS: 1.0000 | ORAL_TABLET | Freq: Two times a day (BID) | ORAL | 0 refills | Status: DC

## 2016-04-18 NOTE — Patient Instructions (Signed)
I hope you have a great Holiday.  If you want more assistance with smoking cessation please let me know, I would be happy to help.   I have called in Augmentin to cover both upper respiratory and UTI.    Please help [place] help you:  It is a privilege to be able to take care of great patients such as yourself. We are honored you have chosen [facility] for your [facility] home. Below you will find basic instructions that you may need to access in the future. Please help [place] help you by reading the instructions, which cover many of the frequent questions we experience.   Prescription refills and request:  -In order to allow more efficient response time, please call your pharmacy for all refills. They will forward the request electronically to [place]. This allows for the quickest possible response. Request left on a nurse line can take longer to refill, since these are checked as time allows between office patients and other phone calls.  - refill request can take up to 3-5 working days to complete.  - If request is sent electronically and request is appropiate, it is usually completed in 1-2 business days.  - all patients will need to be seen routinely for all chronic medical conditions requiring prescription medications (see follow-up below). If you are overdue for follow up on your condition, you will be asked to make an appointment and we will call in enough medication to cover you until your appointment (up to 30 days).  - all controlled substances will require a face to face visit to request/refill.  - if you desire your prescriptions to go through a new pharmacy, and have an active script at original pharmacy, you will need to call your pharmacy and have scripts transferred to new pharmacy. This is completed between the pharmacy locations and not by your provider.    Results: If any images or labs were ordered, it can take up to 1 week to get results depending on the test ordered and the  lab/facility running and resulting the test. - Normal or stable results, which do not need further discussion, will be released to your mychart immediately with attached note to you. A call will not be generated for normal results. Please make certain to sign up for mychart. If you have questions on how to activate your mychart you can call the front office.  - If your results need further discussion, our office will attempt to contact you via phone, and if unable to reach you after 2 attempts, we will release your abnormal result to your mychart with instructions.  - All results will be automatically released in mychart after 1 week.  - Your provider will provide you with explanation and instruction on all relevant material in your results. Please keep in mind, results and labs may appear confusing or abnormal to the untrained eye, but it does not mean they are actually abnormal for you personally. If you have any questions about your results that are not covered, or you desire more detailed explanation than what was provided, you should make an appointment with your provider to do so.   Our office handles many outgoing and incoming calls daily. If we have not contacted you within 1 week about your results, please check your mychart to see if there is a message first and if not, then contact our office.  In helping with this matter, you help decrease call volume, and therefore allow [place]  to be able to respond to patients needs more efficiently.   Acute office visits (sick visit):  An acute visit is intended for a new problem and are scheduled in shorter time slots to allow schedule openings for patients with new problems. This is the appropriate visit to discuss a new problem. In order to provide you with excellent quality medical care with proper time for you to explain your problem, have an exam and receive treatment with instructions, these appointments should be limited to one new problem per visit. If  you experience a new problem, in which you desire to be addressed, please make an acute office visit, we save openings on the schedule to accommodate you. Please do not save your new problem for any other type of visit, let us take care of it properly and quickly for you.   Follow up visits:  Depending on your condition(s) your provider will need to see you routinely in order to provide you with quality care and prescribe medication(s). Most chronic conditions (Example: hypertension, Diabetes, depression/anxiety... etc), require visits a couple times a year. Your provider will instruct you on proper follow up for your personal medical conditions and history. Please make certain to make follow up appointments for your condition as instructed. Failing to do so could result in lapse in your medication treatment/refills. If you request a refill, and are overdue to be seen on a condition, we will always provide you with a 30 day script (once) to allow you time to schedule.    Medicare wellness (well visit): - we have a wonderful Nurse Maudie Mercury), that will meet with you and provide you will yearly medicare wellness visits. These visits should occur yearly (can not be scheduled less than 1 calendar year apart) and cover preventive health, immunizations, advance directives and screenings you are entitled to yearly through your medicare benefits. Do not miss out on your entitled benefits, this is when medicare will pay for these benefits to be ordered for you.  These are strongly encouraged by your provider and is the appropriate type of visit to make certain you are up to date with all preventive health benefits. If you have not had your medicare wellness exam in the last 12 months, please make certain to schedule one by calling the office and schedule your medicare wellness with Maudie Mercury as soon as possible.   Yearly physical (well visit):  - Adults are recommended to be seen yearly for physicals. Check with your  insurance and date of your last physical, most insurances require one calendar year between physicals. Physicals include all preventive health topics, screenings, medical exam and labs that are appropriate for gender/age and history. You may have fasting labs needed at this visit. This is a well visit (not a sick visit), acute topics should not be covered during this visit.  - Pediatric patients are seen more frequently when they are younger. Your provider will advise you on well child visit timing that is appropriate for your their age. - This is not a medicare wellness visit. Medicare wellness exams do not have an exam portion to the visit. Some medicare companies allow for a physical, some do not allow a yearly physical. If your medicare allows a yearly physical you can schedule the medicare wellness with our nurse Maudie Mercury and have your physical with your provider after, on the same day. Please check with insurance for your full benefits.   Late Policy/No Shows:  - all new patients should arrive 15-30 minutes  earlier than appointment to allow [place] time  to  obtain all personal demographics,  insurance information and for you to complete office paperwork. - All established patients should arrive 10-15 minutes earlier than appointment time to update all information and be checked in .  - In our best efforts to run on time, if you are late for your appointment you will be asked to either reschedule or if able, we will work you back into the schedule. There will be a wait time to work you back in the schedule,  depending on availability.  - If you are unable to make it to your appointment as scheduled, please call 24 hours ahead of time to allow [place] to fill the time slot with someone else who needs to be seen. If you do not cancel your appointment ahead of time, you may be charged a no show fee.

## 2016-04-18 NOTE — Progress Notes (Signed)
[name] , 04/19/1956, 60 y.o., female MRN: [id number] Patient Care Team    Relationship Specialty Notifications Start End  Ma Hillock, DO PCP - General Family Medicine  04/23/15   Volanda Napoleon, MD Consulting Physician Oncology  05/12/13   Maisie Fus, MD Consulting Physician Obstetrics and Gynecology  01/01/15   Chesley Mires, MD Consulting Physician Pulmonary Disease  02/22/16     CC: urinary frequency Subjective: Pt presents for an acute OV with complaints of urinary frequency of 2 days  duration.  Associated symptoms include mild dysuria, chronic bronchitis symptoms returned as well. Patient has had multiple UTI and pyelonephritis in the past, E. Coli bacteria in each.  She has taken nothing for her symptoms. She denies fever, chills,  nausea, vomit, abdominal pain or flank pain. She is tolerating PO. She reports she "failed" at her smoking cessation.  No Known Allergies Social History  Substance Use Topics  . Smoking status: Current Every Day Smoker    Packs/day: 1.00    Years: 29.00    Types: Cigarettes    Start date: 06/01/1978  . Smokeless tobacco: Never Used  . Alcohol use 2.4 oz/week    4 Glasses of wine per week   Past Medical History:  Diagnosis Date  . Basal cell carcinoma   . History of acute bronchitis 03/24/2011  . Lymphadenopathy, abdominal 03/2013   Noted on abd/pelv CT done to eval slow-to-resolve pyelo  . Meniere disorder 01/23/2012  . Osteoporosis 12/2014   Dr. Nori Riis, her GYN, dx'd her  . Recurrent pyelonephritis   . Seasonal allergic rhinitis    Past Surgical History:  Procedure Laterality Date  . biopsy on skin cancer    . DEXA  12/2014   T score spine -2.4, hip -3.5 (Dr. Nori Riis, [facility].  [name] DILATION AND CURETTAGE OF UTERUS  2009   Family History  Problem Relation Age of Onset  . Hypertension Mother   . Glaucoma Mother   . Irritable bowel syndrome Mother   . Arthritis Mother   . Kidney disease Mother   .  Diabetes Mother   . Hypertension Father   . Heart disease Father     bypasses  . Cancer Father     sarcoma  . Cancer Maternal Grandmother     breast  . Heart disease Maternal Grandmother     CHF  . Stroke Maternal Grandmother     in his 29's  . Stroke Maternal Grandfather   . Other Paternal Grandmother     hardening of the arteries  . Heart attack Paternal Grandfather   . Colon cancer Paternal Aunt 44  . Stomach cancer Neg Hx    Allergies as of 04/18/2016   No Known Allergies     Medication List       Accurate as of 04/18/16  8:44 AM. Always use your most recent med list.          albuterol 108 (90 Base) MCG/ACT inhaler Commonly known as:  VENTOLIN HFA INHALE 2 PUFFS INTO THE LUNGS EVERY 6 (SIX) HOURS AS NEEDED FOR WHEEZING.   azelastine 0.1 % nasal spray Commonly known as:  ASTELIN Place 1 spray into both nostrils 2 (two) times daily. Use in each nostril as directed   buPROPion 150 MG 12 hr tablet Commonly known as:  WELLBUTRIN SR 150 mg QD for 3 days, then 150 mg BID   fluticasone-salmeterol 230-21 MCG/ACT inhaler Commonly known as:  ADVAIR HFA Inhale  2 puffs into the lungs 2 (two) times daily.   montelukast 10 MG tablet Commonly known as:  SINGULAIR Take 1 tablet (10 mg total) by mouth at bedtime.   multivitamin tablet Take 1 tablet by mouth daily.   nicotine 7 mg/24hr patch Commonly known as:  NICODERM CQ - dosed in mg/24 hr Place 1 patch (7 mg total) onto the skin daily.       Results for orders placed or performed in visit on 04/18/16 (from the past 24 hour(s))  POCT Urinalysis Dipstick (Automated)     Status: Abnormal   Collection Time: 04/18/16  8:25 AM  Result Value Ref Range   Color, UA dark yellow    Clarity, UA clear    Glucose, UA negative    Bilirubin, UA small    Ketones, UA negative    Spec Grav, UA 1.020    Blood, UA moderate    pH, UA 6.5    Protein, UA 30mg     Urobilinogen, UA 1.0    Nitrite, UA negative    Leukocytes, UA  Trace (A) Negative   No results found.   ROS: Negative, with the exception of above mentioned in HPI   Objective:  BP 131/84 (BP Location: Left Arm, Patient Position: Sitting, Cuff Size: Normal)   Pulse 97   Temp 99.6 F (37.6 C)   Resp 20   Wt 108 lb 8 oz (49.2 kg)   SpO2 92%   BMI 18.06 kg/m  Body mass index is 18.06 kg/m. Gen: Afebrile. No acute distress. Nontoxic in appearance, well developed, thin female.  HENT: AT. [place]. Bilateral TM visualized wnl. MMM, no oral lesions. Bilateral nares wnl. Throat without erythema or exudates. Chronic cough present.  Eyes:Pupils Equal Round Reactive to light, Extraocular movements intact,  Conjunctiva without redness, discharge or icterus. Neck/lymp/endocrine: Supple,no lymphadenopathy CV: RRR Chest: CTAB, no wheeze or crackles. diminished lung sounds bilaterally.  Abd: Soft. NTND. BS present.  MSK: No CVA tenderenss Neuro:  Normal gait. PERLA. EOMi. Alert. Oriented x3   Results for orders placed or performed in visit on 04/18/16 (from the past 24 hour(s))  POCT Urinalysis Dipstick (Automated)     Status: Abnormal   Collection Time: 04/18/16  8:25 AM  Result Value Ref Range   Color, UA dark yellow    Clarity, UA clear    Glucose, UA negative    Bilirubin, UA small    Ketones, UA negative    Spec Grav, UA 1.020    Blood, UA moderate    pH, UA 6.5    Protein, UA 30mg     Urobilinogen, UA 1.0    Nitrite, UA negative    Leukocytes, UA Trace (A) Negative    Assessment/Plan: [name] is a 60 y.o. female present for OV for  Urinary frequency/Abnormal urine findings/Urinary tract infection with hematuria, site unspecified - POCT Urinalysis Dipstick (Automated) - Urine Culture - Basic Metabolic Panel (BMET) - Will treat with Augmentin, to also potentially cover URI if needed. Prior E. Coli infections susceptible.  - F/U if symptoms do not resolve in 1 week.   Pre-procedure lab exam/Mediastinal adenopathy/Mucopurulent chronic  bronchitis ([place]) - Basic Metabolic Panel (BMET) - CT chest with contrast had been ordered for her mediastinography and pt would now like to proceed with CT. BMP ordered.   Cigarette nicotine dependence with other nicotine-induced disorder/Current every day smoker/Encounter for smoking cessation counseling - she is back to smoking, offered cessation help when she is ready to try  again.     electronically signed by:  Howard Pouch, DO  [facility]

## 2016-04-20 LAB — URINE CULTURE: Organism ID, Bacteria: NO GROWTH

## 2016-04-21 ENCOUNTER — Encounter (HOSPITAL_BASED_OUTPATIENT_CLINIC_OR_DEPARTMENT_OTHER): Payer: Self-pay

## 2016-04-21 ENCOUNTER — Ambulatory Visit (HOSPITAL_BASED_OUTPATIENT_CLINIC_OR_DEPARTMENT_OTHER)
Admission: RE | Admit: 2016-04-21 | Discharge: 2016-04-21 | Disposition: A | Discharge status: Home / Self Care | Payer: BLUE CROSS/BLUE SHIELD | Source: Ambulatory Visit | Attending: Family Medicine | Admitting: Family Medicine

## 2016-04-21 ENCOUNTER — Telehealth: Payer: Self-pay | Admitting: Family Medicine

## 2016-04-21 DIAGNOSIS — I2510 Atherosclerotic heart disease of native coronary artery without angina pectoris: Secondary | ICD-10-CM | POA: Diagnosis not present

## 2016-04-21 DIAGNOSIS — R590 Localized enlarged lymph nodes: Secondary | ICD-10-CM | POA: Diagnosis not present

## 2016-04-21 DIAGNOSIS — M8448XA Pathological fracture, other site, initial encounter for fracture: Secondary | ICD-10-CM | POA: Diagnosis not present

## 2016-04-21 DIAGNOSIS — J219 Acute bronchiolitis, unspecified: Secondary | ICD-10-CM | POA: Diagnosis not present

## 2016-04-21 DIAGNOSIS — J42 Unspecified chronic bronchitis: Secondary | ICD-10-CM | POA: Diagnosis not present

## 2016-04-21 DIAGNOSIS — F17200 Nicotine dependence, unspecified, uncomplicated: Secondary | ICD-10-CM | POA: Insufficient documentation

## 2016-04-21 DIAGNOSIS — J441 Chronic obstructive pulmonary disease with (acute) exacerbation: Secondary | ICD-10-CM

## 2016-04-21 DIAGNOSIS — R05 Cough: Secondary | ICD-10-CM | POA: Diagnosis present

## 2016-04-21 DIAGNOSIS — K219 Gastro-esophageal reflux disease without esophagitis: Secondary | ICD-10-CM | POA: Insufficient documentation

## 2016-04-21 DIAGNOSIS — R059 Cough, unspecified: Secondary | ICD-10-CM

## 2016-04-21 MED ORDER — IOPAMIDOL (ISOVUE-300) INJECTION 61%
100.0000 mL | Freq: Once | INTRAVENOUS | Status: AC | PRN
  Administered 2016-04-21: 80 mL via INTRAVENOUS

## 2016-04-21 NOTE — Telephone Encounter (Signed)
noted 

## 2016-04-21 NOTE — Telephone Encounter (Signed)
Patient returned call, stated that she was feeling some better. Temperature is gone but still has a twinge when urinating.  She stated that she would finish antibiotic and call back if not improved.

## 2016-04-21 NOTE — Telephone Encounter (Signed)
Message left on patient's voicemail to return call.

## 2016-04-21 NOTE — Telephone Encounter (Signed)
Please call [name]: Her urine did not grow a bacteria, therefore her discomfort was not from an infection. She can continue abx if she is improving, if she is not improving, I would want to see her again.

## 2016-04-23 ENCOUNTER — Telehealth: Payer: Self-pay | Admitting: Family Medicine

## 2016-04-23 DIAGNOSIS — R918 Other nonspecific abnormal finding of lung field: Secondary | ICD-10-CM | POA: Insufficient documentation

## 2016-04-23 DIAGNOSIS — J411 Mucopurulent chronic bronchitis: Secondary | ICD-10-CM

## 2016-04-23 DIAGNOSIS — F17218 Nicotine dependence, cigarettes, with other nicotine-induced disorders: Secondary | ICD-10-CM

## 2016-04-23 DIAGNOSIS — R590 Localized enlarged lymph nodes: Secondary | ICD-10-CM

## 2016-04-23 NOTE — Telephone Encounter (Signed)
Attempted to call patient this evening to discuss her CT results. Both her home and cell phone numbers were not available to leave messages. - Please attempt to call patient - CT chest had showed signs of possible infection, as well as increasing/progressive lymphadenopathy from prior studies. Both requiring pulmonology referral to be placed for further evaluation on each. The infection, if present, could be an atypical bacteria. This is a condition  pulmonology will need to evaluate with special tests and follow routinely if needed. - If she has more questions, or she has unresolved symptoms in her chest, please have her follow-up and I would be happy to explain in detail.

## 2016-04-24 DIAGNOSIS — Z01419 Encounter for gynecological examination (general) (routine) without abnormal findings: Secondary | ICD-10-CM | POA: Diagnosis not present

## 2016-04-24 DIAGNOSIS — Z1231 Encounter for screening mammogram for malignant neoplasm of breast: Secondary | ICD-10-CM | POA: Diagnosis not present

## 2016-04-24 DIAGNOSIS — Z681 Body mass index (BMI) 19 or less, adult: Secondary | ICD-10-CM | POA: Diagnosis not present

## 2016-04-24 NOTE — Telephone Encounter (Signed)
Left message on patient voicemail to return call.

## 2016-04-24 NOTE — Telephone Encounter (Signed)
Tried to call patient phone not accepting messages.

## 2016-04-25 NOTE — Telephone Encounter (Signed)
Spoke with patient reviewed results and information regarding referral to pulmonology. advised patient to call and schedule appt with Dr Raoul Pitch if she had any further questions.

## 2016-05-20 DIAGNOSIS — N9089 Other specified noninflammatory disorders of vulva and perineum: Secondary | ICD-10-CM | POA: Diagnosis not present

## 2016-05-20 DIAGNOSIS — N750 Cyst of Bartholin's gland: Secondary | ICD-10-CM | POA: Diagnosis not present

## 2016-05-20 DIAGNOSIS — D4959 Neoplasm of unspecified behavior of other genitourinary organ: Secondary | ICD-10-CM | POA: Diagnosis not present

## 2016-06-04 ENCOUNTER — Encounter: Payer: Self-pay | Admitting: Family Medicine

## 2016-06-04 ENCOUNTER — Ambulatory Visit (INDEPENDENT_AMBULATORY_CARE_PROVIDER_SITE_OTHER): Payer: BLUE CROSS/BLUE SHIELD | Admitting: Family Medicine

## 2016-06-04 VITALS — BP 146/82 | HR 79 | Temp 102.0°F | Resp 20 | Wt 110.8 lb

## 2016-06-04 DIAGNOSIS — J111 Influenza due to unidentified influenza virus with other respiratory manifestations: Secondary | ICD-10-CM

## 2016-06-04 DIAGNOSIS — R6889 Other general symptoms and signs: Secondary | ICD-10-CM | POA: Diagnosis not present

## 2016-06-04 LAB — POC INFLUENZA A&B (BINAX/QUICKVUE)
Influenza A, POC: NEGATIVE
Influenza B, POC: NEGATIVE

## 2016-06-04 MED ORDER — OSELTAMIVIR PHOSPHATE 75 MG PO CAPS: 75.0000 mg | ORAL_CAPSULE | Freq: Two times a day (BID) | ORAL | 0 refills | Status: DC

## 2016-06-04 MED ORDER — AMOXICILLIN-POT CLAVULANATE 875-125 MG PO TABS: 1.0000 | ORAL_TABLET | Freq: Two times a day (BID) | ORAL | 0 refills | Status: DC

## 2016-06-04 NOTE — Patient Instructions (Addendum)
Rest and hydrate.  Mucinex.   Augmentin every 12 hours for 10 days.  Tamiflu is every 12 hours for 5 days.   Your rapid flu was negative, I truly believe this is a false negative with your symptoms and I am treating you for the flu and preventing co-infection with Augmentin given your lung disease.     Influenza, Adult Influenza ("the flu") is an infection in the lungs, nose, and throat (respiratory tract). It is caused by a virus. The flu causes many common cold symptoms, as well as a high fever and body aches. It can make you feel very sick. The flu spreads easily from person to person (is contagious). Getting a flu shot (influenza vaccination) every year is the best way to prevent the flu. Follow these instructions at home:  Take over-the-counter and prescription medicines only as told by your doctor.  Use a cool mist humidifier to add moisture (humidity) to the air in your home. This can make it easier to breathe.  Rest as needed.  Drink enough fluid to keep your pee (urine) clear or pale yellow.  Cover your mouth and nose when you cough or sneeze.  Wash your hands with soap and water often, especially after you cough or sneeze. If you cannot use soap and water, use hand sanitizer.  Stay home from work or school as told by your doctor. Unless you are visiting your doctor, try to avoid leaving home until your fever has been gone for 24 hours without the use of medicine.  Keep all follow-up visits as told by your doctor. This is important. How is this prevented?  Getting a yearly (annual) flu shot is the best way to avoid getting the flu. You may get the flu shot in late summer, fall, or winter. Ask your doctor when you should get your flu shot.  Wash your hands often or use hand sanitizer often.  Avoid contact with people who are sick during cold and flu season.  Eat healthy foods.  Drink plenty of fluids.  Get enough sleep.  Exercise regularly. Contact a doctor  if:  You get new symptoms.  You have:  Chest pain.  Watery poop (diarrhea).  A fever.  Your cough gets worse.  You start to have more mucus.  You feel sick to your stomach (nauseous).  You throw up (vomit). Get help right away if:  You start to be short of breath or have trouble breathing.  Your skin or nails turn a bluish color.  You have very bad pain or stiffness in your neck.  You get a sudden headache.  You get sudden pain in your face or ear.  You cannot stop throwing up. This information is not intended to replace advice given to you by your health care provider. Make sure you discuss any questions you have with your health care provider. Document Released: 01/29/2008 Document Revised: 09/27/2015 Document Reviewed: 02/13/2015 Elsevier Interactive Patient Education  2017 [employer].

## 2016-06-04 NOTE — Progress Notes (Signed)
[name] , 06/09/1955, 61 y.o., female MRN: [id number] Patient Care Team    Relationship Specialty Notifications Start End  Ma Hillock, DO PCP - General Family Medicine  04/23/15   Volanda Napoleon, MD Consulting Physician Oncology  05/12/13   Maisie Fus, MD Consulting Physician Obstetrics and Gynecology  01/01/15   Chesley Mires, MD Consulting Physician Pulmonary Disease  02/22/16   Carolan Clines, MD Consulting Physician Urology  04/21/16     CC: fever Subjective: Pt presents for an acute OV with complaints of fever of 2 days duration.  Associated symptoms include headache, fever, congestion (nasal and chest), mild cough and myalgias. She endorses a fever at home of 101.52F. Pt has tried advil/tylenol, sudafed, mucinex to ease their symptoms. She had her flu shot this year. Her grandchildren are sick.   No Known Allergies Social History  Substance Use Topics  . Smoking status: Current Every Day Smoker    Packs/day: 1.00    Years: 29.00    Types: Cigarettes    Start date: 06/01/1978  . Smokeless tobacco: Never Used  . Alcohol use 2.4 oz/week    4 Glasses of wine per week   Past Medical History:  Diagnosis Date  . Basal cell carcinoma   . History of acute bronchitis 03/24/2011  . Lymphadenopathy, abdominal 03/2013   Noted on abd/pelv CT done to eval slow-to-resolve pyelo  . Meniere disorder 01/23/2012  . Osteoporosis 12/2014   Dr. Nori Riis, her GYN, dx'd her  . Recurrent pyelonephritis   . Seasonal allergic rhinitis    Past Surgical History:  Procedure Laterality Date  . biopsy on skin cancer    . DEXA  12/2014   T score spine -2.4, hip -3.5 (Dr. Nori Riis, [facility].  [name] DILATION AND CURETTAGE OF UTERUS  2009   Family History  Problem Relation Age of Onset  . Hypertension Mother   . Glaucoma Mother   . Irritable bowel syndrome Mother   . Arthritis Mother   . Kidney disease Mother   . Diabetes Mother   . Hypertension Father   . Heart  disease Father     bypasses  . Cancer Father     sarcoma  . Cancer Maternal Grandmother     breast  . Heart disease Maternal Grandmother     CHF  . Stroke Maternal Grandmother     in his 67's  . Stroke Maternal Grandfather   . Other Paternal Grandmother     hardening of the arteries  . Heart attack Paternal Grandfather   . Colon cancer Paternal Aunt 71  . Stomach cancer Neg Hx    Allergies as of 06/04/2016   No Known Allergies     Medication List       Accurate as of 06/04/16 10:49 AM. Always use your most recent med list.          albuterol 108 (90 Base) MCG/ACT inhaler Commonly known as:  VENTOLIN HFA INHALE 2 PUFFS INTO THE LUNGS EVERY 6 (SIX) HOURS AS NEEDED FOR WHEEZING.   amoxicillin-clavulanate 875-125 MG tablet Commonly known as:  AUGMENTIN Take 1 tablet by mouth 2 (two) times daily.   azelastine 0.1 % nasal spray Commonly known as:  ASTELIN Place 1 spray into both nostrils 2 (two) times daily. Use in each nostril as directed   fluticasone-salmeterol 230-21 MCG/ACT inhaler Commonly known as:  ADVAIR HFA Inhale 2 puffs into the lungs 2 (two) times daily.   montelukast 10  MG tablet Commonly known as:  SINGULAIR Take 1 tablet (10 mg total) by mouth at bedtime.   multivitamin tablet Take 1 tablet by mouth daily.   oseltamivir 75 MG capsule Commonly known as:  TAMIFLU Take 1 capsule (75 mg total) by mouth 2 (two) times daily.       Results for orders placed or performed in visit on 06/04/16 (from the past 24 hour(s))  POC Influenza A&B (Binax test)     Status: Normal   Collection Time: 06/04/16 10:42 AM  Result Value Ref Range   Influenza A, POC Negative Negative   Influenza B, POC Negative Negative   No results found.   ROS: Negative, with the exception of above mentioned in HPI   Objective:  BP (!) 146/82 (BP Location: Right Arm, Patient Position: Sitting, Cuff Size: Normal)   Pulse 79   Temp (!) 102 F (38.9 C)   Resp 20   Wt 110 lb 12  oz (50.2 kg)   SpO2 92%   BMI 18.43 kg/m  Body mass index is 18.43 kg/m. Gen: febrile. No acute distress. Nontoxic in appearance, well developed, well nourished. Appears ill today.  HENT: AT. [place]. Bilateral TM visualized wnl. MMM, no oral lesions. Bilateral nares with erythema, swelling and drainage. Throat without erythema or exudates. Mild cough, no hoarseness, no TTP sinus.  Eyes:Pupils Equal Round Reactive to light, Extraocular movements intact,  Conjunctiva without redness, discharge or icterus. Neck/lymp/endocrine: Supple,no lymphadenopathy CV: RRR  Chest: CTAB, no wheeze or crackles. Mild rhonchi, cleared with cough.Good air movement, normal resp effort.  Abd: Soft. NTND. BS present.  Skin: no rashes, purpura or petechiae.  Neuro: Normal gait. PERLA. EOMi. Alert. Oriented x3   Results for orders placed or performed in visit on 06/04/16 (from the past 24 hour(s))  POC Influenza A&B (Binax test)     Status: Normal   Collection Time: 06/04/16 10:42 AM  Result Value Ref Range   Influenza A, POC Negative Negative   Influenza B, POC Negative Negative     Assessment/Plan: [name] is a 61 y.o. female present for acute OV for  Flu-like symptoms - POC Influenza A&B (Binax test) Influenza - influenza test negative today. Pt has rather classic symptoms, febrile and known lung disease. Discussed treatment plan with pt and she Agreed to treat with augmentin and tamiflu.  Rest, hydrate. Mucinex. Advil/tylenol. - return precautions discussed. If worsening symptoms or fever remains after 24-48 hours after abx start pt is to be seen immediately.     electronically signed by:  Howard Pouch, DO  [place]

## 2016-06-11 ENCOUNTER — Telehealth: Payer: Self-pay | Admitting: Family Medicine

## 2016-06-11 ENCOUNTER — Ambulatory Visit (HOSPITAL_BASED_OUTPATIENT_CLINIC_OR_DEPARTMENT_OTHER)
Admission: RE | Admit: 2016-06-11 | Discharge: 2016-06-11 | Disposition: A | Discharge status: Home / Self Care | Payer: BLUE CROSS/BLUE SHIELD | Source: Ambulatory Visit | Attending: Family Medicine | Admitting: Family Medicine

## 2016-06-11 ENCOUNTER — Encounter: Payer: Self-pay | Admitting: Family Medicine

## 2016-06-11 ENCOUNTER — Ambulatory Visit (INDEPENDENT_AMBULATORY_CARE_PROVIDER_SITE_OTHER): Payer: BLUE CROSS/BLUE SHIELD | Admitting: Family Medicine

## 2016-06-11 VITALS — BP 130/90 | HR 71 | Temp 100.9°F | Resp 20 | Wt 108.8 lb

## 2016-06-11 DIAGNOSIS — J189 Pneumonia, unspecified organism: Secondary | ICD-10-CM | POA: Diagnosis not present

## 2016-06-11 DIAGNOSIS — J439 Emphysema, unspecified: Secondary | ICD-10-CM | POA: Diagnosis not present

## 2016-06-11 DIAGNOSIS — R05 Cough: Secondary | ICD-10-CM

## 2016-06-11 DIAGNOSIS — R059 Cough, unspecified: Secondary | ICD-10-CM

## 2016-06-11 DIAGNOSIS — J181 Lobar pneumonia, unspecified organism: Secondary | ICD-10-CM

## 2016-06-11 MED ORDER — LEVOFLOXACIN 750 MG PO TABS
750.0000 mg | ORAL_TABLET | Freq: Every day | ORAL | 0 refills | Status: DC
Start: 2016-06-11 — End: 2016-06-26

## 2016-06-11 NOTE — Telephone Encounter (Signed)
Spoke with patient she will go by to get her xray prior to her appt.

## 2016-06-11 NOTE — Telephone Encounter (Signed)
Please call pt: - She made a follow up appt this afternoon because she is not feeling better. - She was covered for PNA with augmentin.  - however, she may need different coverage if not responding. If she is able I would like her to have xray before her appt today. If she can not that is ok. I have ordered it at [facility].

## 2016-06-11 NOTE — Patient Instructions (Signed)
Start levaquin for 5 days.  If fever still present Friday call in and we may say got to ED.  Rest and hydrate.   I want to see you in 2 weeks, with a repeat cxr 1 day prior.

## 2016-06-11 NOTE — Progress Notes (Signed)
[name] , [birth year], 61 y.o., female MRN: [id number] Patient Care Team    Relationship Specialty Notifications Start End  Ma Hillock, DO PCP - General Family Medicine  04/23/15   Volanda Napoleon, MD Consulting Physician Oncology  05/12/13   Maisie Fus, MD Consulting Physician Obstetrics and Gynecology  01/01/15   Chesley Mires, MD Consulting Physician Pulmonary Disease  02/22/16   Carolan Clines, MD Consulting Physician Urology  04/21/16     CC: fever Subjective:  Patient has continued to have fever despite treatment with tamiflu and augmentin last week. She still feels fatigued, cough, fever, chills and left sided chest discomfort. She is eating and drinking ok. She has known lung disease and has a pulmonology establishment appt soon.   Prior note: Pt presents for an acute OV with complaints of fever of 2 days duration.  Associated symptoms include headache, fever, congestion (nasal and chest), mild cough and myalgias. She endorses a fever at home of 101.13F. Pt has tried advil/tylenol, sudafed, mucinex to ease their symptoms. She had her flu shot this year. Her grandchildren are sick.   No Known Allergies Social History  Substance Use Topics  . Smoking status: Current Every Day Smoker    Packs/day: 1.00    Years: 29.00    Types: Cigarettes    Start date: 06/01/1978  . Smokeless tobacco: Never Used  . Alcohol use 2.4 oz/week    4 Glasses of wine per week   Past Medical History:  Diagnosis Date  . Basal cell carcinoma   . History of acute bronchitis 03/24/2011  . Lymphadenopathy, abdominal 03/2013   Noted on abd/pelv CT done to eval slow-to-resolve pyelo  . Meniere disorder 01/23/2012  . Osteoporosis 12/2014   Dr. Nori Riis, her GYN, dx'd her  . Recurrent pyelonephritis   . Seasonal allergic rhinitis    Past Surgical History:  Procedure Laterality Date  . biopsy on skin cancer    . DEXA  12/2014   T score spine -2.4, hip -3.5 (Dr. Nori Riis, [facility] for  [facility].  [name] DILATION AND CURETTAGE OF UTERUS  2009   Family History  Problem Relation Age of Onset  . Hypertension Mother   . Glaucoma Mother   . Irritable bowel syndrome Mother   . Arthritis Mother   . Kidney disease Mother   . Diabetes Mother   . Hypertension Father   . Heart disease Father     bypasses  . Cancer Father     sarcoma  . Cancer Maternal Grandmother     breast  . Heart disease Maternal Grandmother     CHF  . Stroke Maternal Grandmother     in his 32's  . Stroke Maternal Grandfather   . Other Paternal Grandmother     hardening of the arteries  . Heart attack Paternal Grandfather   . Colon cancer Paternal Aunt 29  . Stomach cancer Neg Hx    Allergies as of 06/11/2016   No Known Allergies     Medication List       Accurate as of 06/11/16  3:42 PM. Always use your most recent med list.          albuterol (2.5 MG/3ML) 0.083% nebulizer solution Commonly known as:  PROVENTIL Take 2.5 mg by nebulization every 6 (six) hours as needed for wheezing or shortness of breath.   albuterol 108 (90 Base) MCG/ACT inhaler Commonly known as:  VENTOLIN HFA INHALE 2 PUFFS INTO THE LUNGS EVERY 6 (  SIX) HOURS AS NEEDED FOR WHEEZING.   amoxicillin-clavulanate 875-125 MG tablet Commonly known as:  AUGMENTIN Take 1 tablet by mouth 2 (two) times daily.   azelastine 0.1 % nasal spray Commonly known as:  ASTELIN Place 1 spray into both nostrils 2 (two) times daily. Use in each nostril as directed   fluticasone-salmeterol 230-21 MCG/ACT inhaler Commonly known as:  ADVAIR HFA Inhale 2 puffs into the lungs 2 (two) times daily.   montelukast 10 MG tablet Commonly known as:  SINGULAIR Take 1 tablet (10 mg total) by mouth at bedtime.   multivitamin tablet Take 1 tablet by mouth daily.       No results found for this or any previous visit (from the past 24 hour(s)). Dg Chest 2 View  Result Date: 06/11/2016 CLINICAL DATA:  Cough, fever, fatigue EXAM: CHEST  2 VIEW  COMPARISON:  02/22/2016 FINDINGS: Acute opacity at the left base with small pleural effusion. Based on 04/21/2016 chest CT there is may be chronic indolent airway infection at the bases. Hyperinflation and emphysematous changes. Normal heart size and mediastinal contours. IMPRESSION: 1. Acute pneumonia at the left base. 2. Emphysema. Electronically Signed   By: Monte Fantasia M.D.   On: 06/11/2016 15:35     ROS: Negative, with the exception of above mentioned in HPI   Objective:  BP 130/90 (BP Location: Right Arm, Patient Position: Sitting, Cuff Size: Small)   Pulse 71   Temp (!) 100.9 F (38.3 C)   Resp 20   Wt 108 lb 12 oz (49.3 kg)   SpO2 95%   BMI 18.10 kg/m  Body mass index is 18.1 kg/m. Gen: febrile. No acute distress. nontoxic in appearance. Thin pleasant caucasian female.  HENT: AT. [place]. Bilateral TM visualized and normal in appearance. MMM. Bilateral nares mild erythema, no drainage. Throat without erythema or exudates. Cough and hoarseness present.  Eyes:Pupils Equal Round Reactive to light, Extraocular movements intact,  Conjunctiva without redness, discharge or icterus. Neck/lymp/endocrine: Supple,no lymphadenopathy CV: RRR Chest: diminished breath sounds bilateral. Crackles LLL present. Cough present. Mild increase in WOB.  Abd: Soft. NTND. BS present Skin: no rashes, purpura or petechiae.  Neuro:  Normal gait. PERLA. EOMi. Alert. Oriented x3  No results found for this or any previous visit (from the past 24 hour(s)). Dg Chest 2 View  Result Date: 06/11/2016 CLINICAL DATA:  Cough, fever, fatigue EXAM: CHEST  2 VIEW COMPARISON:  02/22/2016 FINDINGS: Acute opacity at the left base with small pleural effusion. Based on 04/21/2016 chest CT there is may be chronic indolent airway infection at the bases. Hyperinflation and emphysematous changes. Normal heart size and mediastinal contours. IMPRESSION: 1. Acute pneumonia at the left base. 2. Emphysema. Electronically Signed   By:  Monte Fantasia M.D.   On: 06/11/2016 15:35     Assessment/Plan: [name] is a 61 y.o. female present for acute OV for  Pneumonia of left lower lobe due to infectious organism ([place]) - despite tamiflu and Augmentin coverage, she has had persistent fevers and Left sided chest discomfort.  - CXR today resulted with LLL PNA - DC sugmentin and start LQ 750 x 5 days - rest and hydrate - f/u 2 weeks for follow up and repeat cxr    electronically signed by:  Howard Pouch, DO  [place]

## 2016-06-11 NOTE — Telephone Encounter (Signed)
Patient states she was seen last week and still feels bad.  Fever is 101.6 this morning and she feels fatigued.  Pt wants to know what she should do.

## 2016-06-11 NOTE — Telephone Encounter (Signed)
Per Dr Lucita Lora note patient is to be seen if fever persisted . I scheduled appt for patient to be seen today.

## 2016-06-25 DIAGNOSIS — C44609 Unspecified malignant neoplasm of skin of left upper limb, including shoulder: Secondary | ICD-10-CM | POA: Diagnosis not present

## 2016-06-25 DIAGNOSIS — L821 Other seborrheic keratosis: Secondary | ICD-10-CM | POA: Diagnosis not present

## 2016-06-25 DIAGNOSIS — C44602 Unspecified malignant neoplasm of skin of right upper limb, including shoulder: Secondary | ICD-10-CM | POA: Diagnosis not present

## 2016-06-25 DIAGNOSIS — Z85828 Personal history of other malignant neoplasm of skin: Secondary | ICD-10-CM | POA: Diagnosis not present

## 2016-06-25 DIAGNOSIS — D485 Neoplasm of uncertain behavior of skin: Secondary | ICD-10-CM | POA: Diagnosis not present

## 2016-06-25 DIAGNOSIS — C44529 Squamous cell carcinoma of skin of other part of trunk: Secondary | ICD-10-CM | POA: Diagnosis not present

## 2016-06-25 DIAGNOSIS — D1801 Hemangioma of skin and subcutaneous tissue: Secondary | ICD-10-CM | POA: Diagnosis not present

## 2016-06-25 DIAGNOSIS — C4442 Squamous cell carcinoma of skin of scalp and neck: Secondary | ICD-10-CM | POA: Diagnosis not present

## 2016-06-25 DIAGNOSIS — L570 Actinic keratosis: Secondary | ICD-10-CM | POA: Diagnosis not present

## 2016-06-25 DIAGNOSIS — L814 Other melanin hyperpigmentation: Secondary | ICD-10-CM | POA: Diagnosis not present

## 2016-06-26 ENCOUNTER — Encounter: Payer: Self-pay | Admitting: Pulmonary Disease

## 2016-06-26 ENCOUNTER — Other Ambulatory Visit (INDEPENDENT_AMBULATORY_CARE_PROVIDER_SITE_OTHER): Payer: BLUE CROSS/BLUE SHIELD

## 2016-06-26 ENCOUNTER — Ambulatory Visit (INDEPENDENT_AMBULATORY_CARE_PROVIDER_SITE_OTHER): Payer: BLUE CROSS/BLUE SHIELD | Admitting: Pulmonary Disease

## 2016-06-26 ENCOUNTER — Encounter: Payer: Self-pay | Admitting: Family Medicine

## 2016-06-26 VITALS — BP 132/80 | HR 89 | Ht 65.0 in | Wt 109.2 lb

## 2016-06-26 DIAGNOSIS — A310 Pulmonary mycobacterial infection: Secondary | ICD-10-CM

## 2016-06-26 DIAGNOSIS — J449 Chronic obstructive pulmonary disease, unspecified: Secondary | ICD-10-CM | POA: Diagnosis not present

## 2016-06-26 DIAGNOSIS — Z720 Tobacco use: Secondary | ICD-10-CM

## 2016-06-26 LAB — CBC WITH DIFFERENTIAL/PLATELET
Basophils Absolute: 0 10*3/uL (ref 0.0–0.1)
Basophils Relative: 0.2 % (ref 0.0–3.0)
EOS ABS: 0.2 10*3/uL (ref 0.0–0.7)
EOS PCT: 1.4 % (ref 0.0–5.0)
HCT: 48 % — ABNORMAL HIGH (ref 36.0–46.0)
Hemoglobin: 15.7 g/dL — ABNORMAL HIGH (ref 12.0–15.0)
LYMPHS ABS: 2.3 10*3/uL (ref 0.7–4.0)
Lymphocytes Relative: 19.2 % (ref 12.0–46.0)
MCHC: 32.8 g/dL (ref 30.0–36.0)
MCV: 96 fl (ref 78.0–100.0)
MONO ABS: 0.8 10*3/uL (ref 0.1–1.0)
Monocytes Relative: 6.5 % (ref 3.0–12.0)
NEUTROS PCT: 72.7 % (ref 43.0–77.0)
Neutro Abs: 8.7 10*3/uL — ABNORMAL HIGH (ref 1.4–7.7)
Platelets: 275 10*3/uL (ref 150.0–400.0)
RBC: 5 Mil/uL (ref 3.87–5.11)
RDW: 14.1 % (ref 11.5–15.5)
WBC: 11.9 10*3/uL — ABNORMAL HIGH (ref 4.0–10.5)

## 2016-06-26 LAB — COMPREHENSIVE METABOLIC PANEL
ALK PHOS: 83 U/L (ref 39–117)
ALT: 21 U/L (ref 0–35)
AST: 32 U/L (ref 0–37)
Albumin: 4.1 g/dL (ref 3.5–5.2)
BILIRUBIN TOTAL: 0.6 mg/dL (ref 0.2–1.2)
BUN: 12 mg/dL (ref 6–23)
CO2: 31 mEq/L (ref 19–32)
CREATININE: 0.87 mg/dL (ref 0.40–1.20)
Calcium: 9.5 mg/dL (ref 8.4–10.5)
Chloride: 104 mEq/L (ref 96–112)
GFR: 70.54 mL/min (ref >60.00)
GLUCOSE: 100 mg/dL — AB (ref 70–99)
POTASSIUM: 4.6 meq/L (ref 3.5–5.1)
SODIUM: 138 meq/L (ref 135–145)
TOTAL PROTEIN: 7.3 g/dL (ref 6.0–8.3)

## 2016-06-26 MED ORDER — BUPROPION HCL ER (SMOKING DET) 150 MG PO TB12: 150.0000 mg | ORAL_TABLET | Freq: Two times a day (BID) | ORAL | 3 refills | Status: DC

## 2016-06-26 NOTE — Progress Notes (Signed)
Past surgical history She  has a past surgical history that includes biopsy on skin cancer; Dilation and curettage of uterus (2009); and DEXA (12/2014).  Family history Her family history includes Arthritis in her mother; Cancer in her father and maternal grandmother; Colon cancer (age of onset: 80) in her paternal aunt; Diabetes in her mother; Glaucoma in her mother; Heart attack in her paternal grandfather; Heart disease in her father and maternal grandmother; Hypertension in her father and mother; Irritable bowel syndrome in her mother; Kidney disease in her mother; Other in her paternal grandmother; Stroke in her maternal grandfather and maternal grandmother.  Social history She  reports that she has been smoking Cigarettes.  She started smoking about 38 years ago. She has a 29.00 pack-year smoking history. She has never used smokeless tobacco. She reports that she drinks about 4.2 oz of alcohol per week . She reports that she does not use drugs.  No Known Allergies   Review of Systems  Constitutional: Negative for fever and unexpected weight change.  HENT: Negative for congestion, dental problem, ear pain, nosebleeds, postnasal drip, rhinorrhea, sinus pressure, sneezing, sore throat and trouble swallowing.   Eyes: Negative for redness and itching.  Respiratory: Positive for cough. Negative for chest tightness, shortness of breath and wheezing.   Cardiovascular: Negative for palpitations and leg swelling.  Gastrointestinal: Negative for nausea and vomiting.  Genitourinary: Negative for dysuria.  Musculoskeletal: Negative for joint swelling.       RA, joint stiffness  Skin: Negative for rash.  Neurological: Negative for headaches.  Hematological: Does not bruise/bleed easily.  Psychiatric/Behavioral: Negative for dysphoric mood. The patient is not nervous/anxious.     Current Outpatient Prescriptions on File Prior to Visit  Medication Sig  . albuterol (PROVENTIL) (2.5 MG/3ML) 0.083%  nebulizer solution Take 2.5 mg by nebulization every 6 (six) hours as needed for wheezing or shortness of breath.  [name] albuterol (VENTOLIN HFA) 108 (90 Base) MCG/ACT inhaler INHALE 2 PUFFS INTO THE LUNGS EVERY 6 (SIX) HOURS AS NEEDED FOR WHEEZING.  [name] azelastine (ASTELIN) 0.1 % nasal spray Place 1 spray into both nostrils 2 (two) times daily. Use in each nostril as directed  . fluticasone-salmeterol (ADVAIR HFA) 230-21 MCG/ACT inhaler Inhale 2 puffs into the lungs 2 (two) times daily.  . montelukast (SINGULAIR) 10 MG tablet Take 1 tablet (10 mg total) by mouth at bedtime.  . Multiple Vitamin (MULTIVITAMIN) tablet Take 1 tablet by mouth daily.   No current facility-administered medications on file prior to visit.     Chief Complaint  Patient presents with  . PULMONARY CONSULT    Referred by Dr Raoul Pitch for abn CT. Former [facility] patient. Pt reports having PNA recently as well.     Pulmonary tests PFT 12/15/08 FEV1 2.20(90%), FVC 3.68(113%), FEV1% 60, TLC 6.54(132%), DLCO 75%, no BD response CT chest 04/21/16 >> borderline LAN, nodules in lung bases with tree in bud  Past medical history She  has a past medical history of Basal cell carcinoma; History of acute bronchitis (03/24/2011); Lymphadenopathy, abdominal (03/2013); Meniere disorder (01/23/2012); Osteoporosis (12/2014); Recurrent pyelonephritis; and Seasonal allergic rhinitis.  Vital signs BP 132/80 (BP Location: Left Arm, Cuff Size: Normal)   Pulse 89   Ht 5\' 5"  (1.651 m)   Wt 109 lb 3.2 oz (49.5 kg)   SpO2 93%   BMI 18.17 kg/m   History of present illness [name] is a 61 y.o. female smoker with abnormal CT chest.  I last saw her in  September 2010.  She was evaluated for COPD and pleural effusion then.  She was seen several years ago by Dr. Marin Olp for mediastinal LAN.  She developed a pneumonia a few weeks ago.  She had cough with green sputum, and fever up to 102F.  She was tx with levaquin and improved some.    Prior to  this she was noticing more trouble with cough with clear to yellow sputum.  She runs a low grade temp of 99 to 100F on a consistent basis.  She has noticed trouble keeping her weight up.  She will get wheeze sometimes also, and notices trouble with allergies especially in the Spring.  She still smokes 1 ppd.  She denies headaches, skin rash, gland swelling, sweats, abdominal pain, or hemoptysis.  No recent sick exposures, animal exposures, or travel history.  She uses albuterol several times per week.  She is not as consistent with using advair or singulair.  She has started using astelin and zyrtec in preparation for Spring allergy season   Physical exam  General - pleasant, thin ENT - No sinus tenderness, no oral exudate, no LAN, no thyromegaly, TM clear, pupils equal/reactive, pale mucosa Cardiac - s1s2 regular, no murmur, pulses symmetric Chest - coarse breath sounds, no wheeze Back - No focal tenderness Abd - Soft, non-tender, no organomegaly, + bowel sounds Ext - No edema Neuro - Normal strength, cranial nerves intact Skin - No rashes Psych - Normal mood, and behavior   CMP Latest Ref Rng & Units 04/18/2016 11/16/2013 06/01/2013  Glucose 70 - 99 mg/dL 147(H) 139(H) 102(H)  BUN 6 - 23 mg/dL 10 9 7   Creatinine 0.40 - 1.20 mg/dL 0.72 0.7 0.64  Sodium 135 - 145 mEq/L 139 137 138  Potassium 3.5 - 5.1 mEq/L 4.1 4.1 4.1  Chloride 96 - 112 mEq/L 103 99 101  CO2 19 - 32 mEq/L 31 30 27   Calcium 8.4 - 10.5 mg/dL 9.1 9.4 9.8  Total Protein 6.0 - 8.3 g/dL - - 6.9  Total Bilirubin 0.2 - 1.2 mg/dL - - 0.5  Alkaline Phos 39 - 117 U/L - - 80  AST 0 - 37 U/L - - 18  ALT 0 - 35 U/L - - <8     CBC Latest Ref Rng & Units 11/16/2013 06/01/2013 05/04/2013  WBC 4.0 - 10.5 K/uL 12.0(H) 11.5(H) 4.8  Hemoglobin 12.0 - 15.0 g/dL 15.2(H) 14.6 15.3  Hematocrit 36.0 - 46.0 % 46.7(H) 44.9 47.5(H)  Platelets 150.0 - 400.0 K/uL 213.0 199 165    Discussion She has persistent symptoms of cough,  sputum, low grade temperature.  She continues to smoke cigarettes.  She has history of COPD.  Recent imaging studies are suggestive of atypical infection such as mycobacterium avium intracellulare.   Assessment/plan  Abnormal CT chest with concern for MAI. - will arrange for sputum culture and sputum for AFB - CBC with Diff, CMET, Vit D  COPD with chronic bronchitis. - discussed different roles of her inhalers - continue advair and albuterol for now - repeat PFT  Tobacco abuse. - had detailed discussion about smoking cessation options - will have her try bupropion >> dosing regimen, setting quit date, and side effects to monitor for  Allergic rhinitis. - continue astelin, zyrtec, singulair   Patient Instructions  Bupropion 150 mg pill >> 1 pill daily for first 3 days, then 1 pill twice daily.  Set quit date to stop smoking 1 week after starting bupropion  Will arrange for sputum  culture and sputum for AFB  Will arrange for pulmonary function testing  Follow up in 4 weeks with Dr. Halford Chessman or Nurse Practitioner    Chesley Mires, MD [place] [facility] Pager:  [phone] 06/26/2016, 11:34 AM

## 2016-06-26 NOTE — Progress Notes (Signed)
   Subjective:    [name], female    DOB: [birth year], 61 y.o.   MRN: [id number]  HPI    Review of Systems  Constitutional: Negative for fever and unexpected weight change.  HENT: Negative for congestion, dental problem, ear pain, nosebleeds, postnasal drip, rhinorrhea, sinus pressure, sneezing, sore throat and trouble swallowing.   Eyes: Negative for redness and itching.  Respiratory: Positive for cough. Negative for chest tightness, shortness of breath and wheezing.   Cardiovascular: Negative for palpitations and leg swelling.  Gastrointestinal: Negative for nausea and vomiting.  Genitourinary: Negative for dysuria.  Musculoskeletal: Negative for joint swelling.       RA, joint stiffness  Skin: Negative for rash.  Neurological: Negative for headaches.  Hematological: Does not bruise/bleed easily.  Psychiatric/Behavioral: Negative for dysphoric mood. The patient is not nervous/anxious.        Objective:   Physical Exam        Assessment & Plan:

## 2016-06-26 NOTE — Patient Instructions (Signed)
Bupropion 150 mg pill >> 1 pill daily for first 3 days, then 1 pill twice daily.  Set quit date to stop smoking 1 week after starting bupropion  Will arrange for sputum culture and sputum for AFB  Will arrange for pulmonary function testing  Follow up in 4 weeks with Dr. Halford Chessman or Nurse Practitioner

## 2016-06-27 ENCOUNTER — Other Ambulatory Visit: Payer: BLUE CROSS/BLUE SHIELD

## 2016-06-27 ENCOUNTER — Telehealth: Payer: Self-pay | Admitting: Pulmonary Disease

## 2016-06-27 ENCOUNTER — Telehealth: Payer: Self-pay | Admitting: Family Medicine

## 2016-06-27 DIAGNOSIS — J189 Pneumonia, unspecified organism: Secondary | ICD-10-CM

## 2016-06-27 DIAGNOSIS — A310 Pulmonary mycobacterial infection: Secondary | ICD-10-CM | POA: Diagnosis not present

## 2016-06-27 NOTE — Telephone Encounter (Signed)
Results have been explained to patient, pt expressed understanding. Nothing further needed.  

## 2016-06-27 NOTE — Telephone Encounter (Signed)
CMP Latest Ref Rng & Units 06/26/2016 04/18/2016 11/16/2013  Glucose 70 - 99 mg/dL 100(H) 147(H) 139(H)  BUN 6 - 23 mg/dL 12 10 9   Creatinine 0.40 - 1.20 mg/dL 0.87 0.72 0.7  Sodium 135 - 145 mEq/L 138 139 137  Potassium 3.5 - 5.1 mEq/L 4.6 4.1 4.1  Chloride 96 - 112 mEq/L 104 103 99  CO2 19 - 32 mEq/L 31 31 30   Calcium 8.4 - 10.5 mg/dL 9.5 9.1 9.4  Total Protein 6.0 - 8.3 g/dL 7.3 - -  Total Bilirubin 0.2 - 1.2 mg/dL 0.6 - -  Alkaline Phos 39 - 117 U/L 83 - -  AST 0 - 37 U/L 32 - -  ALT 0 - 35 U/L 21 - -    CBC Latest Ref Rng & Units 06/26/2016 11/16/2013 06/01/2013  WBC 4.0 - 10.5 K/uL 11.9(H) 12.0(H) 11.5(H)  Hemoglobin 12.0 - 15.0 g/dL 15.7(H) 15.2(H) 14.6  Hematocrit 36.0 - 46.0 % 48.0(H) 46.7(H) 44.9  Platelets 150.0 - 400.0 K/uL 275.0 213.0 199     Will have my nurse inform pt that lab tests were okay.

## 2016-06-27 NOTE — Telephone Encounter (Signed)
Repeat cxr ordered to be completed week of 06/29/2016

## 2016-06-29 LAB — RESPIRATORY CULTURE OR RESPIRATORY AND SPUTUM CULTURE

## 2016-06-29 LAB — VITAMIN D 1,25 DIHYDROXY
Vitamin D 1, 25 (OH)2 Total: 48 pg/mL (ref 18–72)
Vitamin D2 1, 25 (OH)2: <8 pg/mL
Vitamin D3 1, 25 (OH)2: 48 pg/mL

## 2016-06-30 ENCOUNTER — Telehealth: Payer: Self-pay | Admitting: Family Medicine

## 2016-06-30 ENCOUNTER — Ambulatory Visit (HOSPITAL_BASED_OUTPATIENT_CLINIC_OR_DEPARTMENT_OTHER)
Admission: RE | Admit: 2016-06-30 | Discharge: 2016-06-30 | Disposition: A | Discharge status: Home / Self Care | Payer: BLUE CROSS/BLUE SHIELD | Source: Ambulatory Visit | Attending: Family Medicine | Admitting: Family Medicine

## 2016-06-30 DIAGNOSIS — J189 Pneumonia, unspecified organism: Secondary | ICD-10-CM | POA: Diagnosis not present

## 2016-06-30 DIAGNOSIS — Z8701 Personal history of pneumonia (recurrent): Secondary | ICD-10-CM | POA: Diagnosis not present

## 2016-06-30 DIAGNOSIS — R918 Other nonspecific abnormal finding of lung field: Secondary | ICD-10-CM | POA: Diagnosis not present

## 2016-06-30 DIAGNOSIS — Z09 Encounter for follow-up examination after completed treatment for conditions other than malignant neoplasm: Secondary | ICD-10-CM | POA: Diagnosis not present

## 2016-06-30 MED ORDER — LEVOFLOXACIN 750 MG PO TABS: 750.0000 mg | ORAL_TABLET | Freq: Every day | ORAL | 0 refills | Status: DC

## 2016-06-30 MED ORDER — PREDNISONE 50 MG PO TABS: 50.0000 mg | ORAL_TABLET | Freq: Every day | ORAL | 0 refills | Status: DC

## 2016-06-30 NOTE — Telephone Encounter (Signed)
Spoke with patient reviewed xray results . Patient states she feels bad still running fever and has some pain left rib area. Please advise

## 2016-06-30 NOTE — Telephone Encounter (Signed)
Spoke with patient reviewed information and instructions. Patient verbalized understanding. 

## 2016-06-30 NOTE — Telephone Encounter (Signed)
Please call patient and see how she is feeling. Her chest x-ray showed a "partial "clearing of the pneumonia. If she is still having fevers, fatigue or not feeling well and consider extending antibiotics.

## 2016-06-30 NOTE — Telephone Encounter (Signed)
Since xray did show some improvement, but pt reporting fever and the start of symptoms restarting, will prescribe extended course of Levaquin. If not completely resolved after course, she should call pulm and make them aware. Prednisone burst also prescribed.  - F/U 2-4 weeks if not resolved.

## 2016-07-02 ENCOUNTER — Other Ambulatory Visit: Payer: Self-pay | Admitting: *Deleted

## 2016-07-02 MED ORDER — ALBUTEROL SULFATE HFA 108 (90 BASE) MCG/ACT IN AERS: INHALATION_SPRAY | RESPIRATORY_TRACT | 3 refills | Status: DC

## 2016-07-03 ENCOUNTER — Telehealth: Payer: Self-pay | Admitting: Pulmonary Disease

## 2016-07-03 NOTE — Telephone Encounter (Signed)
Sputum culture 06/27/16 >> Moraxella catarrhalis  Left message on pt's voice mail detailing results.  She was started on levaquin by PCP on 06/30/16.  Advised this is good antibiotic for Moraxella, and that she can call back with questions.

## 2016-07-04 ENCOUNTER — Encounter: Payer: Self-pay | Admitting: Family Medicine

## 2016-07-11 ENCOUNTER — Encounter: Payer: Self-pay | Admitting: Pulmonary Disease

## 2016-07-11 MED ORDER — AZITHROMYCIN 250 MG PO TABS: ORAL_TABLET | ORAL | 0 refills | Status: AC

## 2016-07-11 NOTE — Telephone Encounter (Signed)
[facility] please advise. Thanks! 

## 2016-07-11 NOTE — Telephone Encounter (Signed)
Send script for zpak. 

## 2016-07-17 ENCOUNTER — Encounter: Payer: Self-pay | Admitting: Family Medicine

## 2016-07-17 ENCOUNTER — Telehealth: Payer: Self-pay | Admitting: Pulmonary Disease

## 2016-07-18 MED ORDER — CEFDINIR 300 MG PO CAPS: 300.0000 mg | ORAL_CAPSULE | Freq: Two times a day (BID) | ORAL | 0 refills | Status: DC

## 2016-07-18 NOTE — Telephone Encounter (Signed)
Spoke with pt, c/o continual fever(99-101), sob, prod cough with pale yellow mucus, fatigue, chills.  Denies chest pain.    Pt was given zpak last week which helped symptoms minimally.  Requesting further recs.  Pt would also like results of sputum culture- results available in Epic.    [facility].    VS please advise on further recs.  Thanks!

## 2016-07-18 NOTE — Telephone Encounter (Signed)
Patient returning call - she can be reached at [phone] -pr

## 2016-07-18 NOTE — Telephone Encounter (Signed)
This message was not routed to triage upon 07/17/2016 intake  lmtcb X1 for pt.

## 2016-07-18 NOTE — Telephone Encounter (Signed)
Message will be routed to DOD as VS is unavailable.

## 2016-07-18 NOTE — Telephone Encounter (Signed)
rx called in to preferred pharmacy.   lmtcb X1 for pt to make aware.

## 2016-07-18 NOTE — Telephone Encounter (Signed)
omnicef 300mg bid x 10 d

## 2016-07-21 NOTE — Telephone Encounter (Signed)
Spoke with pt, states she did pick up rx this weekend and has noted improvement in symptoms.  Nothing further needed.

## 2016-07-23 DIAGNOSIS — L900 Lichen sclerosus et atrophicus: Secondary | ICD-10-CM | POA: Diagnosis not present

## 2016-07-23 DIAGNOSIS — C44622 Squamous cell carcinoma of skin of right upper limb, including shoulder: Secondary | ICD-10-CM | POA: Diagnosis not present

## 2016-07-23 DIAGNOSIS — D0471 Carcinoma in situ of skin of right lower limb, including hip: Secondary | ICD-10-CM | POA: Diagnosis not present

## 2016-07-23 DIAGNOSIS — C44629 Squamous cell carcinoma of skin of left upper limb, including shoulder: Secondary | ICD-10-CM | POA: Diagnosis not present

## 2016-07-23 DIAGNOSIS — C44519 Basal cell carcinoma of skin of other part of trunk: Secondary | ICD-10-CM | POA: Diagnosis not present

## 2016-07-23 DIAGNOSIS — D485 Neoplasm of uncertain behavior of skin: Secondary | ICD-10-CM | POA: Diagnosis not present

## 2016-07-23 DIAGNOSIS — D044 Carcinoma in situ of skin of scalp and neck: Secondary | ICD-10-CM | POA: Diagnosis not present

## 2016-07-23 DIAGNOSIS — C44529 Squamous cell carcinoma of skin of other part of trunk: Secondary | ICD-10-CM | POA: Diagnosis not present

## 2016-08-06 ENCOUNTER — Ambulatory Visit: Payer: BLUE CROSS/BLUE SHIELD | Admitting: Adult Health

## 2016-08-11 ENCOUNTER — Other Ambulatory Visit: Payer: Self-pay

## 2016-08-11 MED ORDER — FLUTICASONE-SALMETEROL 230-21 MCG/ACT IN AERO: 2.0000 | INHALATION_SPRAY | Freq: Two times a day (BID) | RESPIRATORY_TRACT | 12 refills | Status: DC

## 2016-08-14 LAB — AFB CULTURE WITH SMEAR (NOT AT ARMC)

## 2016-08-25 ENCOUNTER — Ambulatory Visit (INDEPENDENT_AMBULATORY_CARE_PROVIDER_SITE_OTHER)
Admission: RE | Admit: 2016-08-25 | Discharge: 2016-08-25 | Disposition: A | Discharge status: Home / Self Care | Payer: BLUE CROSS/BLUE SHIELD | Source: Ambulatory Visit | Attending: Adult Health | Admitting: Adult Health

## 2016-08-25 ENCOUNTER — Ambulatory Visit (INDEPENDENT_AMBULATORY_CARE_PROVIDER_SITE_OTHER): Payer: BLUE CROSS/BLUE SHIELD | Admitting: Adult Health

## 2016-08-25 ENCOUNTER — Ambulatory Visit (INDEPENDENT_AMBULATORY_CARE_PROVIDER_SITE_OTHER): Payer: BLUE CROSS/BLUE SHIELD | Admitting: Pulmonary Disease

## 2016-08-25 ENCOUNTER — Encounter: Payer: Self-pay | Admitting: Adult Health

## 2016-08-25 VITALS — BP 144/84 | HR 77 | Ht 64.75 in | Wt 108.0 lb

## 2016-08-25 DIAGNOSIS — J439 Emphysema, unspecified: Secondary | ICD-10-CM | POA: Diagnosis not present

## 2016-08-25 DIAGNOSIS — F17200 Nicotine dependence, unspecified, uncomplicated: Secondary | ICD-10-CM

## 2016-08-25 DIAGNOSIS — J189 Pneumonia, unspecified organism: Secondary | ICD-10-CM

## 2016-08-25 DIAGNOSIS — A310 Pulmonary mycobacterial infection: Secondary | ICD-10-CM

## 2016-08-25 DIAGNOSIS — R918 Other nonspecific abnormal finding of lung field: Secondary | ICD-10-CM

## 2016-08-25 DIAGNOSIS — J411 Mucopurulent chronic bronchitis: Secondary | ICD-10-CM

## 2016-08-25 DIAGNOSIS — J181 Lobar pneumonia, unspecified organism: Secondary | ICD-10-CM

## 2016-08-25 LAB — PULMONARY FUNCTION TEST
DL/VA % pred: 61 %
DL/VA: 3 ml/min/mmHg/L
DLCO COR % PRED: 51 %
DLCO UNC % PRED: 52 %
DLCO UNC: 13.36 ml/min/mmHg
DLCO cor: 13.01 ml/min/mmHg
FEF 25-75 POST: 0.77 L/s
FEF 25-75 PRE: 0.65 L/s
FEF2575-%Change-Post: 18 %
FEF2575-%PRED-POST: 32 %
FEF2575-%PRED-PRE: 27 %
FEV1-%CHANGE-POST: 6 %
FEV1-%PRED-POST: 59 %
FEV1-%Pred-Pre: 55 %
FEV1-POST: 1.55 L
FEV1-Pre: 1.45 L
FEV1FVC-%Change-Post: 3 %
FEV1FVC-%PRED-PRE: 70 %
FEV6-%CHANGE-POST: 4 %
FEV6-%PRED-POST: 81 %
FEV6-%Pred-Pre: 78 %
FEV6-PRE: 2.55 L
FEV6-Post: 2.67 L
FEV6FVC-%CHANGE-POST: 1 %
FEV6FVC-%Pred-Post: 102 %
FEV6FVC-%Pred-Pre: 101 %
FVC-%Change-Post: 3 %
FVC-%Pred-Post: 80 %
FVC-%Pred-Pre: 77 %
FVC-Post: 2.72 L
FVC-Pre: 2.63 L
POST FEV1/FVC RATIO: 57 %
PRE FEV1/FVC RATIO: 55 %
Post FEV6/FVC ratio: 98 %
Pre FEV6/FVC Ratio: 97 %
RV % pred: 125 %
RV: 2.56 L
TLC % PRED: 103 %
TLC: 5.33 L

## 2016-08-25 MED ORDER — GLYCOPYRROLATE-FORMOTEROL 9-4.8 MCG/ACT IN AERO: 2.0000 | INHALATION_SPRAY | Freq: Two times a day (BID) | RESPIRATORY_TRACT | 5 refills | Status: DC

## 2016-08-25 MED ORDER — GLYCOPYRROLATE-FORMOTEROL 9-4.8 MCG/ACT IN AERO: 2.0000 | INHALATION_SPRAY | Freq: Two times a day (BID) | RESPIRATORY_TRACT | 0 refills | Status: AC

## 2016-08-25 NOTE — Addendum Note (Signed)
Addended by: Parke Poisson E on: 08/25/2016 04:24 PM   Modules accepted: Orders

## 2016-08-25 NOTE — Addendum Note (Signed)
Addended by: Parke Poisson E on: 08/25/2016 03:58 PM   Modules accepted: Orders

## 2016-08-25 NOTE — Progress Notes (Signed)
PFT done today. 

## 2016-08-25 NOTE — Assessment & Plan Note (Signed)
CT chest 04/2016 w/ nodularity in lung bases . ? Related to PNA/Moraxella+ sputum /bronchitis /pna.  Concern was for possible MAI . Sputum neb for AFB .  difficut to determine . She is a active smoker .  For now repeat CXR today  Will set her up for CT chest  prior to return in 2 months  _that will be a 6 month serial follow up .

## 2016-08-25 NOTE — Patient Instructions (Signed)
Change Advair to BEVESPI 2 puffs Twice daily  , rinse after inhalers .  Work on not smoking .  Chest xray today.  Follow up Dr. Halford Chessman  In 2 to 3 months and As needed

## 2016-08-25 NOTE — Assessment & Plan Note (Signed)
Moderate COPD w/ signiifcant decline since 2010 in active smoker Smoking cessation encouraged.   Plan  Patient Instructions  Change Advair to BEVESPI 2 puffs Twice daily  , rinse after inhalers .  Work on not smoking .  Chest xray today.  Follow up Dr. Halford Chessman  In 2 to 3 months and As needed

## 2016-08-25 NOTE — Assessment & Plan Note (Signed)
Smoking cessation  

## 2016-08-25 NOTE — Assessment & Plan Note (Signed)
LLL PNA w/ +Moraxella in sputum  She is clinically improved after abx w/ Levaquin and Omnicef .  Check cxr today .

## 2016-08-25 NOTE — Progress Notes (Signed)
@Patient  ID: [name], female    DOB: [birth year], 61 y.o.   MRN: [id number]  Chief Complaint  Patient presents with  . Follow-up    Referring provider: Ma Hillock, DO  HPI: 61 year old female seen for pulmonary consult February 2018 for abnormal CT chest.  She has known COPD.   TEST  Pulmonary tests PFT 12/15/08 FEV1 2.20(90%), FVC 3.68(113%), FEV1% 60, TLC 6.54(132%), DLCO 75%, no BD response CT chest 04/21/16 >> borderline LAN, nodules in lung bases with tree in bud  08/25/2016 follow-up COPD and abnormal CT chest  Patient returns for two-month follow-up. Patient was seen last visit for a pulmonary consult. Patient had an abnormal CT chest. There was concern for MAI.[name]  AFB  Sputum culture done in February returned negative for AFB.[name] Sputum culture positive for MORAXELLA (BRANHAMELLA) CATARRHALIS . She was treated with Levaquin and Omnicef .  Since finishing abx she is feeling.  CXR done 06/2016 showed a Left basilar PNA. Repeat CXR on 2/26 showed partial clearing in left bse.   Remains on Advair Twice daily  . She has a high deductible so cost her $450 /month.  Continues to smoke. Advised on cessation . She is trying to quit. Has begun Bupropion. .  Pulmonary function test done today. Does moderate COPD with an FEV1 at 59%, ratio 57, FVC 80%, no significant bronchodilator response. DLCO is 52% This is  down significantly since 2010 where she had an FEV1 at 88% and a DLCO at 75%    No Known Allergies  Immunization History  Administered Date(s) Administered  . Influenza Split 04/09/2011  . Influenza Whole 02/02/2009  . Influenza,inj,Quad PF,36+ Mos 01/26/2014  . Influenza-Unspecified 02/02/2013, 02/24/2015, 04/03/2016    Past Medical History:  Diagnosis Date  . Basal cell carcinoma   . History of acute bronchitis 03/24/2011  . Lymphadenopathy, abdominal 03/2013   Noted on abd/pelv CT done to eval slow-to-resolve pyelo  . Meniere disorder 01/23/2012  .  Osteoporosis 12/2014   Dr. Nori Riis, her GYN, dx'd her  . Recurrent pyelonephritis   . Seasonal allergic rhinitis     Tobacco History: History  Smoking Status  . Current Every Day Smoker  . Packs/day: 1.00  . Years: 29.00  . Types: Cigarettes  . Start date: 06/01/1978  Smokeless Tobacco  . Never Used   Ready to quit: No Counseling given: Yes   Outpatient Encounter Prescriptions as of 08/25/2016  Medication Sig  . albuterol (PROVENTIL) (2.5 MG/3ML) 0.083% nebulizer solution Take 2.5 mg by nebulization every 6 (six) hours as needed for wheezing or shortness of breath.  [name] albuterol (VENTOLIN HFA) 108 (90 Base) MCG/ACT inhaler INHALE 2 PUFFS INTO THE LUNGS EVERY 6 (SIX) HOURS AS NEEDED FOR WHEEZING.  [name] azelastine (ASTELIN) 0.1 % nasal spray Place 1 spray into both nostrils 2 (two) times daily. Use in each nostril as directed  . buPROPion (ZYBAN) 150 MG 12 hr tablet Take 1 tablet (150 mg total) by mouth 2 (two) times daily.  . fluticasone-salmeterol (ADVAIR HFA) 230-21 MCG/ACT inhaler Inhale 2 puffs into the lungs 2 (two) times daily.  . montelukast (SINGULAIR) 10 MG tablet Take 1 tablet (10 mg total) by mouth at bedtime.  . Multiple Vitamin (MULTIVITAMIN) tablet Take 1 tablet by mouth daily.  . [DISCONTINUED] cefdinir (OMNICEF) 300 MG capsule Take 1 capsule (300 mg total) by mouth 2 (two) times daily. (Patient not taking: Reported on 08/25/2016)  . [DISCONTINUED] levofloxacin (LEVAQUIN) 750 MG tablet Take 1 tablet (750  mg total) by mouth daily. (Patient not taking: Reported on 08/25/2016)  . [DISCONTINUED] predniSONE (DELTASONE) 50 MG tablet Take 1 tablet (50 mg total) by mouth daily with breakfast. (Patient not taking: Reported on 08/25/2016)   No facility-administered encounter medications on file as of 08/25/2016.      Review of Systems  Constitutional:   No  weight loss, night sweats,  Fevers, chills, +fatigue, or  lassitude.  HEENT:   No headaches,  Difficulty swallowing,   Tooth/dental problems, or  Sore throat,                No sneezing, itching, ear ache, nasal congestion, post nasal drip,   CV:  No chest pain,  Orthopnea, PND, swelling in lower extremities, anasarca, dizziness, palpitations, syncope.   GI  No heartburn, indigestion, abdominal pain, nausea, vomiting, diarrhea, change in bowel habits, loss of appetite, bloody stools.   Resp: No shortness of breath with exertion or at rest.  No excess mucus, no productive cough,  No non-productive cough,  No coughing up of blood.  No change in color of mucus.  No wheezing.  No chest wall deformity  Skin: no rash or lesions.  GU: no dysuria, change in color of urine, no urgency or frequency.  No flank pain, no hematuria   MS:  No joint pain or swelling.  No decreased range of motion.  No back pain.    Physical Exam  BP (!) 144/84 (BP Location: Left Arm, Cuff Size: Normal)   Pulse 77   Ht 5' 4.75" (1.645 m)   Wt 108 lb (49 kg)   SpO2 94%   BMI 18.11 kg/m   GEN: A/Ox3; pleasant , NAD thin    HEENT:  [place]/AT,  EACs-clear, TMs-wnl, NOSE-clear, THROAT-clear, no lesions, no postnasal drip or exudate noted.   NECK:  Supple w/ fair ROM; no JVD; normal carotid impulses w/o bruits; no thyromegaly or nodules palpated; no lymphadenopathy.    RESP  diminshed BS in bases , no  accessory muscle use, no dullness to percussion  CARD:  RRR, no m/r/g, no peripheral edema, pulses intact, no cyanosis or clubbing.  GI:   Soft & nt; nml bowel sounds; no organomegaly or masses detected.   Musco: Warm bil, no deformities or joint swelling noted.   Neuro: alert, no focal deficits noted.    Skin: Warm, no lesions or rashes     BNP No results found for: BNP  ProBNP No results found for: PROBNP  Imaging: No results found.   Assessment & Plan:   No problem-specific Assessment & Plan notes found for this encounter.     Rexene Edison, NP 08/25/2016

## 2016-08-26 ENCOUNTER — Other Ambulatory Visit: Payer: Self-pay

## 2016-08-26 DIAGNOSIS — R918 Other nonspecific abnormal finding of lung field: Secondary | ICD-10-CM

## 2016-08-27 NOTE — Progress Notes (Signed)
Dg Chest 2 View  Result Date: 08/25/2016 CLINICAL DATA:  61 year old female with a history of prior pneumonia. EXAM: CHEST  2 VIEW COMPARISON:  06/30/2016, 06/11/2016 FINDINGS: Cardiomediastinal silhouette unchanged in size and contour. Calcifications of the aortic arch. Stigmata of emphysema, with increased retrosternal airspace, flattened hemidiaphragms, increased AP diameter, and hyperinflation on the AP view. Compared to the plain film of 06/11/2016 and 06/30/2016, there is near complete resolution of airspace disease at the posterior base, left side. No pleural effusion. No pneumothorax. No confluent airspace disease. No displaced fracture. IMPRESSION: Resolution of previous airspace disease at the left base. Emphysema. Aortic atherosclerosis. Electronically Signed   By: Corrie Mckusick D.O.   On: 08/25/2016 12:42    I have reviewed and agree with assessment/plan.  Chesley Mires, MD [facility] Pulmonary/Critical Care 08/27/2016, 8:22 AM Pager:  [phone]

## 2016-09-12 ENCOUNTER — Other Ambulatory Visit: Payer: Self-pay | Admitting: *Deleted

## 2016-09-12 MED ORDER — MONTELUKAST SODIUM 10 MG PO TABS: 10.0000 mg | ORAL_TABLET | Freq: Every day | ORAL | 5 refills | Status: DC

## 2016-09-23 DIAGNOSIS — L570 Actinic keratosis: Secondary | ICD-10-CM | POA: Diagnosis not present

## 2016-09-25 ENCOUNTER — Other Ambulatory Visit: Payer: Self-pay | Admitting: *Deleted

## 2016-09-25 MED ORDER — AZELASTINE HCL 0.1 % NA SOLN: 1.0000 | Freq: Two times a day (BID) | NASAL | 5 refills | Status: DC

## 2016-10-11 ENCOUNTER — Telehealth: Payer: BLUE CROSS/BLUE SHIELD | Admitting: Nurse Practitioner

## 2016-10-11 DIAGNOSIS — R05 Cough: Secondary | ICD-10-CM

## 2016-10-11 DIAGNOSIS — R059 Cough, unspecified: Secondary | ICD-10-CM

## 2016-10-11 MED ORDER — AZITHROMYCIN 250 MG PO TABS: ORAL_TABLET | ORAL | 0 refills | Status: DC

## 2016-10-11 MED ORDER — BENZONATATE 100 MG PO CAPS: 100.0000 mg | ORAL_CAPSULE | Freq: Three times a day (TID) | ORAL | 0 refills | Status: DC | PRN

## 2016-10-11 NOTE — Progress Notes (Signed)

## 2016-10-14 ENCOUNTER — Telehealth: Payer: Self-pay | Admitting: Pulmonary Disease

## 2016-10-14 ENCOUNTER — Other Ambulatory Visit: Payer: Self-pay | Admitting: Pulmonary Disease

## 2016-10-14 DIAGNOSIS — R918 Other nonspecific abnormal finding of lung field: Secondary | ICD-10-CM

## 2016-10-14 NOTE — Telephone Encounter (Signed)
lmtcb x1 for pt. 

## 2016-10-14 NOTE — Telephone Encounter (Signed)
This was in my Ct recalls I had not called the patient as of yet due to needing labs these have been put in since then once the patient calls back to verify where she wants this scheduled I will take care of getting her scheduled

## 2016-10-14 NOTE — Telephone Encounter (Signed)
BMET ordered today at 4:15pm by Desmond Dike, [place] Message was left for patient to return call.

## 2016-10-14 NOTE — Telephone Encounter (Signed)
-----   Message from Harland German sent at 10/13/2016 11:06 AM EDT ----- Regarding: Labs Hey Florena Kozma this pt needs labs put in for her ct on 10/20/16 she needs to have them this week so they will have them for the CT. Thanks

## 2016-10-15 NOTE — Telephone Encounter (Signed)
Pt aware of labwork needed.  States that she received a call from insurance stating that for her to have hor CT at [facility] she is going to pay $600 more than having it at [facility]. Will send to [facility] to contact that patient to get this rescheduled. Pt requesting that [place] be cancelled.  [facility] please advise. Thanks.

## 2016-10-15 NOTE — Telephone Encounter (Signed)
Called and the patient left message she called back wanted me to fax the order I have done this and got the confirmation from [place] and left her a vm to call them to schedule

## 2016-10-15 NOTE — Telephone Encounter (Signed)
Ct has been cancelled as of yesterday I have called the patient again to find out which place she wants this scheduled

## 2016-10-16 ENCOUNTER — Telehealth: Payer: Self-pay | Admitting: Pulmonary Disease

## 2016-10-16 DIAGNOSIS — R9389 Abnormal findings on diagnostic imaging of other specified body structures: Secondary | ICD-10-CM

## 2016-10-16 NOTE — Telephone Encounter (Signed)
Closed in error, sorry  Forwarding back to triage

## 2016-10-16 NOTE — Telephone Encounter (Signed)
New telephone encounter was created by front staff. Will close this message, nothing further is needed

## 2016-10-16 NOTE — Telephone Encounter (Signed)
Spoke with Mariam at [facility] and they stated they received the order from [facility] yesterday, but they are having trouble seeing the order because the original order stated for [facility]. If you see phone note from 10/14/16 pt called and requested this to go to [facility] instead.  I placed the order again, placed it under Dr. Halford Chessman again so that the results will go to him and since the pt is following up with him. Spoke with Vallarie Mare on the phone and she is aware that the order was placed again and will get the order to [facility].  Nothing further is needed.     Notes recorded by Melvenia Needles, NP on 08/25/2016 at 1:36 PM EDT CXR is improved with near resolution of previous pna  Would get CT chest w/ contrast in 3 months -before follow up with Dr. Halford Chessman To look at prev abn areas and lymph nodes on CT chest .  Cont w/ ov recs

## 2016-10-16 NOTE — Telephone Encounter (Signed)
LMTCB for [facility]

## 2016-10-20 ENCOUNTER — Telehealth: Payer: Self-pay | Admitting: Pulmonary Disease

## 2016-10-20 ENCOUNTER — Inpatient Hospital Stay: Admission: RE | Admit: 2016-10-20 | Payer: BLUE CROSS/BLUE SHIELD | Source: Ambulatory Visit

## 2016-10-20 NOTE — Telephone Encounter (Signed)
Location has been changed on this pre-cert #[id number] to [facility] [address] [place]

## 2016-10-22 ENCOUNTER — Ambulatory Visit
Admission: RE | Admit: 2016-10-22 | Discharge: 2016-10-22 | Disposition: A | Discharge status: Home / Self Care | Payer: BLUE CROSS/BLUE SHIELD | Source: Ambulatory Visit | Attending: Pulmonary Disease | Admitting: Pulmonary Disease

## 2016-10-22 ENCOUNTER — Other Ambulatory Visit: Payer: BLUE CROSS/BLUE SHIELD

## 2016-10-22 DIAGNOSIS — R9389 Abnormal findings on diagnostic imaging of other specified body structures: Secondary | ICD-10-CM

## 2016-10-22 DIAGNOSIS — J439 Emphysema, unspecified: Secondary | ICD-10-CM | POA: Diagnosis not present

## 2016-11-11 ENCOUNTER — Ambulatory Visit: Payer: BLUE CROSS/BLUE SHIELD | Admitting: Pulmonary Disease

## 2016-11-13 ENCOUNTER — Other Ambulatory Visit: Payer: Self-pay | Admitting: Pulmonary Disease

## 2016-12-12 ENCOUNTER — Ambulatory Visit (INDEPENDENT_AMBULATORY_CARE_PROVIDER_SITE_OTHER): Payer: BLUE CROSS/BLUE SHIELD | Admitting: Pulmonary Disease

## 2016-12-12 ENCOUNTER — Encounter: Payer: Self-pay | Admitting: Pulmonary Disease

## 2016-12-12 VITALS — BP 118/78 | HR 78 | Ht 64.0 in | Wt 109.6 lb

## 2016-12-12 DIAGNOSIS — R911 Solitary pulmonary nodule: Secondary | ICD-10-CM | POA: Diagnosis not present

## 2016-12-12 DIAGNOSIS — Z720 Tobacco use: Secondary | ICD-10-CM

## 2016-12-12 DIAGNOSIS — J449 Chronic obstructive pulmonary disease, unspecified: Secondary | ICD-10-CM | POA: Diagnosis not present

## 2016-12-12 DIAGNOSIS — F17200 Nicotine dependence, unspecified, uncomplicated: Secondary | ICD-10-CM | POA: Diagnosis not present

## 2016-12-12 MED ORDER — BUDESONIDE-FORMOTEROL FUMARATE 160-4.5 MCG/ACT IN AERO: 2.0000 | INHALATION_SPRAY | Freq: Two times a day (BID) | RESPIRATORY_TRACT | 6 refills | Status: DC

## 2016-12-12 NOTE — Progress Notes (Signed)
Current Outpatient Prescriptions on File Prior to Visit  Medication Sig  . albuterol (PROVENTIL) (2.5 MG/3ML) 0.083% nebulizer solution Take 2.5 mg by nebulization every 6 (six) hours as needed for wheezing or shortness of breath.  [name] azelastine (ASTELIN) 0.1 % nasal spray Place 1 spray into both nostrils 2 (two) times daily. Use in each nostril as directed  . montelukast (SINGULAIR) 10 MG tablet Take 1 tablet (10 mg total) by mouth at bedtime.  . Multiple Vitamin (MULTIVITAMIN) tablet Take 1 tablet by mouth daily.  . VENTOLIN HFA 108 (90 Base) MCG/ACT inhaler INHALE 2 PUFFS INTO THE LUNGS EVERY 6 (SIX) HOURS AS NEEDED FOR WHEEZING.  [name] buPROPion (ZYBAN) 150 MG 12 hr tablet Take 1 tablet (150 mg total) by mouth 2 (two) times daily. (Patient not taking: Reported on 12/12/2016)   No current facility-administered medications on file prior to visit.      Chief Complaint  Patient presents with  . Follow-up    Pt states Bevespi worked okay - pt states that she could not tell much difference but she only had 2 samples to try. Pt states that her breathing overall has been okay. Pt reports having a chest cold last week with fever and chest congestion with thick yellow/green -- pt reports it is mostly resolved but is still having a few symptoms.     Pulmonary tests PFT 12/15/08 FEV1 2.20(90%), FVC 3.68(113%), FEV1% 60, TLC 6.54(132%), DLCO 75%, no BD response CT chest 04/21/16 >> borderline LAN, nodules in lung bases with tree in bud Sputum culture 06/27/16 >> Moraxella catarrhalis PFT 08/25/16 >> FEV1 1.55 (59%), FEV1% 57, TLC 5.33 (103%), DLCO 52% CT chest 10/22/16 >> moderate centrilobular emphysema, 1.2 cm nodule LUL, tree in bud, RLL BTX  Past medical history Meniere disease, Osteoporosis  Past surgical history, Family history, Social history, Allergies all reviewed.  Vital Signs BP 118/78 (BP Location: Left Arm, Cuff Size: Normal)   Pulse 78   Ht 5\' 4"  (1.626 m)   Wt 109 lb 9.6 oz (49.7 kg)    SpO2 95%   BMI 18.81 kg/m   History of Present Illness [name] is a 61 y.o. female smoker with COPD/emphysema, bronchiectasis, and lung nodules.  She is still smoking.  She hasn't gotten to a point that she is ready to quit.  She still has zyban script.  She is still getting cough with yellow to green sputum.  She had feverish feeling one week ago.  She denies hemoptysis.  She had discomfort in her left chest a week ago, but not since.  She denies skin rash, leg swelling, or gland swelling.  She denies sore throat.  Her weight has been steady.  She thought bevespi helped some, but was too expensive.   Physical Exam  General - pleasant Eyes - pupils reactive ENT - no sinus tenderness, no oral exudate, no LAN Cardiac - regular, no murmur Chest - no wheeze, rales Abd - soft, non tender Ext - no edema Skin - no rashes Neuro - normal strength Psych - normal mood    Assessment/Plan  Lung nodule. - will repeat CT chest w/o contrast for September 2018  COPD with chronic bronchitis and emphysema. - will have her try symbicort - continue singulair and prn albuterol  Tobacco abuse. - she will consider starting bupropion  Allergic rhinitis. - continue astelin, zyrtec, and singulair - might need further assessment with RAST and IgE   Patient Instructions  Symbicort two puffs twice daily and  rinse mouth after each use  Will schedule CT chest for September 2018  Follow up in 4 months    Chesley Mires, MD [place] Pulmonary/Critical Care/Sleep Pager:  [phone] 12/12/2016, 9:50 AM

## 2016-12-12 NOTE — Patient Instructions (Signed)
Symbicort two puffs twice daily and rinse mouth after each use  Will schedule CT chest for September 2018  Follow up in 4 months

## 2017-01-21 DIAGNOSIS — L814 Other melanin hyperpigmentation: Secondary | ICD-10-CM | POA: Diagnosis not present

## 2017-01-21 DIAGNOSIS — L570 Actinic keratosis: Secondary | ICD-10-CM | POA: Diagnosis not present

## 2017-01-21 DIAGNOSIS — Z85828 Personal history of other malignant neoplasm of skin: Secondary | ICD-10-CM | POA: Diagnosis not present

## 2017-01-21 DIAGNOSIS — D485 Neoplasm of uncertain behavior of skin: Secondary | ICD-10-CM | POA: Diagnosis not present

## 2017-01-21 DIAGNOSIS — D225 Melanocytic nevi of trunk: Secondary | ICD-10-CM | POA: Diagnosis not present

## 2017-01-21 DIAGNOSIS — C44729 Squamous cell carcinoma of skin of left lower limb, including hip: Secondary | ICD-10-CM | POA: Diagnosis not present

## 2017-01-21 DIAGNOSIS — L820 Inflamed seborrheic keratosis: Secondary | ICD-10-CM | POA: Diagnosis not present

## 2017-01-26 ENCOUNTER — Ambulatory Visit
Admission: RE | Admit: 2017-01-26 | Discharge: 2017-01-26 | Disposition: A | Discharge status: Home / Self Care | Payer: BLUE CROSS/BLUE SHIELD | Source: Ambulatory Visit | Attending: Pulmonary Disease | Admitting: Pulmonary Disease

## 2017-01-26 DIAGNOSIS — R911 Solitary pulmonary nodule: Secondary | ICD-10-CM | POA: Diagnosis not present

## 2017-01-30 ENCOUNTER — Telehealth: Payer: Self-pay | Admitting: Pulmonary Disease

## 2017-01-30 NOTE — Telephone Encounter (Signed)
CT chest 01/26/17 >> moderate centrilobular emphysema, mild b/l BTX with tree in bud, LUL nodule resolved, stable 7 mm RLL nodule since 2015   Please let her know that lung nodule in upper Lt lung on CT chest in June 2018 is now gone and no other new findings.  No change to current tx plan.

## 2017-01-30 NOTE — Telephone Encounter (Signed)
Spoke with patient to inform her of results and recommendations per SV. The patient verbalized understanding and denied any questions or concerns with the results at this time.   Patient would like to know if VS would prescribe her a new script for a spacer for her inhalers? Routing a message to VS today.

## 2017-02-01 NOTE — Telephone Encounter (Signed)
Okay to send order for spacer device.

## 2017-02-02 MED ORDER — AEROCHAMBER MV MISC: 2 refills | Status: DC

## 2017-02-02 NOTE — Telephone Encounter (Signed)
Per VS okay to place an order for the spacer for patient. LVM on machine for patient that we have placed order for the spacer today. No further calling is needed at this time.

## 2017-02-16 ENCOUNTER — Telehealth: Payer: Self-pay | Admitting: Pulmonary Disease

## 2017-02-16 MED ORDER — LEVOFLOXACIN 750 MG PO TABS: 750.0000 mg | ORAL_TABLET | Freq: Every day | ORAL | 0 refills | Status: DC

## 2017-02-16 NOTE — Telephone Encounter (Signed)
Please send script for levaquin 750 mg daily for 7 days.  She needs ROV if not improving.

## 2017-02-16 NOTE — Telephone Encounter (Signed)
Spoke with patient. She stated that she has had a productive cough, fever, and chest congestion since Friday night. The highest her fever has been was 101F. Productive cough with green phlegm. States she has been taking Mucinex, Vitamin C, and Astelin nasal spray with no relief.   [facility].   VS, please advise. Thanks!

## 2017-02-16 NOTE — Telephone Encounter (Signed)
Spoke with pt, aware of recs.  rx sent to preferred pharmacy.  Nothing further needed.  

## 2017-03-31 DIAGNOSIS — D0472 Carcinoma in situ of skin of left lower limb, including hip: Secondary | ICD-10-CM | POA: Diagnosis not present

## 2017-03-31 DIAGNOSIS — C44729 Squamous cell carcinoma of skin of left lower limb, including hip: Secondary | ICD-10-CM | POA: Diagnosis not present

## 2017-04-06 DIAGNOSIS — Z681 Body mass index (BMI) 19 or less, adult: Secondary | ICD-10-CM | POA: Diagnosis not present

## 2017-04-06 DIAGNOSIS — Z1231 Encounter for screening mammogram for malignant neoplasm of breast: Secondary | ICD-10-CM | POA: Diagnosis not present

## 2017-04-06 DIAGNOSIS — Z01419 Encounter for gynecological examination (general) (routine) without abnormal findings: Secondary | ICD-10-CM | POA: Diagnosis not present

## 2017-04-13 ENCOUNTER — Ambulatory Visit: Payer: BLUE CROSS/BLUE SHIELD | Admitting: Pulmonary Disease

## 2017-04-29 ENCOUNTER — Other Ambulatory Visit: Payer: Self-pay

## 2017-04-29 MED ORDER — ALBUTEROL SULFATE HFA 108 (90 BASE) MCG/ACT IN AERS: 2.0000 | INHALATION_SPRAY | Freq: Four times a day (QID) | RESPIRATORY_TRACT | 3 refills | Status: DC | PRN

## 2017-05-11 ENCOUNTER — Telehealth: Payer: Self-pay | Admitting: Pulmonary Disease

## 2017-05-11 MED ORDER — AMOXICILLIN-POT CLAVULANATE 875-125 MG PO TABS: 1.0000 | ORAL_TABLET | Freq: Two times a day (BID) | ORAL | 0 refills | Status: DC

## 2017-05-11 NOTE — Telephone Encounter (Signed)
Rx for Augmentin has been sent to preferred pharmacy. Pt is aware and voiced her understanding. Pt has pending apt for 05/13/17. Nothing further is needed.

## 2017-05-11 NOTE — Telephone Encounter (Signed)
Called and spoke with pt.  Pt states that she has be fighting with sinusitis since christmas, but feels it has now turned into bronchitis. Pt reports of temp of 101.4 this am, chills, prod cough with yellow to green mucus, head congestion & wheezing x2d. Denies sweats or body aches.  Pt taken mucinex 200mg  q6h & ventolin q4h with mild improvement.   VS please advise. Thanks.

## 2017-05-11 NOTE — Telephone Encounter (Signed)
Can send script for augmentin 875 one pill bid for 7 days.  She also is overdue for follow up.  Please schedule her for ROV with me or NP.

## 2017-05-13 ENCOUNTER — Encounter: Payer: Self-pay | Admitting: Pulmonary Disease

## 2017-05-13 ENCOUNTER — Ambulatory Visit (INDEPENDENT_AMBULATORY_CARE_PROVIDER_SITE_OTHER): Payer: BLUE CROSS/BLUE SHIELD | Admitting: Pulmonary Disease

## 2017-05-13 VITALS — BP 118/72 | HR 77 | Temp 98.8°F | Ht 65.0 in | Wt 111.2 lb

## 2017-05-13 DIAGNOSIS — J440 Chronic obstructive pulmonary disease with acute lower respiratory infection: Secondary | ICD-10-CM

## 2017-05-13 DIAGNOSIS — J449 Chronic obstructive pulmonary disease, unspecified: Secondary | ICD-10-CM

## 2017-05-13 DIAGNOSIS — Z720 Tobacco use: Secondary | ICD-10-CM

## 2017-05-13 DIAGNOSIS — J209 Acute bronchitis, unspecified: Secondary | ICD-10-CM | POA: Diagnosis not present

## 2017-05-13 DIAGNOSIS — F17200 Nicotine dependence, unspecified, uncomplicated: Secondary | ICD-10-CM

## 2017-05-13 MED ORDER — NICOTINE 14 MG/24HR TD PT24: 14.0000 mg | MEDICATED_PATCH | Freq: Every day | TRANSDERMAL | 0 refills | Status: DC

## 2017-05-13 NOTE — Patient Instructions (Signed)
Finish course of antibiotic  Nicotine patch daily  Call if not feeling better  Follow up in 6 months

## 2017-05-13 NOTE — Progress Notes (Signed)
[place] Pulmonary, Critical Care, and Sleep Medicine  Chief Complaint  Patient presents with  . Follow-up    recently sick per telephone note, cough with yellow mucus, still taking Symbicort and seems to help, she feels better after Augmentin    Vital signs: BP 118/72 (BP Location: Left Arm, Cuff Size: Normal)   Pulse 77   Temp 98.8 F (37.1 C) (Oral)   Ht 5\' 5"  (1.651 m)   Wt 111 lb 3.2 oz (50.4 kg)   SpO2 93%   BMI 18.50 kg/m   History of Present Illness: [name] is a 62 y.o. female smoker with COPD/emphysema, bronchiectasis, and lung nodules.  Several people at home were sick.  She got a cough and chest congestion.  She was also having sinus pressure.  She had fever.  She was bringing up yellow green sputum, and getting wheeze.  She started augmentin and has improved.  She still smokes cigarettes.  She has been using bupropion.  This has helped, but not enough.  Physical Exam:  General - pleasant Eyes - pupils reactive ENT - no sinus tenderness, no oral exudate, no LAN Cardiac - regular, no murmur Chest - no wheeze, rales Abd - soft, non tender Ext - no edema Skin - no rashes Neuro - normal strength Psych - normal mood  Assessment/Plan:  Acute bronchitis with sinusitis. - improving - complete augmentin course  COPD with chronic bronchitis and emphysema. - continue symbicort, singulair, and prn albuterol  Tobacco abuse. - continue bupropion - will add nicotine patch  Allergic rhinitis. - might need RAST, IgE, CBC with diff at next visit - continue astelin, singulair, prn zyrtec   Patient Instructions  Finish course of antibiotic  Nicotine patch daily  Call if not feeling better  Follow up in 6 months    Chesley Mires, MD [place] 05/13/2017, 12:59 PM Pager:  [phone]  Flow Sheet  Pulmonary tests: PFT 12/15/08 FEV1 2.20(90%), FVC 3.68(113%), FEV1% 60, TLC 6.54(132%), DLCO 75%, no BD response CT chest  04/21/16 >> borderline LAN, nodules in lung bases with tree in bud Sputum culture 06/27/16 >> Moraxella catarrhalis PFT 08/25/16 >> FEV1 1.55 (59%), FEV1% 57, TLC 5.33 (103%), DLCO 52% CT chest 10/22/16 >> moderate centrilobular emphysema, 1.2 cm nodule LUL, tree in bud, RLL BTX CT chest 01/26/17 >> moderate centrilobular emphysema, mild b/l BTX with tree in bud, LUL nodule resolved, stable 7 mm RLL nodule since 2015   Past Medical History: She  has a past medical history of Basal cell carcinoma, History of acute bronchitis (03/24/2011), Lymphadenopathy, abdominal (03/2013), Meniere disorder (01/23/2012), Osteoporosis (12/2014), Recurrent pyelonephritis, and Seasonal allergic rhinitis.  Past Surgical History: She  has a past surgical history that includes biopsy on skin cancer; Dilation and curettage of uterus (2009); and DEXA (12/2014).  Family History: Her family history includes Arthritis in her mother; Cancer in her father and maternal grandmother; Colon cancer (age of onset: 2) in her paternal aunt; Diabetes in her mother; Glaucoma in her mother; Heart attack in her paternal grandfather; Heart disease in her father and maternal grandmother; Hypertension in her father and mother; Irritable bowel syndrome in her mother; Kidney disease in her mother; Other in her paternal grandmother; Stroke in her maternal grandfather and maternal grandmother.  Social History: She  reports that she has been smoking cigarettes.  She started smoking about 38 years ago. She has a 29.00 pack-year smoking history. she has never used smokeless tobacco. She reports that she drinks about  4.2 oz of alcohol per week. She reports that she does not use drugs.  Medications: Allergies as of 05/13/2017   No Known Allergies     Medication List        Accurate as of 05/13/17 12:59 PM. Always use your most recent med list.          AEROCHAMBER MV inhaler Use as instructed   albuterol (2.5 MG/3ML) 0.083% nebulizer  solution Commonly known as:  PROVENTIL Take 2.5 mg by nebulization every 6 (six) hours as needed for wheezing or shortness of breath.   albuterol 108 (90 Base) MCG/ACT inhaler Commonly known as:  VENTOLIN HFA Inhale 2 puffs into the lungs every 6 (six) hours as needed for wheezing or shortness of breath.   amoxicillin-clavulanate 875-125 MG tablet Commonly known as:  AUGMENTIN Take 1 tablet by mouth 2 (two) times daily.   azelastine 0.1 % nasal spray Commonly known as:  ASTELIN Place 1 spray into both nostrils 2 (two) times daily. Use in each nostril as directed   budesonide-formoterol 160-4.5 MCG/ACT inhaler Commonly known as:  SYMBICORT Inhale 2 puffs into the lungs 2 (two) times daily.   buPROPion 150 MG 12 hr tablet Commonly known as:  ZYBAN Take 1 tablet (150 mg total) by mouth 2 (two) times daily.   montelukast 10 MG tablet Commonly known as:  SINGULAIR Take 1 tablet (10 mg total) by mouth at bedtime.   multivitamin tablet Take 1 tablet by mouth daily.   nicotine 14 mg/24hr patch Commonly known as:  NICODERM CQ - dosed in mg/24 hours Place 1 patch (14 mg total) onto the skin daily.

## 2017-05-18 ENCOUNTER — Telehealth: Payer: Self-pay | Admitting: Pulmonary Disease

## 2017-05-18 MED ORDER — AMOXICILLIN-POT CLAVULANATE 875-125 MG PO TABS: 1.0000 | ORAL_TABLET | Freq: Two times a day (BID) | ORAL | 0 refills | Status: DC

## 2017-05-18 NOTE — Telephone Encounter (Signed)
Can send script for augment 875 mg bid, #14 with no refills.

## 2017-05-18 NOTE — Telephone Encounter (Signed)
Pt aware of recs.  rx sent to preferred pharmacy.  Nothing further needed.  

## 2017-05-18 NOTE — Telephone Encounter (Signed)
Pt requesting a refill on augmentin. C/o improved but unresolved dyspnea, cough, chest tightness, yellow/white mucus production.  Denies fever, chills, body aches, sinus congestion.    Uses [facility] in [place].  VS please advise on refill.  Thanks!

## 2017-05-25 ENCOUNTER — Encounter: Payer: Self-pay | Admitting: *Deleted

## 2017-05-25 ENCOUNTER — Other Ambulatory Visit: Payer: Self-pay | Admitting: *Deleted

## 2017-05-25 MED ORDER — MONTELUKAST SODIUM 10 MG PO TABS: 10.0000 mg | ORAL_TABLET | Freq: Every day | ORAL | 0 refills | Status: DC

## 2017-05-25 NOTE — Telephone Encounter (Signed)
singulair refilled for 30 day supply. Patient needs office visit prior to anymore refills. Message sent to patient in My Chart.

## 2017-07-21 DIAGNOSIS — L570 Actinic keratosis: Secondary | ICD-10-CM | POA: Diagnosis not present

## 2017-07-21 DIAGNOSIS — D225 Melanocytic nevi of trunk: Secondary | ICD-10-CM | POA: Diagnosis not present

## 2017-07-21 DIAGNOSIS — L814 Other melanin hyperpigmentation: Secondary | ICD-10-CM | POA: Diagnosis not present

## 2017-07-21 DIAGNOSIS — Z85828 Personal history of other malignant neoplasm of skin: Secondary | ICD-10-CM | POA: Diagnosis not present

## 2017-07-21 DIAGNOSIS — C44622 Squamous cell carcinoma of skin of right upper limb, including shoulder: Secondary | ICD-10-CM | POA: Diagnosis not present

## 2017-07-21 DIAGNOSIS — D485 Neoplasm of uncertain behavior of skin: Secondary | ICD-10-CM | POA: Diagnosis not present

## 2017-07-21 DIAGNOSIS — D0462 Carcinoma in situ of skin of left upper limb, including shoulder: Secondary | ICD-10-CM | POA: Diagnosis not present

## 2017-07-21 DIAGNOSIS — C44729 Squamous cell carcinoma of skin of left lower limb, including hip: Secondary | ICD-10-CM | POA: Diagnosis not present

## 2017-07-21 DIAGNOSIS — L821 Other seborrheic keratosis: Secondary | ICD-10-CM | POA: Diagnosis not present

## 2017-07-21 DIAGNOSIS — D0461 Carcinoma in situ of skin of right upper limb, including shoulder: Secondary | ICD-10-CM | POA: Diagnosis not present

## 2017-08-10 ENCOUNTER — Encounter: Payer: Self-pay | Admitting: Family Medicine

## 2017-08-10 ENCOUNTER — Ambulatory Visit (INDEPENDENT_AMBULATORY_CARE_PROVIDER_SITE_OTHER): Payer: BLUE CROSS/BLUE SHIELD | Admitting: Family Medicine

## 2017-08-10 VITALS — BP 129/78 | HR 75 | Temp 98.6°F | Ht 65.0 in | Wt 111.8 lb

## 2017-08-10 DIAGNOSIS — S8991XA Unspecified injury of right lower leg, initial encounter: Secondary | ICD-10-CM | POA: Diagnosis not present

## 2017-08-10 MED ORDER — MONTELUKAST SODIUM 10 MG PO TABS: 10.0000 mg | ORAL_TABLET | Freq: Every day | ORAL | 3 refills | Status: DC

## 2017-08-10 MED ORDER — MUPIROCIN CALCIUM 2 % EX CREA: 1.0000 "application " | TOPICAL_CREAM | Freq: Two times a day (BID) | CUTANEOUS | 0 refills | Status: DC

## 2017-08-10 MED ORDER — MUPIROCIN 2 % EX OINT
1.0000 | TOPICAL_OINTMENT | Freq: Two times a day (BID) | CUTANEOUS | 0 refills | Status: DC
Start: 2017-08-10 — End: 2017-12-16

## 2017-08-10 NOTE — Patient Instructions (Signed)
Keep clean and dry. Use the Bactroban twice a day. It does not look infected. It will take a few weeks heal.

## 2017-08-10 NOTE — Progress Notes (Signed)
[name] , 02/29/1956, 62 y.o., female MRN: [id number] Patient Care Team    Relationship Specialty Notifications Start End  Ma Hillock, DO PCP - General Family Medicine  04/23/15   Volanda Napoleon, MD Consulting Physician Oncology  05/12/13   Maisie Fus, MD Consulting Physician Obstetrics and Gynecology  01/01/15   Chesley Mires, MD Consulting Physician Pulmonary Disease  02/22/16   Carolan Clines, MD Consulting Physician Urology  04/21/16   Ayesha Mohair  Dermatology  07/04/16    Comment: [facility]    Chief Complaint  Patient presents with  . Leg Pain    Pt c/o of wound on her right leg X 2week. She claim she hit her leg on the door of her dishwasher.     Subjective: Pt presents for an OV with complaints of injury of right shin of 2 weeks duration.  Associated symptoms include redness and tenderness. Pt has tried keeping area clean and dry and honey application to ease their symptoms.  She is concerned because it has been present for 2 weeks, and appears to still be mildly open and redness surrounding.  She denies any puslike drainage.  Denies any fever.  He has had a history of MRSA in the past.  Depression screen PHQ 2/9 08/10/2017  Decreased Interest 0  Down, Depressed, Hopeless 0  PHQ - 2 Score 0    No Known Allergies Social History   Tobacco Use  . Smoking status: Current Every Day Smoker    Packs/day: 1.00    Years: 29.00    Pack years: 29.00    Types: Cigarettes    Start date: 06/01/1978  . Smokeless tobacco: Never Used  . Tobacco comment: Current 3/4PPD (12/12/16)  Substance Use Topics  . Alcohol use: Yes    Alcohol/week: 4.2 oz    Types: 7 Glasses of wine per week   Past Medical History:  Diagnosis Date  . Basal cell carcinoma   . History of acute bronchitis 03/24/2011  . Lymphadenopathy, abdominal 03/2013   Noted on abd/pelv CT done to eval slow-to-resolve pyelo  . Meniere disorder 01/23/2012  .  Osteoporosis 12/2014   Dr. Nori Riis, her GYN, dx'd her  . Recurrent pyelonephritis   . Seasonal allergic rhinitis    Past Surgical History:  Procedure Laterality Date  . biopsy on skin cancer    . DEXA  12/2014   T score spine -2.4, hip -3.5 (Dr. Nori Riis, [facility] for [facility]  [name] DILATION AND CURETTAGE OF UTERUS  2009   Family History  Problem Relation Age of Onset  . Hypertension Mother   . Glaucoma Mother   . Irritable bowel syndrome Mother   . Arthritis Mother   . Kidney disease Mother   . Diabetes Mother   . Hypertension Father   . Heart disease Father        bypasses  . Cancer Father        sarcoma  . Cancer Maternal Grandmother        breast  . Heart disease Maternal Grandmother        CHF  . Stroke Maternal Grandmother        in his 64's  . Stroke Maternal Grandfather   . Other Paternal Grandmother        hardening of the arteries  . Heart attack Paternal Grandfather   . Colon cancer Paternal Aunt 17  . Stomach cancer Neg Hx  Allergies as of 08/10/2017   No Known Allergies     Medication List        Accurate as of 08/10/17 10:35 AM. Always use your most recent med list.          AEROCHAMBER MV inhaler Use as instructed   albuterol (2.5 MG/3ML) 0.083% nebulizer solution Commonly known as:  PROVENTIL Take 2.5 mg by nebulization every 6 (six) hours as needed for wheezing or shortness of breath.   albuterol 108 (90 Base) MCG/ACT inhaler Commonly known as:  VENTOLIN HFA Inhale 2 puffs into the lungs every 6 (six) hours as needed for wheezing or shortness of breath.   azelastine 0.1 % nasal spray Commonly known as:  ASTELIN Place 1 spray into both nostrils 2 (two) times daily. Use in each nostril as directed   budesonide-formoterol 160-4.5 MCG/ACT inhaler Commonly known as:  SYMBICORT Inhale 2 puffs into the lungs 2 (two) times daily.   buPROPion 150 MG 12 hr tablet Commonly known as:  ZYBAN Take 1 tablet (150 mg total) by mouth 2 (two) times  daily.   montelukast 10 MG tablet Commonly known as:  SINGULAIR Take 1 tablet (10 mg total) by mouth at bedtime.   multivitamin tablet Take 1 tablet by mouth daily.   nicotine 14 mg/24hr patch Commonly known as:  NICODERM CQ - dosed in mg/24 hours Place 1 patch (14 mg total) onto the skin daily.       All past medical history, surgical history, allergies, family history, immunizations andmedications were updated in the EMR today and reviewed under the history and medication portions of their EMR.     ROS: Negative, with the exception of above mentioned in HPI   Objective:  BP 129/78 (BP Location: Left Arm, Patient Position: Sitting, Cuff Size: Small)   Pulse 75   Temp 98.6 F (37 C) (Oral)   Ht 5\' 5"  (1.651 m)   Wt 111 lb 12.8 oz (50.7 kg)   SpO2 92%   BMI 18.60 kg/m  Body mass index is 18.6 kg/m. Gen: Afebrile. No acute distress. Nontoxic in appearance, well developed, well nourished.  HENT: AT. [place].  MMM Eyes:Pupils Equal Round Reactive to light, Extraocular movements intact,  Conjunctiva without redness, discharge or icterus. Skin: no rashes, no purpura or petechiae.  Approximately 1 cm scab formation with mild erythema surrounding.  No drainage. Neuro: Normal gait. PERLA. EOMi. Alert. Oriented x3  No exam data present No results found. No results found for this or any previous visit (from the past 24 hour(s)).  Assessment/Plan: [name] is a 63 y.o. female present for OV for  Injury of right shin, initial encounter - area does not appear infected currently. She is a smoker and this is in a difficult area to heal.  Surrounding area of "redness "is post injury/inflammation cause and not cellulitis.  Continue daily cleansing.  - Added Bactroban cream to regimen to be cautious. -Continue the above, if does not continue to improve over another 1-2 weeks could consider wound care referral.  Monitor for signs of infection, if drainage, swelling or redness worsen she  should be seen immediately.   Reviewed expectations re: course of current medical issues.  Discussed self-management of symptoms.  Outlined signs and symptoms indicating need for more acute intervention.  Patient verbalized understanding and all questions were answered.  Patient received an After-Visit Summary.    No orders of the defined types were placed in this encounter.    Note is  dictated utilizing voice recognition software. Although note has been proof read prior to signing, occasional typographical errors still can be missed. If any questions arise, please do not hesitate to call for verification.   electronically signed by:  Howard Pouch, DO  [place]

## 2017-09-08 ENCOUNTER — Ambulatory Visit (INDEPENDENT_AMBULATORY_CARE_PROVIDER_SITE_OTHER): Payer: BLUE CROSS/BLUE SHIELD

## 2017-09-08 ENCOUNTER — Encounter: Payer: Self-pay | Admitting: Family Medicine

## 2017-09-08 ENCOUNTER — Other Ambulatory Visit: Payer: Self-pay

## 2017-09-08 ENCOUNTER — Other Ambulatory Visit: Payer: Self-pay | Admitting: Pulmonary Disease

## 2017-09-08 ENCOUNTER — Ambulatory Visit (INDEPENDENT_AMBULATORY_CARE_PROVIDER_SITE_OTHER): Payer: BLUE CROSS/BLUE SHIELD | Admitting: Family Medicine

## 2017-09-08 VITALS — BP 132/86 | HR 115 | Temp 98.8°F | Resp 15 | Ht 65.0 in

## 2017-09-08 DIAGNOSIS — M25561 Pain in right knee: Secondary | ICD-10-CM | POA: Diagnosis not present

## 2017-09-08 DIAGNOSIS — M25562 Pain in left knee: Secondary | ICD-10-CM | POA: Diagnosis not present

## 2017-09-08 DIAGNOSIS — S8992XA Unspecified injury of left lower leg, initial encounter: Secondary | ICD-10-CM | POA: Diagnosis not present

## 2017-09-08 DIAGNOSIS — M810 Age-related osteoporosis without current pathological fracture: Secondary | ICD-10-CM

## 2017-09-08 NOTE — Progress Notes (Signed)
[name] - 62 y.o. female MRN [id number]  Date of birth: 1955-11-14  SUBJECTIVE:  Including CC & ROS.  Chief Complaint  Patient presents with  . Right knee pain    [name] is a 62 y.o. female that is presenting with right knee pain. She fell in her garden, she twisted her knee Pian is located on the lateral aspect.. Admits to swelling and tenderness. Pain is throbbing in nature. She cannot bear her weight on it. She has been taking Advil and applying ice.  She has trouble ambulating.  She was placed in a knee immobilizer yesterday.  Independent review of the right knee x-ray from 5/7 shows a small joint effusion.   Review of Systems  Constitutional: Negative for fever.  HENT: Negative for congestion.   Respiratory: Negative for cough.   Cardiovascular: Negative for chest pain.  Gastrointestinal: Negative for abdominal pain.  Musculoskeletal: Positive for gait problem and joint swelling.  Skin: Negative for color change.  Neurological: Negative for weakness.  Hematological: Negative for adenopathy.  Psychiatric/Behavioral: Negative for agitation.    HISTORY: Past Medical, Surgical, Social, and Family History Reviewed & Updated per EMR.   Pertinent Historical Findings include:  Past Medical History:  Diagnosis Date  . Basal cell carcinoma   . History of acute bronchitis 03/24/2011  . Lymphadenopathy, abdominal 03/2013   Noted on abd/pelv CT done to eval slow-to-resolve pyelo  . Meniere disorder 01/23/2012  . Osteoporosis 12/2014   Dr. Nori Riis, her GYN, dx'd her  . Recurrent pyelonephritis   . Seasonal allergic rhinitis     Past Surgical History:  Procedure Laterality Date  . biopsy on skin cancer    . DEXA  12/2014   T score spine -2.4, hip -3.5 (Dr. Nori Riis, Physicians for [facility].  [name] DILATION AND CURETTAGE OF UTERUS  2009    No Known Allergies  Family History  Problem Relation Age of Onset  . Hypertension Mother   . Glaucoma Mother   . Irritable  bowel syndrome Mother   . Arthritis Mother   . Kidney disease Mother   . Diabetes Mother   . Hypertension Father   . Heart disease Father        bypasses  . Cancer Father        sarcoma  . Cancer Maternal Grandmother        breast  . Heart disease Maternal Grandmother        CHF  . Stroke Maternal Grandmother        in his 56's  . Stroke Maternal Grandfather   . Other Paternal Grandmother        hardening of the arteries  . Heart attack Paternal Grandfather   . Colon cancer Paternal Aunt 40  . Stomach cancer Neg Hx      Social History   Socioeconomic History  . Marital status: Married    Spouse name: Not on file  . Number of children: Not on file  . Years of education: Not on file  . Highest education level: Not on file  Occupational History  . Occupation: housewife  Social Needs  . Financial resource strain: Not on file  . Food insecurity:    Worry: Not on file    Inability: Not on file  . Transportation needs:    Medical: Not on file    Non-medical: Not on file  Tobacco Use  . Smoking status: Current Every Day Smoker    Packs/day: 1.00  Years: 29.00    Pack years: 29.00    Types: Cigarettes    Start date: 06/01/1978  . Smokeless tobacco: Never Used  . Tobacco comment: Current 3/4PPD (12/12/16)  Substance and Sexual Activity  . Alcohol use: Yes    Alcohol/week: 4.2 oz    Types: 7 Glasses of wine per week  . Drug use: No  . Sexual activity: Yes    Partners: Male  Lifestyle  . Physical activity:    Days per week: Not on file    Minutes per session: Not on file  . Stress: Not on file  Relationships  . Social connections:    Talks on phone: Not on file    Gets together: Not on file    Attends religious service: Not on file    Active member of club or organization: Not on file    Attends meetings of clubs or organizations: Not on file    Relationship status: Not on file  . Intimate partner violence:    Fear of current or ex partner: Not on file     Emotionally abused: Not on file    Physically abused: Not on file    Forced sexual activity: Not on file  Other Topics Concern  . Not on file  Social History Narrative  . Not on file     PHYSICAL EXAM:  VS: BP 116/68 (BP Location: Right Arm, Patient Position: Sitting, Cuff Size: Normal)   Pulse 72   Ht 5\' 5"  (1.651 m)   Wt 108 lb (49 kg)   BMI 17.97 kg/m  Physical Exam Gen: NAD, alert, cooperative with exam, well-appearing ENT: normal lips, normal nasal mucosa,  Eye: normal EOM, normal conjunctiva and lids CV:  no edema, +2 pedal pulses   Resp: no accessory muscle use, non-labored,   Skin: no rashes, no areas of induration  Neuro: normal tone, normal sensation to touch Psych:  normal insight, alert and oriented MSK:  Right Knee: Normal to inspection with no erythema or effusion or obvious bony abnormalities. Tenderness to palpation over the lateral joint line. ROM full in flexion and extension and lower leg rotation. Ligaments with solid consistent endpoints including LCL, MCL. Negative Mcmurray's Non painful patellar compression. Patellar glide without crepitus. Patellar and quadriceps tendons unremarkable. Neurovascularly intact   Limited ultrasound: Right knee:  Mild to moderate effusion in the suprapatellar pouch Normal-appearing quadricep and patellar tendon Normal-appearing medial meniscus  Hypoechoic line going through the lateral meniscus to suggest a tear as well as an effusion in this area Normal-appearing LCL  Summary: Findings suggestive of a lateral meniscal tear  Ultrasound and interpretation by Clearance Coots, MD   Aspiration/Injection Procedure Note [name] 05/22/1955  Procedure: Injection Indications: Right knee pain  Procedure Details Consent: Risks of procedure as well as the alternatives and risks of each were explained to the (patient/caregiver).  Consent for procedure obtained. Time Out: Verified patient identification,  verified procedure, site/side was marked, verified correct patient position, special equipment/implants available, medications/allergies/relevent history reviewed, required imaging and test results available.  Performed.  The area was cleaned with iodine and alcohol swabs.    The right knee superior lateral spp was injected using 1 cc's of 40 mg Depomedrol and 4 cc's of 1% lidocaine with a 25 1 1/2" needle.  Ultrasound was used. Images were obtained in Long views showing the injection.    A sterile dressing was applied.  Patient did tolerate procedure well.      ASSESSMENT &  PLAN:   Acute lateral meniscal tear Pain seems to be related to her lateral meniscal tear.  Does have a small effusion. -Injection today -Placed in hinged knee brace -Provided naproxen -Counseled on ice and home exercise -Advised to follow-up in 2 weeks

## 2017-09-08 NOTE — Patient Instructions (Signed)
Please make a f/u appointment with Sports Medicine, [place] office.   Wear the knee brace and use crutches to ambulate until results of xrays are in. Use advil and tylenol for pain.   Please go to our [facility] office to get your xrays done. You can walk in M-F between 8am and 5pm. Tell them you are there for xrays ordered by me. They will send me the results, then I will let you know the results with instructions.   Address: [place], [place], [place]  (office sits at [address] at [address] intersection; from here, turn left onto [place] [address]), take to [address], turn right and go for a mile or so, office will be on left across form [employer] )  If you have any questions or concerns, please don't hesitate to send me a message via MyChart or call the office at [phone]. Thank you for visiting with [place] today! It's our pleasure caring for you.

## 2017-09-08 NOTE — Progress Notes (Signed)
Subjective  CC:  Chief Complaint  Patient presents with  . Leg Pain    Right leg, wheelbarrow     HPI: [name] is a 62 y.o. female who presents to the office today to address the problems listed above in the chief complaint.  Pt was pulling a full wheel barrow when it twisted and fell and pulled her over/down. Felt acute pain in lateral knee. Can't bare weight due to pain. No swelling. Had similar injury 2017 - I reviewed SM notes: had  Proximal fibular fracture and lateral meniscal tear at that time. She reports this feels the same.  I reviewed last dexa scan: osteoporosis with T + -3.4 femur 12/2014  I reviewed the patients updated PMH, FH, and SocHx. ; not on treatment.    Patient Active Problem List   Diagnosis Date Noted  . CAP (community acquired pneumonia) 08/25/2016  . Abnormal CT scan of lung 04/23/2016  . Pulmonary nodules/lesions, multiple 04/23/2016  . Mediastinal adenopathy 02/22/2016  . Current every day smoker 02/22/2016  . Encounter for smoking cessation counseling 02/22/2016  . Hematuria 06/07/2015  . Infection of urinary tract 04/20/2015  . Maxillary sinusitis, acute 03/02/2015  . Meniere disorder 01/23/2012  . Preventative health care 03/24/2011  . Squamous cell carcinoma   . Nicotine dependence 12/04/2008  . COPD (chronic obstructive pulmonary disease) ([place]) 12/04/2008   Current Meds  Medication Sig  . albuterol (PROVENTIL) (2.5 MG/3ML) 0.083% nebulizer solution Take 2.5 mg by nebulization every 6 (six) hours as needed for wheezing or shortness of breath.  [name] albuterol (VENTOLIN HFA) 108 (90 Base) MCG/ACT inhaler Inhale 2 puffs into the lungs every 6 (six) hours as needed for wheezing or shortness of breath.  [name] azelastine (ASTELIN) 0.1 % nasal spray Place 1 spray into both nostrils 2 (two) times daily. Use in each nostril as directed  . montelukast (SINGULAIR) 10 MG tablet Take 1 tablet (10 mg total) by mouth at bedtime.  . Multiple Vitamin  (MULTIVITAMIN) tablet Take 1 tablet by mouth daily.  . mupirocin ointment (BACTROBAN) 2 % Apply 1 application topically 2 (two) times daily.  [name] Spacer/Aero-Holding Chambers (AEROCHAMBER MV) inhaler Use as instructed  . SYMBICORT 160-4.5 MCG/ACT inhaler TAKE 2 PUFFS BY MOUTH TWICE A DAY  . tretinoin (RETIN-A) 0.025 % cream TAKE 1 APPLICATION THIN LAYER TO FACE NIGHTLY    Allergies: Patient has No Known Allergies. Family History: Patient family history includes Arthritis in her mother; Cancer in her father and maternal grandmother; Colon cancer (age of onset: 16) in her paternal aunt; Diabetes in her mother; Glaucoma in her mother; Heart attack in her paternal grandfather; Heart disease in her father and maternal grandmother; Hypertension in her father and mother; Irritable bowel syndrome in her mother; Kidney disease in her mother; Other in her paternal grandmother; Stroke in her maternal grandfather and maternal grandmother. Social History:  Patient  reports that she has been smoking cigarettes.  She started smoking about 39 years ago. She has a 29.00 pack-year smoking history. She has never used smokeless tobacco. She reports that she drinks about 4.2 oz of alcohol per week. She reports that she does not use drugs.  Review of Systems: Constitutional: Negative for fever malaise or anorexia Cardiovascular: negative for chest pain Respiratory: negative for SOB or persistent cough Gastrointestinal: negative for abdominal pain  Objective  Vitals: BP 132/86   Pulse (!) 115   Temp 98.8 F (37.1 C) (Oral)   Resp 15   Ht  5\' 5"  (1.651 m)   SpO2 93%   BMI 18.60 kg/m  General: no acute distress , A&Ox3 HEENT: PEERL, conjunctiva normal, Oropharynx moist,neck is supple Cardiovascular:  RRR without murmur or gallop.  Respiratory:  Good breath sounds bilaterally, CTAB with normal respiratory effort Skin:  Warm, no rashes  Assessment  1. Acute pain of right knee   2. Osteoporosis, unspecified  osteoporosis type, unspecified pathological fracture presence      Plan   Fall and knee pain:  Refer for xrays, brace and crutches until dxd made. Advil/tylenol. F/u with sports med for definitive tx.   Discussed osteoporosis and need for f/u scan and discussion regarding treatments. Especially important if indeed has another pathologic fracture.   Follow up: see above    Commons side effects, risks, benefits, and alternatives for medications and treatment plan prescribed today were discussed, and the patient expressed understanding of the given instructions. Patient is instructed to call or message via MyChart if he/she has any questions or concerns regarding our treatment plan. No barriers to understanding were identified. We discussed Red Flag symptoms and signs in detail. Patient expressed understanding regarding what to do in case of urgent or emergency type symptoms.   Medication list was reconciled, printed and provided to the patient in AVS. Patient instructions and summary information was reviewed with the patient as documented in the AVS. This note was prepared with assistance of Dragon voice recognition software. Occasional wrong-word or sound-a-like substitutions may have occurred due to the inherent limitations of voice recognition software  Orders Placed This Encounter  Procedures  . DG Knee Complete 4 Views Right   No orders of the defined types were placed in this encounter.

## 2017-09-09 ENCOUNTER — Encounter: Payer: Self-pay | Admitting: Family Medicine

## 2017-09-09 ENCOUNTER — Ambulatory Visit: Payer: Self-pay

## 2017-09-09 ENCOUNTER — Ambulatory Visit (INDEPENDENT_AMBULATORY_CARE_PROVIDER_SITE_OTHER): Payer: BLUE CROSS/BLUE SHIELD | Admitting: Family Medicine

## 2017-09-09 VITALS — BP 116/68 | HR 72 | Ht 65.0 in | Wt 108.0 lb

## 2017-09-09 DIAGNOSIS — S83281A Other tear of lateral meniscus, current injury, right knee, initial encounter: Secondary | ICD-10-CM

## 2017-09-09 DIAGNOSIS — S83289A Other tear of lateral meniscus, current injury, unspecified knee, initial encounter: Secondary | ICD-10-CM | POA: Insufficient documentation

## 2017-09-09 DIAGNOSIS — M25561 Pain in right knee: Secondary | ICD-10-CM

## 2017-09-09 HISTORY — DX: Other tear of lateral meniscus, current injury, unspecified knee, initial encounter: S83.289A

## 2017-09-09 MED ORDER — NAPROXEN 500 MG PO TABS: 500.0000 mg | ORAL_TABLET | Freq: Two times a day (BID) | ORAL | 1 refills | Status: DC

## 2017-09-09 NOTE — Assessment & Plan Note (Signed)
Pain seems to be related to her lateral meniscal tear.  Does have a small effusion. -Injection today -Placed in hinged knee brace -Provided naproxen -Counseled on ice and home exercise -Advised to follow-up in 2 weeks

## 2017-09-09 NOTE — Patient Instructions (Signed)
Nice to meet you  Please ice your knee  Please keep the knee elevated  Please wear the brace if you are up and walking around.  Please follow up in 2 weeks with Dr. Tamala Julian or myself.

## 2017-09-10 ENCOUNTER — Telehealth: Payer: Self-pay | Admitting: Family Medicine

## 2017-09-10 ENCOUNTER — Ambulatory Visit: Payer: Self-pay

## 2017-09-10 NOTE — Telephone Encounter (Signed)
  Reason for Disposition . [1] Thigh, calf, or ankle swelling AND [2] only 1 side  Answer Assessment - Initial Assessment Questions 1. ONSET: "When did the swelling start?" (e.g., minutes, hours, days)     09/08/17 after a fall 2. LOCATION: "What part of the leg is swollen?"  "Are both legs swollen or just one leg?"     Right knee, calf, and ankle  3. SEVERITY: "How bad is the swelling?" (e.g., localized; mild, moderate, severe)  - Localized - small area of swelling localized to one leg  - MILD pedal edema - swelling limited to foot and ankle, pitting edema < 1/4 inch (6 mm) deep, rest and elevation eliminate most or all swelling  - MODERATE edema - swelling of lower leg to knee, pitting edema > 1/4 inch (6 mm) deep, rest and elevation only partially reduce swelling  - SEVERE edema - swelling extends above knee, facial or hand swelling present      Moderate 4. REDNESS: "Does the swelling look red or infected?"     Denied redness or warmth 5. PAIN: "Is the swelling painful to touch?" If so, ask: "How painful is it?"   (Scale 1-10; mild, moderate or severe)     "soreness" under right knee cap and lateral knee 6. FEVER: "Do you have a fever?" If so, ask: "What is it, how was it measured, and when did it start?"      denied 7. CAUSE: "What do you think is causing the leg swelling?"     Injury; recent knee injection 8. MEDICAL HISTORY: "Do you have a history of heart failure, kidney disease, liver failure, or cancer?"    Didn't ask 9. RECURRENT SYMPTOM: "Have you had leg swelling before?" If so, ask: "When was the last time?" "What happened that time?"     No   10. OTHER SYMPTOMS: "Do you have any other symptoms?" (e.g., chest pain, difficulty breathing)       No; denied 11. PREGNANCY: "Is there any chance you are pregnant?" "When was your last menstrual period?"      N/a  Protocols used: LEG SWELLING AND EDEMA-A-AH

## 2017-09-10 NOTE — Telephone Encounter (Signed)
Left VM for patient. If she calls back please have her speak with a nurse/CMA and inform that if there is a suspicion of DVT then we can order a dupex. She can come by [facility] and pick up samples of Xarelto. The [facility] can report results to patient.   If any questions then please take the best time and phone number to call and I will try to call her back.   Rosemarie Ax, MD [facility] 09/10/2017, 5:05 PM

## 2017-09-10 NOTE — Telephone Encounter (Signed)
Reported increased swelling of the right knee and right ankle.  Stated my "right calf feels tight,  but I don't think it is swollen."  Questioned if the increased swelling was to be expected?  Denied any redness, warmth of the right knee; denied fever/ chills.  Stated she has noted soreness under the right knee cap, and lateral knee, that was not present yesterday. Stated her knee aches and throbs.  Reported she decided to get the Naproxen filled, and started this today.  questioned if she had been icing and elevating the right knee.  Reported she had been icing continually, but her lower leg has gotten cool.  Stated that she elevates her leg while in a recliner, or when sitting, she props it on another chair.  Encouraged to elevate the right LE, so that it is above level of the heart.  Advised to intermittently ice the knee area for 20 min. intervals.  The pt. stated she has had ice on it continually.  Advised pt. Will make Dr. Raeford Razor aware of current symptoms.  Encouraged pt. To watch for signs of infection, and call back if symptoms worsen.   Called Flow Coordinator re: pt. swelling of right LE, and requested Dr. Raeford Razor be advised for further recommendations.  FC stated she will forward the information to Dr. Raeford Razor nurse.

## 2017-09-15 NOTE — Progress Notes (Signed)
[name] [address], [place] [address] Phone: [phone] Subjective:    I'm seeing this patient by the request  of:    CC: Right knee pain, right shoulder pain  OAC:[id number]  [name] is a 62 y.o. female coming in with complaint of shoulder pain. She has had pain for a while but she injured her knee last week and has to ambulate with crutches which have caused an increase in her pain. Pain is felt deep in the joint on the anterior portion of the shoulder. Denies any numbness or tingling. Pain radiates into the bicep and does catch from time to time.   Patient was also seen recently for a right knee pain.  Patient was diagnosed with a meniscal tear and given an injection.  Wearing the brace.  Still has a feeling of instability noted of the knee.    Past Medical History:  Diagnosis Date  . Basal cell carcinoma   . History of acute bronchitis 03/24/2011  . Lymphadenopathy, abdominal 03/2013   Noted on abd/pelv CT done to eval slow-to-resolve pyelo  . Meniere disorder 01/23/2012  . Osteoporosis 12/2014   Dr. Nori Riis, her GYN, dx'd her  . Recurrent pyelonephritis   . Seasonal allergic rhinitis    Past Surgical History:  Procedure Laterality Date  . biopsy on skin cancer    . DEXA  12/2014   T score spine -2.4, hip -3.5 (Dr. Nori Riis, [facility].  [name] DILATION AND CURETTAGE OF UTERUS  2009   Social History   Socioeconomic History  . Marital status: Married    Spouse name: Not on file  . Number of children: Not on file  . Years of education: Not on file  . Highest education level: Not on file  Occupational History  . Occupation: housewife  Social Needs  . Financial resource strain: Not on file  . Food insecurity:    Worry: Not on file    Inability: Not on file  . Transportation needs:    Medical: Not on file    Non-medical: Not on file  Tobacco Use  . Smoking status: Current Every Day Smoker    Packs/day:  1.00    Years: 29.00    Pack years: 29.00    Types: Cigarettes    Start date: 06/01/1978  . Smokeless tobacco: Never Used  . Tobacco comment: Current 3/4PPD (12/12/16)  Substance and Sexual Activity  . Alcohol use: Yes    Alcohol/week: 4.2 oz    Types: 7 Glasses of wine per week  . Drug use: No  . Sexual activity: Yes    Partners: Male  Lifestyle  . Physical activity:    Days per week: Not on file    Minutes per session: Not on file  . Stress: Not on file  Relationships  . Social connections:    Talks on phone: Not on file    Gets together: Not on file    Attends religious service: Not on file    Active member of club or organization: Not on file    Attends meetings of clubs or organizations: Not on file    Relationship status: Not on file  Other Topics Concern  . Not on file  Social History Narrative  . Not on file   No Known Allergies Family History  Problem Relation Age of Onset  . Hypertension Mother   . Glaucoma Mother   . Irritable bowel syndrome Mother   .  Arthritis Mother   . Kidney disease Mother   . Diabetes Mother   . Hypertension Father   . Heart disease Father        bypasses  . Cancer Father        sarcoma  . Cancer Maternal Grandmother        breast  . Heart disease Maternal Grandmother        CHF  . Stroke Maternal Grandmother        in his 67's  . Stroke Maternal Grandfather   . Other Paternal Grandmother        hardening of the arteries  . Heart attack Paternal Grandfather   . Colon cancer Paternal Aunt 73  . Stomach cancer Neg Hx      Past medical history, social, surgical and family history all reviewed in electronic medical record.  No pertanent information unless stated regarding to the chief complaint.   Review of Systems:Review of systems updated and as accurate as of 09/16/17  No headache, visual changes, nausea, vomiting, diarrhea, constipation, dizziness, abdominal pain, skin rash, fevers, chills, night sweats, weight loss,  swollen lymph nodes, body aches, joint swelling, muscle aches, chest pain, shortness of breath, mood changes.   Objective  Blood pressure (!) 132/98, pulse 65, height 5\' 5"  (1.651 m), weight 110 lb (49.9 kg), SpO2 96 %. Systems examined below as of 09/16/17   General: No apparent distress alert and oriented x3 mood and affect normal, dressed appropriately.  Cachectic HEENT: Pupils equal, extraocular movements intact  Respiratory: Patient's speak in full sentences and does not appear short of breath  Cardiovascular: No lower extremity edema, non tender, no erythema  Skin: Warm dry intact with no signs of infection or rash on extremities or on axial skeleton.  Abdomen: Soft nontender  Neuro: Cranial nerves II through XII are intact, neurovascularly intact in all extremities with 2+ DTRs and 2+ pulses.  Lymph: No lymphadenopathy of posterior or anterior cervical chain or axillae bilaterally.  Gait antalgic MSK:  Non tender with full range of motion and good stability and symmetric strength and tone of  elbows, wrist, hip and ankles bilaterally.   Shoulder: Right Inspection reveals no abnormalities, atrophy or asymmetry. Palpation is normal with no tenderness over AC joint or bicipital groove. ROM is full in all planes passively. Rotator cuff strength normal throughout. signs of impingement with positive Neer and Hawkin's tests, but negative empty can sign. Speeds and Yergason's tests normal. No labral pathology noted with negative Obrien's, negative clunk and good stability. Normal scapular function observed. No painful arc and no drop arm sign. No apprehension sign Contralateral shoulder unremarkable  Knee: Right Normal to inspection with no erythema or effusion or obvious bony abnormalities. Palpation normal with no warmth, joint line tenderness, patellar tenderness, or condyle tenderness. ROM full in flexion and extension and lower leg rotation. Ligaments with solid consistent  endpoints including ACL, PCL, LCL, MCL.  Pain with stressing of the MCL Positive Mcmurray's, Apley's, and Thessalonian tests. Non painful patellar compression. Patellar glide without crepitus. Patellar and quadriceps tendons unremarkable. Hamstring and quadriceps strength is normal. Contralateral knee unremarkable  MSK US performed of: Right This study was ordered, performed, and interpreted by Charlann Boxer D.O.  Shoulder:   Supraspinatus:  Appears normal on long and transverse views, Bursal bulge seen with shoulder abduction on impingement view.  Calcific changes Infraspinatus:  Appears normal on long and transverse views. Significant increase in Doppler flow Subscapularis:  Appears normal on long and  transverse views. Positive bursa Teres Minor:  Appears normal on long and transverse views. AC joint:  Capsule undistended, no geyser sign. Glenohumeral Joint:  Appears normal without effusion. Glenoid Labrum:  Intact without visualized tears. Biceps Tendon:  Appears normal on long and transverse views, no fraying of tendon, tendon located in intertubercular groove, no subluxation with shoulder internal or external rotation.  Impression: Subacromial bursitis  MSK US performed of: Right knee This study was ordered, performed, and interpreted by Charlann Boxer D.O.  Limited ultrasound of patient's right knee does show the patient has a displaced large medial meniscal tear noted.  Degenerative lateral meniscal tear also noted.  No effusion noted which is improvement from patient's previous exam.  IMPRESSION: Medial meniscal tear     Impression and Recommendations:     This case required medical decision making of moderate complexity.      Note: This dictation was prepared with Dragon dictation along with smaller phrase technology. Any transcriptional errors that result from this process are unintentional.

## 2017-09-16 ENCOUNTER — Telehealth: Payer: Self-pay | Admitting: Family Medicine

## 2017-09-16 ENCOUNTER — Ambulatory Visit (INDEPENDENT_AMBULATORY_CARE_PROVIDER_SITE_OTHER): Payer: BLUE CROSS/BLUE SHIELD | Admitting: Family Medicine

## 2017-09-16 ENCOUNTER — Encounter: Payer: Self-pay | Admitting: Family Medicine

## 2017-09-16 ENCOUNTER — Ambulatory Visit: Payer: Self-pay

## 2017-09-16 VITALS — BP 132/98 | HR 65 | Ht 65.0 in | Wt 110.0 lb

## 2017-09-16 DIAGNOSIS — S83281A Other tear of lateral meniscus, current injury, right knee, initial encounter: Secondary | ICD-10-CM | POA: Diagnosis not present

## 2017-09-16 DIAGNOSIS — M7551 Bursitis of right shoulder: Secondary | ICD-10-CM

## 2017-09-16 DIAGNOSIS — M25511 Pain in right shoulder: Secondary | ICD-10-CM | POA: Diagnosis not present

## 2017-09-16 HISTORY — DX: Bursitis of right shoulder: M75.51

## 2017-09-16 MED ORDER — DICLOFENAC SODIUM 2 % TD SOLN: 2.0000 g | Freq: Two times a day (BID) | TRANSDERMAL | 3 refills | Status: DC

## 2017-09-16 MED ORDER — VITAMIN D (ERGOCALCIFEROL) 1.25 MG (50000 UNIT) PO CAPS: 50000.0000 [IU] | ORAL_CAPSULE | ORAL | 0 refills | Status: DC

## 2017-09-16 NOTE — Telephone Encounter (Signed)
Copied from [place] [id number]. Topic: Quick Communication - Rx Refill/Question >> Sep 16, 2017  2:46 PM Margot Ables wrote: Medication: pt states that she cannot get Pennsaid discount because she has [employer] thru [employer]. Pt is asking for other options as the pharmacy advised pt of $2000.

## 2017-09-16 NOTE — Telephone Encounter (Signed)
Left msg making pt aware she can come by the office to pick up samples of pennsaid.

## 2017-09-16 NOTE — Assessment & Plan Note (Signed)
Actually seems more of a larger tear of the medial meniscus.  Concerned with patient having to potential meniscal tears in the healing.  We discussed continue with the bracing, home exercise, which activities to do which wants to avoid.  We discussed any worsening symptoms that we would need to consider advanced imaging.  Patient will follow up with me again in 4 to 6 weeks

## 2017-09-16 NOTE — Assessment & Plan Note (Signed)
Patient has made progress.  Discussed icing regimen and home exercise.  Discussed which activities to do which wants to avoid.  I believe the patient will do well with conservative therapy.  Worsening symptoms consider injection.

## 2017-09-16 NOTE — Patient Instructions (Signed)
Good to see you  Meniscal tear and rotatorcuff sprain  Ice 20 minutes 2 times daily. Usually after activity and before bed. pennsaid pinkie amount topically 2 times daily as needed.  Once weekly vitamin D for 12 weeks No jumping, running, twisting or stooping. Otherwise you can do what you want Wear the brace still out of the house and with a lot of activity for another 2 weeks See me again in 4 weeks

## 2017-09-25 ENCOUNTER — Ambulatory Visit: Payer: BLUE CROSS/BLUE SHIELD | Admitting: Family Medicine

## 2017-10-03 DIAGNOSIS — N390 Urinary tract infection, site not specified: Secondary | ICD-10-CM | POA: Diagnosis not present

## 2017-10-14 NOTE — Progress Notes (Signed)
[name] [address], [place] [address] Phone: [phone] Subjective:     CC: Shoulder and knee pain follow-up  XVQ:[id number]  [name] is a 62 y.o. female coming in with complaint of shoulder and knee pain. She said that her shoulder is still bothering her. Pain with most movements especially flexion and abduction.  Patient states can even wake her up at night sometimes.  Sometimes can be just a constant aching sensation.  Knee pain has improved but she states that sometimes her knees feel like "jelly." She feels like her knees are weak and unstable as she ascends stairs.  Sometimes feels like there is some instability as well.  Seems to be worsening over the course of time.      Past Medical History:  Diagnosis Date  . Basal cell carcinoma   . History of acute bronchitis 03/24/2011  . Lymphadenopathy, abdominal 03/2013   Noted on abd/pelv CT done to eval slow-to-resolve pyelo  . Meniere disorder 01/23/2012  . Osteoporosis 12/2014   Dr. Nori Riis, her GYN, dx'd her  . Recurrent pyelonephritis   . Seasonal allergic rhinitis    Past Surgical History:  Procedure Laterality Date  . biopsy on skin cancer    . DEXA  12/2014   T score spine -2.4, hip -3.5 (Dr. Nori Riis, [facility].  [name] DILATION AND CURETTAGE OF UTERUS  2009   Social History   Socioeconomic History  . Marital status: Married    Spouse name: Not on file  . Number of children: Not on file  . Years of education: Not on file  . Highest education level: Not on file  Occupational History  . Occupation: housewife  Social Needs  . Financial resource strain: Not on file  . Food insecurity:    Worry: Not on file    Inability: Not on file  . Transportation needs:    Medical: Not on file    Non-medical: Not on file  Tobacco Use  . Smoking status: Current Every Day Smoker    Packs/day: 1.00    Years: 29.00    Pack years: 29.00    Types: Cigarettes   Start date: 06/01/1978  . Smokeless tobacco: Never Used  . Tobacco comment: Current 3/4PPD (12/12/16)  Substance and Sexual Activity  . Alcohol use: Yes    Alcohol/week: 4.2 oz    Types: 7 Glasses of wine per week  . Drug use: No  . Sexual activity: Yes    Partners: Male  Lifestyle  . Physical activity:    Days per week: Not on file    Minutes per session: Not on file  . Stress: Not on file  Relationships  . Social connections:    Talks on phone: Not on file    Gets together: Not on file    Attends religious service: Not on file    Active member of club or organization: Not on file    Attends meetings of clubs or organizations: Not on file    Relationship status: Not on file  Other Topics Concern  . Not on file  Social History Narrative  . Not on file   No Known Allergies Family History  Problem Relation Age of Onset  . Hypertension Mother   . Glaucoma Mother   . Irritable bowel syndrome Mother   . Arthritis Mother   . Kidney disease Mother   . Diabetes Mother   . Hypertension Father   . Heart  disease Father        bypasses  . Cancer Father        sarcoma  . Cancer Maternal Grandmother        breast  . Heart disease Maternal Grandmother        CHF  . Stroke Maternal Grandmother        in his 81's  . Stroke Maternal Grandfather   . Other Paternal Grandmother        hardening of the arteries  . Heart attack Paternal Grandfather   . Colon cancer Paternal Aunt 29  . Stomach cancer Neg Hx      Past medical history, social, surgical and family history all reviewed in electronic medical record.  No pertanent information unless stated regarding to the chief complaint.   Review of Systems:Review of systems updated and as accurate as of 10/15/17  No headache, visual changes, nausea, vomiting, diarrhea, constipation, dizziness, abdominal pain, skin rash, fevers, chills, night sweats, weight loss, swollen lymph nodes,  joint swelling,, chest pain, shortness of breath,  mood changes.  Positive muscle aches, body aches  Objective  Blood pressure 132/72, height 5\' 5"  (1.651 m), weight 109 lb (49.4 kg). Systems examined below as of 10/15/17   General: No apparent distress alert and oriented x3 mood and affect normal, dressed appropriately.  HEENT: Pupils equal, extraocular movements intact  Respiratory: Patient's speak in full sentences and does not appear short of breath  Cardiovascular: No lower extremity edema, non tender, no erythema  Skin: Warm dry intact with no signs of infection or rash on extremities or on axial skeleton.  Abdomen: Soft nontender  Neuro: Cranial nerves II through XII are intact, neurovascularly intact in all extremities with 2+ DTRs and 2+ pulses.  Lymph: No lymphadenopathy of posterior or anterior cervical chain or axillae bilaterally.  Gait normal with good balance and coordination.  MSK:  Non tender with full range of motion and good stability and symmetric strength and tone of  elbows, wrist, hip, and ankles bilaterally.   Shoulder exam shows the patient does have some mild positive impingement still remaining but significant improvement in range of motion.  Rotator cuff strength is symmetric and 5 out of 5 strength.  Right knee exam shows some mild patellofemoral.  Patient does have some minimal tenderness on exam.  Mild lateral tracking of the patella.  Patient does have some atrophy of the thighs bilaterally.  Mild back pain on the right side.  Paraspinal musculature noted.  Patient has some tightness with right-sided straight leg test.     Impression and Recommendations:     This case required medical decision making of moderate complexity.      Note: This dictation was prepared with Dragon dictation along with smaller phrase technology. Any transcriptional errors that result from this process are unintentional.

## 2017-10-15 ENCOUNTER — Ambulatory Visit (INDEPENDENT_AMBULATORY_CARE_PROVIDER_SITE_OTHER): Payer: BLUE CROSS/BLUE SHIELD | Admitting: Family Medicine

## 2017-10-15 ENCOUNTER — Ambulatory Visit (INDEPENDENT_AMBULATORY_CARE_PROVIDER_SITE_OTHER)
Admission: RE | Admit: 2017-10-15 | Discharge: 2017-10-15 | Disposition: A | Discharge status: Home / Self Care | Payer: BLUE CROSS/BLUE SHIELD | Source: Ambulatory Visit | Attending: Family Medicine | Admitting: Family Medicine

## 2017-10-15 ENCOUNTER — Ambulatory Visit: Payer: Self-pay

## 2017-10-15 ENCOUNTER — Encounter: Payer: Self-pay | Admitting: Family Medicine

## 2017-10-15 ENCOUNTER — Other Ambulatory Visit (INDEPENDENT_AMBULATORY_CARE_PROVIDER_SITE_OTHER): Payer: BLUE CROSS/BLUE SHIELD

## 2017-10-15 VITALS — BP 132/72 | Ht 65.0 in | Wt 109.0 lb

## 2017-10-15 DIAGNOSIS — M549 Dorsalgia, unspecified: Secondary | ICD-10-CM

## 2017-10-15 DIAGNOSIS — M2550 Pain in unspecified joint: Secondary | ICD-10-CM | POA: Diagnosis not present

## 2017-10-15 DIAGNOSIS — G8929 Other chronic pain: Secondary | ICD-10-CM

## 2017-10-15 DIAGNOSIS — M25561 Pain in right knee: Principal | ICD-10-CM

## 2017-10-15 DIAGNOSIS — M5441 Lumbago with sciatica, right side: Secondary | ICD-10-CM | POA: Diagnosis not present

## 2017-10-15 DIAGNOSIS — M545 Low back pain, unspecified: Secondary | ICD-10-CM | POA: Insufficient documentation

## 2017-10-15 DIAGNOSIS — M5442 Lumbago with sciatica, left side: Secondary | ICD-10-CM

## 2017-10-15 DIAGNOSIS — M25562 Pain in left knee: Secondary | ICD-10-CM

## 2017-10-15 DIAGNOSIS — M7551 Bursitis of right shoulder: Secondary | ICD-10-CM

## 2017-10-15 LAB — URIC ACID: Uric Acid, Serum: 3.9 mg/dL (ref 2.4–7.0)

## 2017-10-15 LAB — IBC PANEL
Iron: 134 ug/dL (ref 42–145)
SATURATION RATIOS: 39.4 % (ref 20.0–50.0)
TRANSFERRIN: 243 mg/dL (ref 212.0–360.0)

## 2017-10-15 LAB — SEDIMENTATION RATE: Sed Rate: 54 mm/hr — ABNORMAL HIGH (ref 0–30)

## 2017-10-15 NOTE — Assessment & Plan Note (Signed)
Patient has recurrent bursitis.  Will hold on An injection.  We will continue to monitor.  Worsening symptoms we will consider further exam.

## 2017-10-15 NOTE — Patient Instructions (Signed)
Hope you are doing well  Ice 20 minutes 2 times daily. Usually after activity and before bed. Xray of back and labs Exercises 3 times a week.   Gabapentin 200mg  at night to help the nerve  I hope the back feels better See me again in 4 weeks

## 2017-10-15 NOTE — Assessment & Plan Note (Signed)
Mostly pain overall.  Discussed icing regimen and home exercise.  Discussed which activities to do which wants to avoid.  Discussed topical anti-inflammatories.  Discussed avoiding certain activities.  Patient is to increase activity as tolerated.  Follow-up again in 4 weeks

## 2017-10-15 NOTE — Assessment & Plan Note (Signed)
Discussed omentum polyarthralgia.  Discussed that we may need to consider possibly further work-up and patient will get laboratory work-up for sedimentation at this point.  Depending on these findings we will discuss other treatment.

## 2017-10-16 ENCOUNTER — Telehealth: Payer: Self-pay | Admitting: Family Medicine

## 2017-10-16 MED ORDER — GABAPENTIN 100 MG PO CAPS: 200.0000 mg | ORAL_CAPSULE | Freq: Every day | ORAL | 1 refills | Status: DC

## 2017-10-16 NOTE — Telephone Encounter (Signed)
rx sent to pharmacy.  Pt made aware.

## 2017-10-16 NOTE — Telephone Encounter (Signed)
Copied from [place] [id number]. Topic: General - Other >> Oct 16, 2017  9:07 AM Lennox Solders wrote: Reason for CRM:pt is calling she saw dr Tamala Julian yesterday. Pt is still waiting on gabapentin to be sent to [facility]

## 2017-10-19 LAB — PTH, INTACT AND CALCIUM
Calcium: 9.8 mg/dL (ref 8.6–10.4)
PTH: 29 pg/mL (ref 14–64)

## 2017-10-19 LAB — VITAMIN D 1,25 DIHYDROXY
Vitamin D 1, 25 (OH)2 Total: 64 pg/mL (ref 18–72)
Vitamin D2 1, 25 (OH)2: 16 pg/mL
Vitamin D3 1, 25 (OH)2: 48 pg/mL

## 2017-10-19 LAB — CALCIUM, IONIZED: CALCIUM ION: 5.2 mg/dL (ref 4.8–5.6)

## 2017-10-24 ENCOUNTER — Telehealth: Payer: BLUE CROSS/BLUE SHIELD | Admitting: Physician Assistant

## 2017-10-24 DIAGNOSIS — B9689 Other specified bacterial agents as the cause of diseases classified elsewhere: Secondary | ICD-10-CM

## 2017-10-24 DIAGNOSIS — J208 Acute bronchitis due to other specified organisms: Secondary | ICD-10-CM

## 2017-10-24 MED ORDER — AZITHROMYCIN 250 MG PO TABS: ORAL_TABLET | ORAL | 0 refills | Status: DC

## 2017-10-24 MED ORDER — BENZONATATE 100 MG PO CAPS: 100.0000 mg | ORAL_CAPSULE | Freq: Three times a day (TID) | ORAL | 0 refills | Status: DC | PRN

## 2017-10-24 NOTE — Progress Notes (Signed)
We are sorry that you are not feeling well.  Here is how we plan to help!  Based on your presentation I believe you most likely have A cough due to bacteria.  When patients have a fever and a productive cough with a change in color or increased sputum production, we are concerned about bacterial bronchitis.  If left untreated it can progress to pneumonia.  If your symptoms do not improve with your treatment plan it is important that you contact your provider.   I have prescribed Azithromyin 250 mg: two tablets now and then one tablet daily for 4 additonal days    In addition you may use A prescription cough medication called Tessalon Perles 100mg. You may take 1-2 capsules every 8 hours as needed for your cough.   From your responses in the eVisit questionnaire you describe inflammation in the upper respiratory tract which is causing a significant cough.  This is commonly called Bronchitis and has four common causes:    Allergies  Viral Infections  Acid Reflux  Bacterial Infection Allergies, viruses and acid reflux are treated by controlling symptoms or eliminating the cause. An example might be a cough caused by taking certain blood pressure medications. You stop the cough by changing the medication. Another example might be a cough caused by acid reflux. Controlling the reflux helps control the cough.  USE OF BRONCHODILATOR ("RESCUE") INHALERS: There is a risk from using your bronchodilator too frequently.  The risk is that over-reliance on a medication which only relaxes the muscles surrounding the breathing tubes can reduce the effectiveness of medications prescribed to reduce swelling and congestion of the tubes themselves.  Although you feel brief relief from the bronchodilator inhaler, your asthma may actually be worsening with the tubes becoming more swollen and filled with mucus.  This can delay other crucial treatments, such as oral steroid medications. If you need to use a  bronchodilator inhaler daily, several times per day, you should discuss this with your provider.  There are probably better treatments that could be used to keep your asthma under control.     HOME CARE . Only take medications as instructed by your medical team. . Complete the entire course of an antibiotic. . Drink plenty of fluids and get plenty of rest. . Avoid close contacts especially the very young and the elderly . Cover your mouth if you cough or cough into your sleeve. . Always remember to wash your hands . A steam or ultrasonic humidifier can help congestion.   GET HELP RIGHT AWAY IF: . You develop worsening fever. . You become short of breath . You cough up blood. . Your symptoms persist after you have completed your treatment plan MAKE SURE YOU   Understand these instructions.  Will watch your condition.  Will get help right away if you are not doing well or get worse.  Your e-visit answers were reviewed by a board certified advanced clinical practitioner to complete your personal care plan.  Depending on the condition, your plan could have included both over the counter or prescription medications. If there is a problem please reply  once you have received a response from your provider. Your safety is important to us.  If you have drug allergies check your prescription carefully.    You can use MyChart to ask questions about today's visit, request a non-urgent call back, or ask for a work or school excuse for 24 hours related to this e-Visit. If it has been   greater than 24 hours you will need to follow up with your provider, or enter a new e-Visit to address those concerns. You will get an e-mail in the next two days asking about your experience.  I hope that your e-visit has been valuable and will speed your recovery. Thank you for using e-visits.   

## 2017-10-27 ENCOUNTER — Other Ambulatory Visit: Payer: Self-pay | Admitting: Pulmonary Disease

## 2017-10-27 ENCOUNTER — Encounter: Payer: Self-pay | Admitting: Family Medicine

## 2017-10-28 ENCOUNTER — Ambulatory Visit (INDEPENDENT_AMBULATORY_CARE_PROVIDER_SITE_OTHER): Payer: BLUE CROSS/BLUE SHIELD | Admitting: Family Medicine

## 2017-10-28 ENCOUNTER — Encounter: Payer: Self-pay | Admitting: Family Medicine

## 2017-10-28 VITALS — BP 134/82 | HR 79 | Temp 99.4°F | Resp 20 | Ht 65.0 in | Wt 108.0 lb

## 2017-10-28 DIAGNOSIS — N12 Tubulo-interstitial nephritis, not specified as acute or chronic: Secondary | ICD-10-CM | POA: Insufficient documentation

## 2017-10-28 DIAGNOSIS — R509 Fever, unspecified: Secondary | ICD-10-CM

## 2017-10-28 DIAGNOSIS — R109 Unspecified abdominal pain: Secondary | ICD-10-CM

## 2017-10-28 DIAGNOSIS — M810 Age-related osteoporosis without current pathological fracture: Secondary | ICD-10-CM | POA: Diagnosis not present

## 2017-10-28 HISTORY — DX: Tubulo-interstitial nephritis, not specified as acute or chronic: N12

## 2017-10-28 LAB — CBC WITH DIFFERENTIAL/PLATELET
Basophils Absolute: 0.5 10*3/uL — ABNORMAL HIGH (ref 0.0–0.1)
Basophils Relative: 5.2 % — ABNORMAL HIGH (ref 0.0–3.0)
Eosinophils Absolute: 0.1 10*3/uL (ref 0.0–0.7)
Eosinophils Relative: 0.9 % (ref 0.0–5.0)
HCT: 45.6 % (ref 36.0–46.0)
Hemoglobin: 15 g/dL (ref 12.0–15.0)
LYMPHS PCT: 17.6 % (ref 12.0–46.0)
Lymphs Abs: 1.7 10*3/uL (ref 0.7–4.0)
MCHC: 33 g/dL (ref 30.0–36.0)
MCV: 100.1 fl — AB (ref 78.0–100.0)
MONO ABS: 1 10*3/uL (ref 0.1–1.0)
Monocytes Relative: 10.6 % (ref 3.0–12.0)
NEUTROS PCT: 65.7 % (ref 43.0–77.0)
Neutro Abs: 6.3 10*3/uL (ref 1.4–7.7)
PLATELETS: 215 10*3/uL (ref 150.0–400.0)
RBC: 4.55 Mil/uL (ref 3.87–5.11)
RDW: 13.8 % (ref 11.5–15.5)
WBC: 9.6 10*3/uL (ref 4.0–10.5)

## 2017-10-28 LAB — BASIC METABOLIC PANEL
BUN: 10 mg/dL (ref 6–23)
CO2: 31 mEq/L (ref 19–32)
CREATININE: 0.77 mg/dL (ref 0.40–1.20)
Calcium: 9.7 mg/dL (ref 8.4–10.5)
Chloride: 101 mEq/L (ref 96–112)
GFR: 80.85 mL/min (ref >60.00)
Glucose, Bld: 89 mg/dL (ref 70–99)
Potassium: 4.8 mEq/L (ref 3.5–5.1)
Sodium: 140 mEq/L (ref 135–145)

## 2017-10-28 LAB — POC URINALSYSI DIPSTICK (AUTOMATED)
Bilirubin, UA: NEGATIVE
GLUCOSE UA: NEGATIVE
Ketones, UA: NEGATIVE
Leukocytes, UA: NEGATIVE
NITRITE UA: NEGATIVE
Protein, UA: POSITIVE — AB
SPEC GRAV UA: 1.025 (ref 1.010–1.025)
UROBILINOGEN UA: 1 U/dL
pH, UA: 6 (ref 5.0–8.0)

## 2017-10-28 MED ORDER — CEFTRIAXONE SODIUM 1 G IJ SOLR
1.0000 g | Freq: Once | INTRAMUSCULAR | Status: AC
  Administered 2017-10-28: 1 g via INTRAMUSCULAR

## 2017-10-28 MED ORDER — CEPHALEXIN 500 MG PO CAPS: 500.0000 mg | ORAL_CAPSULE | Freq: Four times a day (QID) | ORAL | 0 refills | Status: DC

## 2017-10-28 NOTE — Progress Notes (Signed)
[name] , 13-Jan-1956, 62 y.o., female MRN: [id number] Patient Care Team    Relationship Specialty Notifications Start End  Ma Hillock, DO PCP - General Family Medicine  04/23/15   Volanda Napoleon, MD Consulting Physician Oncology  05/12/13   Maisie Fus, MD Consulting Physician Obstetrics and Gynecology  01/01/15   Chesley Mires, MD Consulting Physician Pulmonary Disease  02/22/16   Carolan Clines, MD Consulting Physician Urology  04/21/16   Ayesha Mohair  Dermatology  07/04/16    Comment: [facility]    Chief Complaint  Patient presents with  . Fever    flank discomfort     Subjective: Pt presents for an OV with complaints of right  flank pain, fever and nausea since June 1 st when she was treated for kidney infection. She reports she was seen at  urgent care for this condition and idagnosed with E.Coli, UTI which was pan-sensitive. She was placed on Macrobid and felt worse after 2 days. Patient had presented with a fever of 102F with that infection. She was then placed on Septra, she is unsure of the dose length. She did finish that abx without full resolution of symptoms. She was seen again for URI and is just finishing a Z-pack (last dose today). She states she is till have low grade fevers Tmax 100.4 F. She is still have mild right flank pain, that is worse with laying flat or "jarring" or herself. She reports normal urine production. She thinks she had a kidney stone once a long time ago, but she passed it and it did not show up on diagnostics. She is on calcium and vitd for osteoporosis. She has a h/o recurrent pyelonephritis in 2014. She reports have a cystoscopy and kidney stone study many years ago and they were normal.  She is a smoker.   Depression screen [facility] 2/9 10/28/2017 08/10/2017  Decreased Interest 0 0  Down, Depressed, Hopeless 0 0  PHQ - 2 Score 0 0    No Known Allergies Social History   Tobacco Use  . Smoking  status: Current Every Day Smoker    Packs/day: 1.00    Years: 29.00    Pack years: 29.00    Types: Cigarettes    Start date: 06/01/1978  . Smokeless tobacco: Never Used  . Tobacco comment: Current 3/4PPD (12/12/16)  Substance Use Topics  . Alcohol use: Yes    Alcohol/week: 4.2 oz    Types: 7 Glasses of wine per week   Past Medical History:  Diagnosis Date  . Basal cell carcinoma   . History of acute bronchitis 03/24/2011  . Lymphadenopathy, abdominal 03/2013   Noted on abd/pelv CT done to eval slow-to-resolve pyelo  . Meniere disorder 01/23/2012  . Osteoporosis 12/2014   Dr. Nori Riis, her GYN, dx'd her  . Recurrent pyelonephritis   . Seasonal allergic rhinitis    Past Surgical History:  Procedure Laterality Date  . biopsy on skin cancer    . DEXA  12/2014   T score spine -2.4, hip -3.5 (Dr. Nori Riis, [facility].  [name] DILATION AND CURETTAGE OF UTERUS  2009   Family History  Problem Relation Age of Onset  . Hypertension Mother   . Glaucoma Mother   . Irritable bowel syndrome Mother   . Arthritis Mother   . Kidney disease Mother   . Diabetes Mother   . Hypertension Father   . Heart disease Father  bypasses  . Cancer Father        sarcoma  . Cancer Maternal Grandmother        breast  . Heart disease Maternal Grandmother        CHF  . Stroke Maternal Grandmother        in his 45's  . Stroke Maternal Grandfather   . Other Paternal Grandmother        hardening of the arteries  . Heart attack Paternal Grandfather   . Colon cancer Paternal Aunt 76  . Stomach cancer Neg Hx    Allergies as of 10/28/2017   No Known Allergies     Medication List        Accurate as of 10/28/17 11:24 AM. Always use your most recent med list.          AEROCHAMBER MV inhaler Use as instructed   albuterol (2.5 MG/3ML) 0.083% nebulizer solution Commonly known as:  PROVENTIL Take 2.5 mg by nebulization every 6 (six) hours as needed for wheezing or shortness of  breath.   VENTOLIN HFA 108 (90 Base) MCG/ACT inhaler Generic drug:  albuterol TAKE 2 PUFFS BY MOUTH EVERY 6 HOURS AS NEEDED FOR WHEEZE OR SHORTNESS OF BREATH   azelastine 0.1 % nasal spray Commonly known as:  ASTELIN Place 1 spray into both nostrils 2 (two) times daily. Use in each nostril as directed   azithromycin 250 MG tablet Commonly known as:  ZITHROMAX Take two tablets on day one. Then take one tablet daily.   benzonatate 100 MG capsule Commonly known as:  TESSALON Take 1 capsule (100 mg total) by mouth 3 (three) times daily as needed for cough.   cephALEXin 500 MG capsule Commonly known as:  KEFLEX Take 1 capsule (500 mg total) by mouth 4 (four) times daily.   Diclofenac Sodium 2 % Soln Place 2 g onto the skin 2 (two) times daily.   gabapentin 100 MG capsule Commonly known as:  NEURONTIN Take 2 capsules (200 mg total) by mouth at bedtime.   montelukast 10 MG tablet Commonly known as:  SINGULAIR Take 1 tablet (10 mg total) by mouth at bedtime.   multivitamin tablet Take 1 tablet by mouth daily.   mupirocin ointment 2 % Commonly known as:  BACTROBAN Apply 1 application topically 2 (two) times daily.   naproxen 500 MG tablet Commonly known as:  NAPROSYN Take 1 tablet (500 mg total) by mouth 2 (two) times daily with a meal.   ondansetron 4 MG disintegrating tablet Commonly known as:  ZOFRAN-ODT TAKE 1 TABLET BY MOUTH EVERY 6-8 HOURS AS NEEDED   SYMBICORT 160-4.5 MCG/ACT inhaler Generic drug:  budesonide-formoterol TAKE 2 PUFFS BY MOUTH TWICE A DAY   tretinoin 0.025 % cream Commonly known as:  RETIN-A TAKE 1 APPLICATION THIN LAYER TO FACE NIGHTLY   Vitamin D (Ergocalciferol) 50000 units Caps capsule Commonly known as:  DRISDOL Take 1 capsule (50,000 Units total) by mouth every 7 (seven) days.       All past medical history, surgical history, allergies, family history, immunizations andmedications were updated in the EMR today and reviewed under the  history and medication portions of their EMR.     ROS: Negative, with the exception of above mentioned in HPI   Objective:  BP 134/82 (BP Location: Right Arm, Patient Position: Sitting, Cuff Size: Normal)   Pulse 79   Temp 99.4 F (37.4 C)   Resp 20   Ht 5\' 5"  (1.651 m)   Wt 108  lb (49 kg)   SpO2 96%   BMI 17.97 kg/m  Body mass index is 17.97 kg/m. Gen: Afebrile. No acute distress. Nontoxic in appearance, well developed, well nourished. Pleasant thin caucasian female.  HENT: AT. [place]. Bilateral TM visualized without erythema or buldging. MMM, no oral lesions. Bilateral nares without erythema or drainage. Throat without erythema or exudates. Mild cough (chronic), no haorseness.  Eyes:Pupils Equal Round Reactive to light, Extraocular movements intact,  Conjunctiva without redness, discharge or icterus. Neck/lymp/endocrine: Supple,no lymphadenopathy CV: RRR no murmur, no edema Chest: CTAB, no wheeze or crackles. Good air movement, normal resp effort.  Abd: Soft. Flat. ND. Mild TTP right flank. BS present. no Masses palpated. No rebound or guarding.  MSK: Mild discomfort with right CVA palpation Skin: no rashes, purpura or petechiae.  Neuro:  Normal gait. PERLA. EOMi. Alert. Oriented x3  No exam data present No results found. Results for orders placed or performed in visit on 10/28/17 (from the past 24 hour(s))  POCT Urinalysis Dipstick (Automated)     Status: Abnormal   Collection Time: 10/28/17 11:09 AM  Result Value Ref Range   Color, UA yellow    Clarity, UA clear    Glucose, UA Negative Negative   Bilirubin, UA negative    Ketones, UA negative    Spec Grav, UA 1.025 1.010 - 1.025   Blood, UA 2+    pH, UA 6.0 5.0 - 8.0   Protein, UA Positive (A) Negative   Urobilinogen, UA 1.0 0.2 or 1.0 E.U./dL   Nitrite, UA negative    Leukocytes, UA Negative Negative    Assessment/Plan: [name] is a 62 y.o. female present for OV for  Flank pain/Pyelonephritis/fever -  Concern for ongoing infection, labs collected today to monitor.  - urine with hematuria, mild flank pain on exam. Lung exam is ok, her normal.  Urine culture sent. IM rocephin today. Keflex QID start tomorrow. Start naproxen BID as well. - hydrate.  - POCT Urinalysis Dipstick (Automated)--> urine culture sent.  - CBC w/Diff - Basic Metabolic Panel (BMET) - cefTRIAXone (ROCEPHIN) injection 1 g - f/u 1 week if not completely resolved, would need to consider imaging study vs referral r/o kidney stone/pathology.    Osteoporosis:  Last dexa 3 years ago with significant osteoporosis at that time- records reviewed. She has not desired to start fosamax per her report, but thinks she should be on something. We briefly discussed options today, including prolia or reclast. Typically insurance will require trial of fosamax or like agent. She will think about it and discuss when she gets her dexa this year (done by gyn).   Reviewed expectations re: course of current medical issues.  Discussed self-management of symptoms.  Outlined signs and symptoms indicating need for more acute intervention.  Patient verbalized understanding and all questions were answered.  Patient received an After-Visit Summary.    Orders Placed This Encounter  Procedures  . CBC w/Diff  . Basic Metabolic Panel (BMET)  . POCT Urinalysis Dipstick (Automated)    > 25 minutes spent with patient, >50% of time spent face to face counseling and coordinating care.    Note is dictated utilizing voice recognition software. Although note has been proof read prior to signing, occasional typographical errors still can be missed. If any questions arise, please do not hesitate to call for verification.   electronically signed by:  Howard Pouch, DO  [place]

## 2017-10-28 NOTE — Patient Instructions (Signed)
Start keflex tomorrow morning (every 6 hours for 7 days).  We will call you with lab results once resulted. We are checking kidney function and if signs of infection since fever and discomfort remains.  Take naproxen for discomfort and fever Drink plenty of water.   Follow up in 1 week if not completely resolved.    Pyelonephritis, Adult Pyelonephritis is a kidney infection. The kidneys are organs that help clean your blood by moving waste out of your blood and into your pee (urine). This infection can happen quickly, or it can last for a long time. In most cases, it clears up with treatment and does not cause other problems. Follow these instructions at home: Medicines  Take over-the-counter and prescription medicines only as told by your doctor.  Take your antibiotic medicine as told by your doctor. Do not stop taking the medicine even if you start to feel better. General instructions  Drink enough fluid to keep your pee clear or pale yellow.  Avoid caffeine, tea, and carbonated drinks.  Pee (urinate) often. Avoid holding in pee for long periods of time.  Pee before and after sex.  After pooping (having a bowel movement), women should wipe from front to back. Use each tissue only once.  Keep all follow-up visits as told by your doctor. This is important. Contact a doctor if:  You do not feel better after 2 days.  Your symptoms get worse.  You have a fever. Get help right away if:  You cannot take your medicine or drink fluids as told.  You have chills and shaking.  You throw up (vomit).  You have very bad pain in your side (flank) or back.  You feel very weak or you pass out (faint). This information is not intended to replace advice given to you by your health care provider. Make sure you discuss any questions you have with your health care provider. Document Released: 05/29/2004 Document Revised: 09/27/2015 Document Reviewed: 08/14/2014 Elsevier Interactive  Patient Education  [employer].

## 2017-10-30 ENCOUNTER — Telehealth: Payer: Self-pay | Admitting: Family Medicine

## 2017-10-30 LAB — URINE CULTURE
MICRO NUMBER: 90764203
Result:: NO GROWTH
SPECIMEN QUALITY: ADEQUATE

## 2017-10-30 NOTE — Telephone Encounter (Signed)
Message left on voice mail for patient to return call. 

## 2017-10-30 NOTE — Telephone Encounter (Signed)
Patient notified and verbalized understanding. Patient will call back to schedule appointment. 

## 2017-10-30 NOTE — Telephone Encounter (Signed)
Please inform patient the following information: Her urine did not grow any bacteria, so hopefully her kidney infection is treated. I do want her to finish the keflex, since she was still having a low grade fever. - her blood work also resulted with normal kidney function and normal WBC, which is reassuring.  - IF her discomfort and fever are not completely resolved after abx, then we should see her back for follow up and investigate further.   - Also, one of her labs were changed from her prior collections. The size of her RBC are larger than normal (just barely). Most common causes for increases  in size are  Vitamin deficiencies, thyroid disorders or increased alcohol consumption. Recommendations she start B complex vitamin, avoid alcohol if she drinks at all and follow up in 3 months in which we will repeat labs (this can be her CPE if she is due).--> please schedule her.

## 2017-11-13 ENCOUNTER — Encounter: Payer: Self-pay | Admitting: Family Medicine

## 2017-11-13 ENCOUNTER — Ambulatory Visit (INDEPENDENT_AMBULATORY_CARE_PROVIDER_SITE_OTHER): Payer: BLUE CROSS/BLUE SHIELD | Admitting: Family Medicine

## 2017-11-13 DIAGNOSIS — S83281D Other tear of lateral meniscus, current injury, right knee, subsequent encounter: Secondary | ICD-10-CM | POA: Diagnosis not present

## 2017-11-13 DIAGNOSIS — M7551 Bursitis of right shoulder: Secondary | ICD-10-CM | POA: Diagnosis not present

## 2017-11-13 NOTE — Patient Instructions (Addendum)
Good to see you  Ice is your friend Ice 20 minutes 2 times daily. Usually after activity and before bed. Injected shoulder as well  I hope it helps Lets do 4000I u daily fo vitamin D over the counter  Make an appointment again in 4-8 weeks just to make sure you are doing fine.

## 2017-11-13 NOTE — Progress Notes (Signed)
[name] [address] [place], [place] [address] Phone: [phone] Subjective:      CC: Knee pain and shoulder pain follow-up  [id number]  [name] is a 62 y.o. female coming in with complaint of knee pain.  Does have medial meniscal tear.  Significantly better though.  No discomfort.  Progress.  ER the other day to work with no significant discomfort  Right shoulder pain.  Seems to be worsening.  Did have a fall initially.  Symptoms feels like she cannot trust the shoulder.  Denies any significant nighttime pain.  Certain movement causes more pain.     Past Medical History:  Diagnosis Date  . Basal cell carcinoma   . History of acute bronchitis 03/24/2011  . Lymphadenopathy, abdominal 03/2013   Noted on abd/pelv CT done to eval slow-to-resolve pyelo  . Meniere disorder 01/23/2012  . Osteoporosis 12/2014   Dr. Nori Riis, her GYN, dx'd her  . Recurrent pyelonephritis   . Seasonal allergic rhinitis    Past Surgical History:  Procedure Laterality Date  . biopsy on skin cancer    . DEXA  12/2014   T score spine -2.4, hip -3.5 (Dr. Nori Riis, [facility].  [name] DILATION AND CURETTAGE OF UTERUS  2009   Social History   Socioeconomic History  . Marital status: Married    Spouse name: Not on file  . Number of children: Not on file  . Years of education: Not on file  . Highest education level: Not on file  Occupational History  . Occupation: housewife  Social Needs  . Financial resource strain: Not on file  . Food insecurity:    Worry: Not on file    Inability: Not on file  . Transportation needs:    Medical: Not on file    Non-medical: Not on file  Tobacco Use  . Smoking status: Current Every Day Smoker    Packs/day: 1.00    Years: 29.00    Pack years: 29.00    Types: Cigarettes    Start date: 06/01/1978  . Smokeless tobacco: Never Used  . Tobacco comment: Current 3/4PPD (12/12/16)  Substance and Sexual Activity  .  Alcohol use: Yes    Alcohol/week: 4.2 oz    Types: 7 Glasses of wine per week  . Drug use: No  . Sexual activity: Yes    Partners: Male  Lifestyle  . Physical activity:    Days per week: Not on file    Minutes per session: Not on file  . Stress: Not on file  Relationships  . Social connections:    Talks on phone: Not on file    Gets together: Not on file    Attends religious service: Not on file    Active member of club or organization: Not on file    Attends meetings of clubs or organizations: Not on file    Relationship status: Not on file  Other Topics Concern  . Not on file  Social History Narrative  . Not on file   No Known Allergies Family History  Problem Relation Age of Onset  . Hypertension Mother   . Glaucoma Mother   . Irritable bowel syndrome Mother   . Arthritis Mother   . Kidney disease Mother   . Diabetes Mother   . Hypertension Father   . Heart disease Father        bypasses  . Cancer Father        sarcoma  .  Cancer Maternal Grandmother        breast  . Heart disease Maternal Grandmother        CHF  . Stroke Maternal Grandmother        in his 68's  . Stroke Maternal Grandfather   . Other Paternal Grandmother        hardening of the arteries  . Heart attack Paternal Grandfather   . Colon cancer Paternal Aunt 73  . Stomach cancer Neg Hx      Past medical history, social, surgical and family history all reviewed in electronic medical record.  No pertanent information unless stated regarding to the chief complaint.   Review of Systems:Review of systems updated and as accurate as of 11/13/17  No headache, visual changes, nausea, vomiting, diarrhea, constipation, dizziness, abdominal pain, skin rash, fevers, chills, night sweats, weight loss, swollen lymph nodes, body aches, joint swelling, muscle aches, chest pain, shortness of breath, mood changes.   Objective  Blood pressure 134/80, pulse 74, resp. rate 16, weight 109 lb (49.4 kg), SpO2 97  %. Systems examined below as of 11/13/17   General: No apparent distress alert and oriented x3 mood and affect normal, dressed appropriately.  HEENT: Pupils equal, extraocular movements intact  Respiratory: Patient's speak in full sentences and does not appear short of breath  Cardiovascular: No lower extremity edema, non tender, no erythema  Skin: Warm dry intact with no signs of infection or rash on extremities or on axial skeleton.  Abdomen: Soft nontender  Neuro: Cranial nerves II through XII are intact, neurovascularly intact in all extremities with 2+ DTRs and 2+ pulses.  Lymph: No lymphadenopathy of posterior or anterior cervical chain or axillae bilaterally.  Gait normal with good balance and coordination.  MSK:  Non tender with full range of motion and good stability and symmetric strength and tone of elbows, wrist, hip, and ankles bilaterally.  Knee: Right Normal to inspection with no erythema or effusion or obvious bony abnormalities. Palpation normal with no warmth, joint line tenderness, patellar tenderness, or condyle tenderness. ROM full in flexion and extension and lower leg rotation. Ligaments with solid consistent endpoints including ACL, PCL, LCL, MCL. Negative Mcmurray's, Apley's, and Thessalonian tests. Non painful patellar compression. Patellar glide without crepitus. Patellar and quadriceps tendons unremarkable. Hamstring and quadriceps strength is normal. Contralateral knee unremarkable  Right shoulder shows positive impingement.  4+ out of 5 rotator cuff strength.  Full range of motion.  Neurovascularly intact distally.  After informed written and verbal consent, patient was seated on exam table. Right knee was prepped with alcohol swab and utilizing anterolateral approach, patient's right knee space was injected with 4:1  marcaine 0.5%: Kenalog 40mg /dL. Patient tolerated the procedure well without immediate complications.    Impression and Recommendations:      This case required medical decision making of moderate complexity.      Note: This dictation was prepared with Dragon dictation along with smaller phrase technology. Any transcriptional errors that result from this process are unintentional.

## 2017-11-13 NOTE — Assessment & Plan Note (Signed)
Stable, improving.  Discussed icing regimen.  Which activities to do which wants to avoid.

## 2017-11-13 NOTE — Assessment & Plan Note (Signed)
Injected.  Tolerated the procedure well.  Discussed icing regimen, is given home exercises.  Discussed which activities of doing which was to avoid.  Differential includes a cervical radiculopathy but less likely.  Follow-up again in 4 to 6 weeks

## 2017-12-14 NOTE — Progress Notes (Signed)
[name] [address], [place] [address] Phone: [phone] Subjective:    I'm seeing this patient by the request  of:    CC: Right shoulder pain follow-up  [id number]  [name] is a 62 y.o. female coming in with complaint of right shoulder pain. States her shoulder is painful with movement.  Found to have some mild bursitis.  Does have known osteoporosis.  Patient is continuing to have some pain and discomfort.  Would state that she is approximately 70% better.     Past Medical History:  Diagnosis Date  . Basal cell carcinoma   . History of acute bronchitis 03/24/2011  . Lymphadenopathy, abdominal 03/2013   Noted on abd/pelv CT done to eval slow-to-resolve pyelo  . Meniere disorder 01/23/2012  . Osteoporosis 12/2014   Dr. Nori Riis, her GYN, dx'd her  . Recurrent pyelonephritis   . Seasonal allergic rhinitis    Past Surgical History:  Procedure Laterality Date  . biopsy on skin cancer    . DEXA  12/2014   T score spine -2.4, hip -3.5 (Dr. Nori Riis, [facility] for [facility].  [name] DILATION AND CURETTAGE OF UTERUS  2009   Social History   Socioeconomic History  . Marital status: Married    Spouse name: Not on file  . Number of children: Not on file  . Years of education: Not on file  . Highest education level: Not on file  Occupational History  . Occupation: housewife  Social Needs  . Financial resource strain: Not on file  . Food insecurity:    Worry: Not on file    Inability: Not on file  . Transportation needs:    Medical: Not on file    Non-medical: Not on file  Tobacco Use  . Smoking status: Current Every Day Smoker    Packs/day: 1.00    Years: 29.00    Pack years: 29.00    Types: Cigarettes    Start date: 06/01/1978  . Smokeless tobacco: Never Used  . Tobacco comment: Current 3/4PPD (12/12/16)  Substance and Sexual Activity  . Alcohol use: Yes    Alcohol/week: 7.0 standard drinks    Types: 7 Glasses of wine  per week  . Drug use: No  . Sexual activity: Yes    Partners: Male  Lifestyle  . Physical activity:    Days per week: Not on file    Minutes per session: Not on file  . Stress: Not on file  Relationships  . Social connections:    Talks on phone: Not on file    Gets together: Not on file    Attends religious service: Not on file    Active member of club or organization: Not on file    Attends meetings of clubs or organizations: Not on file    Relationship status: Not on file  Other Topics Concern  . Not on file  Social History Narrative  . Not on file   No Known Allergies Family History  Problem Relation Age of Onset  . Hypertension Mother   . Glaucoma Mother   . Irritable bowel syndrome Mother   . Arthritis Mother   . Kidney disease Mother   . Diabetes Mother   . Hypertension Father   . Heart disease Father        bypasses  . Cancer Father        sarcoma  . Cancer Maternal Grandmother        breast  .  Heart disease Maternal Grandmother        CHF  . Stroke Maternal Grandmother        in his 72's  . Stroke Maternal Grandfather   . Other Paternal Grandmother        hardening of the arteries  . Heart attack Paternal Grandfather   . Colon cancer Paternal Aunt 56  . Stomach cancer Neg Hx      Past medical history, social, surgical and family history all reviewed in electronic medical record.  No pertanent information unless stated regarding to the chief complaint.   Review of Systems:Review of systems updated and as accurate as of 12/15/17  No headache, visual changes, nausea, vomiting, diarrhea, constipation, dizziness, abdominal pain, skin rash, fevers, chills, night sweats, weight loss, swollen lymph nodes, body aches, joint swelling, chest pain, shortness of breath, mood changes.  Positive muscle aches  Objective  Blood pressure 140/84, pulse 82, height 5\' 5"  (1.651 m), weight 106 lb (48.1 kg), SpO2 96 %. Systems examined below as of 12/15/17   General: No  apparent distress alert and oriented x3 mood and affect normal, dressed appropriately.  HEENT: Pupils equal, extraocular movements intact  Respiratory: Patient's speak in full sentences and does not appear short of breath  Cardiovascular: No lower extremity edema, non tender, no erythema  Skin: Warm dry intact with no signs of infection or rash on extremities or on axial skeleton.  Abdomen: Soft nontender  Neuro: Cranial nerves II through XII are intact, neurovascularly intact in all extremities with 2+ DTRs and 2+ pulses.  Lymph: No lymphadenopathy of posterior or anterior cervical chain or axillae bilaterally.  Gait normal with good balance and coordination.  MSK:  Non tender with full range of motion and good stability and symmetric strength and tone of  elbows, wrist, hip, knee and ankles bilaterally.  Mild arthritic changes Shoulder: Right Inspection reveals no abnormalities, atrophy or asymmetry. Palpation is normal with no tenderness over AC joint or bicipital groove. ROM is full in all planes. Rotator cuff strength normal throughout. Positive impingement Speeds and Yergason's tests normal. Positive O'Brien's Normal scapular function observed. No painful arc and no drop arm sign. No apprehension sign Contralateral shoulder unremarkable     Impression and Recommendations:     This case required medical decision making of moderate complexity.      Note: This dictation was prepared with Dragon dictation along with smaller phrase technology. Any transcriptional errors that result from this process are unintentional.

## 2017-12-15 ENCOUNTER — Encounter: Payer: Self-pay | Admitting: Family Medicine

## 2017-12-15 ENCOUNTER — Ambulatory Visit (INDEPENDENT_AMBULATORY_CARE_PROVIDER_SITE_OTHER): Payer: BLUE CROSS/BLUE SHIELD | Admitting: Family Medicine

## 2017-12-15 ENCOUNTER — Ambulatory Visit (INDEPENDENT_AMBULATORY_CARE_PROVIDER_SITE_OTHER)
Admission: RE | Admit: 2017-12-15 | Discharge: 2017-12-15 | Disposition: A | Discharge status: Home / Self Care | Payer: BLUE CROSS/BLUE SHIELD | Source: Ambulatory Visit | Attending: Family Medicine | Admitting: Family Medicine

## 2017-12-15 VITALS — BP 140/84 | HR 82 | Ht 65.0 in | Wt 106.0 lb

## 2017-12-15 DIAGNOSIS — M25511 Pain in right shoulder: Secondary | ICD-10-CM

## 2017-12-15 DIAGNOSIS — M7551 Bursitis of right shoulder: Secondary | ICD-10-CM

## 2017-12-15 DIAGNOSIS — G8929 Other chronic pain: Secondary | ICD-10-CM

## 2017-12-15 NOTE — Assessment & Plan Note (Signed)
Improved after the injection continues to have some discomfort.  Start formal physical therapy.  With history of potential abnormal CT scan we will also get x-ray of the shoulder to make sure doing okay.  Patient will follow-up with me again in 4 to 6 weeks

## 2017-12-16 ENCOUNTER — Ambulatory Visit (INDEPENDENT_AMBULATORY_CARE_PROVIDER_SITE_OTHER): Payer: BLUE CROSS/BLUE SHIELD | Admitting: Pulmonary Disease

## 2017-12-16 ENCOUNTER — Encounter: Payer: Self-pay | Admitting: Pulmonary Disease

## 2017-12-16 VITALS — BP 122/80 | HR 94 | Ht 65.0 in | Wt 106.2 lb

## 2017-12-16 DIAGNOSIS — J479 Bronchiectasis, uncomplicated: Secondary | ICD-10-CM | POA: Diagnosis not present

## 2017-12-16 DIAGNOSIS — Z720 Tobacco use: Secondary | ICD-10-CM | POA: Diagnosis not present

## 2017-12-16 DIAGNOSIS — J449 Chronic obstructive pulmonary disease, unspecified: Secondary | ICD-10-CM | POA: Diagnosis not present

## 2017-12-16 NOTE — Progress Notes (Signed)
[facility]  Chief Complaint  Patient presents with  . Follow-up    patient states breathing is doing good    Constitutional: BP 122/80 (BP Location: Left Arm, Cuff Size: Normal)   Pulse 94   Ht 5\' 5"  (1.651 m)   Wt 106 lb 3.2 oz (48.2 kg)   SpO2 94%   BMI 17.67 kg/m   History of Present Illness: [name] is a 62 y.o. female smoker with COPD/emphysema, bronchiectasis, and lung nodules.  She tried using bupropion.  Didn't seem to help.  She likes smoking too much and helps with her stress.  She was treated for bronchitis and kidney infection in June.  Also seen by sports medicine for knee and shoulder injuries.  Doing better from these.  She gets occasional cough with green sputum.  Has occasional wheeze especially when sweeping dusty areas.  Not having fever, chest pain, hemoptysis, sinus congestion, sore throat, skin rash, or leg swelling.  Uses mucinex intermittently.     Comprehensive Respiratory Exam:  Appearance - well kempt, thin ENMT - nasal mucosa moist, turbinates clear, midline nasal septum, no dental lesions, no gingival bleeding, no oral exudates, no tonsillar hypertrophy Neck - no masses, trachea midline, no thyromegaly, no elevation in JVP Respiratory - normal appearance of chest wall, normal respiratory effort w/o accessory muscle use, no dullness on percussion, no wheezing or rales CV - s1s2 regular rate and rhythm, no murmurs, no peripheral edema, no varicosities, radial pulses symmetric GI - soft, non tender, no masses, no hepatosplenomegaly Lymph - no adenopathy noted in neck and axillary areas MSK - normal muscle strength and tone, normal gait Ext - no cyanosis, clubbing, or joint inflammation noted Skin - no rashes, lesions, or ulcers Neuro - oriented to person, place, and time Psych - normal mood and affect   Assessment/Plan:  COPD with chronic bronchitis, emphysema and mild bronchiectasis. - continue  symbicort, singulair and prn albuterol - discussed symptoms to monitor for that would indicate that she is having a flare and needs ABX/prednisone  Tobacco abuse. - reviewed options to assist with smoking cessation - she is considering another try with bupropion  Allergic rhinitis. -- astelin, singulair   She has requested that health information not be shared with her daughter without her prior approval.   Patient Instructions  Follow up in 6 months   Chesley Mires, MD [facility] 12/16/2017, 2:36 PM Pager:  [phone]  Flow Sheet  Pulmonary tests: PFT 12/15/08 FEV1 2.20(90%), FVC 3.68(113%), FEV1% 60, TLC 6.54(132%), DLCO 75%, no BD response Sputum culture 06/27/16 >> Moraxella catarrhalis PFT 08/25/16 >> FEV1 1.55 (59%), FEV1% 57, TLC 5.33 (103%), DLCO 52%  Chest imaging: CT chest 04/21/16 >> borderline LAN, nodules in lung bases with tree in bud CT chest 10/22/16 >> moderate centrilobular emphysema, 1.2 cm nodule LUL, tree in bud, RLL BTX CT chest 01/26/17 >> moderate centrilobular emphysema, mild b/l BTX with tree in bud, LUL nodule resolved, stable 7 mm RLL nodule since 2015  Past Medical History: She  has a past medical history of Basal cell carcinoma, History of acute bronchitis (03/24/2011), Lymphadenopathy, abdominal (03/2013), Meniere disorder (01/23/2012), Osteoporosis (12/2014), Recurrent pyelonephritis, and Seasonal allergic rhinitis.  Past Surgical History: She  has a past surgical history that includes biopsy on skin cancer; Dilation and curettage of uterus (2009); and DEXA (12/2014).  Family History: Her family history includes Arthritis in her mother; Cancer in her father and maternal grandmother; Colon cancer (  age of onset: 37) in her paternal aunt; Diabetes in her mother; Glaucoma in her mother; Heart attack in her paternal grandfather; Heart disease in her father and maternal grandmother; Hypertension in her father and mother; Irritable bowel  syndrome in her mother; Kidney disease in her mother; Other in her paternal grandmother; Stroke in her maternal grandfather and maternal grandmother.  Social History: She  reports that she has been smoking cigarettes. She started smoking about 39 years ago. She has a 29.00 pack-year smoking history. She has never used smokeless tobacco. She reports that she drinks about 7.0 standard drinks of alcohol per week. She reports that she does not use drugs.  Medications: Allergies as of 12/16/2017   No Known Allergies     Medication List        Accurate as of 12/16/17  2:36 PM. Always use your most recent med list.          AEROCHAMBER MV inhaler Use as instructed   albuterol (2.5 MG/3ML) 0.083% nebulizer solution Commonly known as:  PROVENTIL Take 2.5 mg by nebulization every 6 (six) hours as needed for wheezing or shortness of breath.   VENTOLIN HFA 108 (90 Base) MCG/ACT inhaler Generic drug:  albuterol TAKE 2 PUFFS BY MOUTH EVERY 6 HOURS AS NEEDED FOR WHEEZE OR SHORTNESS OF BREATH   azelastine 0.1 % nasal spray Commonly known as:  ASTELIN Place 1 spray into both nostrils 2 (two) times daily. Use in each nostril as directed   Diclofenac Sodium 2 % Soln Place 2 g onto the skin 2 (two) times daily.   gabapentin 100 MG capsule Commonly known as:  NEURONTIN Take 2 capsules (200 mg total) by mouth at bedtime.   montelukast 10 MG tablet Commonly known as:  SINGULAIR Take 1 tablet (10 mg total) by mouth at bedtime.   multivitamin tablet Take 1 tablet by mouth daily.   SYMBICORT 160-4.5 MCG/ACT inhaler Generic drug:  budesonide-formoterol TAKE 2 PUFFS BY MOUTH TWICE A DAY   tretinoin 0.025 % cream Commonly known as:  RETIN-A TAKE 1 APPLICATION THIN LAYER TO FACE NIGHTLY   Vitamin D (Ergocalciferol) 50000 units Caps capsule Commonly known as:  DRISDOL Take 1 capsule (50,000 Units total) by mouth every 7 (seven) days.

## 2017-12-16 NOTE — Patient Instructions (Signed)
Follow up in 6 months 

## 2017-12-22 IMAGING — DX DG CHEST 2V
2 series · 2 of 2 positions shown · non-contrast
Comparison: 06/30/2016, 06/11/2016

CLINICAL DATA: 60-year-old female with a history of prior
pneumonia.

EXAM:
CHEST  2 VIEW

[chest pa]
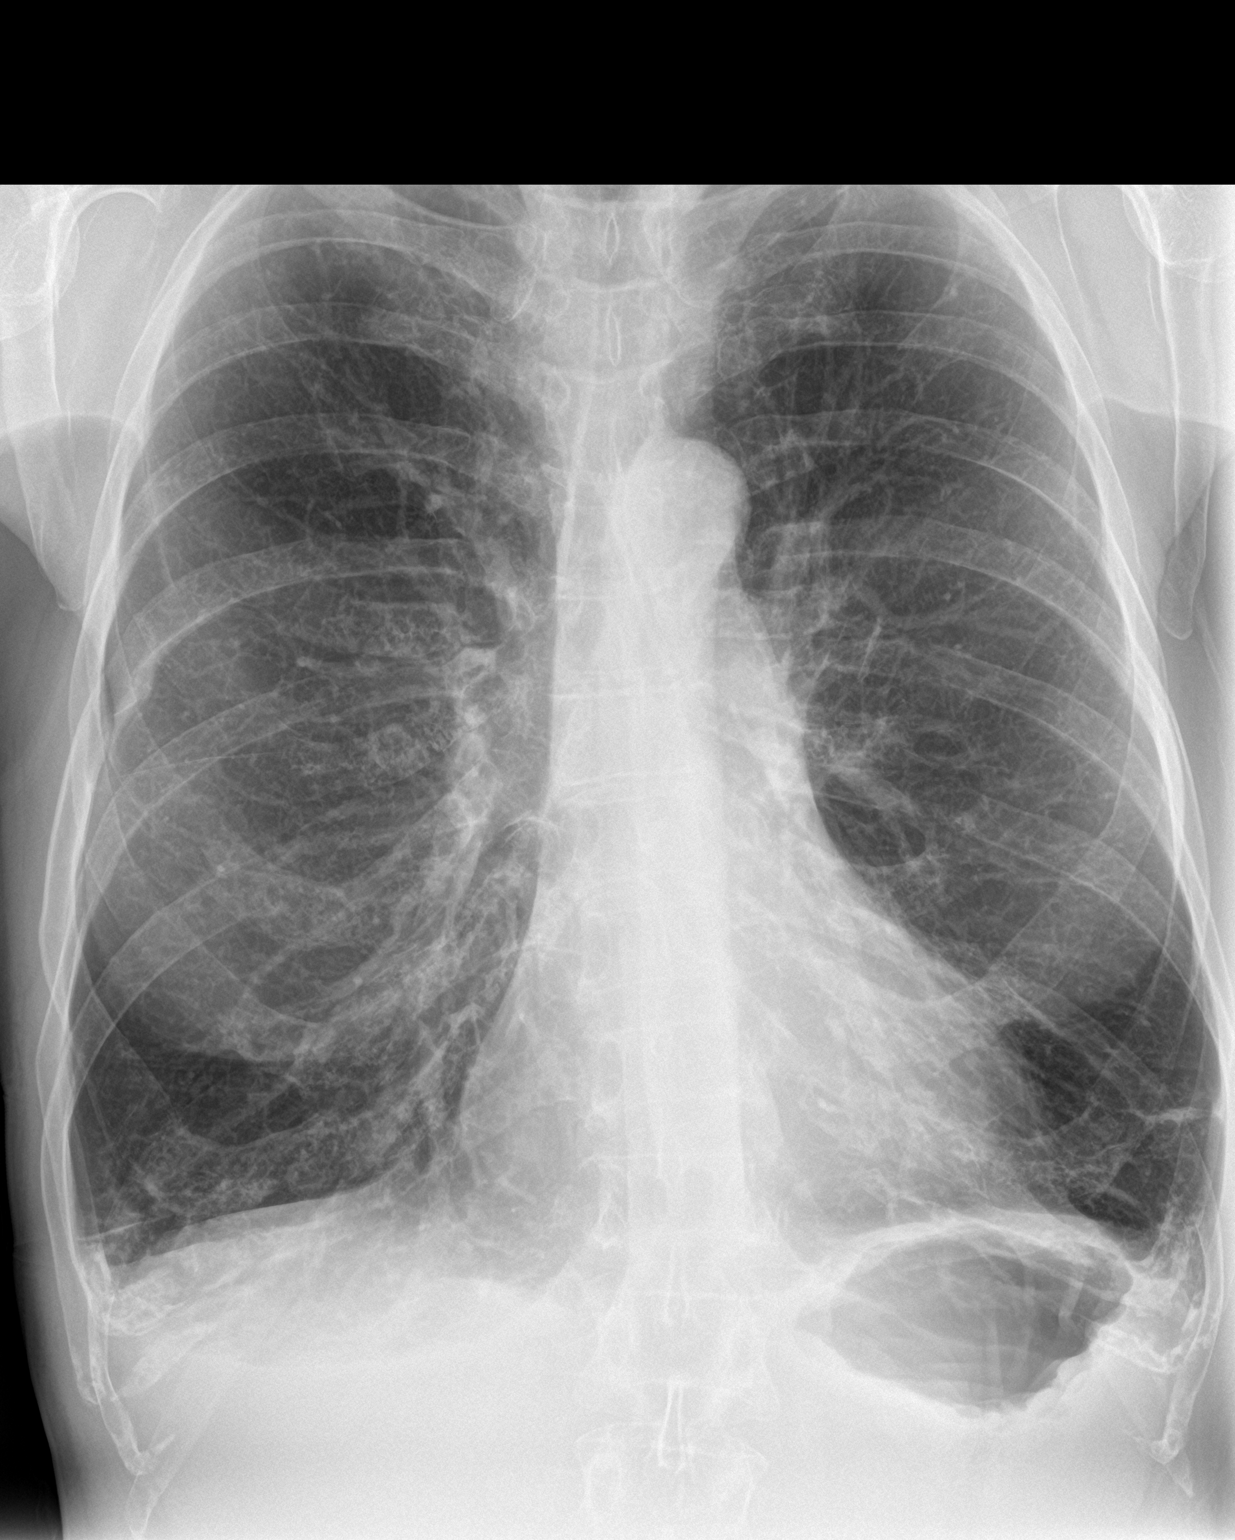

[chest lat]
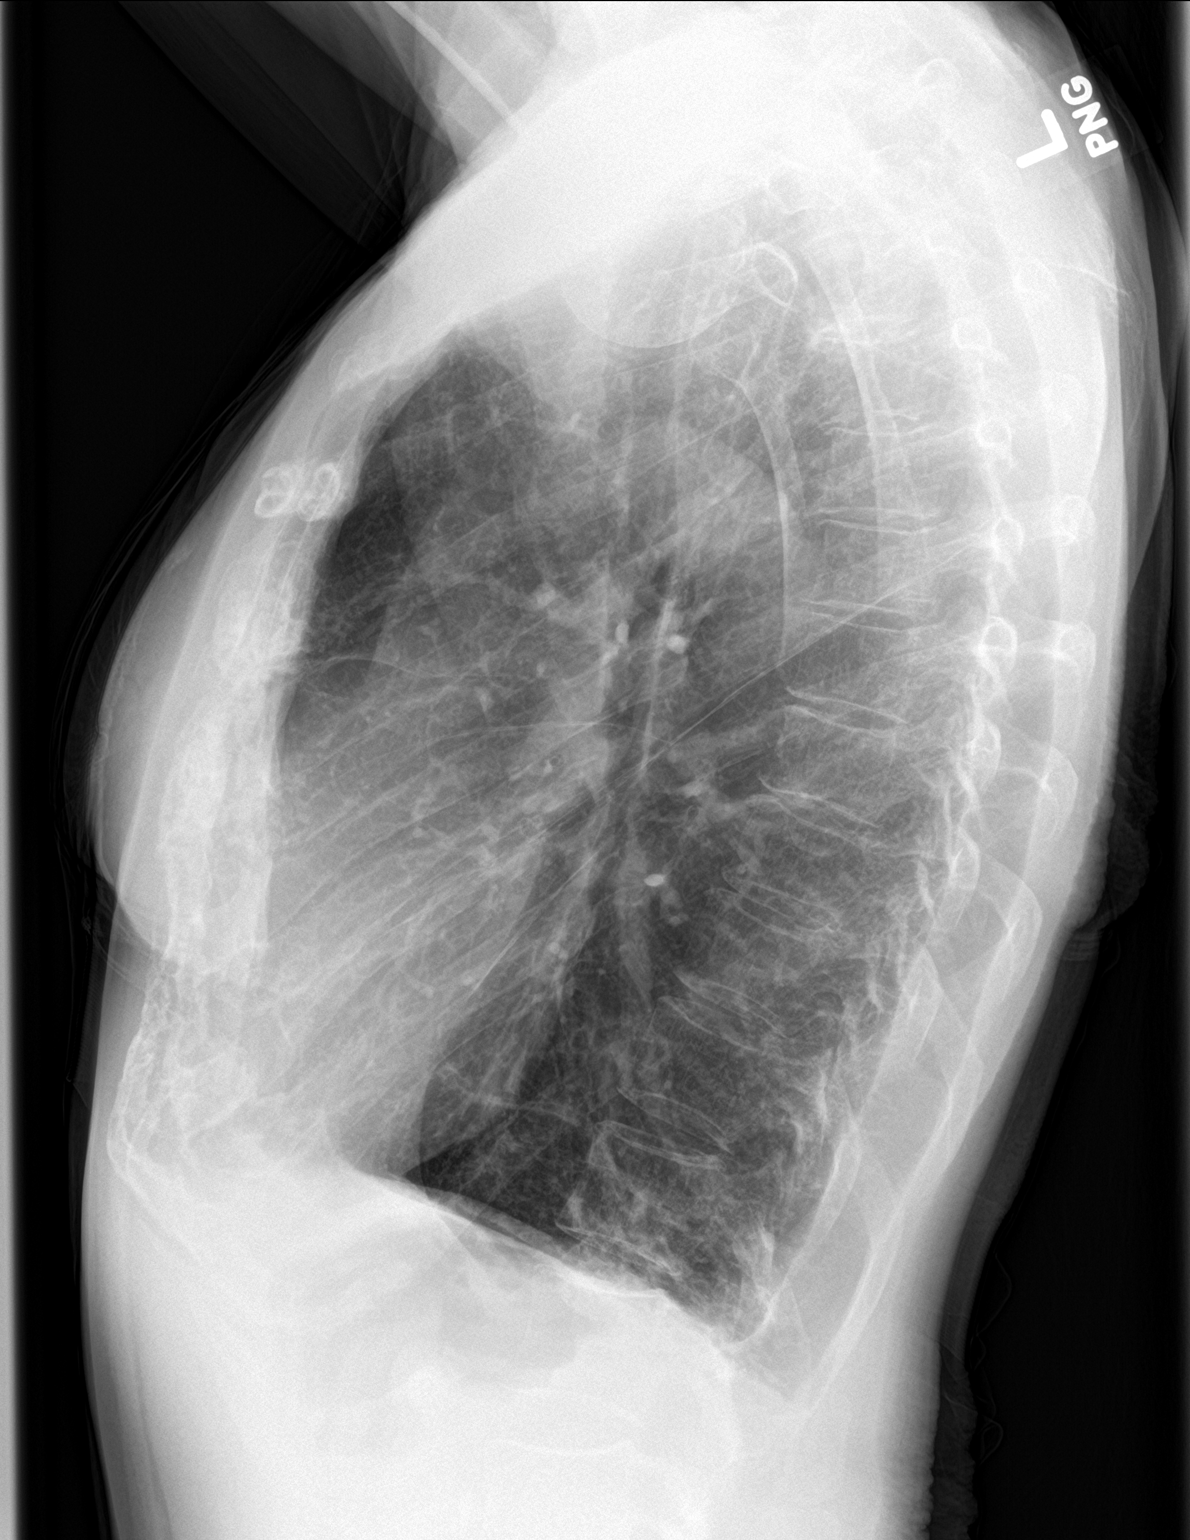

[2 of 2 positions shown; findings below may reference images not displayed]

FINDINGS: Cardiomediastinal silhouette unchanged in size and contour.
Calcifications of the aortic arch.

Stigmata of emphysema, with increased retrosternal airspace,
flattened hemidiaphragms, increased AP diameter, and hyperinflation
on the AP view.

Compared to the plain film of 06/11/2016 and 06/30/2016, there is
near complete resolution of airspace disease at the posterior base,
left side. No pleural effusion. No pneumothorax. No confluent
airspace disease.

No displaced fracture.
IMPRESSION: Resolution of previous airspace disease at the left base.

Emphysema.

Aortic atherosclerosis.

## 2017-12-23 ENCOUNTER — Telehealth: Payer: Self-pay | Admitting: Pulmonary Disease

## 2017-12-23 MED ORDER — AMOXICILLIN-POT CLAVULANATE 875-125 MG PO TABS: 1.0000 | ORAL_TABLET | Freq: Two times a day (BID) | ORAL | 0 refills | Status: DC

## 2017-12-23 MED ORDER — PREDNISONE 10 MG PO TABS: ORAL_TABLET | ORAL | 0 refills | Status: DC

## 2017-12-23 NOTE — Telephone Encounter (Signed)
Can send script for augmentin 875 one pill bid, #20 with no refills  Can send script for prednisone 10 mg pill >> 3 pills daily for 2 days, 2 pills daily for 2 days, 1 pill daily for 2 days.  #12 with no refills   She should call back if not feeling better after few days.

## 2017-12-23 NOTE — Telephone Encounter (Signed)
Spoke with pt. She is aware of VS's recommendations. Rxs have been sent in. Nothing further was needed. 

## 2017-12-23 NOTE — Telephone Encounter (Signed)
Spoke with pt. States that she is not feeling well. Reports head congestion, cough, fever, wheezing and chest tightness. Cough is producing thick, yellow mucus. Temp is at 101.2. Symptoms started last week and have progressively gotten worse. Would like to have something sent in.  VS - please advise. Thanks.

## 2018-01-09 ENCOUNTER — Other Ambulatory Visit: Payer: Self-pay | Admitting: Family Medicine

## 2018-01-11 NOTE — Telephone Encounter (Signed)
Refill done.  

## 2018-01-11 NOTE — Progress Notes (Signed)
[name] [address] [place], [place] [address] Phone: [phone] Subjective:   [name], am serving as a scribe for Dr. Hulan Saas.  I'm seeing this patient by the request  of:    CC: Back pain and right shoulder pain follow-up  [id number]  [name] is a 62 y.o. female coming in with complaint of right shoulder pain. She has not been going to physical therapy yet. Pain with flexion and when driving she has pain if she is resting her arm on the arm rest. She has had some relief from OMT.  Patient has been noncompliant with the conservative therapy at this time.  Has responded well to manipulation in the past.     Past Medical History:  Diagnosis Date  . Basal cell carcinoma   . History of acute bronchitis 03/24/2011  . Lymphadenopathy, abdominal 03/2013   Noted on abd/pelv CT done to eval slow-to-resolve pyelo  . Meniere disorder 01/23/2012  . Osteoporosis 12/2014   Dr. Nori Riis, her GYN, dx'd her  . Recurrent pyelonephritis   . Seasonal allergic rhinitis    Past Surgical History:  Procedure Laterality Date  . biopsy on skin cancer    . DEXA  12/2014   T score spine -2.4, hip -3.5 (Dr. Nori Riis, [facility] for [facility].  [name] DILATION AND CURETTAGE OF UTERUS  2009   Social History   Socioeconomic History  . Marital status: Married    Spouse name: Not on file  . Number of children: Not on file  . Years of education: Not on file  . Highest education level: Not on file  Occupational History  . Occupation: housewife  Social Needs  . Financial resource strain: Not on file  . Food insecurity:    Worry: Not on file    Inability: Not on file  . Transportation needs:    Medical: Not on file    Non-medical: Not on file  Tobacco Use  . Smoking status: Current Every Day Smoker    Packs/day: 1.00    Years: 29.00    Pack years: 29.00    Types: Cigarettes    Start date: 06/01/1978  . Smokeless tobacco: Never Used  .  Tobacco comment: Current 3/4PPD (12/12/16)  Substance and Sexual Activity  . Alcohol use: Yes    Alcohol/week: 7.0 standard drinks    Types: 7 Glasses of wine per week  . Drug use: [name]  . Sexual activity: Yes    Partners: Male  Lifestyle  . Physical activity:    Days per week: Not on file    Minutes per session: Not on file  . Stress: Not on file  Relationships  . Social connections:    Talks on phone: Not on file    Gets together: Not on file    Attends religious service: Not on file    Active member of club or organization: Not on file    Attends meetings of clubs or organizations: Not on file    Relationship status: Not on file  Other Topics Concern  . Not on file  Social History Narrative  . Not on file   [name] Known Allergies Family History  Problem Relation Age of Onset  . Hypertension Mother   . Glaucoma Mother   . Irritable bowel syndrome Mother   . Arthritis Mother   . Kidney disease Mother   . Diabetes Mother   . Hypertension Father   . Heart disease Father  bypasses  . Cancer Father        sarcoma  . Cancer Maternal Grandmother        breast  . Heart disease Maternal Grandmother        CHF  . Stroke Maternal Grandmother        in his 107's  . Stroke Maternal Grandfather   . Other Paternal Grandmother        hardening of the arteries  . Heart attack Paternal Grandfather   . Colon cancer Paternal Aunt 78  . Stomach cancer Neg Hx       Current Outpatient Medications (Respiratory):  .  albuterol (PROVENTIL) (2.5 MG/3ML) 0.083% nebulizer solution, Take 2.5 mg by nebulization every 6 (six) hours as needed for wheezing or shortness of breath. [name]  azelastine (ASTELIN) 0.1 % nasal spray, Place 1 spray into both nostrils 2 (two) times daily. Use in each nostril as directed .  montelukast (SINGULAIR) 10 MG tablet, Take 1 tablet (10 mg total) by mouth at bedtime. .  SYMBICORT 160-4.5 MCG/ACT inhaler, TAKE 2 PUFFS BY MOUTH TWICE A DAY .  VENTOLIN HFA 108 (90  Base) MCG/ACT inhaler, TAKE 2 PUFFS BY MOUTH EVERY 6 HOURS AS NEEDED FOR WHEEZE OR SHORTNESS OF BREATH    Current Outpatient Medications (Other):  [name]  Multiple Vitamin (MULTIVITAMIN) tablet, Take 1 tablet by mouth daily. [name]  Spacer/Aero-Holding Chambers (AEROCHAMBER MV) inhaler, Use as instructed .  tretinoin (RETIN-A) 0.025 % cream, TAKE 1 APPLICATION THIN LAYER TO FACE NIGHTLY .  VITAMIN D, CHOLECALCIFEROL, PO, Take by mouth.    Past medical history, social, surgical and family history all reviewed in electronic medical record.  [name] pertanent information unless stated regarding to the chief complaint.   Review of Systems:  [name] headache, visual changes, nausea, vomiting, diarrhea, constipation, dizziness, abdominal pain, skin rash, fevers, chills, night sweats, weight loss, swollen lymph nodes, joint swelling,  chest pain, shortness of breath, mood changes.  Positive muscle aches, body aches  Objective  Blood pressure 128/86, pulse 80, height 5\' 5"  (1.651 m), weight 105 lb (47.6 kg), SpO2 95 %.    General: [name] apparent distress alert and oriented x3 mood and affect normal, dressed appropriately.  HEENT: Pupils equal, extraocular movements intact  Respiratory: Patient's speak in full sentences and does not appear short of breath patient was coughing though. Cardiovascular: [name] lower extremity edema, non tender, [name] erythema  Skin: Warm dry intact with [name] signs of infection or rash on extremities or on axial skeleton.  Abdomen: Soft nontender  Neuro: Cranial nerves II through XII are intact, neurovascularly intact in all extremities with 2+ DTRs and 2+ pulses.  Lymph: [name] lymphadenopathy of posterior or anterior cervical chain or axillae bilaterally.  Gait normal with good balance and coordination.  MSK:  Non tender with full range of motion and good stability and symmetric strength and tone of  elbows, wrist, hip, knee and ankles bilaterally.  Right shoulder exam shows that patient still has  some impingement noted.  Positive O'Brien's. Back Exam:  Inspection: Loss of lordosis mild degenerative scoliosis Motion: Flexion 40 deg, Extension 45 deg, Side Bending to 35 deg bilaterally,  Rotation to 35 deg bilaterally  SLR laying: Negative  XSLR laying: Negative  Palpable tenderness: Tender to palpation the paraspinal musculature.[name] FABER: Tightness bilaterally. Sensory change: Gross sensation intact to all lumbar and sacral dermatomes.  Reflexes: 2+ at both patellar tendons, 2+ at achilles tendons, Babinski's downgoing.  Strength at foot  Plantar-flexion: 5/5 Dorsi-flexion:  5/5 Eversion: 5/5 Inversion: 5/5  Leg strength  Quad: 5/5 Hamstring: 5/5 Hip flexor: 5/5 Hip abductors: 4/5 but symmetric Gait unremarkable.  Osteopathic findings C2 flexed rotated and side bent right C4 flexed rotated and side bent left T3 extended rotated and side bent right inhaled third rib T9 extended rotated and side bent left L3 flexed rotated and side bent right Sacrum right on right     Impression and Recommendations:     This case required medical decision making of moderate complexity. The above documentation has been reviewed and is accurate and complete Lyndal Pulley, DO       Note: This dictation was prepared with Dragon dictation along with smaller phrase technology. Any transcriptional errors that result from this process are unintentional.

## 2018-01-12 ENCOUNTER — Ambulatory Visit (INDEPENDENT_AMBULATORY_CARE_PROVIDER_SITE_OTHER): Payer: BLUE CROSS/BLUE SHIELD | Admitting: Family Medicine

## 2018-01-12 ENCOUNTER — Encounter: Payer: Self-pay | Admitting: Family Medicine

## 2018-01-12 ENCOUNTER — Ambulatory Visit (INDEPENDENT_AMBULATORY_CARE_PROVIDER_SITE_OTHER)
Admission: RE | Admit: 2018-01-12 | Discharge: 2018-01-12 | Disposition: A | Discharge status: Home / Self Care | Payer: BLUE CROSS/BLUE SHIELD | Source: Ambulatory Visit | Attending: Family Medicine | Admitting: Family Medicine

## 2018-01-12 VITALS — BP 128/86 | HR 80 | Ht 65.0 in | Wt 105.0 lb

## 2018-01-12 DIAGNOSIS — R079 Chest pain, unspecified: Secondary | ICD-10-CM | POA: Diagnosis not present

## 2018-01-12 DIAGNOSIS — M25511 Pain in right shoulder: Secondary | ICD-10-CM

## 2018-01-12 DIAGNOSIS — M999 Biomechanical lesion, unspecified: Secondary | ICD-10-CM

## 2018-01-12 DIAGNOSIS — M5441 Lumbago with sciatica, right side: Secondary | ICD-10-CM | POA: Diagnosis not present

## 2018-01-12 DIAGNOSIS — G8929 Other chronic pain: Secondary | ICD-10-CM | POA: Diagnosis not present

## 2018-01-12 DIAGNOSIS — M5442 Lumbago with sciatica, left side: Secondary | ICD-10-CM

## 2018-01-12 NOTE — Assessment & Plan Note (Signed)
Decision today to treat with OMT was based on Physical Exam  After verbal consent patient was treated with HVLA, ME, FPR techniques in cervical, thoracic, rib, lumbar and sacral areas  Patient tolerated the procedure well with improvement in symptoms  Patient given exercises, stretches and lifestyle modifications  See medications in patient instructions if given  Patient will follow up in 4-6 weeks 

## 2018-01-12 NOTE — Patient Instructions (Signed)
Good to see you  Ice is your friend  Get in to PT  Chest xray  See me again in 6ish weeks

## 2018-01-12 NOTE — Assessment & Plan Note (Signed)
Continues to have back pain.  Has responded to osteopathic manipulation in the past.  Discussed icing regimen and home exercises.  Discussed core stability.  Discussed protein supplementations.  Patient has had significant amount of lung disease and was coughing a little bit more today.  Patient will have chest x-ray follow-up with me again in 4 weeks

## 2018-02-02 DIAGNOSIS — M25511 Pain in right shoulder: Secondary | ICD-10-CM | POA: Diagnosis not present

## 2018-02-10 DIAGNOSIS — M25511 Pain in right shoulder: Secondary | ICD-10-CM | POA: Diagnosis not present

## 2018-02-15 DIAGNOSIS — M25511 Pain in right shoulder: Secondary | ICD-10-CM | POA: Diagnosis not present

## 2018-02-23 NOTE — Progress Notes (Signed)
[name] [address] [place], [place] [address] Phone: [phone] Subjective:    I Kandace Blitz am serving as a Education administrator for Dr. Hulan Saas.   CC: Shoulder pain, back pain  HUT:[id number]  [name] is a 62 y.o. female coming in with complaint of shoulder pain. Shoulder is doing better. Has been attending PT. Patient overall feels like she has made some improvement.  Still has some discomfort on a daily basis.  What stops her from activity.  Still finds that posture exercises to be difficult.  Tries to catch herself from doing the same repetitive motions but unfortunately finds her self doing them on a daily basis.     Past Medical History:  Diagnosis Date  . Basal cell carcinoma   . History of acute bronchitis 03/24/2011  . Lymphadenopathy, abdominal 03/2013   Noted on abd/pelv CT done to eval slow-to-resolve pyelo  . Meniere disorder 01/23/2012  . Osteoporosis 12/2014   Dr. Nori Riis, her GYN, dx'd her  . Recurrent pyelonephritis   . Seasonal allergic rhinitis    Past Surgical History:  Procedure Laterality Date  . biopsy on skin cancer    . DEXA  12/2014   T score spine -2.4, hip -3.5 (Dr. Nori Riis, [facility].  [name] DILATION AND CURETTAGE OF UTERUS  2009   Social History   Socioeconomic History  . Marital status: Married    Spouse name: Not on file  . Number of children: Not on file  . Years of education: Not on file  . Highest education level: Not on file  Occupational History  . Occupation: housewife  Social Needs  . Financial resource strain: Not on file  . Food insecurity:    Worry: Not on file    Inability: Not on file  . Transportation needs:    Medical: Not on file    Non-medical: Not on file  Tobacco Use  . Smoking status: Current Every Day Smoker    Packs/day: 1.00    Years: 29.00    Pack years: 29.00    Types: Cigarettes    Start date: 06/01/1978  . Smokeless tobacco: Never Used  . Tobacco  comment: Current 3/4PPD (12/12/16)  Substance and Sexual Activity  . Alcohol use: Yes    Alcohol/week: 7.0 standard drinks    Types: 7 Glasses of wine per week  . Drug use: No  . Sexual activity: Yes    Partners: Male  Lifestyle  . Physical activity:    Days per week: Not on file    Minutes per session: Not on file  . Stress: Not on file  Relationships  . Social connections:    Talks on phone: Not on file    Gets together: Not on file    Attends religious service: Not on file    Active member of club or organization: Not on file    Attends meetings of clubs or organizations: Not on file    Relationship status: Not on file  Other Topics Concern  . Not on file  Social History Narrative  . Not on file   No Known Allergies Family History  Problem Relation Age of Onset  . Hypertension Mother   . Glaucoma Mother   . Irritable bowel syndrome Mother   . Arthritis Mother   . Kidney disease Mother   . Diabetes Mother   . Hypertension Father   . Heart disease Father        bypasses  .  Cancer Father        sarcoma  . Cancer Maternal Grandmother        breast  . Heart disease Maternal Grandmother        CHF  . Stroke Maternal Grandmother        in his 43's  . Stroke Maternal Grandfather   . Other Paternal Grandmother        hardening of the arteries  . Heart attack Paternal Grandfather   . Colon cancer Paternal Aunt 66  . Stomach cancer Neg Hx       Current Outpatient Medications (Respiratory):  .  albuterol (PROVENTIL) (2.5 MG/3ML) 0.083% nebulizer solution, Take 2.5 mg by nebulization every 6 (six) hours as needed for wheezing or shortness of breath. [name]  azelastine (ASTELIN) 0.1 % nasal spray, Place 1 spray into both nostrils 2 (two) times daily. Use in each nostril as directed .  montelukast (SINGULAIR) 10 MG tablet, Take 1 tablet (10 mg total) by mouth at bedtime. .  SYMBICORT 160-4.5 MCG/ACT inhaler, TAKE 2 PUFFS BY MOUTH TWICE A DAY .  VENTOLIN HFA 108 (90 Base)  MCG/ACT inhaler, TAKE 2 PUFFS BY MOUTH EVERY 6 HOURS AS NEEDED FOR WHEEZE OR SHORTNESS OF BREATH    Current Outpatient Medications (Other):  [name]  Multiple Vitamin (MULTIVITAMIN) tablet, Take 1 tablet by mouth daily. [name]  Spacer/Aero-Holding Chambers (AEROCHAMBER MV) inhaler, Use as instructed .  tretinoin (RETIN-A) 0.025 % cream, TAKE 1 APPLICATION THIN LAYER TO FACE NIGHTLY .  VITAMIN D, CHOLECALCIFEROL, PO, Take by mouth.    Past medical history, social, surgical and family history all reviewed in electronic medical record.  No pertanent information unless stated regarding to the chief complaint.   Review of Systems:  No headache, visual changes, nausea, vomiting, diarrhea, constipation, dizziness, abdominal pain, skin rash, fevers, chills, night sweats, weight loss, swollen lymph nodes, body aches, joint swelling,  chest pain, shortness of breath, mood changes.  Positive muscle aches  Objective  Blood pressure (!) 150/80, pulse 71, height 5\' 5"  (1.651 m), weight 108 lb (49 kg), SpO2 95 %.   General: No apparent distress alert and oriented x3 mood and affect normal, dressed appropriately.  HEENT: Pupils equal, extraocular movements intact  Respiratory: Patient's speak in full sentences and does not appear short of breath  Cardiovascular: No lower extremity edema, non tender, no erythema  Skin: Warm dry intact with no signs of infection or rash on extremities or on axial skeleton.  Abdomen: Soft nontender  Neuro: Cranial nerves II through XII are intact, neurovascularly intact in all extremities with 2+ DTRs and 2+ pulses.  Lymph: No lymphadenopathy of posterior or anterior cervical chain or axillae bilaterally.  Gait antalgic MSK:  tender with full range of motion and good stability and symmetric strength and tone of , elbows, wrist, hip, knee and ankles bilaterally.   Right shoulder shows very mild impingement contralateral shoulder unremarkable Neck: Inspection mild loss of  lordosis. No palpable stepoffs. Negative Spurling's maneuver. Full neck range of motion Grip strength and sensation normal in bilateral hands Strength good C4 to T1 distribution No sensory change to C4 to T1 Negative Hoffman sign bilaterally Reflexes normal Tightness in the trapezius bilaterally  Back Exam:  Inspection: Unremarkable  Motion: Flexion 45 deg, Extension 20 deg, Side Bending to 35 deg bilaterally,  Rotation to 45 deg bilaterally  SLR laying: Negative  XSLR laying: Negative  Palpable tenderness: Tender to palpation the paraspinal musculature.[name] FABER: Tightness.  Noted. Sensory change:  Gross sensation intact to all lumbar and sacral dermatomes.  Reflexes: 2+ at both patellar tendons, 2+ at achilles tendons, Babinski's downgoing.  Strength at foot  Plantar-flexion: 5/5 Dorsi-flexion: 5/5 Eversion: 5/5 Inversion: 5/5  Leg strength  Quad: 5/5 Hamstring: 5/5 Hip flexor: 5/5 Hip abductors: 5/5  Gait unremarkable.   Osteopathic findings  C2 flexed rotated and side bent right C4 flexed rotated and side bent left 76 flexed rotated and side bent right T3 extended rotated and side bent right inhaled third rib T5 extended rotated and side bent left L2 flexed rotated and side bent right Sacrum right on right        Impression and Recommendations:     This case required medical decision making of moderate complexity. The above documentation has been reviewed and is accurate and complete Lyndal Pulley, DO       Note: This dictation was prepared with Dragon dictation along with smaller phrase technology. Any transcriptional errors that result from this process are unintentional.

## 2018-02-24 ENCOUNTER — Encounter: Payer: Self-pay | Admitting: Family Medicine

## 2018-02-24 ENCOUNTER — Ambulatory Visit (INDEPENDENT_AMBULATORY_CARE_PROVIDER_SITE_OTHER): Payer: BLUE CROSS/BLUE SHIELD | Admitting: Family Medicine

## 2018-02-24 VITALS — BP 150/80 | HR 71 | Ht 65.0 in | Wt 108.0 lb

## 2018-02-24 DIAGNOSIS — M999 Biomechanical lesion, unspecified: Secondary | ICD-10-CM

## 2018-02-24 DIAGNOSIS — M5442 Lumbago with sciatica, left side: Secondary | ICD-10-CM

## 2018-02-24 DIAGNOSIS — M5441 Lumbago with sciatica, right side: Secondary | ICD-10-CM

## 2018-02-24 DIAGNOSIS — G8929 Other chronic pain: Secondary | ICD-10-CM | POA: Diagnosis not present

## 2018-02-24 NOTE — Assessment & Plan Note (Signed)
Low back pain.  Likely multifactorial.  We continue to monitor.  Patient is responding to manipulation.  Discussed the posture and ergonomics.  Discussed potentially increasing protein as well.  Follow-up again in 4 to 8 weeks

## 2018-02-24 NOTE — Assessment & Plan Note (Signed)
Decision today to treat with OMT was based on Physical Exam  After verbal consent patient was treated with HVLA, ME, FPR techniques in cervical, thoracic, rib lumbar and sacral areas  Patient tolerated the procedure well with improvement in symptoms  Patient given exercises, stretches and lifestyle modifications  See medications in patient instructions if given  Patient will follow up in 4-8 weeks 

## 2018-02-24 NOTE — Patient Instructions (Signed)
Good to see you  Ice is yourr friend Stay active Try to watch posture Read about PRP Spinal cord stimulator for mom could be good Look into KT tape for you to help remind you of posture See me again in 6ish weeks!

## 2018-03-03 ENCOUNTER — Other Ambulatory Visit: Payer: Self-pay | Admitting: Pulmonary Disease

## 2018-03-10 DIAGNOSIS — C44729 Squamous cell carcinoma of skin of left lower limb, including hip: Secondary | ICD-10-CM | POA: Diagnosis not present

## 2018-03-10 DIAGNOSIS — D485 Neoplasm of uncertain behavior of skin: Secondary | ICD-10-CM | POA: Diagnosis not present

## 2018-03-31 ENCOUNTER — Telehealth: Payer: Self-pay | Admitting: Pulmonary Disease

## 2018-03-31 MED ORDER — DOXYCYCLINE HYCLATE 100 MG PO TABS: 100.0000 mg | ORAL_TABLET | Freq: Two times a day (BID) | ORAL | 0 refills | Status: AC

## 2018-03-31 MED ORDER — PREDNISONE 10 MG PO TABS: ORAL_TABLET | ORAL | 0 refills | Status: DC

## 2018-03-31 NOTE — Telephone Encounter (Signed)
Pt is calling back [phone] 

## 2018-03-31 NOTE — Telephone Encounter (Signed)
Please call in Doxycycline 100 mg BID x 7 days Prednisone taper; 10 mg tablets: 4 tabs x 2 days, 3 tabs x 2 days, 2 tabs x 2 days 1 tab x 2 days then stop. If she gets worse , not better, she needs to be seen. No further telemedicine without being seen. Avoid sunlight while on Doxycycline Take with food and full glass of water.

## 2018-03-31 NOTE — Telephone Encounter (Signed)
LMTCB

## 2018-03-31 NOTE — Telephone Encounter (Signed)
Called and spoke to patient, patient states she has chest congestion, coughing with green thick mucous, has been taking Mucinex and Advil for lat week. States she has a fever of 100.2. She reports her grandchildren has been sick for the last week. States she has been using her rescue inhaler Ventolin more and would like something called in. I explained that with a fever it would be best if she came in. Patient declined appointment stating Dr. Halford Chessman normally just calls something in. I explained that Dr. Halford Chessman was not here today and another provider would be making that call. Voiced understanding.

## 2018-03-31 NOTE — Telephone Encounter (Signed)
Called and spoke to medicine, made aware Doxycycline and Prednisone has been sent in. Informed patient to take with food and drink plenty of water. Voiced understanding. Orders sent to pharmacy. Nothing further is needed at this time.

## 2018-04-08 ENCOUNTER — Ambulatory Visit: Payer: BLUE CROSS/BLUE SHIELD | Admitting: Family Medicine

## 2018-04-08 DIAGNOSIS — Z01419 Encounter for gynecological examination (general) (routine) without abnormal findings: Secondary | ICD-10-CM | POA: Diagnosis not present

## 2018-04-08 DIAGNOSIS — Z13820 Encounter for screening for osteoporosis: Secondary | ICD-10-CM | POA: Diagnosis not present

## 2018-04-08 DIAGNOSIS — Z681 Body mass index (BMI) 19 or less, adult: Secondary | ICD-10-CM | POA: Diagnosis not present

## 2018-04-08 LAB — HM DEXA SCAN

## 2018-04-09 ENCOUNTER — Encounter: Payer: Self-pay | Admitting: Family Medicine

## 2018-04-12 ENCOUNTER — Telehealth: Payer: Self-pay | Admitting: Family Medicine

## 2018-04-12 NOTE — Telephone Encounter (Signed)
Received echart message from pt. The best way to cover her questions and get her labs is to schedule her for her CPE and we will talk about her bone density and treatment as well.  - I dod not get her bone density results -- can we make sure we get this from the GYN office.

## 2018-04-12 NOTE — Telephone Encounter (Signed)
Message left for patient to return call.

## 2018-04-12 NOTE — Telephone Encounter (Signed)
Copied from [place] [phone]. Topic: Quick Communication - Lab Results (Clinic Use ONLY) >> Apr 12, 2018  3:44 PM Ray, Lynnell Jude, CMA wrote: Message left for patient to return call. Okay for [facility] nurse to give information and obtain information regarding OB/GYN. See note. >> Apr 12, 2018  4:41 PM Cecelia Byars, NT wrote: Patient returned call from practice  , please 336 [phone]

## 2018-04-13 NOTE — Telephone Encounter (Signed)
Returned phone call, detailed message left on voice mail. See note.

## 2018-04-13 NOTE — Telephone Encounter (Signed)
Detailed message left on voice mail. Okay per DPR. 

## 2018-04-15 DIAGNOSIS — Z1231 Encounter for screening mammogram for malignant neoplasm of breast: Secondary | ICD-10-CM | POA: Diagnosis not present

## 2018-05-04 DIAGNOSIS — D0470 Carcinoma in situ of skin of unspecified lower limb, including hip: Secondary | ICD-10-CM

## 2018-05-04 HISTORY — DX: Carcinoma in situ of skin of unspecified lower limb, including hip: D04.70

## 2018-05-07 ENCOUNTER — Encounter

## 2018-05-07 ENCOUNTER — Ambulatory Visit (HOSPITAL_BASED_OUTPATIENT_CLINIC_OR_DEPARTMENT_OTHER)
Admission: RE | Admit: 2018-05-07 | Discharge: 2018-05-07 | Disposition: A | Discharge status: Home / Self Care | Payer: BLUE CROSS/BLUE SHIELD | Source: Ambulatory Visit | Attending: Family Medicine | Admitting: Family Medicine

## 2018-05-07 ENCOUNTER — Ambulatory Visit (INDEPENDENT_AMBULATORY_CARE_PROVIDER_SITE_OTHER): Payer: BLUE CROSS/BLUE SHIELD | Admitting: Family Medicine

## 2018-05-07 ENCOUNTER — Other Ambulatory Visit: Payer: Self-pay

## 2018-05-07 ENCOUNTER — Encounter: Payer: Self-pay | Admitting: Family Medicine

## 2018-05-07 VITALS — BP 127/89 | HR 119 | Temp 99.2°F | Resp 16 | Ht 65.0 in | Wt 108.0 lb

## 2018-05-07 DIAGNOSIS — R0989 Other specified symptoms and signs involving the circulatory and respiratory systems: Secondary | ICD-10-CM | POA: Diagnosis not present

## 2018-05-07 DIAGNOSIS — J441 Chronic obstructive pulmonary disease with (acute) exacerbation: Secondary | ICD-10-CM | POA: Insufficient documentation

## 2018-05-07 DIAGNOSIS — R05 Cough: Secondary | ICD-10-CM | POA: Diagnosis not present

## 2018-05-07 DIAGNOSIS — R062 Wheezing: Secondary | ICD-10-CM | POA: Diagnosis not present

## 2018-05-07 DIAGNOSIS — M810 Age-related osteoporosis without current pathological fracture: Secondary | ICD-10-CM

## 2018-05-07 LAB — POC INFLUENZA A&B (BINAX/QUICKVUE)
INFLUENZA A, POC: NEGATIVE
INFLUENZA B, POC: NEGATIVE

## 2018-05-07 MED ORDER — ALENDRONATE SODIUM 70 MG PO TABS: 70.0000 mg | ORAL_TABLET | ORAL | 11 refills | Status: DC

## 2018-05-07 MED ORDER — LEVOFLOXACIN 750 MG PO TABS: 750.0000 mg | ORAL_TABLET | Freq: Every day | ORAL | 0 refills | Status: DC

## 2018-05-07 MED ORDER — PREDNISONE 20 MG PO TABS: ORAL_TABLET | ORAL | 0 refills | Status: DC

## 2018-05-07 MED ORDER — ALBUTEROL SULFATE (2.5 MG/3ML) 0.083% IN NEBU: 2.5000 mg | INHALATION_SOLUTION | Freq: Four times a day (QID) | RESPIRATORY_TRACT | 1 refills | Status: DC | PRN

## 2018-05-07 MED ORDER — IPRATROPIUM-ALBUTEROL 0.5-2.5 (3) MG/3ML IN SOLN
3.0000 mL | Freq: Once | RESPIRATORY_TRACT | Status: AC
  Administered 2018-05-07: 3 mL via RESPIRATORY_TRACT

## 2018-05-07 NOTE — Patient Instructions (Signed)
Rest, hydrate. I am concerned you have PNA --> please have [facility] at [facility] + flonase daily , mucinex DM (per label)  and nasal saline rinse 2-3x a day .  levaquin and prednisone prescribed, take until completed.  If cough present it can last up to 6-8 weeks.  F/U 2 weeks of not improved.   Use albuterol every 4 hours as needed.    Start fosamax once weekly.    Osteoporosis  Osteoporosis happens when your bones get thin and weak. This can cause your bones to break (fracture) more easily. You can do things at home to make your bones stronger. Follow these instructions at home:  Activity  Exercise as told by your doctor. Ask your doctor what activities are safe for you. You should do: ? Exercises that make your muscles work to hold your body weight up (weight-bearing exercises). These include tai chi, yoga, and walking. ? Exercises to make your muscles stronger. One example is lifting weights. Lifestyle  Limit alcohol intake to no more than 1 drink a day for nonpregnant women and 2 drinks a day for men. One drink equals 12 oz of beer, 5 oz of wine, or 1 oz of hard liquor.  Do not use any products that have nicotine or tobacco in them. These include cigarettes and e-cigarettes. If you need help quitting, ask your doctor. Preventing falls  Use tools to help you move around (mobility aids) as needed. These include canes, walkers, scooters, and crutches.  Keep rooms well-lit and free of clutter.  Put away things that could make you trip. These include cords and rugs.  Install safety rails on stairs. Install grab bars in bathrooms.  Use rubber mats in slippery areas, like bathrooms.  Wear shoes that: ? Fit you well. ? Support your feet. ? Have closed toes. ? Have rubber soles or low heels.  Tell your doctor about all of the medicines you are taking. Some medicines can make you more likely to fall. General instructions  Eat plenty of calcium and vitamin D. These  nutrients are good for your bones. Good sources of calcium and vitamin D include: ? Some fatty fish, such as salmon and tuna. ? Foods that have calcium and vitamin D added to them (fortified foods). For example, some breakfast cereals are fortified with calcium and vitamin D. ? Egg yolks. ? Cheese. ? Liver.  Take over-the-counter and prescription medicines only as told by your doctor.  Keep all follow-up visits as told by your doctor. This is important. Contact a doctor if:  You have not been tested (screened) for osteoporosis and you are: ? A woman who is age 37 or older. ? A man who is age 59 or older. Get help right away if:  You fall.  You get hurt. Summary  Osteoporosis happens when your bones get thin and weak.  Weak bones can break (fracture) more easily.  Eat plenty of calcium and vitamin D. These nutrients are good for your bones.  Tell your doctor about all of the medicines that you take. This information is not intended to replace advice given to you by your health care provider. Make sure you discuss any questions you have with your health care provider. Document Released: 07/14/2011 Document Revised: 02/13/2017 Document Reviewed: 02/13/2017 Elsevier Interactive Patient Education  2019 [employer].

## 2018-05-07 NOTE — Progress Notes (Signed)
[name] , 28-Sep-1955, 63 y.o., female MRN: [id number] Patient Care Team    Relationship Specialty Notifications Start End  Ma Hillock, DO PCP - General Family Medicine  04/23/15   Volanda Napoleon, MD Consulting Physician Oncology  05/12/13   Maisie Fus, MD Consulting Physician Obstetrics and Gynecology  01/01/15   Chesley Mires, MD Consulting Physician Pulmonary Disease  02/22/16   Carolan Clines, MD Consulting Physician Urology  04/21/16   Ayesha Mohair  Dermatology  07/04/16    Comment: [facility]    Chief Complaint  Patient presents with  . URI    x 4 days. Nasal congestion, cough-productive, fever 101.55F. Advil, Mucinex, Sudafed     Subjective: Pt presents for an OV with complaints of cough- productive of 4 days duration.  Associated symptoms include nasal congestion, runny nose, Fever Tmax 101.55F, wheezing, fatigue and headache. She has been taking advil for fever (last dose < 4 hr), needing her albuterol inhaler for wheezing (last dose < 2 hr).   Osteoporosis:  Pt provided bone density result today. Severe osteoporosis. Calcium and vit d normal 10/2017. Thin, caucasian, smoker.   Depression screen [facility] 2/9 10/28/2017 08/10/2017  Decreased Interest 0 0  Down, Depressed, Hopeless 0 0  PHQ - 2 Score 0 0    No Known Allergies Social History   Tobacco Use  . Smoking status: Current Every Day Smoker    Packs/day: 1.00    Years: 29.00    Pack years: 29.00    Types: Cigarettes    Start date: 06/01/1978  . Smokeless tobacco: Never Used  . Tobacco comment: Current 3/4PPD (12/12/16)  Substance Use Topics  . Alcohol use: Yes    Alcohol/week: 7.0 standard drinks    Types: 7 Glasses of wine per week   Past Medical History:  Diagnosis Date  . Basal cell carcinoma   . History of acute bronchitis 03/24/2011  . Lymphadenopathy, abdominal 03/2013   Noted on abd/pelv CT done to eval slow-to-resolve pyelo  . Meniere disorder  01/23/2012  . Osteoporosis 12/2014   Dr. Nori Riis, her GYN, dx'd her  . Recurrent pyelonephritis   . Seasonal allergic rhinitis   . Squamous cell carcinoma in situ (SCCIS) of skin of lower leg 05/04/2018   Dr. Janan Ridge, [facility]   Past Surgical History:  Procedure Laterality Date  . biopsy on skin cancer    . DEXA  12/2014   T score spine -2.4, hip -3.5 (Dr. Nori Riis, [facility]  [name] DILATION AND CURETTAGE OF UTERUS  2009   Family History  Problem Relation Age of Onset  . Hypertension Mother   . Glaucoma Mother   . Irritable bowel syndrome Mother   . Arthritis Mother   . Kidney disease Mother   . Diabetes Mother   . Hypertension Father   . Heart disease Father        bypasses  . Cancer Father        sarcoma  . Cancer Maternal Grandmother        breast  . Heart disease Maternal Grandmother        CHF  . Stroke Maternal Grandmother        in his 47's  . Stroke Maternal Grandfather   . Other Paternal Grandmother        hardening of the arteries  . Heart attack Paternal Grandfather   . Colon cancer Paternal Aunt 18  .  Stomach cancer Neg Hx    Allergies as of 05/07/2018   No Known Allergies     Medication List       Accurate as of May 07, 2018 10:39 AM. Always use your most recent med list.        AEROCHAMBER MV inhaler Use as instructed   albuterol (2.5 MG/3ML) 0.083% nebulizer solution Commonly known as:  PROVENTIL Take 2.5 mg by nebulization every 6 (six) hours as needed for wheezing or shortness of breath.   VENTOLIN HFA 108 (90 Base) MCG/ACT inhaler Generic drug:  albuterol TAKE 2 PUFFS BY MOUTH EVERY 6 HOURS AS NEEDED FOR WHEEZE OR SHORTNESS OF BREATH   alendronate 70 MG tablet Commonly known as:  FOSAMAX Take 1 tablet (70 mg total) by mouth every 7 (seven) days. Take with a full glass of water on an empty stomach.   azelastine 0.1 % nasal spray Commonly known as:  ASTELIN Place 1 spray into both nostrils 2 (two)  times daily. Use in each nostril as directed   fluorouracil 5 % cream Commonly known as:  EFUDEX APPLY1 APPLICATION ON THE SKIN AT BEDTIME FOR 4 WEEKS   levofloxacin 750 MG tablet Commonly known as:  LEVAQUIN Take 1 tablet (750 mg total) by mouth daily.   montelukast 10 MG tablet Commonly known as:  SINGULAIR Take 1 tablet (10 mg total) by mouth at bedtime.   multivitamin tablet Take 1 tablet by mouth daily.   predniSONE 20 MG tablet Commonly known as:  DELTASONE 60 mg x3d, 40 mg x3d, 20 mg x2d, 10 mg x2d   SYMBICORT 160-4.5 MCG/ACT inhaler Generic drug:  budesonide-formoterol TAKE 2 PUFFS BY MOUTH TWICE A DAY   tretinoin 0.025 % cream Commonly known as:  RETIN-A TAKE 1 APPLICATION THIN LAYER TO FACE NIGHTLY   VITAMIN D (CHOLECALCIFEROL) PO Take by mouth.       All past medical history, surgical history, allergies, family history, immunizations andmedications were updated in the EMR today and reviewed under the history and medication portions of their EMR.     ROS: Negative, with the exception of above mentioned in HPI   Objective:  BP 127/89   Pulse (!) 119   Temp 99.2 F (37.3 C) (Oral)   Resp 16   Ht 5\' 5"  (1.651 m)   Wt 108 lb (49 kg)   SpO2 91%   BMI 17.97 kg/m  Body mass index is 17.97 kg/m. Gen: Afebrile. No acute distress. Nontoxic in appearance, well developed, well nourished.  HENT: AT. [place]. Bilateral TM visualized WNL. MMM, no oral lesions. Bilateral nares with severe erythema and swelling right nare, drainage present. Throat without erythema or exudates. Cough present.  Eyes:Pupils Equal Round Reactive to light, Extraocular movements intact,  Conjunctiva without redness, discharge or icterus. Neck/lymp/endocrine: Supple,no lymphadenopathy CV: RRR (mild tachy also had albuterol) Chest: Bilateral wheezing and RLL crackles. Cough with deep inspiration.  Neuro: Normal gait. PERLA. EOMi. Alert. Oriented x3    No exam data present No results  found. Results for orders placed or performed in visit on 05/07/18 (from the past 24 hour(s))  POC Influenza A&B(BINAX/QUICKVUE)     Status: Normal   Collection Time: 05/07/18 10:30 AM  Result Value Ref Range   Influenza A, POC Negative Negative   Influenza B, POC Negative Negative    Assessment/Plan: BRYLINN TEANEY is a 63 y.o. female present for OV for  Wheezing/COPD with acute exacerbation ([place]) Concern for PNA RLL with persistent crackles. Wheezing  resolved with duoneb in the office. Ox sat remained at 90-91% before and after treatment- which is below baseline of about 94-96% for her.  - POC Influenza A&B(BINAX/QUICKVUE)--> Negative - ipratropium-albuterol (DUONEB) 0.5-2.5 (3) MG/3ML nebulizer solution 3 mL - levofloxacin (LEVAQUIN) 750 MG tablet; Take 1 tablet (750 mg total) by mouth daily.  Dispense: 5 tablet; Refill: 0 - predniSONE (DELTASONE) 20 MG tablet; 60 mg x3d, 40 mg x3d, 20 mg x2d, 10 mg x2d  Dispense: 18 tablet; Refill: 0 - DG Chest 2 View; Future - albuterol (PROVENTIL) (2.5 MG/3ML) 0.083% nebulizer solution; Take 3 mLs (2.5 mg total) by nebulization every 6 (six) hours as needed for wheezing or shortness of breath.  Dispense: 75 mL; Refill: 1  Rest, hydrate. CXR. LQ. Prednisone, albuterol 1-2 puff q 4 hr PRN.Continue allergy and COPD inhaler/meds + flonase daily , mucinex DM (per label)  and nasal saline rinse 2-3x a day .  levaquin and prednisone prescribed, take until completed.  If cough present it can last up to 6-8 weeks.  F/U 2 weeks of not improved.   Osteoporosis without current pathological fracture, unspecified osteoporosis type - discussed options with her today. She has rather severe osteoporosis by her bone density results received today.  - Her GYN had encouraged her to see a bone specialist which she does not desire to do. No medications have been started yet. She has never tried any of the bisphos group in the past.  - Trial of fosamax once weekly.     Reviewed expectations re: course of current medical issues.  Discussed self-management of symptoms.  Outlined signs and symptoms indicating need for more acute intervention.  Patient verbalized understanding and all questions were answered.  Patient received an After-Visit Summary.    Orders Placed This Encounter  Procedures  . DG Chest 2 View  . POC Influenza A&B(BINAX/QUICKVUE)     Note is dictated utilizing voice recognition software. Although note has been proof read prior to signing, occasional typographical errors still can be missed. If any questions arise, please do not hesitate to call for verification.   electronically signed by:  Howard Pouch, DO  [place]

## 2018-05-17 ENCOUNTER — Encounter: Payer: Self-pay | Admitting: Family Medicine

## 2018-05-17 ENCOUNTER — Telehealth: Payer: Self-pay

## 2018-05-17 ENCOUNTER — Ambulatory Visit (INDEPENDENT_AMBULATORY_CARE_PROVIDER_SITE_OTHER): Payer: BLUE CROSS/BLUE SHIELD | Admitting: Family Medicine

## 2018-05-17 VITALS — BP 127/90 | HR 128 | Temp 99.7°F | Resp 16 | Ht 65.0 in | Wt 108.0 lb

## 2018-05-17 DIAGNOSIS — R109 Unspecified abdominal pain: Secondary | ICD-10-CM

## 2018-05-17 DIAGNOSIS — R1011 Right upper quadrant pain: Secondary | ICD-10-CM

## 2018-05-17 LAB — POC URINALSYSI DIPSTICK (AUTOMATED)
Bilirubin, UA: NEGATIVE
Blood, UA: POSITIVE
Glucose, UA: NEGATIVE
Ketones, UA: NEGATIVE
LEUKOCYTES UA: NEGATIVE
Nitrite, UA: NEGATIVE
PH UA: 7.5 (ref 5.0–8.0)
Protein, UA: NEGATIVE
Spec Grav, UA: 1.015 (ref 1.010–1.025)
Urobilinogen, UA: 0.2 E.U./dL

## 2018-05-17 LAB — BASIC METABOLIC PANEL
BUN: 15 mg/dL (ref 6–23)
CO2: 29 mEq/L (ref 19–32)
Calcium: 9.6 mg/dL (ref 8.4–10.5)
Chloride: 96 mEq/L (ref 96–112)
Creatinine, Ser: 0.85 mg/dL (ref 0.40–1.20)
GFR: 72 mL/min (ref >60.00)
Glucose, Bld: 112 mg/dL — ABNORMAL HIGH (ref 70–99)
Potassium: 4.3 mEq/L (ref 3.5–5.1)
Sodium: 135 mEq/L (ref 135–145)

## 2018-05-17 MED ORDER — CEFTRIAXONE SODIUM 1 G IJ SOLR
1.0000 g | Freq: Once | INTRAMUSCULAR | Status: AC
  Administered 2018-05-17: 1 g via INTRAMUSCULAR

## 2018-05-17 MED ORDER — CEPHALEXIN 500 MG PO CAPS: 500.0000 mg | ORAL_CAPSULE | Freq: Four times a day (QID) | ORAL | 0 refills | Status: DC

## 2018-05-17 MED ORDER — KETOROLAC TROMETHAMINE 60 MG/2ML IM SOLN
60.0000 mg | Freq: Once | INTRAMUSCULAR | Status: AC
  Administered 2018-05-17: 60 mg via INTRAMUSCULAR

## 2018-05-17 MED ORDER — CEFDINIR 300 MG PO CAPS: 300.0000 mg | ORAL_CAPSULE | Freq: Two times a day (BID) | ORAL | 0 refills | Status: DC

## 2018-05-17 NOTE — Progress Notes (Signed)
[name] , Jun 09, 1955, 63 y.o., female MRN: [id number] Patient Care Team    Relationship Specialty Notifications Start End  Ma Hillock, DO PCP - General Family Medicine  04/23/15   Volanda Napoleon, MD Consulting Physician Oncology  05/12/13   Maisie Fus, MD Consulting Physician Obstetrics and Gynecology  01/01/15   Chesley Mires, MD Consulting Physician Pulmonary Disease  02/22/16   Carolan Clines, MD (Inactive) Consulting Physician Urology  04/21/16   Ayesha Mohair  Dermatology  07/04/16    Comment: [facility]    Chief Complaint  Patient presents with  . Abdominal Pain    x2 Back pain that radiates to her Rt side to her RLQ  . Back Pain     Subjective: Pt presents for an OV with complaints of abdominal pain of of 2 days duration.  Associated symptoms include chills, low right sided back pain that radiates to RUQ/flank area. She thinks she had a kidney stone once many years ago. History of pyelonephritis, recurrent. Smoker. CT 2015 normal kidneys. All urines visualized in EMR have RBC present. She is established with urology (Dr. Gaynelle Arabian in the past).  Depression screen [facility] 2/9 10/28/2017 08/10/2017  Decreased Interest 0 0  Down, Depressed, Hopeless 0 0  PHQ - 2 Score 0 0    No Known Allergies Social History   Tobacco Use  . Smoking status: Current Every Day Smoker    Packs/day: 1.00    Years: 29.00    Pack years: 29.00    Types: Cigarettes    Start date: 06/01/1978  . Smokeless tobacco: Never Used  . Tobacco comment: Current 3/4PPD (12/12/16)  Substance Use Topics  . Alcohol use: Yes    Alcohol/week: 7.0 standard drinks    Types: 7 Glasses of wine per week   Past Medical History:  Diagnosis Date  . Basal cell carcinoma   . History of acute bronchitis 03/24/2011  . Lymphadenopathy, abdominal 03/2013   Noted on abd/pelv CT done to eval slow-to-resolve pyelo  . Meniere disorder 01/23/2012  . Osteoporosis 12/2014   Dr. Nori Riis, her GYN, dx'd her  . Recurrent pyelonephritis   . Seasonal allergic rhinitis   . Squamous cell carcinoma in situ (SCCIS) of skin of lower leg 05/04/2018   Dr. Janan Ridge, [facility]   Past Surgical History:  Procedure Laterality Date  . biopsy on skin cancer    . DEXA  12/2014   T score spine -2.4, hip -3.5 (Dr. Nori Riis, [facility].  [name] DILATION AND CURETTAGE OF UTERUS  2009   Family History  Problem Relation Age of Onset  . Hypertension Mother   . Glaucoma Mother   . Irritable bowel syndrome Mother   . Arthritis Mother   . Kidney disease Mother   . Diabetes Mother   . Hypertension Father   . Heart disease Father        bypasses  . Cancer Father        sarcoma  . Cancer Maternal Grandmother        breast  . Heart disease Maternal Grandmother        CHF  . Stroke Maternal Grandmother        in his 66's  . Stroke Maternal Grandfather   . Other Paternal Grandmother        hardening of the arteries  . Heart attack Paternal Grandfather   . Colon cancer Paternal Aunt 30  .  Stomach cancer Neg Hx    Allergies as of 05/17/2018   No Known Allergies     Medication List       Accurate as of May 17, 2018  2:09 PM. Always use your most recent med list.        AEROCHAMBER MV inhaler Use as instructed   alendronate 70 MG tablet Commonly known as:  FOSAMAX Take 1 tablet (70 mg total) by mouth every 7 (seven) days. Take with a full glass of water on an empty stomach.   azelastine 0.1 % nasal spray Commonly known as:  ASTELIN Place 1 spray into both nostrils 2 (two) times daily. Use in each nostril as directed   fluorouracil 5 % cream Commonly known as:  EFUDEX APPLY1 APPLICATION ON THE SKIN AT BEDTIME FOR 4 WEEKS   montelukast 10 MG tablet Commonly known as:  SINGULAIR Take 1 tablet (10 mg total) by mouth at bedtime.   multivitamin tablet Take 1 tablet by mouth daily.   SYMBICORT 160-4.5 MCG/ACT inhaler Generic drug:   budesonide-formoterol TAKE 2 PUFFS BY MOUTH TWICE A DAY   tretinoin 0.025 % cream Commonly known as:  RETIN-A TAKE 1 APPLICATION THIN LAYER TO FACE NIGHTLY   VENTOLIN HFA 108 (90 Base) MCG/ACT inhaler Generic drug:  albuterol TAKE 2 PUFFS BY MOUTH EVERY 6 HOURS AS NEEDED FOR WHEEZE OR SHORTNESS OF BREATH   albuterol (2.5 MG/3ML) 0.083% nebulizer solution Commonly known as:  PROVENTIL Take 3 mLs (2.5 mg total) by nebulization every 6 (six) hours as needed for wheezing or shortness of breath.   VITAMIN D (CHOLECALCIFEROL) PO Take by mouth.       All past medical history, surgical history, allergies, family history, immunizations andmedications were updated in the EMR today and reviewed under the history and medication portions of their EMR.     ROS: Negative, with the exception of above mentioned in HPI   Objective:  BP 127/90 (BP Location: Left Arm, Patient Position: Sitting, Cuff Size: Normal)   Pulse (!) 128   Temp 99.7 F (37.6 C) (Oral)   Resp 16   Ht 5\' 5"  (1.651 m)   Wt 108 lb (49 kg)   SpO2 96%   BMI 17.97 kg/m  Body mass index is 17.97 kg/m. Gen: febrile. No acute distress. Nontoxic in appearance, well developed, well nourished. Appears to be in pain.  HENT: AT. .  MMM Eyes:Pupils Equal Round Reactive to light, Extraocular movements intact,  Conjunctiva without redness, discharge or icterus. CV: Tachycardic Chest: CTAB, no wheeze or crackles. Good air movement, normal resp effort.  Abd: Soft. flat ND. TTP right flank. BS presetnt. no Masses palpated. No rebound, mild guarding.  MSK: Right CVA tenderness present.  Skin: No rashes, purpura or petechiae.  Neuro: Normal gait. PERLA. EOMi. Alert. Oriented x3   No exam data present No results found. Results for orders placed or performed in visit on 05/17/18 (from the past 24 hour(s))  POCT Urinalysis Dipstick (Automated)     Status: Abnormal   Collection Time: 05/17/18  2:07 PM  Result Value Ref Range    Color, UA yellow    Clarity, UA clear    Glucose, UA Negative Negative   Bilirubin, UA negative    Ketones, UA negative    Spec Grav, UA 1.015 1.010 - 1.025   Blood, UA Positive    pH, UA 7.5 5.0 - 8.0   Protein, UA Negative Negative   Urobilinogen, UA 0.2 0.2 or 1.0 E.U./dL  Nitrite, UA negative    Leukocytes, UA Negative Negative    Assessment/Plan: [name] is a 63 y.o. female present for OV for  Flank pain/Right upper quadrant abdominal pain - smoker female- chronic hematuria and recurrent pyelonephritis.She is rather tachycardic today and low grade fever- she feels like when she had pyelo in the past- no urinary symptoms.  - POCT Urinalysis Dipstick (Automated) - Urine Culture - CBC w/Diff - Basic Metabolic Panel (BMET) - CT RENAL STONE STUDY; Future - rocephin and Toradol injection today.  Pt declined pain medications.  - omnicef BID for 10 days - F/U dependent on stone study and labs. - emergent symptoms discussed.       Reviewed expectations re: course of current medical issues.  Discussed self-management of symptoms.  Outlined signs and symptoms indicating need for more acute intervention.  Patient verbalized understanding and all questions were answered.  Patient received an After-Visit Summary.    Orders Placed This Encounter  Procedures  . POCT Urinalysis Dipstick (Automated)     Note is dictated utilizing voice recognition software. Although note has been proof read prior to signing, occasional typographical errors still can be missed. If any questions arise, please do not hesitate to call for verification.   electronically signed by:  Howard Pouch, DO  [place]

## 2018-05-17 NOTE — Patient Instructions (Signed)
Covered today for pain and infection causes.  Please have renal stone study completed -- they will call to schedule.  We will check kidney function and CBC today.   Start antibiotic tomorrow - injection today.  Toradol injection will also help with comfort.   If unable to tolerate meds, symptoms worsen or decreased urinary output-- please go to ED for evaluation.   Need to rule out stone vs kidney lesion vs infection.   Pyelonephritis, Adult  Pyelonephritis is a kidney infection. The kidneys are organs that help clean your blood by moving waste out of your blood and into your pee (urine). This infection can happen quickly, or it can last for a long time. In most cases, it clears up with treatment and does not cause other problems. Follow these instructions at home: Medicines  Take over-the-counter and prescription medicines only as told by your doctor.  Take your antibiotic medicine as told by your doctor. Do not stop taking the medicine even if you start to feel better. General instructions  Drink enough fluid to keep your pee clear or pale yellow.  Avoid caffeine, tea, and carbonated drinks.  Pee (urinate) often. Avoid holding in pee for long periods of time.  Pee before and after sex.  After pooping (having a bowel movement), women should wipe from front to back. Use each tissue only once.  Keep all follow-up visits as told by your doctor. This is important. Contact a doctor if:  You do not feel better after 2 days.  Your symptoms get worse.  You have a fever. Get help right away if:  You cannot take your medicine or drink fluids as told.  You have chills and shaking.  You throw up (vomit).  You have very bad pain in your side (flank) or back.  You feel very weak or you pass out (faint). This information is not intended to replace advice given to you by your health care provider. Make sure you discuss any questions you have with your health care  provider. Document Released: 05/29/2004 Document Revised: 09/27/2015 Document Reviewed: 08/14/2014 Elsevier Interactive Patient Education  2019 [employer].   Kidney Stones  Kidney stones (urolithiasis) are rock-like masses that form inside of the kidneys. Kidneys are organs that make pee (urine). A kidney stone can cause very bad pain and can block the flow of pee. The stone usually leaves your body (passes) through your pee. You may need to have a doctor take out the stone. Follow these instructions at home: Eating and drinking  Drink enough fluid to keep your pee clear or pale yellow. This will help you pass the stone.  If told by your doctor, change the foods you eat (your diet). This may include: ? Limiting how much salt (sodium) you eat. ? Eating more fruits and vegetables. ? Limiting how much meat, poultry, fish, and eggs you eat.  Follow instructions from your doctor about eating or drinking restrictions. General instructions  Collect pee samples as told by your doctor. You may need to collect a pee sample: ? 24 hours after a stone comes out. ? 8-12 weeks after a stone comes out, and every 6-12 months after that.  Strain your pee every time you pee (urinate), for as long as told. Use the strainer that your doctor recommends.  Do not throw out the stone. Keep it so that it can be tested by your doctor.  Take over-the-counter and prescription medicines only as told by your doctor.  Keep all  follow-up visits as told by your doctor. This is important. You may need follow-up tests. Preventing kidney stones To prevent another kidney stone:  Drink enough fluid to keep your pee clear or pale yellow. This is the best way to prevent kidney stones.  Eat healthy foods.  Avoid certain foods as told by your doctor. You may be told to eat less protein.  Stay at a healthy weight. Contact a doctor if:  You have pain that gets worse or does not get better with medicine. Get help  right away if:  You have a fever or chills.  You get very bad pain.  You get new pain in your belly (abdomen).  You pass out (faint).  You cannot pee. This information is not intended to replace advice given to you by your health care provider. Make sure you discuss any questions you have with your health care provider. Document Released: 10/08/2007 Document Revised: 01/08/2016 Document Reviewed: 01/08/2016 Elsevier Interactive Patient Education  2019 [employer].

## 2018-05-17 NOTE — Telephone Encounter (Signed)
CRITICAL VALUE STICKER  CRITICAL VALUE: WBC 28.5 Segs:90.1 Lymphs: 6 Monos: 3.6  Stain not performed at time of call.   RECEIVER (on-site recipient of call): Roderic Ovens, RN  DATE & TIME NOTIFIED: 05/17/2018  1706  MESSENGER (representative from lab): Rosie Fate  MD NOTIFIED: Raoul Pitch  TIME OF NOTIFICATION:1710  RESPONSE:

## 2018-05-17 NOTE — Telephone Encounter (Signed)
Please inform patient the following information: Advised patient of a critical lab with a white count of 28.5.  Recommend patient be seen in the emergency room immediately.  Concern for sepsis with her presentation today, WBC count and she was quite tachycardic.  Advised patient that his white count is extremely high and could possibly mean she has infection in her blood.  Patient declined recommendations.  She states she "is not going to do that."  Reports she might go in the morning, she will call in and let us know.  Again, discussed if this is sepsis the Rocephin shot we gave her today may not cover the cause of her sepsis which at this point is unknown, but presumed pyelonephritis.  Advised her worse case scenario, could be infection that leads to death.  She still declined visiting the emergency room tonight.  Advised her that medications provide to her orally today we will also not treat her infection she will need IV antibiotics and monitoring.

## 2018-05-18 ENCOUNTER — Other Ambulatory Visit: Payer: BLUE CROSS/BLUE SHIELD

## 2018-05-18 DIAGNOSIS — D729 Disorder of white blood cells, unspecified: Secondary | ICD-10-CM

## 2018-05-18 LAB — CBC WITH DIFFERENTIAL/PLATELET
BASOS ABS: 0.1 10*3/uL (ref 0.0–0.1)
Basophils Relative: 0.3 % (ref 0.0–3.0)
Eosinophils Absolute: 0 10*3/uL (ref 0.0–0.7)
Eosinophils Relative: 0 % (ref 0.0–5.0)
HCT: 51.3 % — ABNORMAL HIGH (ref 36.0–46.0)
Hemoglobin: 17 g/dL — ABNORMAL HIGH (ref 12.0–15.0)
Lymphocytes Relative: 6 % — ABNORMAL LOW (ref 12.0–46.0)
Lymphs Abs: 1.7 10*3/uL (ref 0.7–4.0)
MCHC: 33.3 g/dL (ref 30.0–36.0)
MCV: 97 fl (ref 78.0–100.0)
MONOS PCT: 3.6 % (ref 3.0–12.0)
Monocytes Absolute: 1 10*3/uL (ref 0.1–1.0)
Neutro Abs: 25.7 10*3/uL — ABNORMAL HIGH (ref 1.4–7.7)
Neutrophils Relative %: 90.1 % — ABNORMAL HIGH (ref 43.0–77.0)
Platelets: 210 10*3/uL (ref 150.0–400.0)
RBC: 5.28 Mil/uL — ABNORMAL HIGH (ref 3.87–5.11)
RDW: 14.2 % (ref 11.5–15.5)
WBC: 28.5 10*3/uL (ref 4.0–10.5)

## 2018-05-18 LAB — URINE CULTURE
MICRO NUMBER:: 48115
Result:: NO GROWTH
SPECIMEN QUALITY:: ADEQUATE

## 2018-05-18 NOTE — Telephone Encounter (Signed)
Spoke with patient, advised PCP continues to recommend ER evaluation even though she is feeling better. Explained that a definite diagnosis would result in proper treatment. Also advised patient CT was not approved by insurance. Patient verbalized understanding, states she will call back.

## 2018-05-18 NOTE — Telephone Encounter (Signed)
Please inform patient the following information: Although I sympathize with her desire to stay away from the ED- it  is still recommended.  I can not alter her current treatment plan in the outpatient setting when there is no definite diagnosis to tailor the treatment towards. To do so, would be extremely poor practice of medicine and I refuse to put her safety/health at risk by doing so.  We are assuming pyelonephritis, however this has not been proven. Omicef prescribed at her vsisit- provided same coverage orally as the rocephin does by IM. No additional antibiotic can be prescribed without knowing a diagnosis.   In addition, her insurance is denying the CT outpatient and her CBC was concerning enough with WBC at 28.5, but there are also "abnormal cells" which required additional testing by pathology.  In order to safely treat her, inpatient or outpatient, a diagnosis has to be proven to treat appropriately and safely. I understand this is not pleasing to her, but it is the medically appropriate decision.

## 2018-05-18 NOTE — Telephone Encounter (Signed)
Patient called office back today and stated that she feels better today, fever gone, pain down, and fatigue some better.  She states she WILL NOT go to the ED "if IV antibiotics will work, then they should work orally".  Patient states she wants to recheck blood work today and change antibiotics.   I STRONGLY encouraged patient to go to the ED as advised by Dr Raoul Pitch due to possible sepsis.   Patient still insists she will not go to ED because she is some better today.  She wants [place] to make Dr Raoul Pitch aware of her symptoms and asks for her to return call.

## 2018-05-19 ENCOUNTER — Telehealth: Payer: Self-pay | Admitting: Family Medicine

## 2018-05-19 LAB — PATHOLOGIST SMEAR REVIEW

## 2018-05-19 NOTE — Telephone Encounter (Signed)
Pt returned call.  Informed patient urine culture showed no signs of UTI and liver/kidney functions were normal. Advised of pathology report and possible causes.  Advised if she is not improving, to go to the Emergency Room. If she is improving, finish antibiotics, hydrate and f/u early next week. Patient states she continues to have pain but feels better. Her heart rate has come down, but not back to her normal rate yet. Advised patient CT prior authorization is still in progress.  Patient scheduled f/u with PCP for Monday, 05/24/2018.

## 2018-05-19 NOTE — Telephone Encounter (Signed)
Please inform patient the following information: Her urine culture did not show any evidence of urinary tract infection. Her liver/kidney function is normal.  She is aware of her WBC being > 28- which is signs of severe infection (she refused to go to ED as advised). Her blood studies by pathology show an abundance of red blood cell population- uncertain cause- could be from infection, smoking, dehydration from illness or combination of all the above.   If she is not improving, I suggest she still go to ED.  If she is improving take the antibiotics provided to completion, hydrate and follow up Monday or Tuesday for recheck.

## 2018-05-19 NOTE — Telephone Encounter (Signed)
LM requesting call back.  

## 2018-05-20 ENCOUNTER — Ambulatory Visit (INDEPENDENT_AMBULATORY_CARE_PROVIDER_SITE_OTHER): Payer: BLUE CROSS/BLUE SHIELD | Admitting: Nurse Practitioner

## 2018-05-20 ENCOUNTER — Ambulatory Visit (INDEPENDENT_AMBULATORY_CARE_PROVIDER_SITE_OTHER)
Admission: RE | Admit: 2018-05-20 | Discharge: 2018-05-20 | Disposition: A | Discharge status: Home / Self Care | Payer: BLUE CROSS/BLUE SHIELD | Source: Ambulatory Visit | Attending: Nurse Practitioner | Admitting: Nurse Practitioner

## 2018-05-20 ENCOUNTER — Encounter: Payer: Self-pay | Admitting: Nurse Practitioner

## 2018-05-20 VITALS — BP 126/82 | HR 71 | Temp 98.7°F | Ht 65.0 in | Wt 109.2 lb

## 2018-05-20 DIAGNOSIS — R059 Cough, unspecified: Secondary | ICD-10-CM

## 2018-05-20 DIAGNOSIS — R05 Cough: Secondary | ICD-10-CM | POA: Diagnosis not present

## 2018-05-20 DIAGNOSIS — J449 Chronic obstructive pulmonary disease, unspecified: Secondary | ICD-10-CM

## 2018-05-20 DIAGNOSIS — C44722 Squamous cell carcinoma of skin of right lower limb, including hip: Secondary | ICD-10-CM | POA: Diagnosis not present

## 2018-05-20 DIAGNOSIS — J40 Bronchitis, not specified as acute or chronic: Secondary | ICD-10-CM | POA: Diagnosis not present

## 2018-05-20 DIAGNOSIS — R109 Unspecified abdominal pain: Secondary | ICD-10-CM

## 2018-05-20 DIAGNOSIS — L905 Scar conditions and fibrosis of skin: Secondary | ICD-10-CM | POA: Diagnosis not present

## 2018-05-20 DIAGNOSIS — C44729 Squamous cell carcinoma of skin of left lower limb, including hip: Secondary | ICD-10-CM | POA: Diagnosis not present

## 2018-05-20 LAB — CBC WITH DIFFERENTIAL/PLATELET
BASOS ABS: 0.1 10*3/uL (ref 0.0–0.1)
Basophils Relative: 0.8 % (ref 0.0–3.0)
Eosinophils Absolute: 0.1 10*3/uL (ref 0.0–0.7)
Eosinophils Relative: 1.6 % (ref 0.0–5.0)
HCT: 46.5 % — ABNORMAL HIGH (ref 36.0–46.0)
HEMOGLOBIN: 15.5 g/dL — AB (ref 12.0–15.0)
Lymphocytes Relative: 28.2 % (ref 12.0–46.0)
Lymphs Abs: 2.2 10*3/uL (ref 0.7–4.0)
MCHC: 33.4 g/dL (ref 30.0–36.0)
MCV: 97.6 fl (ref 78.0–100.0)
Monocytes Absolute: 0.9 10*3/uL (ref 0.1–1.0)
Monocytes Relative: 11.4 % (ref 3.0–12.0)
Neutro Abs: 4.5 10*3/uL (ref 1.4–7.7)
Neutrophils Relative %: 58 % (ref 43.0–77.0)
Platelets: 221 10*3/uL (ref 150.0–400.0)
RBC: 4.77 Mil/uL (ref 3.87–5.11)
RDW: 13.9 % (ref 11.5–15.5)
WBC: 7.7 10*3/uL (ref 4.0–10.5)

## 2018-05-20 NOTE — Progress Notes (Signed)
Reviewed and agree with assessment/plan.   Marquee Fuchs, MD [facility] 04/30/2016, 12:24 PM Pager:  [phone]  

## 2018-05-20 NOTE — Assessment & Plan Note (Signed)
Will check chest x ray today - concerned for PNA considering elevated WBC and fever.  Patient Instructions  Continue cefdinir May take mucinex May take delsym Will order chest x ray and call with results Will recheck CBC per patient request and call with results Follow up with Dr. Raoul Pitch on Monday as scheduled for follow up on possible kidney congestion Follow up with Dr. Halford Chessman or NP in 1 week or sooner if needed

## 2018-05-20 NOTE — Patient Instructions (Addendum)
Continue cefdinir May take mucinex May take delsym Will order chest x ray and call with results Will recheck CBC per patient request and call with results Follow up with Dr. Raoul Pitch on Monday as scheduled for follow up on possible kidney congestion Follow up with Dr. Halford Chessman or NP in 1 week or sooner if needed

## 2018-05-20 NOTE — Progress Notes (Signed)
@Patient  ID: [name], female    DOB: 05-Mar-1956, 63 y.o.   MRN: [id number]  Chief Complaint  Patient presents with  . Cough    with green congestion    Referring provider: Ma Hillock, DO  HPI  63 year old female active smoker with COPD/emphysema, bronchiectasis, and lung nodules followed by Dr. Halford Chessman.  Tests:  Chest imaging: CXR 05/07/18>>Hyperinflation without acute or superimposed abnormality. CT chest 04/21/16 >> borderline LAN, nodules in lung bases with tree in bud CT chest 10/22/16 >> moderate centrilobular emphysema, 1.2 cm nodule LUL, tree in bud, RLL BTX CT chest 01/26/17 >> moderate centrilobular emphysema, mild b/l BTX with tree in bud, LUL nodule resolved, stable 7 mm RLL nodule since 2015  PFT:  PFT Results Latest Ref Rng & Units 08/25/2016  FVC-Pre L 2.63  FVC-Predicted Pre % 77  FVC-Post L 2.72  FVC-Predicted Post % 80  Pre FEV1/FVC % % 55  Post FEV1/FCV % % 57  FEV1-Pre L 1.45  FEV1-Predicted Pre % 55  FEV1-Post L 1.55  DLCO UNC% % 52  DLCO COR %Predicted % 61  TLC L 5.33  TLC % Predicted % 103  RV % Predicted % 125    OV 05/20/18 - cough/chest congestion acute Patient presents today with acute cough and chest congestion.  Was seen by her PCP on 05/17/2018 right-sided flank pain.  She was found to have leukocytosis.  She was given a Rocephin injection in the office and prescribed cefdinir.  PCP was concerned for possible kidney stone/UTI. She has been compliant with the medication.  She states that she has been having some cough and chest congestion over the last few days with low-grade fever.  Cough is productive of green sputum.  She denies any sinus congestion.  She states that her right-sided flank pain has resolved.  She is compliant with Symbicort and Proventil.  She denies any chest pain or edema.  Denies any loss of appetite.  States that she has stayed well-hydrated.  Her vital signs are stable in the office today.  She is afebrile  today.  No Known Allergies  Immunization History  Administered Date(s) Administered  . Influenza Split 04/09/2011  . Influenza Whole 02/02/2009  . Influenza,inj,Quad PF,6+ Mos 01/26/2014, 02/06/2017, 03/04/2018  . Influenza-Unspecified 02/02/2013, 02/24/2015, 04/03/2016  . Zoster Recombinat (Shingrix) 03/04/2018    Past Medical History:  Diagnosis Date  . Basal cell carcinoma   . History of acute bronchitis 03/24/2011  . Lymphadenopathy, abdominal 03/2013   Noted on abd/pelv CT done to eval slow-to-resolve pyelo  . Meniere disorder 01/23/2012  . Osteoporosis 12/2014   Dr. Nori Riis, her GYN, dx'd her  . Recurrent pyelonephritis   . Seasonal allergic rhinitis   . Squamous cell carcinoma in situ (SCCIS) of skin of lower leg 05/04/2018   Dr. Janan Ridge, [facility]    Tobacco History: Social History   Tobacco Use  Smoking Status Current Every Day Smoker  . Packs/day: 1.00  . Years: 29.00  . Pack years: 29.00  . Types: Cigarettes  . Start date: 06/01/1978  Smokeless Tobacco Never Used  Tobacco Comment   Current 3/4PPD (12/12/16)   Ready to quit: Not Answered Counseling given: Not Answered Comment: Current 3/4PPD (12/12/16)   Outpatient Encounter Medications as of 05/20/2018  Medication Sig  . albuterol (PROVENTIL) (2.5 MG/3ML) 0.083% nebulizer solution Take 3 mLs (2.5 mg total) by nebulization every 6 (six) hours as needed for wheezing or shortness of breath.  [name]  alendronate (FOSAMAX) 70 MG tablet Take 1 tablet (70 mg total) by mouth every 7 (seven) days. Take with a full glass of water on an empty stomach.  [name] azelastine (ASTELIN) 0.1 % nasal spray Place 1 spray into both nostrils 2 (two) times daily. Use in each nostril as directed  . cefdinir (OMNICEF) 300 MG capsule Take 1 capsule (300 mg total) by mouth 2 (two) times daily.  . fluorouracil (EFUDEX) 5 % cream APPLY1 APPLICATION ON THE SKIN AT BEDTIME FOR 4 WEEKS  . montelukast (SINGULAIR) 10 MG tablet Take 1  tablet (10 mg total) by mouth at bedtime.  . Multiple Vitamin (MULTIVITAMIN) tablet Take 1 tablet by mouth daily.  [name] Spacer/Aero-Holding Chambers (AEROCHAMBER MV) inhaler Use as instructed  . SYMBICORT 160-4.5 MCG/ACT inhaler TAKE 2 PUFFS BY MOUTH TWICE A DAY  . tretinoin (RETIN-A) 0.025 % cream TAKE 1 APPLICATION THIN LAYER TO FACE NIGHTLY  . VENTOLIN HFA 108 (90 Base) MCG/ACT inhaler TAKE 2 PUFFS BY MOUTH EVERY 6 HOURS AS NEEDED FOR WHEEZE OR SHORTNESS OF BREATH  . VITAMIN D, CHOLECALCIFEROL, PO Take by mouth.   No facility-administered encounter medications on file as of 05/20/2018.      Review of Systems  Review of Systems  Constitutional: Positive for fever. Negative for chills.  HENT: Negative.  Negative for congestion.   Respiratory: Positive for cough. Negative for shortness of breath and wheezing.   Cardiovascular: Negative.  Negative for chest pain, palpitations and leg swelling.  Gastrointestinal: Negative.   Allergic/Immunologic: Negative.   Neurological: Negative.   Psychiatric/Behavioral: Negative.        Physical Exam  BP 126/82 (BP Location: Left Arm, Patient Position: Sitting, Cuff Size: Normal)   Pulse 71   Temp 98.7 F (37.1 C)   Ht 5\' 5"  (1.651 m)   Wt 109 lb 3.2 oz (49.5 kg)   SpO2 96%   BMI 18.17 kg/m   Wt Readings from Last 5 Encounters:  05/20/18 109 lb 3.2 oz (49.5 kg)  05/17/18 108 lb (49 kg)  05/07/18 108 lb (49 kg)  02/24/18 108 lb (49 kg)  01/12/18 105 lb (47.6 kg)     Physical Exam Vitals signs and nursing note reviewed.  Constitutional:      General: She is not in acute distress.    Appearance: She is well-developed.  Cardiovascular:     Rate and Rhythm: Normal rate and regular rhythm.  Pulmonary:     Effort: Pulmonary effort is normal. No respiratory distress.     Breath sounds: Examination of the right-lower field reveals rhonchi. Rhonchi present. No wheezing.  Neurological:     Mental Status: She is alert and oriented to  person, place, and time.     Imaging: Dg Chest 2 View  Result Date: 05/20/2018 CLINICAL DATA:  COPD with chronic bronchitis EXAM: CHEST - 2 VIEW COMPARISON:  05/07/2018 FINDINGS: COPD with pulmonary hyperinflation. Mild right lower lobe airspace disease with mild progression. No significant pleural effusion. Negative for heart failure. IMPRESSION: COPD.  Progression of mild right lower lobe atelectasis/pneumonia. Electronically Signed   By: Franchot Gallo M.D.   On: 05/20/2018 12:47   Dg Chest 2 View  Result Date: 05/07/2018 CLINICAL DATA:  Pt having cough,fever and congestion for a week,smoker EXAM: CHEST - 2 VIEW COMPARISON:  01/12/2018 FINDINGS: Pulmonary hyperinflation, with coarse perihilar interstitial markings as before. No focal infiltrate or overt edema. Heart size and mediastinal contours are within normal limits. No effusion. Visualized bones unremarkable. IMPRESSION: 1.  Hyperinflation without acute or superimposed abnormality. Electronically Signed   By: Lucrezia Europe M.D.   On: 05/07/2018 14:23     Assessment & Plan:   Bronchitis Will check chest x ray today - concerned for PNA considering elevated WBC and fever.  Patient Instructions  Continue cefdinir May take mucinex May take delsym Will order chest x ray and call with results Will recheck CBC per patient request and call with results Follow up with Dr. Raoul Pitch on Monday as scheduled for follow up on possible kidney congestion Follow up with Dr. Halford Chessman or NP in 1 week or sooner if needed       Fenton Foy, NP 05/20/2018

## 2018-05-24 ENCOUNTER — Other Ambulatory Visit (HOSPITAL_COMMUNITY)
Admission: RE | Admit: 2018-05-24 | Discharge: 2018-05-24 | Disposition: A | Discharge status: Home / Self Care | Payer: BLUE CROSS/BLUE SHIELD | Source: Ambulatory Visit | Attending: Family Medicine | Admitting: Family Medicine

## 2018-05-24 ENCOUNTER — Ambulatory Visit (INDEPENDENT_AMBULATORY_CARE_PROVIDER_SITE_OTHER): Payer: BLUE CROSS/BLUE SHIELD | Admitting: Family Medicine

## 2018-05-24 ENCOUNTER — Encounter: Payer: Self-pay | Admitting: Family Medicine

## 2018-05-24 VITALS — BP 142/88 | HR 99 | Temp 98.7°F | Resp 16 | Ht 65.0 in | Wt 109.0 lb

## 2018-05-24 DIAGNOSIS — R109 Unspecified abdominal pain: Secondary | ICD-10-CM | POA: Diagnosis not present

## 2018-05-24 DIAGNOSIS — R319 Hematuria, unspecified: Secondary | ICD-10-CM

## 2018-05-24 DIAGNOSIS — F17218 Nicotine dependence, cigarettes, with other nicotine-induced disorders: Secondary | ICD-10-CM

## 2018-05-24 HISTORY — PX: SKIN LESION EXCISION: SHX2412

## 2018-05-24 NOTE — Progress Notes (Signed)
[name] , [birth year], 63 y.o., female MRN: [id number] Patient Care Team    Relationship Specialty Notifications Start End  Ma Hillock, DO PCP - General Family Medicine  04/23/15   Volanda Napoleon, MD Consulting Physician Oncology  05/12/13   Maisie Fus, MD Consulting Physician Obstetrics and Gynecology  01/01/15   Chesley Mires, MD Consulting Physician Pulmonary Disease  02/22/16   Carolan Clines, MD (Inactive) Consulting Physician Urology  04/21/16   Ayesha Mohair  Dermatology  07/04/16    Comment: [facility], Olevia Bowens, DO Consulting Physician Sports Medicine  05/17/18     Chief Complaint  Patient presents with  . Follow-up    on kidneys and pneumonia, still congested.     Subjective:  [name] is a 63 y.o. female present for follow up on elevated WBC and presumed pyelonepehritis at the time given symptoms. However pt has also been diagnosed with pna of her right lung by pulm in that timespan as well. She was treated beginning of the year with Levaquin for concern of pneumonia - CXR was clear. Immediately had symptoms of flank pain after completely Levaquin. She stated at that visit her lung symptoms had cleared.  Count collected that day was 28.5, RBCs 5.28, hemoglobin 17.0, hematocrit 51.3, MCV 97, platelets 210.  Neutrophils 90.1.  Lymphocytes 6.0.  She was advised to go to the emergency room and declined.  Urine culture negative.  Hematuria present.  Of note hematuria has been present for many years.  She used to follow with urology. She reports she has been taking the [facility] as prescribed.  She also received Rocephin 1 g in the office during initial visit, as well as Toradol injection for discomfort.  Patient states the next 2 days following her visit she still did not feel well and had discomfort in her right lumbar area.  Since then she has steadily improved.  She denies fever, chills, nausea or vomit.  Prior  note 05/17/2018: Pt presents for an OV with complaints of abdominal pain of of 2 days duration.  Associated symptoms include chills, low right sided back pain that radiates to RUQ/flank area. She thinks she had a kidney stone once many years ago. History of pyelonephritis, recurrent. Smoker. CT 2015 normal kidneys. All urines visualized in EMR have RBC present. She is established with urology (Dr. Gaynelle Arabian in the past).  Depression screen [facility] 2/9 10/28/2017 08/10/2017  Decreased Interest 0 0  Down, Depressed, Hopeless 0 0  PHQ - 2 Score 0 0    No Known Allergies Social History   Tobacco Use  . Smoking status: Current Every Day Smoker    Packs/day: 1.00    Years: 29.00    Pack years: 29.00    Types: Cigarettes    Start date: 06/01/1978  . Smokeless tobacco: Never Used  . Tobacco comment: Current 3/4PPD (12/12/16)  Substance Use Topics  . Alcohol use: Yes    Alcohol/week: 7.0 standard drinks    Types: 7 Glasses of wine per week   Past Medical History:  Diagnosis Date  . Basal cell carcinoma   . History of acute bronchitis 03/24/2011  . Lymphadenopathy, abdominal 03/2013   Noted on abd/pelv CT done to eval slow-to-resolve pyelo  . Meniere disorder 01/23/2012  . Osteoporosis 12/2014   Dr. Nori Riis, her GYN, dx'd her  . Recurrent pyelonephritis   . Seasonal allergic rhinitis   . Squamous cell carcinoma  in situ (SCCIS) of skin of lower leg 05/04/2018   Dr. Janan Ridge, [facility]   Past Surgical History:  Procedure Laterality Date  . biopsy on skin cancer    . DEXA  12/2014   T score spine -2.4, hip -3.5 (Dr. Nori Riis, [facility]  [name] DILATION AND CURETTAGE OF UTERUS  2009   Family History  Problem Relation Age of Onset  . Hypertension Mother   . Glaucoma Mother   . Irritable bowel syndrome Mother   . Arthritis Mother   . Kidney disease Mother   . Diabetes Mother   . Hypertension Father   . Heart disease Father        bypasses  . Cancer Father          sarcoma  . Cancer Maternal Grandmother        breast  . Heart disease Maternal Grandmother        CHF  . Stroke Maternal Grandmother        in his 74's  . Stroke Maternal Grandfather   . Other Paternal Grandmother        hardening of the arteries  . Heart attack Paternal Grandfather   . Colon cancer Paternal Aunt 69  . Stomach cancer Neg Hx    Allergies as of 05/24/2018   No Known Allergies     Medication List       Accurate as of May 24, 2018 10:16 AM. Always use your most recent med list.        AEROCHAMBER MV inhaler Use as instructed   alendronate 70 MG tablet Commonly known as:  FOSAMAX Take 1 tablet (70 mg total) by mouth every 7 (seven) days. Take with a full glass of water on an empty stomach.   azelastine 0.1 % nasal spray Commonly known as:  ASTELIN Place 1 spray into both nostrils 2 (two) times daily. Use in each nostril as directed   cefdinir 300 MG capsule Commonly known as:  OMNICEF Take 1 capsule (300 mg total) by mouth 2 (two) times daily.   fluorouracil 5 % cream Commonly known as:  EFUDEX APPLY1 APPLICATION ON THE SKIN AT BEDTIME FOR 4 WEEKS   montelukast 10 MG tablet Commonly known as:  SINGULAIR Take 1 tablet (10 mg total) by mouth at bedtime.   multivitamin tablet Take 1 tablet by mouth daily.   SYMBICORT 160-4.5 MCG/ACT inhaler Generic drug:  budesonide-formoterol TAKE 2 PUFFS BY MOUTH TWICE A DAY   tretinoin 0.025 % cream Commonly known as:  RETIN-A TAKE 1 APPLICATION THIN LAYER TO FACE NIGHTLY   VENTOLIN HFA 108 (90 Base) MCG/ACT inhaler Generic drug:  albuterol TAKE 2 PUFFS BY MOUTH EVERY 6 HOURS AS NEEDED FOR WHEEZE OR SHORTNESS OF BREATH   albuterol (2.5 MG/3ML) 0.083% nebulizer solution Commonly known as:  PROVENTIL Take 3 mLs (2.5 mg total) by nebulization every 6 (six) hours as needed for wheezing or shortness of breath.   VITAMIN D (CHOLECALCIFEROL) PO Take by mouth.       All past medical history,  surgical history, allergies, family history, immunizations andmedications were updated in the EMR today and reviewed under the history and medication portions of their EMR.     ROS: Negative, with the exception of above mentioned in HPI  Objective:  BP (!) 142/88 (BP Location: Left Arm, Patient Position: Sitting, Cuff Size: Normal)   Pulse 99   Temp 98.7 F (37.1 C) (Oral)   Resp 16  Ht 5\' 5"  (1.651 m)   Wt 109 lb (49.4 kg)   SpO2 97%   BMI 18.14 kg/m  Body mass index is 18.14 kg/m.  Gen: Afebrile. No acute distress.  Nontoxic in presentation, thin, Caucasian female.  Smoker. HENT: AT. [place]. MMM. Eyes:Pupils Equal Round Reactive to light, Extraocular movements intact,  Conjunctiva without redness, discharge or icterus. Neck/lymp/endocrine: Supple,no lymphadenopathy CV: RRR  Chest: CTAB, no wheeze or crackles Abd: Soft.  Flat. NTND. BS present.  No masses palpated.  MSK: No CVA tenderness bilaterally Skin: No rashes, purpura or petechiae.  Neuro:  Normal gait. PERLA. EOMi. Alert. Oriented.   No exam data present No results found. No results found for this or any previous visit (from the past 24 hour(s)).  Assessment/Plan: [name] is a 63 y.o. female present for OV for  Hematuria, unspecified type/Cigarette nicotine dependence with other nicotine-induced disorder/Flank pain -Patient symptoms are most improved today.  CBC has been repeated and levels returned to normal. -Discussed her symptoms of chronic hematuria, recurrent pyelonephritis, history of smoking and right flank pain.  Will order an ultrasound of her kidneys to further evaluate for possible cause of right flank pain and chronic hematuria. - Cytology - non gyn -Complete Omnicef prescription. - smoker female- chronic hematuria and history of recurrent pyelonephritis. -Depending upon ultrasound/urine results, referral back [facility]log[facility] ([facility]- [facility] dr has retired) may be indicated.     Reviewed expectations  re: course of current medical issues.  Discussed self-management of symptoms.  Outlined signs and symptoms indicating need for more acute intervention.  Patient verbalized understanding and all questions were answered.  Patient received an After-Visit Summary.   > 25 minutes spent with patient, >50% of time spent face to face   No orders of the defined types were placed in this encounter.    Note is dictated utilizing voice recognition software. Although note has been proof read prior to signing, occasional typographical errors still can be missed. If any questions arise, please do not hesitate to call for verification.   electronically signed by:  Howard Pouch, DO  [place]

## 2018-05-24 NOTE — Patient Instructions (Signed)
Monitor BP if routinely above 135/85 then be seen for blood pressure.  I will order an ultrasound of your kidneys to check on the potential cause of hematuria. They will call to schedule you.   We will call with the results of your urine cytology once we receive the results.   I am glad you are feeling better.

## 2018-05-25 ENCOUNTER — Ambulatory Visit
Admission: RE | Admit: 2018-05-25 | Discharge: 2018-05-25 | Disposition: A | Discharge status: Home / Self Care | Payer: BLUE CROSS/BLUE SHIELD | Source: Ambulatory Visit | Attending: Family Medicine | Admitting: Family Medicine

## 2018-05-25 DIAGNOSIS — R109 Unspecified abdominal pain: Secondary | ICD-10-CM

## 2018-05-25 DIAGNOSIS — R319 Hematuria, unspecified: Secondary | ICD-10-CM

## 2018-05-25 DIAGNOSIS — F17218 Nicotine dependence, cigarettes, with other nicotine-induced disorders: Secondary | ICD-10-CM

## 2018-05-26 ENCOUNTER — Telehealth: Payer: Self-pay | Admitting: Family Medicine

## 2018-05-26 DIAGNOSIS — R319 Hematuria, unspecified: Secondary | ICD-10-CM

## 2018-05-26 DIAGNOSIS — F17200 Nicotine dependence, unspecified, uncomplicated: Secondary | ICD-10-CM

## 2018-05-26 DIAGNOSIS — N12 Tubulo-interstitial nephritis, not specified as acute or chronic: Secondary | ICD-10-CM

## 2018-05-26 NOTE — Telephone Encounter (Signed)
Called patient and left detailed message on answering machine.  Explained to patient that since her previous urologist is not seeing patients any longer that she still needed to maintain a urologist on her care team. Also noted that the referral was not urgent and [facility] is booking out quite a ways. Explained that its best to go ahead and make the appointment and continue seeing urology given her health history. Urology will be able to give her a definitive diagnosis once she establishes with them.

## 2018-05-26 NOTE — Telephone Encounter (Signed)
Please inform patient the following information: -Her kidney US showed no masses, infections or hydronephrosis (swelling).  - Bilateral ureter, the drainage system from kidneys to her bladder, is functioning normal- no blockage/stones.  - Her bladder appeared "normal" for what can be seen on [place] with the bladder.  - Her urine cytology did not have any abnormal/cancerous cells in the specimen.  - I did place the referral back to urology for further evaluation  on her chronic hematuria and recurrent pyelonephritis. Since these test are normal, in it not urgent- they will call her to set up appt.

## 2018-05-26 NOTE — Telephone Encounter (Signed)
Because she has chronic blood in her urine and recurrent pyelonephritis (kidney infection/pain) without known cause of occurrence in  a pt that is at increased risk for malignancy and/or renal stone. If she is looking for a diagnosis as her "definite reason" - I can not provide one to her- as that would require further evaluation by the specialist. This is outside the scope of family medicine.

## 2018-05-26 NOTE — Telephone Encounter (Signed)
Pt advised. She asked why she needs to see a urologist if all her test have came back normal. I advised her that we refer pts to specialist when we are unable to determine cause based off our knowledge and testing. Pt was not satisfied with this answer and asked for Dr. Raoul Pitch to give her a "definate why" she needs to see a urologist.

## 2018-05-27 ENCOUNTER — Encounter: Payer: Self-pay | Admitting: Family Medicine

## 2018-06-14 ENCOUNTER — Encounter: Payer: Self-pay | Admitting: Pulmonary Disease

## 2018-06-14 ENCOUNTER — Ambulatory Visit (INDEPENDENT_AMBULATORY_CARE_PROVIDER_SITE_OTHER): Payer: BLUE CROSS/BLUE SHIELD | Admitting: Pulmonary Disease

## 2018-06-14 ENCOUNTER — Ambulatory Visit (INDEPENDENT_AMBULATORY_CARE_PROVIDER_SITE_OTHER)
Admission: RE | Admit: 2018-06-14 | Discharge: 2018-06-14 | Disposition: A | Discharge status: Home / Self Care | Payer: BLUE CROSS/BLUE SHIELD | Source: Ambulatory Visit | Attending: Pulmonary Disease | Admitting: Pulmonary Disease

## 2018-06-14 VITALS — BP 120/80 | HR 118 | Temp 100.2°F | Ht 65.0 in | Wt 110.0 lb

## 2018-06-14 DIAGNOSIS — R05 Cough: Secondary | ICD-10-CM

## 2018-06-14 DIAGNOSIS — R509 Fever, unspecified: Secondary | ICD-10-CM | POA: Diagnosis not present

## 2018-06-14 DIAGNOSIS — R059 Cough, unspecified: Secondary | ICD-10-CM

## 2018-06-14 DIAGNOSIS — J439 Emphysema, unspecified: Secondary | ICD-10-CM

## 2018-06-14 DIAGNOSIS — J479 Bronchiectasis, uncomplicated: Secondary | ICD-10-CM

## 2018-06-14 DIAGNOSIS — Z720 Tobacco use: Secondary | ICD-10-CM | POA: Diagnosis not present

## 2018-06-14 DIAGNOSIS — J449 Chronic obstructive pulmonary disease, unspecified: Secondary | ICD-10-CM

## 2018-06-14 LAB — COMPREHENSIVE METABOLIC PANEL
ALT: 17 U/L (ref 0–35)
AST: 25 U/L (ref 0–37)
Albumin: 4.1 g/dL (ref 3.5–5.2)
Alkaline Phosphatase: 56 U/L (ref 39–117)
BUN: 13 mg/dL (ref 6–23)
CALCIUM: 9.5 mg/dL (ref 8.4–10.5)
CO2: 26 mEq/L (ref 19–32)
Chloride: 101 mEq/L (ref 96–112)
Creatinine, Ser: 0.82 mg/dL (ref 0.40–1.20)
GFR: 70.59 mL/min (ref >60.00)
Glucose, Bld: 96 mg/dL (ref 70–99)
Potassium: 3.9 mEq/L (ref 3.5–5.1)
Sodium: 137 mEq/L (ref 135–145)
Total Bilirubin: 0.8 mg/dL (ref 0.2–1.2)
Total Protein: 6.8 g/dL (ref 6.0–8.3)

## 2018-06-14 LAB — CBC WITH DIFFERENTIAL/PLATELET
Basophils Absolute: 0.1 10*3/uL (ref 0.0–0.1)
Basophils Relative: 0.5 % (ref 0.0–3.0)
Eosinophils Absolute: 0 10*3/uL (ref 0.0–0.7)
Eosinophils Relative: 0.2 % (ref 0.0–5.0)
HCT: 48.3 % — ABNORMAL HIGH (ref 36.0–46.0)
Hemoglobin: 16.1 g/dL — ABNORMAL HIGH (ref 12.0–15.0)
Lymphocytes Relative: 11.5 % — ABNORMAL LOW (ref 12.0–46.0)
Lymphs Abs: 1.3 10*3/uL (ref 0.7–4.0)
MCHC: 33.4 g/dL (ref 30.0–36.0)
MCV: 97.8 fl (ref 78.0–100.0)
MONOS PCT: 7.9 % (ref 3.0–12.0)
Monocytes Absolute: 0.9 10*3/uL (ref 0.1–1.0)
Neutro Abs: 9.2 10*3/uL — ABNORMAL HIGH (ref 1.4–7.7)
Neutrophils Relative %: 79.9 % — ABNORMAL HIGH (ref 43.0–77.0)
Platelets: 171 10*3/uL (ref 150.0–400.0)
RBC: 4.93 Mil/uL (ref 3.87–5.11)
RDW: 14.2 % (ref 11.5–15.5)
WBC: 11.5 10*3/uL — ABNORMAL HIGH (ref 4.0–10.5)

## 2018-06-14 MED ORDER — LEVOFLOXACIN 500 MG PO TABS: 500.0000 mg | ORAL_TABLET | Freq: Every day | ORAL | 0 refills | Status: DC

## 2018-06-14 NOTE — Addendum Note (Signed)
Addended by: Suzzanne Cloud E on: 06/14/2018 11:48 AM   Modules accepted: Orders

## 2018-06-14 NOTE — Progress Notes (Signed)
[facility]  Chief Complaint  Patient presents with  . Follow-up    pt states no new or worsening symptoms, breathing at baseline    Constitutional:  BP 120/80 (BP Location: Right Arm, Cuff Size: Normal)   Pulse (!) 118   Temp 100.2 F (37.9 C) (Oral)   Ht 5\' 5"  (1.651 m)   Wt 110 lb (49.9 kg)   SpO2 93%   BMI 18.30 kg/m   Past Medical History:  Pyelonephritis, Osteoporosis, Meniere disorder, Basal cell carcinoma  Brief Summary:  [name] is a 63 y.o. female smokerwithCOPD/emphysema, bronchiectasis, and lung nodules.  She was seen by [name] in January.  Treated with antibiotics.  CXR from 05/20/18 (reviewed by me) showed right basilar infiltrate.  She was doing better until yesterday.  Developed fever, chills.  Has cough with clear sputum.  Also has sinus congestion.  Not having sore throat, hemoptysis, abdominal pain, or leg swelling.  Feels that symbicort and singulair help  Still smoking cigarettes.   Physical Exam:   Appearance - feels warm  ENMT - clear nasal mucosa, midline nasal  septum, no oral exudates, no LAN, trachea midline  Respiratory - normal chest wall, normal respiratory effort, no accessory muscle use, coarse breath sounds b/l that clear with coughing  CV - s1s2 regular rate and rhythm, no murmurs, no peripheral edema, radial pulses symmetric  GI - soft, non tender, no masses  Lymph - no adenopathy noted in neck and axillary areas  MSK - normal gait  Ext - no cyanosis, clubbing, or joint inflammation noted  Skin - no rashes, lesions, or ulcers  Neuro - normal strength, oriented x 3  Psych - normal mood and affect  Assessment/Plan:   Has recurrent respiratory infection with cough and fever. - her CXR from January showed Rt base infiltrate - will repeat chest xray and lab work today - don't think she needs prednisone - will give course of levaquin  COPD with chronic bronchitis, emphysema  and  bronchiectasis. - continue symbicort, singulair, and prn albuterol - will need to check A1AT level when more stable  Tobacco abuse. - will need to discuss smoking cessation options again when she is more stable  Allergic rhinitis. - continue astelin, singulair   She has requested that health information not be shared with her daughter without her prior approval.   Patient Instructions  Chest xray and lab tests today  Levofloxacin 500 mg daily for 7 days  Follow up in 3 weeks with [name] or Nurse practitioner    Kathleen Mires, MD [place] Pager: [phone] 06/14/2018, 11:28 AM  Flow Sheet     Pulmonary tests:  PFT 12/15/08 FEV1 2.20(90%), FVC 3.68(113%), FEV1% 60, TLC 6.54(132%), DLCO 75%, no BD response Sputum culture 06/27/16 >> Moraxella catarrhalis PFT 08/25/16 >> FEV1 1.55 (59%), FEV1% 57, TLC 5.33 (103%), DLCO 52%  Cardiac tests:  CT chest 04/21/16 >> borderline LAN, nodules in lung bases with tree in bud CT chest 10/22/16 >> moderate centrilobular emphysema, 1.2 cm nodule LUL, tree in bud, RLL BTX CT chest 01/26/17 >> moderate centrilobular emphysema, mild b/l BTX with tree in bud, LUL nodule resolved, stable 7 mm RLL nodule since 2015   Medications:   Allergies as of 06/14/2018   No Known Allergies     Medication List       Accurate as of June 14, 2018 11:28 AM. Always use your most recent med list.  AEROCHAMBER MV inhaler Use as instructed   alendronate 70 MG tablet Commonly known as:  FOSAMAX Take 1 tablet (70 mg total) by mouth every 7 (seven) days. Take with a full glass of water on an empty stomach.   azelastine 0.1 % nasal spray Commonly known as:  ASTELIN Place 1 spray into both nostrils 2 (two) times daily. Use in each nostril as directed   fluorouracil 5 % cream Commonly known as:  EFUDEX APPLY1 APPLICATION ON THE SKIN AT BEDTIME FOR 4 WEEKS   levofloxacin 500 MG tablet Commonly known as:   LEVAQUIN Take 1 tablet (500 mg total) by mouth daily.   montelukast 10 MG tablet Commonly known as:  SINGULAIR Take 1 tablet (10 mg total) by mouth at bedtime.   multivitamin tablet Take 1 tablet by mouth daily.   SYMBICORT 160-4.5 MCG/ACT inhaler Generic drug:  budesonide-formoterol TAKE 2 PUFFS BY MOUTH TWICE A DAY   tretinoin 0.025 % cream Commonly known as:  RETIN-A TAKE 1 APPLICATION THIN LAYER TO FACE NIGHTLY   VENTOLIN HFA 108 (90 Base) MCG/ACT inhaler Generic drug:  albuterol TAKE 2 PUFFS BY MOUTH EVERY 6 HOURS AS NEEDED FOR WHEEZE OR SHORTNESS OF BREATH   albuterol (2.5 MG/3ML) 0.083% nebulizer solution Commonly known as:  PROVENTIL Take 3 mLs (2.5 mg total) by nebulization every 6 (six) hours as needed for wheezing or shortness of breath.   VITAMIN D (CHOLECALCIFEROL) PO Take by mouth.       Past Surgical History:  She  has a past surgical history that includes Dilation and curettage of uterus (2009); DEXA (12/2014); and Skin lesion excision (Left, 05/24/2018).  Family History:  Her family history includes Arthritis in her mother; Cancer in her father and maternal grandmother; Colon cancer (age of onset: 48) in her paternal aunt; Diabetes in her mother; Glaucoma in her mother; Heart attack in her paternal grandfather; Heart disease in her father and maternal grandmother; Hypertension in her father and mother; Irritable bowel syndrome in her mother; Kidney disease in her mother; Other in her paternal grandmother; Stroke in her maternal grandfather and maternal grandmother.  Social History:  She  reports that she has been smoking cigarettes. She started smoking about 40 years ago. She has a 29.00 pack-year smoking history. She has never used smokeless tobacco. She reports current alcohol use of about 7.0 standard drinks of alcohol per week. She reports that she does not use drugs.

## 2018-06-14 NOTE — Patient Instructions (Signed)
Chest xray and lab tests today  Levofloxacin 500 mg daily for 7 days  Follow up in 3 weeks with Dr. Halford Chessman or Nurse practitioner

## 2018-06-15 ENCOUNTER — Telehealth: Payer: Self-pay | Admitting: Pulmonary Disease

## 2018-06-15 DIAGNOSIS — D225 Melanocytic nevi of trunk: Secondary | ICD-10-CM | POA: Diagnosis not present

## 2018-06-15 DIAGNOSIS — L814 Other melanin hyperpigmentation: Secondary | ICD-10-CM | POA: Diagnosis not present

## 2018-06-15 DIAGNOSIS — C44729 Squamous cell carcinoma of skin of left lower limb, including hip: Secondary | ICD-10-CM | POA: Diagnosis not present

## 2018-06-15 DIAGNOSIS — L570 Actinic keratosis: Secondary | ICD-10-CM | POA: Diagnosis not present

## 2018-06-15 DIAGNOSIS — Z85828 Personal history of other malignant neoplasm of skin: Secondary | ICD-10-CM | POA: Diagnosis not present

## 2018-06-15 DIAGNOSIS — D0461 Carcinoma in situ of skin of right upper limb, including shoulder: Secondary | ICD-10-CM | POA: Diagnosis not present

## 2018-06-15 DIAGNOSIS — D485 Neoplasm of uncertain behavior of skin: Secondary | ICD-10-CM | POA: Diagnosis not present

## 2018-06-15 NOTE — Telephone Encounter (Signed)
Dg Chest 2 View  Result Date: 06/14/2018 CLINICAL DATA:  Cough, fever, and chills for 1 day. Emphysema. Smoker. EXAM: CHEST - 2 VIEW COMPARISON:  05/20/2018 and 05/07/2018 FINDINGS: The heart size and mediastinal contours are within normal limits. Emphysema again demonstrated. Mild bibasilar scarring seen. No evidence of pulmonary infiltrate or edema. No evidence of pleural effusion. The visualized skeletal structures are unremarkable. IMPRESSION: Emphysema. No active lung disease. Electronically Signed   By: Earle Gell M.D.   On: 06/14/2018 15:06    CBC Latest Ref Rng & Units 06/14/2018 05/20/2018 05/17/2018  WBC 4.0 - 10.5 K/uL 11.5(H) 7.7 28.5 Repeated and verified X2.(HH)  Hemoglobin 12.0 - 15.0 g/dL 16.1(H) 15.5(H) 17.0(H)  Hematocrit 36.0 - 46.0 % 48.3(H) 46.5(H) 51.3(H)  Platelets 150.0 - 400.0 K/uL 171.0 221.0 210.0    CMP Latest Ref Rng & Units 06/14/2018 05/17/2018 10/28/2017  Glucose 70 - 99 mg/dL 96 112(H) 89  BUN 6 - 23 mg/dL 13 15 10   Creatinine 0.40 - 1.20 mg/dL 0.82 0.85 0.77  Sodium 135 - 145 mEq/L 137 135 140  Potassium 3.5 - 5.1 mEq/L 3.9 4.3 4.8  Chloride 96 - 112 mEq/L 101 96 101  CO2 19 - 32 mEq/L 26 29 31   Calcium 8.4 - 10.5 mg/dL 9.5 9.6 9.7  Total Protein 6.0 - 8.3 g/dL 6.8 - -  Total Bilirubin 0.2 - 1.2 mg/dL 0.8 - -  Alkaline Phos 39 - 117 U/L 56 - -  AST 0 - 37 U/L 25 - -  ALT 0 - 35 U/L 17 - -    Please let her know her chest xray didn't show any evidence for pneumonia.  Labs showed elevated hemoglobin which can be seen if she isn't getting enough oxygen when asleep.  Will discuss further evaluation of this at next visit.

## 2018-06-15 NOTE — Telephone Encounter (Signed)
Called and spoke with patient regarding results.  Informed the patient of results and recommendations today. Scheduled appt with VS tomorrow 06/16/2018 at 9:15am Pt verbalized understanding and denied any questions or concerns at this time.  Nothing further needed.

## 2018-06-16 ENCOUNTER — Ambulatory Visit (INDEPENDENT_AMBULATORY_CARE_PROVIDER_SITE_OTHER): Payer: BLUE CROSS/BLUE SHIELD | Admitting: Pulmonary Disease

## 2018-06-16 ENCOUNTER — Encounter: Payer: Self-pay | Admitting: Pulmonary Disease

## 2018-06-16 VITALS — BP 122/68 | HR 104 | Ht 65.0 in | Wt 109.0 lb

## 2018-06-16 DIAGNOSIS — R509 Fever, unspecified: Secondary | ICD-10-CM | POA: Diagnosis not present

## 2018-06-16 DIAGNOSIS — J479 Bronchiectasis, uncomplicated: Secondary | ICD-10-CM

## 2018-06-16 DIAGNOSIS — J449 Chronic obstructive pulmonary disease, unspecified: Secondary | ICD-10-CM | POA: Diagnosis not present

## 2018-06-16 DIAGNOSIS — J40 Bronchitis, not specified as acute or chronic: Secondary | ICD-10-CM | POA: Diagnosis not present

## 2018-06-16 DIAGNOSIS — Z720 Tobacco use: Secondary | ICD-10-CM

## 2018-06-16 NOTE — Progress Notes (Signed)
[facility]  Chief Complaint  Patient presents with  . Follow-up    Pt has had fever since 06/12/2018 of 102.8, today fever is 99.4. Pt has dry cough.     Constitutional:  BP 122/68 (BP Location: Left Arm, Cuff Size: Normal)   Pulse (!) 104   Ht 5\' 5"  (1.651 m)   Wt 109 lb (49.4 kg)   SpO2 95%   BMI 18.14 kg/m   Past Medical History:  Pyelonephritis, Osteoporosis, Meniere disorder, Basal cell carcinoma  Brief Summary:  [name] is a 63 y.o. female smokerwithCOPD/emphysema, bronchiectasis, and lung nodules.  She is still having fever.  Was up to 102.4 last night.  Improves with tylenol.  Has dry cough and feels achy.  Not having wheeze, dyspnea, sinus congestion, sore throat, or sputum.  Has sore feeling in Lt upper quadrant.  No having diarrhea or dysuria.  CXR 06/14/18 (reviewed by me) >> hyperinflation, no infiltrates.  Physical Exam:   Appearance - well kempt   ENMT - no sinus tenderness, no nasal discharge, no oral exudate  Neck - no masses, trachea midline, no thyromegaly, no elevation in JVP  Respiratory - normal appearance of chest wall, normal respiratory effort w/o accessory muscle use, no dullness on percussion, no wheezing or rales  CV - s1s2 regular rate and rhythm, no murmurs, no peripheral edema, radial pulses symmetric  GI - soft, non tender, + bowel sounds, no rebound, no guarding, no CVA tenderness  Lymph - no adenopathy noted in neck and axillary areas  MSK - normal gait  Ext - no cyanosis, clubbing, or joint inflammation noted  Skin - no rashes, lesions, or ulcers  Neuro - normal strength, oriented x 3  Psych - normal mood and affect    CBC Latest Ref Rng & Units 06/14/2018 05/20/2018 05/17/2018  WBC 4.0 - 10.5 K/uL 11.5(H) 7.7 28.5 Repeated and verified X2.(HH)  Hemoglobin 12.0 - 15.0 g/dL 16.1(H) 15.5(H) 17.0(H)  Hematocrit 36.0 - 46.0 % 48.3(H) 46.5(H) 51.3(H)  Platelets 150.0 - 400.0 K/uL  171.0 221.0 210.0    CMP Latest Ref Rng & Units 06/14/2018 05/17/2018 10/28/2017  Glucose 70 - 99 mg/dL 96 112(H) 89  BUN 6 - 23 mg/dL 13 15 10   Creatinine 0.40 - 1.20 mg/dL 0.82 0.85 0.77  Sodium 135 - 145 mEq/L 137 135 140  Potassium 3.5 - 5.1 mEq/L 3.9 4.3 4.8  Chloride 96 - 112 mEq/L 101 96 101  CO2 19 - 32 mEq/L 26 29 31   Calcium 8.4 - 10.5 mg/dL 9.5 9.6 9.7  Total Protein 6.0 - 8.3 g/dL 6.8 - -  Total Bilirubin 0.2 - 1.2 mg/dL 0.8 - -  Alkaline Phos 39 - 117 U/L 56 - -  AST 0 - 37 U/L 25 - -  ALT 0 - 35 U/L 17 - -    Dg Chest 2 View  Result Date: 06/14/2018 CLINICAL DATA:  Cough, fever, and chills for 1 day. Emphysema. Smoker. EXAM: CHEST - 2 VIEW COMPARISON:  05/20/2018 and 05/07/2018 FINDINGS: The heart size and mediastinal contours are within normal limits. Emphysema again demonstrated. Mild bibasilar scarring seen. No evidence of pulmonary infiltrate or edema. No evidence of pleural effusion. The visualized skeletal structures are unremarkable. IMPRESSION: Emphysema. No active lung disease. Electronically Signed   By: Earle Gell M.D.   On: 06/14/2018 15:06     Assessment/Plan:   Has recurrent respiratory infection with cough and fever. - likely viral upper  respiratory infection with possible bacterial superinfection - continue levaquin to complete course - prn acetaminophen, ibuprofen for fever, muscle aches, and pain/discomfort relief  Lt upper quadrant tenderness. - physical exam benign - prn analgesics  COPD with chronic bronchitis, emphysema and  bronchiectasis. - continue symbicort, singulair, prn albuterol - will need to check A1AT level when more stable  Tobacco abuse. - will need to discuss smoking cessation options again when she is more stable  Allergic rhinitis. - continue astelin, singulair  Polycythemia. - will need to repeat CBC at next visit - if Hb remains elevated, then will need to have further assessment of overnight oximetry   She has  requested that health information not be shared with her daughter without her prior approval.   Patient Instructions  Follow up in 4 weeks    Chesley Mires, MD [place] Pager: [phone] 06/16/2018, 9:31 AM  Flow Sheet     Pulmonary tests:  PFT 12/15/08 FEV1 2.20(90%), FVC 3.68(113%), FEV1% 60, TLC 6.54(132%), DLCO 75%, no BD response Sputum culture 06/27/16 >> Moraxella catarrhalis PFT 08/25/16 >> FEV1 1.55 (59%), FEV1% 57, TLC 5.33 (103%), DLCO 52%  Cardiac tests:  CT chest 04/21/16 >> borderline LAN, nodules in lung bases with tree in bud CT chest 10/22/16 >> moderate centrilobular emphysema, 1.2 cm nodule LUL, tree in bud, RLL BTX CT chest 01/26/17 >> moderate centrilobular emphysema, mild b/l BTX with tree in bud, LUL nodule resolved, stable 7 mm RLL nodule since 2015   Medications:   Allergies as of 06/16/2018   No Known Allergies     Medication List       Accurate as of June 16, 2018  9:31 AM. Always use your most recent med list.        AEROCHAMBER MV inhaler Use as instructed   alendronate 70 MG tablet Commonly known as:  FOSAMAX Take 1 tablet (70 mg total) by mouth every 7 (seven) days. Take with a full glass of water on an empty stomach.   azelastine 0.1 % nasal spray Commonly known as:  ASTELIN Place 1 spray into both nostrils 2 (two) times daily. Use in each nostril as directed   fluorouracil 5 % cream Commonly known as:  EFUDEX APPLY1 APPLICATION ON THE SKIN AT BEDTIME FOR 4 WEEKS   levofloxacin 500 MG tablet Commonly known as:  LEVAQUIN Take 1 tablet (500 mg total) by mouth daily.   montelukast 10 MG tablet Commonly known as:  SINGULAIR Take 1 tablet (10 mg total) by mouth at bedtime.   multivitamin tablet Take 1 tablet by mouth daily.   SYMBICORT 160-4.5 MCG/ACT inhaler Generic drug:  budesonide-formoterol TAKE 2 PUFFS BY MOUTH TWICE A DAY   tretinoin 0.025 % cream Commonly known as:  RETIN-A TAKE 1  APPLICATION THIN LAYER TO FACE NIGHTLY   VENTOLIN HFA 108 (90 Base) MCG/ACT inhaler Generic drug:  albuterol TAKE 2 PUFFS BY MOUTH EVERY 6 HOURS AS NEEDED FOR WHEEZE OR SHORTNESS OF BREATH   albuterol (2.5 MG/3ML) 0.083% nebulizer solution Commonly known as:  PROVENTIL Take 3 mLs (2.5 mg total) by nebulization every 6 (six) hours as needed for wheezing or shortness of breath.   VITAMIN D (CHOLECALCIFEROL) PO Take by mouth.       Past Surgical History:  She  has a past surgical history that includes Dilation and curettage of uterus (2009); DEXA (12/2014); and Skin lesion excision (Left, 05/24/2018).  Family History:  Her family history includes Arthritis in her mother; Cancer in  her father and maternal grandmother; Colon cancer (age of onset: 77) in her paternal aunt; Diabetes in her mother; Glaucoma in her mother; Heart attack in her paternal grandfather; Heart disease in her father and maternal grandmother; Hypertension in her father and mother; Irritable bowel syndrome in her mother; Kidney disease in her mother; Other in her paternal grandmother; Stroke in her maternal grandfather and maternal grandmother.  Social History:  She  reports that she has been smoking cigarettes. She started smoking about 40 years ago. She has a 29.00 pack-year smoking history. She has never used smokeless tobacco. She reports current alcohol use of about 7.0 standard drinks of alcohol per week. She reports that she does not use drugs.

## 2018-06-16 NOTE — Patient Instructions (Signed)
Follow-up in 4 weeks

## 2018-06-30 ENCOUNTER — Encounter: Payer: Self-pay | Admitting: Family Medicine

## 2018-07-14 ENCOUNTER — Other Ambulatory Visit: Payer: Self-pay

## 2018-07-14 ENCOUNTER — Ambulatory Visit (INDEPENDENT_AMBULATORY_CARE_PROVIDER_SITE_OTHER): Payer: BLUE CROSS/BLUE SHIELD | Admitting: Pulmonary Disease

## 2018-07-14 ENCOUNTER — Encounter: Payer: Self-pay | Admitting: Pulmonary Disease

## 2018-07-14 VITALS — BP 136/88 | HR 102 | Ht 65.0 in | Wt 110.0 lb

## 2018-07-14 DIAGNOSIS — J449 Chronic obstructive pulmonary disease, unspecified: Secondary | ICD-10-CM | POA: Diagnosis not present

## 2018-07-14 DIAGNOSIS — R000 Tachycardia, unspecified: Secondary | ICD-10-CM | POA: Diagnosis not present

## 2018-07-14 DIAGNOSIS — J479 Bronchiectasis, uncomplicated: Secondary | ICD-10-CM

## 2018-07-14 DIAGNOSIS — D751 Secondary polycythemia: Secondary | ICD-10-CM | POA: Diagnosis not present

## 2018-07-14 DIAGNOSIS — F17200 Nicotine dependence, unspecified, uncomplicated: Secondary | ICD-10-CM

## 2018-07-14 DIAGNOSIS — J439 Emphysema, unspecified: Secondary | ICD-10-CM

## 2018-07-14 LAB — CBC
HCT: 47.3 % — ABNORMAL HIGH (ref 36.0–46.0)
Hemoglobin: 15.8 g/dL — ABNORMAL HIGH (ref 12.0–15.0)
MCHC: 33.5 g/dL (ref 30.0–36.0)
MCV: 97.3 fl (ref 78.0–100.0)
Platelets: 230 10*3/uL (ref 150.0–400.0)
RBC: 4.86 Mil/uL (ref 3.87–5.11)
RDW: 13.8 % (ref 11.5–15.5)
WBC: 8.1 10*3/uL (ref 4.0–10.5)

## 2018-07-14 LAB — BASIC METABOLIC PANEL
BUN: 21 mg/dL (ref 6–23)
CO2: 30 mEq/L (ref 19–32)
Calcium: 9.9 mg/dL (ref 8.4–10.5)
Chloride: 101 mEq/L (ref 96–112)
Creatinine, Ser: 1.19 mg/dL (ref 0.40–1.20)
GFR: 45.92 mL/min — ABNORMAL LOW (ref >60.00)
Glucose, Bld: 106 mg/dL — ABNORMAL HIGH (ref 70–99)
Potassium: 4 mEq/L (ref 3.5–5.1)
Sodium: 138 mEq/L (ref 135–145)

## 2018-07-14 LAB — MAGNESIUM: Magnesium: 2 mg/dL (ref 1.5–2.5)

## 2018-07-14 MED ORDER — AZELASTINE HCL 0.1 % NA SOLN: 1.0000 | Freq: Two times a day (BID) | NASAL | 5 refills | Status: DC

## 2018-07-14 NOTE — Patient Instructions (Signed)
Lab tests today  Follow up in 2 months

## 2018-07-14 NOTE — Addendum Note (Signed)
Addended by: Suzzanne Cloud E on: 07/14/2018 10:28 AM   Modules accepted: Orders

## 2018-07-14 NOTE — Progress Notes (Signed)
[facility]  Chief Complaint  Patient presents with  . Follow-up    Pt has improved since last ov.    Constitutional:  BP 136/88 (BP Location: Left Arm, Cuff Size: Normal)   Pulse (!) 102   Ht 5\' 5"  (1.651 m)   Wt 110 lb (49.9 kg)   SpO2 96%   BMI 18.30 kg/m   Past Medical History:  Pyelonephritis, Osteoporosis, Meniere disorder, Basal cell carcinoma  Brief Summary:  [name] is a 63 y.o. female smokerwithCOPD/emphysema, bronchiectasis, and lung nodules.  Her son in law passed away recently from overdose.  She has been under lots of stress because of this.  Still gets intermittent temperature up to 6F.  Feels better otherwise.  Not having as much cough or sputum.  No having wheeze, or chest pain.  Has noticed her heart rate is faster than usual.  Not having dizziness or leg swelling.  Physical Exam:   Appearance - thin  ENMT - no sinus tenderness, no nasal discharge, no oral exudate  Neck - no masses, trachea midline, no thyromegaly, no elevation in JVP  Respiratory - normal appearance of chest wall, normal respiratory effort w/o accessory muscle use, no dullness on percussion, no wheezing or rales, decreased breath sounds  CV - irregular, tachycardic, no murmurs, no peripheral edema, radial pulses symmetric  GI - soft, non tender  Lymph - no adenopathy noted in neck and axillary areas  MSK - normal gait  Ext - no cyanosis, clubbing, or joint inflammation noted  Skin - no rashes, lesions, or ulcers  Neuro - normal strength, oriented x 3  Psych - normal mood and affect al edema, radial pulses symmetric   ECG shows sinus rhythm with frequent PVCs  Assessment/Plan:   Recurrent respiratory infections. - improved after recent ABx - if fever and sputum production recur, then will will need sputum sampling  COPD with chronic bronchitis, emphysema and  bronchiectasis. - symbicort, singulair, prn albuterol -  check A1AT level  Tobacco abuse. - she will try to gradually quit on her own  Allergic rhinitis. - astelin, singulair  Polycythemia. - repeat CBC - if Hb remains elevated, then will need overnight oximetry  Frequent PVCs. Plan - will check electrolytes  She has requested that health information not be shared with her daughter without her prior approval.   Patient Instructions  Lab tests today  Follow up in 2 months   Chesley Mires, MD [place] Pager: [phone] 07/14/2018, 9:54 AM  Flow Sheet     Pulmonary tests:  PFT 12/15/08 FEV1 2.20(90%), FVC 3.68(113%), FEV1% 60, TLC 6.54(132%), DLCO 75%, no BD response Sputum culture 06/27/16 >> Moraxella catarrhalis PFT 08/25/16 >> FEV1 1.55 (59%), FEV1% 57, TLC 5.33 (103%), DLCO 52%  Cardiac tests:  CT chest 04/21/16 >> borderline LAN, nodules in lung bases with tree in bud CT chest 10/22/16 >> moderate centrilobular emphysema, 1.2 cm nodule LUL, tree in bud, RLL BTX CT chest 01/26/17 >> moderate centrilobular emphysema, mild b/l BTX with tree in bud, LUL nodule resolved, stable 7 mm RLL nodule since 2015   Medications:   Allergies as of 07/14/2018   No Known Allergies     Medication List       Accurate as of July 14, 2018  9:54 AM. Always use your most recent med list.        AeroChamber MV inhaler Use as instructed   alendronate 70 MG tablet Commonly known as:  FOSAMAX Take 1 tablet (70 mg total) by mouth every 7 (seven) days. Take with a full glass of water on an empty stomach.   azelastine 0.1 % nasal spray Commonly known as:  ASTELIN Place 1 spray into both nostrils 2 (two) times daily. Use in each nostril as directed   fluorouracil 5 % cream Commonly known as:  EFUDEX APPLY1 APPLICATION ON THE SKIN AT BEDTIME FOR 4 WEEKS   montelukast 10 MG tablet Commonly known as:  SINGULAIR Take 1 tablet (10 mg total) by mouth at bedtime.   multivitamin tablet Take 1 tablet by mouth  daily.   Symbicort 160-4.5 MCG/ACT inhaler Generic drug:  budesonide-formoterol TAKE 2 PUFFS BY MOUTH TWICE A DAY   tretinoin 0.025 % cream Commonly known as:  RETIN-A TAKE 1 APPLICATION THIN LAYER TO FACE NIGHTLY   Ventolin HFA 108 (90 Base) MCG/ACT inhaler Generic drug:  albuterol TAKE 2 PUFFS BY MOUTH EVERY 6 HOURS AS NEEDED FOR WHEEZE OR SHORTNESS OF BREATH   albuterol (2.5 MG/3ML) 0.083% nebulizer solution Commonly known as:  PROVENTIL Take 3 mLs (2.5 mg total) by nebulization every 6 (six) hours as needed for wheezing or shortness of breath.   VITAMIN D (CHOLECALCIFEROL) PO Take by mouth.       Past Surgical History:  She  has a past surgical history that includes Dilation and curettage of uterus (2009); DEXA (12/2014); and Skin lesion excision (Left, 05/24/2018).  Family History:  Her family history includes Arthritis in her mother; Cancer in her father and maternal grandmother; Colon cancer (age of onset: 43) in her paternal aunt; Diabetes in her mother; Glaucoma in her mother; Heart attack in her paternal grandfather; Heart disease in her father and maternal grandmother; Hypertension in her father and mother; Irritable bowel syndrome in her mother; Kidney disease in her mother; Other in her paternal grandmother; Stroke in her maternal grandfather and maternal grandmother.  Social History:  She  reports that she has been smoking cigarettes. She started smoking about 40 years ago. She has a 29.00 pack-year smoking history. She has never used smokeless tobacco. She reports current alcohol use of about 7.0 standard drinks of alcohol per week. She reports that she does not use drugs.

## 2018-07-16 ENCOUNTER — Other Ambulatory Visit: Payer: Self-pay | Admitting: *Deleted

## 2018-07-16 ENCOUNTER — Ambulatory Visit (INDEPENDENT_AMBULATORY_CARE_PROVIDER_SITE_OTHER)
Admission: RE | Admit: 2018-07-16 | Discharge: 2018-07-16 | Disposition: A | Discharge status: Home / Self Care | Payer: BLUE CROSS/BLUE SHIELD | Source: Ambulatory Visit | Attending: Family Medicine | Admitting: Family Medicine

## 2018-07-16 ENCOUNTER — Other Ambulatory Visit: Payer: Self-pay

## 2018-07-16 DIAGNOSIS — M79671 Pain in right foot: Secondary | ICD-10-CM

## 2018-07-16 DIAGNOSIS — S99911A Unspecified injury of right ankle, initial encounter: Secondary | ICD-10-CM | POA: Diagnosis not present

## 2018-07-16 DIAGNOSIS — M25571 Pain in right ankle and joints of right foot: Secondary | ICD-10-CM | POA: Diagnosis not present

## 2018-07-16 DIAGNOSIS — M7989 Other specified soft tissue disorders: Secondary | ICD-10-CM | POA: Diagnosis not present

## 2018-07-16 DIAGNOSIS — S99921A Unspecified injury of right foot, initial encounter: Secondary | ICD-10-CM | POA: Diagnosis not present

## 2018-07-19 ENCOUNTER — Telehealth: Payer: Self-pay | Admitting: Pulmonary Disease

## 2018-07-19 DIAGNOSIS — J449 Chronic obstructive pulmonary disease, unspecified: Secondary | ICD-10-CM

## 2018-07-19 NOTE — Telephone Encounter (Signed)
CBC Latest Ref Rng & Units 07/14/2018 06/14/2018 05/20/2018  WBC 4.0 - 10.5 K/uL 8.1 11.5(H) 7.7  Hemoglobin 12.0 - 15.0 g/dL 15.8(H) 16.1(H) 15.5(H)  Hematocrit 36.0 - 46.0 % 47.3(H) 48.3(H) 46.5(H)  Platelets 150.0 - 400.0 K/uL 230.0 171.0 221.0    BMP Latest Ref Rng & Units 07/14/2018 06/14/2018 05/17/2018  Glucose 70 - 99 mg/dL 106(H) 96 112(H)  BUN 6 - 23 mg/dL 21 13 15   Creatinine 0.40 - 1.20 mg/dL 1.19 0.82 0.85  Sodium 135 - 145 mEq/L 138 137 135  Potassium 3.5 - 5.1 mEq/L 4.0 3.9 4.3  Chloride 96 - 112 mEq/L 101 101 96  CO2 19 - 32 mEq/L 30 26 29   Calcium 8.4 - 10.5 mg/dL 9.9 9.5 9.6    Magnesium 07/14/18 - 2 mg/dl   Please let her know that her electrolytes were normal.    Her hemoglobin is still elevated.  This could be seen with low oxygen levels at night.    Please arrange for ONO with Room air.

## 2018-07-20 ENCOUNTER — Other Ambulatory Visit: Payer: Self-pay

## 2018-07-20 ENCOUNTER — Ambulatory Visit: Payer: Self-pay

## 2018-07-20 ENCOUNTER — Ambulatory Visit (INDEPENDENT_AMBULATORY_CARE_PROVIDER_SITE_OTHER): Payer: BLUE CROSS/BLUE SHIELD | Admitting: Family Medicine

## 2018-07-20 ENCOUNTER — Encounter: Payer: Self-pay | Admitting: Family Medicine

## 2018-07-20 VITALS — BP 144/88 | HR 100 | Ht 65.0 in | Wt 109.0 lb

## 2018-07-20 DIAGNOSIS — M79671 Pain in right foot: Secondary | ICD-10-CM

## 2018-07-20 DIAGNOSIS — S8261XA Displaced fracture of lateral malleolus of right fibula, initial encounter for closed fracture: Secondary | ICD-10-CM

## 2018-07-20 HISTORY — DX: Displaced fracture of lateral malleolus of right fibula, initial encounter for closed fracture: S82.61XA

## 2018-07-20 NOTE — Telephone Encounter (Signed)
Called and spoke with patient regarding results.  Informed the patient of results and recommendations today. Order has been placed for ONO, pt verbalized understanding. Pt verbalized understanding and denied any questions or concerns at this time.  Nothing further needed.

## 2018-07-20 NOTE — Assessment & Plan Note (Signed)
Lateral avulsion, Aircast given, discussed posture and ergonomics.  Discussed once weekly vitamin D.  Topical anti-inflammatories.  And icing regimen.  Follow-up again in 4 to 6 weeks

## 2018-07-20 NOTE — Progress Notes (Signed)
[name] [place], [place] [address] Phone: [phone] Subjective:   I Kandace Blitz am serving as a Education administrator for Dr. Hulan Saas.    CC: Right foot pain  [id number]  [name] is a 63 y.o. female coming in with complaint of right foot pain. States that her foot was cold as ice this morning. Swelling. Soccer ball hit her ankle forcing eversion. Loss of ROM.  Onset- 3/10 Location- peroneals, lateral ankle  Duration-been 1 week since injury Character- achy  Aggravating factors- walking, downstairs, dorsiflex  Reliving factors-  Therapies tried- Ice, topical creams Severity-5 out of 10     Past Medical History:  Diagnosis Date  . Basal cell carcinoma   . History of acute bronchitis 03/24/2011  . Lymphadenopathy, abdominal 03/2013   Noted on abd/pelv CT done to eval slow-to-resolve pyelo  . Meniere disorder 01/23/2012  . Osteoporosis 12/2014   Dr. Nori Riis, her GYN, dx'd her  . Recurrent pyelonephritis   . Seasonal allergic rhinitis   . Squamous cell carcinoma in situ (SCCIS) of skin of lower leg 05/04/2018   Dr. Janan Ridge, [facility]   Past Surgical History:  Procedure Laterality Date  . DEXA  12/2014   T score spine -2.4, hip -3.5 (Dr. Nori Riis, [facility].  [name] DILATION AND CURETTAGE OF UTERUS  2009  . SKIN LESION EXCISION Left 05/24/2018   SCC; Skin, Excision, Left Laeral leg: postsurgical scar, no neoplasm is identified.  Skin, Shave bx and ED&C, right anterior thigh-Superficially invasive squamous cell carcinoma, extending to the deep margin.   Social History   Socioeconomic History  . Marital status: Married    Spouse name: Not on file  . Number of children: Not on file  . Years of education: Not on file  . Highest education level: Not on file  Occupational History  . Occupation: housewife  Social Needs  . Financial resource strain: Not on file  . Food insecurity:    Worry:  Not on file    Inability: Not on file  . Transportation needs:    Medical: Not on file    Non-medical: Not on file  Tobacco Use  . Smoking status: Current Every Day Smoker    Packs/day: 1.00    Years: 29.00    Pack years: 29.00    Types: Cigarettes    Start date: 06/01/1978  . Smokeless tobacco: Never Used  . Tobacco comment: Current 3/4PPD (12/12/16)  Substance and Sexual Activity  . Alcohol use: Yes    Alcohol/week: 7.0 standard drinks    Types: 7 Glasses of wine per week  . Drug use: No  . Sexual activity: Yes    Partners: Male  Lifestyle  . Physical activity:    Days per week: Not on file    Minutes per session: Not on file  . Stress: Not on file  Relationships  . Social connections:    Talks on phone: Not on file    Gets together: Not on file    Attends religious service: Not on file    Active member of club or organization: Not on file    Attends meetings of clubs or organizations: Not on file    Relationship status: Not on file  Other Topics Concern  . Not on file  Social History Narrative  . Not on file   No Known Allergies Family History  Problem Relation Age of Onset  . Hypertension Mother   .  Glaucoma Mother   . Irritable bowel syndrome Mother   . Arthritis Mother   . Kidney disease Mother   . Diabetes Mother   . Hypertension Father   . Heart disease Father        bypasses  . Cancer Father        sarcoma  . Cancer Maternal Grandmother        breast  . Heart disease Maternal Grandmother        CHF  . Stroke Maternal Grandmother        in his 78's  . Stroke Maternal Grandfather   . Other Paternal Grandmother        hardening of the arteries  . Heart attack Paternal Grandfather   . Colon cancer Paternal Aunt 52  . Stomach cancer Neg Hx     Current Outpatient Medications (Endocrine & Metabolic):  .  alendronate (FOSAMAX) 70 MG tablet, Take 1 tablet (70 mg total) by mouth every 7 (seven) days. Take with a full glass of water on an empty stomach.    Current Outpatient Medications (Respiratory):  .  albuterol (PROVENTIL) (2.5 MG/3ML) 0.083% nebulizer solution, Take 3 mLs (2.5 mg total) by nebulization every 6 (six) hours as needed for wheezing or shortness of breath. [name]  azelastine (ASTELIN) 0.1 % nasal spray, Place 1 spray into both nostrils 2 (two) times daily. Use in each nostril as directed .  montelukast (SINGULAIR) 10 MG tablet, Take 1 tablet (10 mg total) by mouth at bedtime. .  SYMBICORT 160-4.5 MCG/ACT inhaler, TAKE 2 PUFFS BY MOUTH TWICE A DAY .  VENTOLIN HFA 108 (90 Base) MCG/ACT inhaler, TAKE 2 PUFFS BY MOUTH EVERY 6 HOURS AS NEEDED FOR WHEEZE OR SHORTNESS OF BREATH    Current Outpatient Medications (Other):  [name]  Multiple Vitamin (MULTIVITAMIN) tablet, Take 1 tablet by mouth daily. [name]  Spacer/Aero-Holding Chambers (AEROCHAMBER MV) inhaler, Use as instructed .  tretinoin (RETIN-A) 0.025 % cream, TAKE 1 APPLICATION THIN LAYER TO FACE NIGHTLY .  VITAMIN D, CHOLECALCIFEROL, PO, Take by mouth. .  fluorouracil (EFUDEX) 5 % cream, APPLY1 APPLICATION ON THE SKIN AT BEDTIME FOR 4 WEEKS    Past medical history, social, surgical and family history all reviewed in electronic medical record.  No pertanent information unless stated regarding to the chief complaint.   Review of Systems:  No headache, visual changes, nausea, vomiting, diarrhea, constipation, dizziness, abdominal pain, skin rash, fevers, chills, night sweats, weight loss, swollen lymph nodes, body aches,  chest pain, shortness of breath, mood changes.  Mild swelling and muscle aches  Objective  Blood pressure (!) 144/88, pulse 100, height 5\' 5"  (1.651 m), weight 109 lb (49.4 kg), SpO2 94 %.    General: No apparent distress alert and oriented x3 mood and affect normal, dressed appropriately.  HEENT: Pupils equal, extraocular movements intact  Respiratory: Patient's speak in full sentences and does not appear short of breath  Cardiovascular: No lower extremity edema, non  tender, no erythema  Skin: Warm dry intact with no signs of infection or rash on extremities or on axial skeleton.  Abdomen: Soft nontender  Neuro: Cranial nerves II through XII are intact, neurovascularly intact in all extremities with 2+ DTRs and 2+ pulses.  Lymph: No lymphadenopathy of posterior or anterior cervical chain or axillae bilaterally.  Gait antalgic MSK:  Non tender with full range of motion and good stability and symmetric strength and tone of shoulders, elbows, wrist, hip, knee bilaterally.  Mild arthritic changes Right ankle  exam shows the patient does have swelling laterally.  Tender to palpation of the lateral malleolus.  No crepitus noted.  Full range of motion of the ankle noted.  Mild pain over the fibular head.  No pain over the medial calcaneal region or the medial malleolus.  MSK US performed of: Right ankle This study was ordered, performed, and interpreted by Charlann Boxer D.O.  Foot/Ankle:   Lateral malleolus shows the patient does have what appears to be a small avulsion fracture, no significant displacement of the bone.  Trace effusion of the joint noted.  IMPRESSION: Lateral avulsion of the ankle.     Impression and Recommendations:     This case required medical decision making of moderate complexity. The above documentation has been reviewed and is accurate and complete Lyndal Pulley, DO       Note: This dictation was prepared with Dragon dictation along with smaller phrase technology. Any transcriptional errors that result from this process are unintentional.

## 2018-07-20 NOTE — Patient Instructions (Signed)
Good to see you.  Ice 20 minutes 2 times daily. Usually after activity and before bed. pennsaid pinkie amount topically 2 times daily as needed.  Wear cast daily for next 1-2 weeks and then working out for another 3 weeks Continue the vitamin D K2 over the counter daily for 1 week Continue the babay aspirin  See me again in 6 weeks if not perfect  Keep hubby in shape

## 2018-07-22 LAB — ALPHA-1 ANTITRYPSIN PHENOTYPE: A-1 Antitrypsin, Ser: 166 mg/dL (ref 83–199)

## 2018-07-23 ENCOUNTER — Telehealth: Payer: Self-pay | Admitting: Pulmonary Disease

## 2018-07-23 NOTE — Telephone Encounter (Signed)
A1AT 07/14/18 >> 166, MM   Please let her know that lab test showed she does not have inherited form of emphysema.

## 2018-07-23 NOTE — Telephone Encounter (Signed)
Called and spoke with Patient.  VS results given.  Understanding stated.  Nothing further at this time.

## 2018-07-23 NOTE — Telephone Encounter (Signed)
Attempted to call patient today regarding results. I did not receive an answer at time of call. I have left a voicemail message for pt to return call. X1  

## 2018-07-30 DIAGNOSIS — J449 Chronic obstructive pulmonary disease, unspecified: Secondary | ICD-10-CM | POA: Diagnosis not present

## 2018-08-10 ENCOUNTER — Telehealth: Payer: Self-pay | Admitting: Pulmonary Disease

## 2018-08-10 DIAGNOSIS — G4736 Sleep related hypoventilation in conditions classified elsewhere: Secondary | ICD-10-CM

## 2018-08-10 DIAGNOSIS — J439 Emphysema, unspecified: Principal | ICD-10-CM | POA: Insufficient documentation

## 2018-08-10 NOTE — Telephone Encounter (Signed)
ONO with RA 07/29/18 >> test time 6 hrs 23 min.  Basal SpO2 86%, low SpO2 72%.  Spent 289.2 min with SpO2 < 88%.   Please let her know her oxygen was low during test while asleep.  She needs to be set up with 2 liters oxygen at night.

## 2018-08-12 NOTE — Telephone Encounter (Signed)
Attempted to call patient today regarding results. I did not receive an answer at time of call. I have left a voicemail message for pt to return call. X1  

## 2018-08-12 NOTE — Telephone Encounter (Signed)
Called the patient and advised her of the results. The patient declined being set up for home oxygen use. Advised patient this means she is not getting enough oxygen during sleep and oxygen was only to be used at night. Patient still declined Dr. Juanetta Gosling recommendation.Said she will wait until her virtual visit in May  Nothing further needed at this time.

## 2018-08-12 NOTE — Telephone Encounter (Signed)
Patient is returning phone call.  Patient phone number is [phone].

## 2018-08-16 ENCOUNTER — Telehealth: Payer: Self-pay | Admitting: Family Medicine

## 2018-08-16 NOTE — Telephone Encounter (Signed)
Patient scheduled for Virtual Visit 08/17/18 with PCP

## 2018-08-16 NOTE — Telephone Encounter (Signed)
Rec'd call from [facility] Triage Nurse. Reported she triaged pt. For c/o blister type rash on face, and bright red color and swelling of hands.  Reported pt. Denied any swelling of lips, tongue, or throat.  Agent was unable to reach office by phone, so call transferred to nurse triage.    Phone call to the office; spoke with Diane at [facility].  Advised to have [facility] Triage notes faxed to the office, and she will direct them to the provider.  Guy Franco from [place] to fax notes to [phone].  Verb. Understanding, and agreed with plan.

## 2018-08-17 ENCOUNTER — Other Ambulatory Visit: Payer: Self-pay

## 2018-08-17 ENCOUNTER — Encounter: Payer: Self-pay | Admitting: Family Medicine

## 2018-08-17 ENCOUNTER — Ambulatory Visit (INDEPENDENT_AMBULATORY_CARE_PROVIDER_SITE_OTHER): Payer: BLUE CROSS/BLUE SHIELD | Admitting: Family Medicine

## 2018-08-17 VITALS — BP 130/95 | Temp 98.8°F | Ht 65.0 in | Wt 106.4 lb

## 2018-08-17 DIAGNOSIS — L237 Allergic contact dermatitis due to plants, except food: Secondary | ICD-10-CM | POA: Diagnosis not present

## 2018-08-17 MED ORDER — TRIAMCINOLONE ACETONIDE 0.1 % EX CREA: 1.0000 "application " | TOPICAL_CREAM | Freq: Two times a day (BID) | CUTANEOUS | 0 refills | Status: DC

## 2018-08-17 MED ORDER — PREDNISONE 20 MG PO TABS: ORAL_TABLET | ORAL | 0 refills | Status: DC

## 2018-08-17 MED ORDER — HYDROXYZINE PAMOATE 25 MG PO CAPS: 25.0000 mg | ORAL_CAPSULE | Freq: Three times a day (TID) | ORAL | 0 refills | Status: DC | PRN

## 2018-08-17 NOTE — Progress Notes (Signed)
Virtual Visit via Video   I connected with [name] on 08/17/18 at  8:30 AM EDT by a video enabled telemedicine application and verified that I am speaking with the correct person using two identifiers. Location patient: Home Location provider: [facility], Office Persons participating in the virtual visit: Patient, Dr. Raoul Pitch and R.Baker, LPN  I discussed the limitations of evaluation and management by telemedicine and the availability of in person appointments. The patient expressed understanding and agreed to proceed.  Subjective:   Chief Complaint  Patient presents with  . Poison Oak    Started on face and now is on her hands x1 week. It is itching and uncomfortable. Pt has been using hydrocortisone and benadryl with no relief.     HPI:  She reports 1 week history of rash after working in the yard.  She had poison sumac last year, but try to stay away from the sumac this year..  Reports it started on her face and now is on her hands.  She has been using hydrocortisone creams and cream and Benadryl without relief.  It is itchy red rash.  Patient feels she has been exposed to poison oak.  She denies any involvement surrounding her mouth, or shortness of breath.  ROS: See pertinent positives and negatives per HPI.  Patient Active Problem List   Diagnosis Date Noted  . Nocturnal hypoxemia due to emphysema ([place]) 08/10/2018  . Avulsion fracture of lateral malleolus of right fibula 07/20/2018  . Bronchitis 05/20/2018  . Squamous cell carcinoma in situ (SCCIS) of skin of lower leg 05/04/2018  . Nonallopathic lesion of lumbosacral region 01/12/2018  . Nonallopathic lesion of sacral region 01/12/2018  . Nonallopathic lesion of thoracic region 01/12/2018  . Nonallopathic lesion of rib cage 01/12/2018  . Nonallopathic lesion of cervical region 01/12/2018  . Osteoporosis without current pathological fracture 10/28/2017  . Pyelonephritis 10/28/2017  . Low back pain  10/15/2017  . Polyarthralgia 10/15/2017  . Acute bursitis of right shoulder 09/16/2017  . Acute lateral meniscal tear 09/09/2017  . Abnormal CT scan of lung 04/23/2016  . Pulmonary nodules/lesions, multiple 04/23/2016  . Mediastinal adenopathy 02/22/2016  . Current every day smoker 02/22/2016  . Encounter for smoking cessation counseling 02/22/2016  . Hematuria 06/07/2015  . Meniere disorder 01/23/2012  . Squamous cell carcinoma   . Nicotine dependence 12/04/2008  . COPD (chronic obstructive pulmonary disease) ([place]) 12/04/2008    Social History   Tobacco Use  . Smoking status: Current Every Day Smoker    Packs/day: 1.00    Years: 29.00    Pack years: 29.00    Types: Cigarettes    Start date: 06/01/1978  . Smokeless tobacco: Never Used  . Tobacco comment: Current 3/4PPD (12/12/16)  Substance Use Topics  . Alcohol use: Yes    Alcohol/week: 7.0 standard drinks    Types: 7 Glasses of wine per week    Current Outpatient Medications:  .  albuterol (PROVENTIL) (2.5 MG/3ML) 0.083% nebulizer solution, Take 3 mLs (2.5 mg total) by nebulization every 6 (six) hours as needed for wheezing or shortness of breath., Disp: 75 mL, Rfl: 1 .  alendronate (FOSAMAX) 70 MG tablet, Take 1 tablet (70 mg total) by mouth every 7 (seven) days. Take with a full glass of water on an empty stomach., Disp: 4 tablet, Rfl: 11 .  azelastine (ASTELIN) 0.1 % nasal spray, Place 1 spray into both nostrils 2 (two) times daily. Use in each nostril as directed, Disp:  30 mL, Rfl: 5 .  fluorouracil (EFUDEX) 5 % cream, APPLY1 APPLICATION ON THE SKIN AT BEDTIME FOR 4 WEEKS, Disp: , Rfl: 3 .  montelukast (SINGULAIR) 10 MG tablet, Take 1 tablet (10 mg total) by mouth at bedtime., Disp: 90 tablet, Rfl: 3 .  Multiple Vitamin (MULTIVITAMIN) tablet, Take 1 tablet by mouth daily., Disp: , Rfl:  .  Spacer/Aero-Holding Chambers (AEROCHAMBER MV) inhaler, Use as instructed, Disp: 1 each, Rfl: 2 .  SYMBICORT 160-4.5 MCG/ACT inhaler,  TAKE 2 PUFFS BY MOUTH TWICE A DAY, Disp: 10.2 Inhaler, Rfl: 6 .  tretinoin (RETIN-A) 0.025 % cream, TAKE 1 APPLICATION THIN LAYER TO FACE NIGHTLY, Disp: , Rfl: 1 .  VENTOLIN HFA 108 (90 Base) MCG/ACT inhaler, TAKE 2 PUFFS BY MOUTH EVERY 6 HOURS AS NEEDED FOR WHEEZE OR SHORTNESS OF BREATH, Disp: 18 Inhaler, Rfl: 3 .  VITAMIN D, CHOLECALCIFEROL, PO, Take by mouth., Disp: , Rfl:   No Known Allergies  Objective:  BP (!) 130/95   Temp 98.8 F (37.1 C) (Oral)   Wt 106 lb 6 oz (48.3 kg)   BMI 17.70 kg/m  Gen: No acute distress. Nontoxic in appearance.  HENT: AT. [place].  MMM.  Eyes: Conjunctiva without redness, discharge or icterus. Chest: Cough and shortness of breath not present. Skin: Red flat rash with very small vesicles intermittently right lower face, forehead, in front of left ear and in webs of fingers bilaterally, no purpura or petechiae.  Neuro:  Alert. Oriented x3  Psych: Normal affect, dress and demeanor. Normal speech. Normal thought content and judgment.  Assessment and Plan:  [name] is a 63 y.o. female present  Poison oak dermatitis - OTC Aveeno, benadryl gels or soaps to help dry and relieve discussed.  -Prednisone, Kenalog cream and Vistaril prescribed with instructions. - predniSONE (DELTASONE) 20 MG tablet; 60 mg x3d, 40 mg x3d, 20 mg x2d, 10 mg x2d  Dispense: 18 tablet; Refill: 0 - triamcinolone cream (KENALOG) 0.1 %; Apply 1 application topically 2 (two) times daily.  Dispense: 30 g; Refill: 0 - hydrOXYzine (VISTARIL) 25 MG capsule; Take 1-2 capsules (25-50 mg total) by mouth every 8 (eight) hours as needed.  Dispense: 60 capsule; Refill: 0 -Follow-up 2 weeks if not improving, sooner if worsening   Howard Pouch, DO 08/17/2018

## 2018-08-17 NOTE — Patient Instructions (Signed)
Prednisone taper prescribed, Kenalog cream prescribed.  Vistaril 1-2 tabs 3 times daily as needed prescribed.  Over-the-counter Benadryl gels, Aveeno and Fels naptha    Poison Ivy Dermatitis  Poison ivy dermatitis is redness and soreness (inflammation) of the skin. It is caused by a chemical that is found on the leaves of the poison ivy plant. You may also have itching, a rash, and blisters. Symptoms often clear up in 1-2 weeks. You may get this condition by touching a poison ivy plant. You can also get it by touching something that has the chemical on it. This may include animals or objects that have come in contact with the plant. Follow these instructions at home: General instructions  Take or apply over-the-counter and prescription medicines only as told by your doctor.  If you touch poison ivy, wash your skin with soap and cold water right away.  Use hydrocortisone creams or calamine lotion as needed to help with itching.  Take oatmeal baths as needed. Use colloidal oatmeal. You can get this at a pharmacy or grocery store. Follow the instructions on the package.  Do not scratch or rub your skin.  While you have the rash, wash your clothes right after you wear them. Prevention   Know what poison ivy looks like so you can avoid it. This plant has three leaves with flowering branches on a single stem. The leaves are glossy. They have uneven edges that come to a point at the front.  If you have touched poison ivy, wash with soap and water right away. Be sure to wash under your fingernails.  When hiking or camping, wear long pants, a long-sleeved shirt, tall socks, and hiking boots. You can also use a lotion on your skin that helps to prevent contact with the chemical on the plant.  If you think that your clothes or outdoor gear came in contact with poison ivy, rinse them off with a garden hose before you bring them inside your house. Contact a doctor if:  You have open sores in  the rash area.  You have more redness, swelling, or pain in the affected area.  You have redness that spreads beyond the rash area.  You have fluid, blood, or pus coming from the affected area.  You have a fever.  You have a rash over a large area of your body.  You have a rash on your eyes, mouth, or genitals.  Your rash does not get better after a few days. Get help right away if:  Your face swells or your eyes swell shut.  You have trouble breathing.  You have trouble swallowing. This information is not intended to replace advice given to you by your health care provider. Make sure you discuss any questions you have with your health care provider. Document Released: 05/24/2010 Document Revised: 01/13/2018 Document Reviewed: 09/27/2014 Elsevier Interactive Patient Education  2019 [employer].

## 2018-08-31 ENCOUNTER — Ambulatory Visit: Payer: BLUE CROSS/BLUE SHIELD | Admitting: Family Medicine

## 2018-09-03 ENCOUNTER — Other Ambulatory Visit: Payer: Self-pay | Admitting: Pulmonary Disease

## 2018-09-09 NOTE — Progress Notes (Signed)
@Patient  ID: [name], female    DOB: Jun 23, 1955, 63 y.o.   MRN: [id number]  Chief Complaint  Patient presents with  . Follow-up    COPD follow-up, increased cough, occasional low-grade fevers    Referring provider: Ma Hillock, DO  HPI:  63 year old female current everyday smoker followed in our office for COPD   PMH:   Smoker/ Smoking History: Current Smoker  Maintenance:  Symbicort 160 Pt of: Dr. Halford Chessman  09/10/2018  - Visit   63 year old female current every day smoker presenting to our office today for a follow-up for COPD.  Patient reports she continues to smoke 1 pack/day.  She is not interested in stopping smoking at this time.  She has tried Chantix, Wellbutrin as well as nicotine replacement therapies in the past.  Patient is maintained on Symbicort 160 as well as uses her rescue inhaler 1-2 times a day.  After last office visit patient completed an overnight oximetry test which showed that she did need overnight oxygen.  Patient refused.  Patient reports that for the last week she is had discolored green mucus for the last week.  She reports her baseline mucus is typically tan or yellow.  She also has had low-grade fevers over the last week last fever being 4 days ago.  She reports her T-max was 99.8  Patient also has followed up with primary care and completed a tele-visit to be treated for poison oak.  Patient has ongoing rash on hands with peeling skin.  Patient reports that symptoms did improve with oral prednisone taper.  As soon as she finished the prednisone taper symptoms returned.  She reports that this is happened 3 times now.  She has not followed up with her dermatologist yet, due to COVID-19 restrictions.   MMRC - Breathlessness Score 1 - I get short of breath when hurrying on level ground or walking up a slight hill    Tests:   Pulmonary tests:  PFT 12/15/08 FEV1 2.20(90%), FVC 3.68(113%), FEV1% 60, TLC 6.54(132%), DLCO 75%, no BD response Sputum  culture 06/27/16 >> Moraxella catarrhalis PFT 08/25/16 >> FEV1 1.55 (59%), FEV1% 57, TLC 5.33 (103%), DLCO 52% ONO with RA 07/29/18 >> test time 6 hrs 23 min.  Basal SpO2 86%, low SpO2 72%.  Spent 289.2 min with SpO2 < 88%.  Cardiac tests:  CT chest 04/21/16 >> borderline LAN, nodules in lung bases with tree in bud CT chest 10/22/16 >> moderate centrilobular emphysema, 1.2 cm nodule LUL, tree in bud, RLL BTX CT chest 01/26/17 >> moderate centrilobular emphysema, mild b/l BTX with tree in bud, LUL nodule resolved, stable 7 mm RLL nodule since 2015  FENO:  No results found for: NITRICOXIDE  PFT: PFT Results Latest Ref Rng & Units 08/25/2016  FVC-Pre L 2.63  FVC-Predicted Pre % 77  FVC-Post L 2.72  FVC-Predicted Post % 80  Pre FEV1/FVC % % 55  Post FEV1/FCV % % 57  FEV1-Pre L 1.45  FEV1-Predicted Pre % 55  FEV1-Post L 1.55  DLCO UNC% % 52  DLCO COR %Predicted % 61  TLC L 5.33  TLC % Predicted % 103  RV % Predicted % 125    Imaging: Dg Chest 2 View  Result Date: 09/10/2018 CLINICAL DATA:  Bronchiectasis with acute exacerbation EXAM: CHEST - 2 VIEW COMPARISON:  06/14/2018 FINDINGS: Normal heart size and mediastinal contours. Large lung volumes with diaphragm flattening. There is history of bronchiectasis which is not well visualized. There is  no edema, consolidation, effusion, or pneumothorax. IMPRESSION: 1. No evidence of active disease. 2. Hyperinflation. Electronically Signed   By: Monte Fantasia M.D.   On: 09/10/2018 11:17      Specialty Problems      Pulmonary Problems   COPD (chronic obstructive pulmonary disease) (HCC)    PFT 08/25/16 >> FEV1 1.55 (59%), FEV1% 57, TLC 5.33 (103%), DLCO 52%  CT chest 01/26/17 >> moderate centrilobular emphysema, mild b/l BTX with tree in bud, LUL nodule resolved, stable 7 mm RLL nodule since 2015       Pulmonary nodules/lesions, multiple   Bronchitis   Nocturnal hypoxemia due to emphysema ([place])    ONO with RA 07/29/18 >> test time 6 hrs 23  min.  Basal SpO2 86%, low SpO2 72%.  Spent 289.2 min with SpO2 < 88%.      Bronchiectasis with acute exacerbation ([place])    CT chest 01/26/17 >> moderate centrilobular emphysema, mild b/l BTX with tree in bud, LUL nodule resolved, stable 7 mm RLL nodule since 2015         No Known Allergies  Immunization History  Administered Date(s) Administered  . Influenza Split 04/09/2011  . Influenza Whole 02/02/2009  . Influenza,inj,Quad PF,6+ Mos 01/26/2014, 02/06/2017, 03/04/2018  . Influenza-Unspecified 02/02/2013, 02/24/2015, 04/03/2016  . Zoster Recombinat (Shingrix) 03/04/2018    Past Medical History:  Diagnosis Date  . Basal cell carcinoma   . History of acute bronchitis 03/24/2011  . Lymphadenopathy, abdominal 03/2013   Noted on abd/pelv CT done to eval slow-to-resolve pyelo  . Meniere disorder 01/23/2012  . Osteoporosis 12/2014   Dr. Nori Riis, her GYN, dx'd her  . Recurrent pyelonephritis   . Seasonal allergic rhinitis   . Squamous cell carcinoma in situ (SCCIS) of skin of lower leg 05/04/2018   Dr. Janan Ridge, [facility]    Tobacco History: Social History   Tobacco Use  Smoking Status Current Every Day Smoker  . Packs/day: 1.00  . Years: 40.00  . Pack years: 40.00  . Types: Cigarettes  . Start date: 06/01/1978  Smokeless Tobacco Never Used  Tobacco Comment   Current 3/4PPD (12/12/16)   Ready to quit: Not Answered Counseling given: Yes Comment: Current 3/4PPD (12/12/16)  Smoking assessment and cessation counseling  Patient currently smoking: 1 ppd  I have advised the patient to quit/stop smoking as soon as possible due to high risk for multiple medical problems.  It will also be very difficult for [place] to manage patient's  respiratory symptoms and status if we continue to expose her lungs to a known irritant.  We do not advise e-cigarettes as a form of stopping smoking.  Patient is not willing to quit smoking. Life is too hectic at this time.   I have  advised the patient that we can assist and have options of nicotine replacement therapy, provided smoking cessation education today, provided smoking cessation counseling, and provided cessation resources.  Patient reports that she is used Chantix in the past as well as Wellbutrin.  This did not help with stopping smoking .  Patient has also used nicotine replacement therapies in the past.  She is not interested in smoking sensation at this time.  Follow-up next office visit office visit for assessment of smoking cessation.    Smoking cessation counseling advised for: 7 min     Outpatient Encounter Medications as of 09/10/2018  Medication Sig  . albuterol (PROVENTIL) (2.5 MG/3ML) 0.083% nebulizer solution Take 3 mLs (2.5 mg total) by  nebulization every 6 (six) hours as needed for wheezing or shortness of breath.  [name] alendronate (FOSAMAX) 70 MG tablet Take 1 tablet (70 mg total) by mouth every 7 (seven) days. Take with a full glass of water on an empty stomach.  [name] azelastine (ASTELIN) 0.1 % nasal spray Place 1 spray into both nostrils 2 (two) times daily. Use in each nostril as directed  . hydrOXYzine (VISTARIL) 25 MG capsule Take 1-2 capsules (25-50 mg total) by mouth every 8 (eight) hours as needed.  . montelukast (SINGULAIR) 10 MG tablet Take 1 tablet (10 mg total) by mouth at bedtime.  . Multiple Vitamin (MULTIVITAMIN) tablet Take 1 tablet by mouth daily.  [name] Spacer/Aero-Holding Chambers (AEROCHAMBER MV) inhaler Use as instructed  . SYMBICORT 160-4.5 MCG/ACT inhaler TAKE 2 PUFFS BY MOUTH TWICE A DAY  . triamcinolone cream (KENALOG) 0.1 % Apply 1 application topically 2 (two) times daily.  . VENTOLIN HFA 108 (90 Base) MCG/ACT inhaler TAKE 2 PUFFS BY MOUTH EVERY 6 HOURS AS NEEDED FOR WHEEZE OR SHORTNESS OF BREATH  . VITAMIN D, CHOLECALCIFEROL, PO Take by mouth.  . fluorouracil (EFUDEX) 5 % cream APPLY1 APPLICATION ON THE SKIN AT BEDTIME FOR 4 WEEKS  . levofloxacin (LEVAQUIN) 500 MG tablet Take  1 tablet (500 mg total) by mouth daily.  [name] Respiratory Therapy Supplies (FLUTTER) DEVI Twice a day and prn as needed, may increase if feeling worse  . tretinoin (RETIN-A) 0.025 % cream TAKE 1 APPLICATION THIN LAYER TO FACE NIGHTLY  . [DISCONTINUED] predniSONE (DELTASONE) 20 MG tablet 60 mg x3d, 40 mg x3d, 20 mg x2d, 10 mg x2d (Patient not taking: Reported on 09/10/2018)   No facility-administered encounter medications on file as of 09/10/2018.      Review of Systems  Review of Systems  Constitutional: Positive for fatigue. Negative for chills, fever and unexpected weight change.  HENT: Positive for congestion. Negative for ear pain, postnasal drip, sinus pressure and sinus pain.   Respiratory: Positive for cough and shortness of breath. Negative for chest tightness and wheezing.   Cardiovascular: Negative for chest pain, palpitations and leg swelling.  Gastrointestinal: Negative for diarrhea, nausea and vomiting.  Genitourinary: Negative for dysuria, frequency and urgency.  Musculoskeletal: Negative for arthralgias.  Skin: Negative for color change.  Allergic/Immunologic: Negative for environmental allergies and food allergies.  Neurological: Negative for dizziness, light-headedness and headaches.  Psychiatric/Behavioral: Negative for dysphoric mood. The patient is not nervous/anxious.   All other systems reviewed and are negative.    Physical Exam  BP 134/82 (BP Location: Left Arm, Cuff Size: Normal)   Pulse 80   Temp 98.8 F (37.1 C) (Oral)   Ht 5' 5"  (1.651 m)   Wt 107 lb 12.8 oz (48.9 kg)   SpO2 95%   BMI 17.94 kg/m   Wt Readings from Last 5 Encounters:  09/10/18 107 lb 12.8 oz (48.9 kg)  08/17/18 106 lb 6 oz (48.3 kg)  07/20/18 109 lb (49.4 kg)  07/14/18 110 lb (49.9 kg)  06/16/18 109 lb (49.4 kg)     Physical Exam  Constitutional: She is oriented to person, place, and time and well-developed, well-nourished, and in no distress. No distress.  Thin adult female   HENT:  Head: Normocephalic and atraumatic.  Right Ear: Hearing, tympanic membrane, external ear and ear canal normal.  Left Ear: Hearing, tympanic membrane, external ear and ear canal normal.  Mouth/Throat: Uvula is midline and oropharynx is clear and moist. Abnormal dentition. No oropharyngeal exudate.  Eyes:  Pupils are equal, round, and reactive to light.  Neck: Normal range of motion. Neck supple.  Cardiovascular: Normal rate, regular rhythm and normal heart sounds.  Pulmonary/Chest: Effort normal and breath sounds normal. No accessory muscle usage. No respiratory distress. She has no decreased breath sounds. She has no wheezes. She has no rhonchi. She has no rales.  Musculoskeletal: Normal range of motion.        General: No edema.  Lymphadenopathy:    She has no cervical adenopathy.  Neurological: She is alert and oriented to person, place, and time. Gait normal.  Skin: Skin is warm and dry. She is not diaphoretic. There is erythema.     Psychiatric: Mood, memory, affect and judgment normal.  Nursing note and vitals reviewed.     Lab Results:  CBC    Component Value Date/Time   WBC 8.1 07/14/2018 1028   RBC 4.86 07/14/2018 1028   HGB 15.8 (H) 07/14/2018 1028   HGB 14.6 06/01/2013 1107   HCT 47.3 (H) 07/14/2018 1028   HCT 44.9 06/01/2013 1107   PLT 230.0 07/14/2018 1028   PLT 199 06/01/2013 1107   MCV 97.3 07/14/2018 1028   MCV 93 06/01/2013 1107   MCH 30.4 06/01/2013 1107   MCH 31.5 03/18/2013 1212   MCHC 33.5 07/14/2018 1028   RDW 13.8 07/14/2018 1028   RDW 13.6 06/01/2013 1107   LYMPHSABS 1.3 06/14/2018 1148   LYMPHSABS 2.5 06/01/2013 1107   MONOABS 0.9 06/14/2018 1148   EOSABS 0.0 06/14/2018 1148   EOSABS 0.0 06/01/2013 1107   BASOSABS 0.1 06/14/2018 1148   BASOSABS 0.0 06/01/2013 1107    BMET    Component Value Date/Time   NA 138 07/14/2018 1028   K 4.0 07/14/2018 1028   CL 101 07/14/2018 1028   CO2 30 07/14/2018 1028   GLUCOSE 106 (H) 07/14/2018  1028   BUN 21 07/14/2018 1028   CREATININE 1.19 07/14/2018 1028   CREATININE 0.72 03/18/2013 1212   CALCIUM 9.9 07/14/2018 1028   GFRNONAA >60 11/15/2008 1144   GFRAA  11/15/2008 1144    >60        The eGFR has been calculated using the MDRD equation. This calculation has not been validated in all clinical situations. eGFR's persistently <60 mL/min signify possible Chronic Kidney Disease.    BNP No results found for: BNP  ProBNP No results found for: PROBNP    Assessment & Plan:     Nocturnal hypoxemia due to emphysema [facility]) Assessment: Patient is qualified for nighttime oxygen use Patient declined oxygen use Patient has concerns about starting oxygen as well as what her family would think  Plan: Patient and I reviewed her overnight oximetry results at length today I have recommended that she start nighttime oxygen use Patient declined today Patient knows to contact our office at any point in time if she would like to start oxygen therapy as is recommended Follow-up with our office in 4 to 6 weeks  COPD (chronic obstructive pulmonary disease) ([place]) Assessment: Patient followed in our office for COPD CT chest in 2018 shows moderate centrilobular emphysema Current every day smoker Smoking 1 pack/day Maintained on Symbicort 160 MMRC 1 Lung sounds clear on exam today  Plan: Continue Symbicort 160 Continue rescue inhaler as needed We recommend that you stop smoking Follow-up with our office in 4 to 6 weeks  Bronchiectasis with acute exacerbation ([place]) Assessment: Patient reporting that she has a baseline productive cough with yellow to tan mucus Patient has had  a productive cough with green mucus for the last week Patient has had occasional low-grade fevers Patient has had increased fatigue Patient has not maintained on a flutter valve Patient continues to take Mucinex Patient with known bronchiectasis on 2018 CT chest 2018 sputum culture grew out M  catarrhalis  Plan: Sputum culture today Chest x-ray today Levaquin today after sputum cultures produced Flutter valve to be started today Follow-up with our office in 4 to 6 weeks   Current every day smoker Assessment: Current everyday smoker Smoking 1 pack/day Patient reports that she is tried Chantix, Wellbutrin as well as nicotine replacement therapies in the past Patient is not willing to stop smoking at this time  Plan: Counseled patient today and recommended that she stop smoking Patient knows to contact her office as soon as she is ready to stop smoking  Polyarthralgia Assessment: Patient reporting that Dr. Tamala Julian had told her she may have rheumatoid arthritis in her hands Chart review not showing any rheumatoid or CCP lab levels  Plan: Patient to follow-up with primary care regarding additional lab work  Rash of hands Assessment: Patient reports that she has had 3 recurrent rashes on hands Steroid responsive to prednisone taper Patient using steroid cream Patient has not followed up with dermatology Peeling erythematous rash on hands bilaterally today  Plan: Patient to follow-up with primary care I would recommend the patient follow-up with dermatology as well Patient may need additional autoimmune work-up     Lauraine Rinne, NP 09/10/2018   This appointment was 42 min long with over 50% of the time in direct face-to-face patient care, assessment, plan of care, and follow-up.

## 2018-09-10 ENCOUNTER — Encounter: Payer: Self-pay | Admitting: Pulmonary Disease

## 2018-09-10 ENCOUNTER — Ambulatory Visit (INDEPENDENT_AMBULATORY_CARE_PROVIDER_SITE_OTHER): Payer: BLUE CROSS/BLUE SHIELD

## 2018-09-10 ENCOUNTER — Other Ambulatory Visit: Payer: Self-pay

## 2018-09-10 ENCOUNTER — Ambulatory Visit (INDEPENDENT_AMBULATORY_CARE_PROVIDER_SITE_OTHER): Payer: BLUE CROSS/BLUE SHIELD | Admitting: Pulmonary Disease

## 2018-09-10 ENCOUNTER — Ambulatory Visit: Payer: BLUE CROSS/BLUE SHIELD | Admitting: Pulmonary Disease

## 2018-09-10 VITALS — BP 134/82 | HR 80 | Temp 98.8°F | Ht 65.0 in | Wt 107.8 lb

## 2018-09-10 DIAGNOSIS — J471 Bronchiectasis with (acute) exacerbation: Secondary | ICD-10-CM

## 2018-09-10 DIAGNOSIS — J411 Mucopurulent chronic bronchitis: Secondary | ICD-10-CM | POA: Diagnosis not present

## 2018-09-10 DIAGNOSIS — J439 Emphysema, unspecified: Secondary | ICD-10-CM | POA: Diagnosis not present

## 2018-09-10 DIAGNOSIS — F17200 Nicotine dependence, unspecified, uncomplicated: Secondary | ICD-10-CM

## 2018-09-10 DIAGNOSIS — J181 Lobar pneumonia, unspecified organism: Secondary | ICD-10-CM | POA: Diagnosis not present

## 2018-09-10 DIAGNOSIS — F17210 Nicotine dependence, cigarettes, uncomplicated: Secondary | ICD-10-CM | POA: Diagnosis not present

## 2018-09-10 DIAGNOSIS — M2550 Pain in unspecified joint: Secondary | ICD-10-CM

## 2018-09-10 DIAGNOSIS — J441 Chronic obstructive pulmonary disease with (acute) exacerbation: Secondary | ICD-10-CM | POA: Diagnosis not present

## 2018-09-10 DIAGNOSIS — R21 Rash and other nonspecific skin eruption: Secondary | ICD-10-CM

## 2018-09-10 DIAGNOSIS — G4736 Sleep related hypoventilation in conditions classified elsewhere: Secondary | ICD-10-CM

## 2018-09-10 MED ORDER — FLUTTER DEVI: 0 refills | Status: DC

## 2018-09-10 MED ORDER — LEVOFLOXACIN 500 MG PO TABS: 500.0000 mg | ORAL_TABLET | Freq: Every day | ORAL | 0 refills | Status: DC

## 2018-09-10 NOTE — Patient Instructions (Addendum)
Chest Xray today   Sputum culture today  >>> three ways   Levaquin 500mg  tablet >>> Take 1 500 mg tablet daily for the next 7 days >>> Take with food >>> start probiotic for good gut health >>>avoid rigorous exercise for next 2 weeks   Start taking antibiotic once you have produced a sputum culture  Continue Symbicort 160 >>> 2 puffs in the morning right when you wake up, rinse out your mouth after use, 12 hours later 2 puffs, rinse after use >>> Take this daily, no matter what >>> This is not a rescue inhaler   Only use your albuterol as a rescue medication to be used if you can't catch your breath by resting or doing a relaxed purse lip breathing pattern.  - The less you use it, the better it will work when you need it. - Ok to use up to 2 puffs  every 4 hours if you must but call for immediate appointment if use goes up over your usual need - Don't leave home without it !!  (think of it like the spare tire for your car)    Note your daily symptoms > remember "red flags" for COPD:   >>>Increase in cough >>>increase in sputum production >>>increase in shortness of breath or activity  intolerance.   If you notice these symptoms, please call the office to be seen.    Bronchiectasis: This is the medical term which indicates that you have damage, dilated airways making you more susceptible to respiratory infection. Use a flutter valve 10 breaths twice a day or 4 to 5 breaths 4-5 times a day to help clear mucus out Let us know if you have cough with change in mucus color or fevers or chills.  At that point you would need an antibiotic. Maintain a healthy nutritious diet, eating whole foods Take your medications as prescribed      We recommend that you stop smoking.  >>>You need to set a quit date >>>If you have friends or family who smoke, let them know you are trying to quit and not to smoke around you or in your living environment  Smoking Cessation Resources:  1 800 QUIT  NOW  >>> Patient to call this resource and utilize it to help support her quit smoking >>> Keep up your hard work with stopping smoking  You can also contact the [facility] >>>For smoking cessation classes call [phone]  We do not recommend using e-cigarettes as a form of stopping smoking  You can sign up for smoking cessation support texts and information:  >>>https://smokefree.gov/smokefreetxt   If you change your mind on overnight oxygen please contact our office and we can place the order   Please contact your primary care and schedule an office visit so they can further evaluate the rash on your hands.   Return in about 4 weeks (around 10/08/2018), or if symptoms worsen or fail to improve, for Follow up with Dr. Halford Chessman.         Coronavirus (COVID-19) Are you at risk?  Are you at risk for the Coronavirus (COVID-19)?  To be considered HIGH RISK for Coronavirus (COVID-19), you have to meet the following criteria:  . Traveled to [place], [place], [place], [place] or [place]; or in the [place] to [place], [place], [place], or [place]; and have fever, cough, and shortness of breath within the last 2 weeks of travel OR . Been in close contact with a person  diagnosed with COVID-19 within the last 2 weeks and have fever, cough, and shortness of breath . IF YOU DO NOT MEET THESE CRITERIA, YOU ARE CONSIDERED LOW RISK FOR COVID-19.  What to do if you are HIGH RISK for COVID-19?  [name] If you are having a medical emergency, call 911. . Seek medical care right away. Before you go to a doctor's office, urgent care or emergency department, call ahead and tell them about your recent travel, contact with someone diagnosed with COVID-19, and your symptoms. You should receive instructions from your physician's office regarding next steps of care.  . When you arrive at healthcare provider, tell the healthcare staff immediately you have returned from visiting [place],  [place], [place], [place] or [place]; or traveled in the [place] to [place], [place], [place], or [place]; in the last two weeks or you have been in close contact with a person diagnosed with COVID-19 in the last 2 weeks.   . Tell the health care staff about your symptoms: fever, cough and shortness of breath. . After you have been seen by a medical provider, you will be either: o Tested for (COVID-19) and discharged home on quarantine except to seek medical care if symptoms worsen, and asked to  - Stay home and avoid contact with others until you get your results (4-5 days)  - Avoid travel on public transportation if possible (such as bus, train, or airplane) or o Sent to the Emergency Department by EMS for evaluation, COVID-19 testing, and possible admission depending on your condition and test results.  What to do if you are LOW RISK for COVID-19?  Reduce your risk of any infection by using the same precautions used for avoiding the common cold or flu:  [name] Wash your hands often with soap and warm water for at least 20 seconds.  If soap and water are not readily available, use an alcohol-based hand sanitizer with at least 60% alcohol.  . If coughing or sneezing, cover your mouth and nose by coughing or sneezing into the elbow areas of your shirt or coat, into a tissue or into your sleeve (not your hands). . Avoid shaking hands with others and consider head nods or verbal greetings only. . Avoid touching your eyes, nose, or mouth with unwashed hands.  . Avoid close contact with people who are sick. . Avoid places or events with large numbers of people in one location, like concerts or sporting events. . Carefully consider travel plans you have or are making. . If you are planning any travel outside or inside the [place], visit the CDC's Travelers' Health webpage for the latest health notices. . If you have some symptoms but not all symptoms, continue to monitor at home and seek medical  attention if your symptoms worsen. . If you are having a medical emergency, call 911.   [place] / e-Visit: [email or web address]         [facility]: [facility]: [phone]                   [facility]: [phone]           It is flu season:   >>> Best ways to protect herself from the flu: Receive the yearly flu vaccine, practice good hand hygiene washing with soap and also using hand sanitizer when available, eat a nutritious meals, get adequate rest, hydrate appropriately  Please contact the office if your symptoms worsen or you have concerns that you are not improving.   Thank you for choosing [facility] for your healthcare, and for allowing [place] to partner with you on your healthcare journey. I am thankful to be able to provide care to you today.   Wyn Quaker FNP-C      Bronchiectasis  Bronchiectasis is a condition in which the airways in the lungs (bronchi) are damaged and widened. The condition makes it hard for the lungs to get rid of mucus, and it causes mucus to gather in the bronchi. This condition often leads to lung infections, which can make the condition worse. What are the causes? You can be born with this condition or you can develop it later in life. Common causes of this condition include:  Cystic fibrosis.  Repeated lung infections, such as pneumonia or tuberculosis.  An object or other blockage in the lungs.  Breathing in fluid, food, or other objects (aspiration).  A problem with the immune system and lung structure that is present at birth (congenital). Sometimes the cause is not known. What are the signs or symptoms? Common symptoms of this condition include:  A daily cough that brings up mucus and lasts for more than 3 weeks.  Lung infections that happen  often.  Shortness of breath and wheezing.  Weakness and fatigue. How is this diagnosed? This condition is diagnosed with tests, such as:  Chest X-rays or CT scans. These are done to check for changes in the lungs.  Breathing tests. These are done to check how well your lungs are working.  A test of a sample of your saliva (sputum culture). This test is done to check for infection.  Blood tests and other tests. These are done to check for related diseases or causes. How is this treated? Treatment for this condition depends on the severity of the illness and its cause. Treatment may include:  Medicines that loosen mucus so it can be coughed up (expectorants).  Medicines that relax the muscles of the bronchi (bronchodilators).  Antibiotic medicines to prevent or treat infection.  Physical therapy to help clear mucus from the lungs. Techniques may include: ? Postural drainage. This is when you sit or lie in certain positions so that mucus can drain by gravity. ? Chest percussion. This involves tapping the chest or back with a cupped hand. ? Chest vibration. For this therapy, a hand or special equipment vibrates your chest and back.  Surgery to remove the affected part of the lung. This may be done in severe cases. Follow these instructions at home: Medicines  Take over-the-counter and prescription medicines only as told by your health care provider.  If you were prescribed an antibiotic medicine, take it as told by your health care provider. Do not stop taking the antibiotic even if you start to feel better.  Avoid taking sedatives and antihistamines unless your health care provider tells you to take them. These medicines tend to thicken the mucus in the lungs. Managing symptoms  Perform breathing exercises or techniques to clear your lungs as told by your health care provider.  Consider using a cold steam vaporizer or humidifier in your room or home to help loosen  secretions.  If you have a cough that gets worse at night, try sleeping in a semi-upright position. General instructions  Get plenty of rest.  Drink enough fluid to keep your urine clear or pale yellow.  Stay inside when pollution  and ozone levels are high.  Stay up to date with vaccinations and immunizations.  Avoid cigarette smoke and other lung irritants.  Do not use any products that contain nicotine or tobacco, such as cigarettes and e-cigarettes. If you need help quitting, ask your health care provider.  Keep all follow-up visits as told by your health care provider. This is important. Contact a health care provider if:  You cough up more sputum than before and the sputum is yellow or green in color.  You have a fever.  You cannot control your cough and are losing sleep. Get help right away if:  You cough up blood.  You have chest pain.  You have increasing shortness of breath.  You have pain that gets worse or is not controlled with medicines.  You have a fever and your symptoms suddenly get worse. Summary  Bronchiectasis is a condition in which the airways in the lungs (bronchi) are damaged and widened. The condition makes it hard for the lungs to get rid of mucus, and it causes mucus to gather in the bronchi.  Treatment usually includes therapy to help clear mucus from the lungs.  Stay up to date with vaccinations and immunizations. This information is not intended to replace advice given to you by your health care provider. Make sure you discuss any questions you have with your health care provider. Document Released: 02/16/2007 Document Revised: 05/26/2016 Document Reviewed: 05/26/2016 Elsevier Interactive Patient Education  2019 [employer] Risks of Smoking Smoking cigarettes is very bad for your health. Tobacco smoke has over 200 known poisons in it. It contains the poisonous gases nitrogen oxide and carbon monoxide. There are over 60  chemicals in tobacco smoke that cause cancer. Smoking is difficult to quit because a chemical in tobacco, called nicotine, causes addiction or dependence. When you smoke and inhale, nicotine is absorbed rapidly into the bloodstream through your lungs. Both inhaled and non-inhaled nicotine may be addictive. What are the risks of cigarette smoke? Cigarette smokers have an increased risk of many serious medical problems, including:  Lung cancer.  Lung disease, such as pneumonia, bronchitis, and emphysema.  Chest pain (angina) and heart attack because the heart is not getting enough oxygen.  Heart disease and peripheral blood vessel disease.  High blood pressure (hypertension).  Stroke.  Oral cancer, including cancer of the lip, mouth, or voice box.  Bladder cancer.  Pancreatic cancer.  Cervical cancer.  Pregnancy complications, including premature birth.  Stillbirths and smaller newborn babies, birth defects, and genetic damage to sperm.  Early menopause.  Lower estrogen level for women.  Infertility.  Facial wrinkles.  Blindness.  Increased risk of broken bones (fractures).  Senile dementia.  Stomach ulcers and internal bleeding.  Delayed wound healing and increased risk of complications during surgery.  Even smoking lightly shortens your life expectancy by several years. Because of secondhand smoke exposure, children of smokers have an increased risk of the following:  Sudden infant death syndrome (SIDS).  Respiratory infections.  Lung cancer.  Heart disease.  Ear infections. What are the benefits of quitting? There are many health benefits of quitting smoking. Here are some of them:  Within days of quitting smoking, your risk of having a heart attack decreases, your blood flow improves, and your lung capacity improves. Blood pressure, pulse rate, and breathing patterns start returning to normal soon after quitting.  Within months, your lungs may clear  up completely.  Quitting for 10 years reduces  your risk of developing lung cancer and heart disease to almost that of a nonsmoker.  People who quit may see an improvement in their overall quality of life. How do I quit smoking?     Smoking is an addiction with both physical and psychological effects, and longtime habits can be hard to change. Your health care provider can recommend:  Programs and community resources, which may include group support, education, or talk therapy.  Prescription medicines to help reduce cravings.  Nicotine replacement products, such as patches, gum, and nasal sprays. Use these products only as directed. Do not replace cigarette smoking with electronic cigarettes, which are commonly called e-cigarettes. The safety of e-cigarettes is not known, and some may contain harmful chemicals.  A combination of two or more of these methods. Where to find more information  American Lung Association: www.lung.org  American Cancer Society: www.cancer.org Summary  Smoking cigarettes is very bad for your health. Cigarette smokers have an increased risk of many serious medical problems, including several cancers, heart disease, and stroke.  Smoking is an addiction with both physical and psychological effects, and longtime habits can be hard to change.  By stopping right away, you can greatly reduce the risk of medical problems for you and your family.  To help you quit smoking, your health care provider can recommend programs, community resources, prescription medicines, and nicotine replacement products such as patches, gum, and nasal sprays. This information is not intended to replace advice given to you by your health care provider. Make sure you discuss any questions you have with your health care provider. Document Released: 05/29/2004 Document Revised: 07/23/2017 Document Reviewed: 04/25/2016 Elsevier Interactive Patient Education  2019 [employer].

## 2018-09-10 NOTE — Assessment & Plan Note (Addendum)
Assessment: Patient followed in our office for COPD CT chest in 2018 shows moderate centrilobular emphysema Current every day smoker Smoking 1 pack/day Maintained on Symbicort 160 MMRC 1 Lung sounds clear on exam today  Plan: Continue Symbicort 160 Continue rescue inhaler as needed We recommend that you stop smoking Follow-up with our office in 4 to 6 weeks

## 2018-09-10 NOTE — Assessment & Plan Note (Signed)
Assessment: Patient reporting that Dr. Tamala Julian had told her she may have rheumatoid arthritis in her hands Chart review not showing any rheumatoid or CCP lab levels  Plan: Patient to follow-up with primary care regarding additional lab work

## 2018-09-10 NOTE — Assessment & Plan Note (Signed)
Assessment: Patient reporting that she has a baseline productive cough with yellow to tan mucus Patient has had a productive cough with green mucus for the last week Patient has had occasional low-grade fevers Patient has had increased fatigue Patient has not maintained on a flutter valve Patient continues to take Mucinex Patient with known bronchiectasis on 2018 CT chest 2018 sputum culture grew out M catarrhalis  Plan: Sputum culture today Chest x-ray today Levaquin today after sputum cultures produced Flutter valve to be started today Follow-up with our office in 4 to 6 weeks

## 2018-09-10 NOTE — Assessment & Plan Note (Signed)
Assessment: Patient is qualified for nighttime oxygen use Patient declined oxygen use Patient has concerns about starting oxygen as well as what her family would think  Plan: Patient and I reviewed her overnight oximetry results at length today I have recommended that she start nighttime oxygen use Patient declined today Patient knows to contact our office at any point in time if she would like to start oxygen therapy as is recommended Follow-up with our office in 4 to 6 weeks

## 2018-09-10 NOTE — Progress Notes (Signed)
Your chest x-ray results of come back.  Showing no acute changes.  No plan of care changes at this time.  Keep follow-up appointment.    Follow-up with our office if symptoms worsen or you do not feel like you are improving under her current regimen.  It was a pleasure taking care of you,  Brian Mack, FNP 

## 2018-09-10 NOTE — Assessment & Plan Note (Signed)
Assessment: Patient reports that she has had 3 recurrent rashes on hands Steroid responsive to prednisone taper Patient using steroid cream Patient has not followed up with dermatology Peeling erythematous rash on hands bilaterally today  Plan: Patient to follow-up with primary care I would recommend the patient follow-up with dermatology as well Patient may need additional autoimmune work-up

## 2018-09-10 NOTE — Assessment & Plan Note (Signed)
Assessment: Current everyday smoker Smoking 1 pack/day Patient reports that she is tried Chantix, Wellbutrin as well as nicotine replacement therapies in the past Patient is not willing to stop smoking at this time  Plan: Counseled patient today and recommended that she stop smoking Patient knows to contact her office as soon as she is ready to stop smoking

## 2018-09-13 ENCOUNTER — Ambulatory Visit (INDEPENDENT_AMBULATORY_CARE_PROVIDER_SITE_OTHER): Payer: BLUE CROSS/BLUE SHIELD | Admitting: Family Medicine

## 2018-09-13 ENCOUNTER — Other Ambulatory Visit: Payer: Self-pay

## 2018-09-13 ENCOUNTER — Encounter: Payer: Self-pay | Admitting: Family Medicine

## 2018-09-13 VITALS — BP 136/85 | HR 120 | Temp 98.7°F | Resp 17 | Ht 65.0 in | Wt 109.5 lb

## 2018-09-13 DIAGNOSIS — L239 Allergic contact dermatitis, unspecified cause: Secondary | ICD-10-CM | POA: Diagnosis not present

## 2018-09-13 MED ORDER — PREDNISONE 10 MG PO TABS: ORAL_TABLET | ORAL | 0 refills | Status: DC

## 2018-09-13 NOTE — Progress Notes (Signed)
OFFICE VISIT  09/13/2018   CC:  Chief Complaint  Patient presents with  . Rash    Pt has had skin rash that went away after Dr Raoul Pitch tx. Woke up this morning with it all over arms. Itches and feels like her skin is on fire.    HPI:    Patient is a 63 y.o. Caucasian female who presents for rash. She was seen 08/17/18 by Dr. Raoul Pitch for 1 wk hx of itchy rash on hands and face after working in yard.  She was put on 10d prednisone taper, kenalog, and hydroxyzine prn for dx of allergic contact derm (poison sumac or other).  Interim hx: Two days after finishing the prednisone pt states the rash returned, mostly on arms.  Hands have peeled. A few days ago the rash worsened on both arms, shoulders, upper back, back of neck, upper anterior chest.  No face involvment this time.  No fevers, myalgias, arthralgias, HA's, or fatigue. She is being treated with levaquin by pulm for copd exacerbation and says she has been feeling better regarding resp sx's (has chronic cough/tob abuse). No oral ulcerations, no painful rashes The rash is scattered erythematous splotches with papulopustular component dispersed through it. VERY ITCHY/Burning sensation.   No eye complaints.  Past Medical History:  Diagnosis Date  . Basal cell carcinoma   . History of acute bronchitis 03/24/2011  . Lymphadenopathy, abdominal 03/2013   Noted on abd/pelv CT done to eval slow-to-resolve pyelo  . Meniere disorder 01/23/2012  . Osteoporosis 12/2014   Dr. Nori Riis, her GYN, dx'd her  . Recurrent pyelonephritis   . Seasonal allergic rhinitis   . Squamous cell carcinoma in situ (SCCIS) of skin of lower leg 05/04/2018   Dr. Janan Ridge, [facility]    Past Surgical History:  Procedure Laterality Date  . DEXA  12/2014   T score spine -2.4, hip -3.5 (Dr. Nori Riis, [facility].  [name] DILATION AND CURETTAGE OF UTERUS  2009  . SKIN LESION EXCISION Left 05/24/2018   SCC; Skin, Excision, Left Laeral leg:  postsurgical scar, no neoplasm is identified.  Skin, Shave bx and ED&C, right anterior thigh-Superficially invasive squamous cell carcinoma, extending to the deep margin.    Outpatient Medications Prior to Visit  Medication Sig Dispense Refill  . albuterol (PROVENTIL) (2.5 MG/3ML) 0.083% nebulizer solution Take 3 mLs (2.5 mg total) by nebulization every 6 (six) hours as needed for wheezing or shortness of breath. 75 mL 1  . alendronate (FOSAMAX) 70 MG tablet Take 1 tablet (70 mg total) by mouth every 7 (seven) days. Take with a full glass of water on an empty stomach. 4 tablet 11  . azelastine (ASTELIN) 0.1 % nasal spray Place 1 spray into both nostrils 2 (two) times daily. Use in each nostril as directed 30 mL 5  . hydrOXYzine (VISTARIL) 25 MG capsule Take 1-2 capsules (25-50 mg total) by mouth every 8 (eight) hours as needed. 60 capsule 0  . levofloxacin (LEVAQUIN) 500 MG tablet Take 1 tablet (500 mg total) by mouth daily. 7 tablet 0  . montelukast (SINGULAIR) 10 MG tablet Take 1 tablet (10 mg total) by mouth at bedtime. 90 tablet 3  . Multiple Vitamin (MULTIVITAMIN) tablet Take 1 tablet by mouth daily.    [name] Respiratory Therapy Supplies (FLUTTER) DEVI Twice a day and prn as needed, may increase if feeling worse 1 each 0  . Spacer/Aero-Holding Chambers (AEROCHAMBER MV) inhaler Use as instructed 1 each 2  .  SYMBICORT 160-4.5 MCG/ACT inhaler TAKE 2 PUFFS BY MOUTH TWICE A DAY 30.6 Inhaler 2  . tretinoin (RETIN-A) 0.025 % cream TAKE 1 APPLICATION THIN LAYER TO FACE NIGHTLY  1  . triamcinolone cream (KENALOG) 0.1 % Apply 1 application topically 2 (two) times daily. 30 g 0  . VENTOLIN HFA 108 (90 Base) MCG/ACT inhaler TAKE 2 PUFFS BY MOUTH EVERY 6 HOURS AS NEEDED FOR WHEEZE OR SHORTNESS OF BREATH 18 Inhaler 3  . VITAMIN D, CHOLECALCIFEROL, PO Take by mouth.    . fluorouracil (EFUDEX) 5 % cream APPLY1 APPLICATION ON THE SKIN AT BEDTIME FOR 4 WEEKS  3   No facility-administered medications prior to  visit.     No Known Allergies  ROS As per HPI  PE: Blood pressure 136/85, pulse (!) 120, temperature 98.7 F (37.1 C), temperature source Oral, resp. rate 17, height 5\' 5"  (1.651 m), weight 109 lb 8 oz (49.7 kg), SpO2 98 %. Gen: Alert, well appearing.  Patient is oriented to person, place, time, and situation. AFFECT: pleasant, lucid thought and speech. SKIN: she has diffusely tanned/hyperpigmented skin.  Over the extensor surfaces of both arms and both shoulders and upper back she has a scattered erythematous rash with dry/flaky papules that are not very distinct.  Hands with superficial desquamation diffusely, with a few barely palpable papulopustules. NO vesicles, no petechiae, no hives, no tenderness.  LABS:    Chemistry      Component Value Date/Time   NA 138 07/14/2018 1028   K 4.0 07/14/2018 1028   CL 101 07/14/2018 1028   CO2 30 07/14/2018 1028   BUN 21 07/14/2018 1028   CREATININE 1.19 07/14/2018 1028   CREATININE 0.72 03/18/2013 1212      Component Value Date/Time   CALCIUM 9.9 07/14/2018 1028   ALKPHOS 56 06/14/2018 1148   AST 25 06/14/2018 1148   ALT 17 06/14/2018 1148   BILITOT 0.8 06/14/2018 1148     Lab Results  Component Value Date   HGBA1C 6.0 11/16/2013   Lab Results  Component Value Date   WBC 8.1 07/14/2018   HGB 15.8 (H) 07/14/2018   HCT 47.3 (H) 07/14/2018   MCV 97.3 07/14/2018   PLT 230.0 07/14/2018     IMPRESSION AND PLAN:  Allergic contact dermatitis; poison sumac likely. Needs longer steroid taper: will treat with prednisone 40mg  qd x 7d, then 20mg  qd x 7d, then 10mg  qd x 7d. Continue hydroxyzine 25-50 mg tid prn.  An After Visit Summary was printed and given to the patient.  FOLLOW UP: Return if symptoms worsen or fail to improve after 5-7d.  Signed:  Crissie Sickles, MD           09/13/2018

## 2018-09-14 ENCOUNTER — Telehealth: Payer: Self-pay | Admitting: Pulmonary Disease

## 2018-09-14 NOTE — Telephone Encounter (Signed)
Notes recorded by Herma Carson, LPN on 0/11/6224 at 3:33 PM EDT ATC pt, went to voicemail. Left message to call back. ------  Notes recorded by Lauraine Rinne, NP on 09/14/2018 at 11:39 AM EDT Sputum culture grew out H flu. This will be treated with patient's Levaquin that she has. No need to change prescriptions.  We will continue to monitor the sputum culture as it grows out.  Keep follow-up with our office  Wyn Quaker, FNP  Called and spoke with pt letting her know the results of the sputum culture and stated to her that the abx she was put on will treat what as shown in the culture. Pt expressed understanding. Nothing further needed.

## 2018-09-14 NOTE — Progress Notes (Signed)
Sputum culture grew out H flu.  This will be treated with patient's Levaquin that she has.  No need to change prescriptions.  We will continue to monitor the sputum culture as it grows out.  Keep follow-up with our office  Wyn Quaker, FNP

## 2018-09-24 ENCOUNTER — Telehealth: Payer: Self-pay | Admitting: Family Medicine

## 2018-09-24 NOTE — Telephone Encounter (Signed)
[facility] calling regarding  Prednisone prescription filled on 09/13/18 Only filled for 21 d/s. They need a new prescription to fill the rest

## 2018-09-24 NOTE — Telephone Encounter (Signed)
Noted.  Great!

## 2018-09-24 NOTE — Telephone Encounter (Signed)
Do prednisone 10mg , 2 tabs po qd x 3d, then 1 tab po qd x 4d, then stop, #10 tabs, no RF. How is her rash? Thx.

## 2018-09-24 NOTE — Telephone Encounter (Signed)
[facility] and error was on their part. They filled 4 tbs qd x7 days, 3 tabs qd x7 days. They need to know how/if you would like to taper her off with 2 tabs qd x7 days or 1 tab qd x7days.  Please advise. Pharmacist did send report to corporate and file report of their mistake

## 2018-09-24 NOTE — Telephone Encounter (Signed)
Spoke w/ Merrilee Seashore at [facility] and advised correct taper for the rest of Prednisone 10mg .  Patient states the rash is much better since taking Prednisone.

## 2018-10-01 ENCOUNTER — Other Ambulatory Visit: Payer: Self-pay | Admitting: Pulmonary Disease

## 2018-10-08 ENCOUNTER — Ambulatory Visit: Payer: BLUE CROSS/BLUE SHIELD | Admitting: Pulmonary Disease

## 2018-10-11 LAB — RESPIRATORY CULTURE OR RESPIRATORY AND SPUTUM CULTURE
MICRO NUMBER:: 458023
RESULT:: NORMAL
SPECIMEN QUALITY:: ADEQUATE

## 2018-10-11 LAB — FUNGUS CULTURE W SMEAR
MICRO NUMBER:: 458022
SMEAR:: NONE SEEN
SPECIMEN QUALITY:: ADEQUATE

## 2018-10-24 ENCOUNTER — Other Ambulatory Visit: Payer: Self-pay | Admitting: Family Medicine

## 2018-10-26 LAB — MYCOBACTERIA,CULT W/FLUOROCHROME SMEAR
MICRO NUMBER:: 458024
SMEAR:: NONE SEEN
SPECIMEN QUALITY:: ADEQUATE

## 2018-11-01 ENCOUNTER — Other Ambulatory Visit: Payer: Self-pay

## 2018-11-01 MED ORDER — MONTELUKAST SODIUM 10 MG PO TABS: 10.0000 mg | ORAL_TABLET | Freq: Every day | ORAL | 0 refills | Status: DC

## 2018-11-01 NOTE — Progress Notes (Signed)
Faxed refill request for Singulair. Electronically sent to pharmacy.

## 2018-11-04 ENCOUNTER — Telehealth: Payer: Self-pay | Admitting: Family Medicine

## 2018-11-04 ENCOUNTER — Other Ambulatory Visit: Payer: Self-pay

## 2018-11-04 ENCOUNTER — Ambulatory Visit (INDEPENDENT_AMBULATORY_CARE_PROVIDER_SITE_OTHER): Payer: BC Managed Care – PPO | Admitting: Internal Medicine

## 2018-11-04 ENCOUNTER — Emergency Department (HOSPITAL_BASED_OUTPATIENT_CLINIC_OR_DEPARTMENT_OTHER)
Admission: EM | Admit: 2018-11-04 | Discharge: 2018-11-04 | Disposition: A | Discharge status: Home / Self Care | Payer: BC Managed Care – PPO | Attending: Emergency Medicine | Admitting: Emergency Medicine

## 2018-11-04 ENCOUNTER — Emergency Department (HOSPITAL_BASED_OUTPATIENT_CLINIC_OR_DEPARTMENT_OTHER): Payer: BC Managed Care – PPO

## 2018-11-04 ENCOUNTER — Telehealth: Payer: Self-pay | Admitting: Pulmonary Disease

## 2018-11-04 ENCOUNTER — Encounter (HOSPITAL_BASED_OUTPATIENT_CLINIC_OR_DEPARTMENT_OTHER): Payer: Self-pay | Admitting: Emergency Medicine

## 2018-11-04 DIAGNOSIS — Z20828 Contact with and (suspected) exposure to other viral communicable diseases: Secondary | ICD-10-CM | POA: Insufficient documentation

## 2018-11-04 DIAGNOSIS — Z79899 Other long term (current) drug therapy: Secondary | ICD-10-CM | POA: Insufficient documentation

## 2018-11-04 DIAGNOSIS — R509 Fever, unspecified: Secondary | ICD-10-CM

## 2018-11-04 DIAGNOSIS — J181 Lobar pneumonia, unspecified organism: Secondary | ICD-10-CM | POA: Insufficient documentation

## 2018-11-04 DIAGNOSIS — R05 Cough: Secondary | ICD-10-CM | POA: Diagnosis not present

## 2018-11-04 DIAGNOSIS — F17210 Nicotine dependence, cigarettes, uncomplicated: Secondary | ICD-10-CM | POA: Diagnosis not present

## 2018-11-04 DIAGNOSIS — R059 Cough, unspecified: Secondary | ICD-10-CM

## 2018-11-04 DIAGNOSIS — R000 Tachycardia, unspecified: Secondary | ICD-10-CM | POA: Diagnosis not present

## 2018-11-04 DIAGNOSIS — J449 Chronic obstructive pulmonary disease, unspecified: Secondary | ICD-10-CM | POA: Diagnosis not present

## 2018-11-04 DIAGNOSIS — R0602 Shortness of breath: Secondary | ICD-10-CM | POA: Diagnosis not present

## 2018-11-04 DIAGNOSIS — J189 Pneumonia, unspecified organism: Secondary | ICD-10-CM

## 2018-11-04 LAB — CBC WITH DIFFERENTIAL/PLATELET
Abs Immature Granulocytes: 0.06 10*3/uL (ref 0.00–0.07)
Basophils Absolute: 0.1 10*3/uL (ref 0.0–0.1)
Basophils Relative: 1 %
Eosinophils Absolute: 0.6 10*3/uL — ABNORMAL HIGH (ref 0.0–0.5)
Eosinophils Relative: 4 %
HCT: 52.9 % — ABNORMAL HIGH (ref 36.0–46.0)
Hemoglobin: 17.5 g/dL — ABNORMAL HIGH (ref 12.0–15.0)
Immature Granulocytes: 0 %
Lymphocytes Relative: 9 %
Lymphs Abs: 1.3 10*3/uL (ref 0.7–4.0)
MCH: 32.9 pg (ref 26.0–34.0)
MCHC: 33.1 g/dL (ref 30.0–36.0)
MCV: 99.4 fL (ref 80.0–100.0)
Monocytes Absolute: 1.1 10*3/uL — ABNORMAL HIGH (ref 0.1–1.0)
Monocytes Relative: 7 %
Neutro Abs: 11.6 10*3/uL — ABNORMAL HIGH (ref 1.7–7.7)
Neutrophils Relative %: 79 %
Platelets: 134 10*3/uL — ABNORMAL LOW (ref 150–400)
RBC: 5.32 MIL/uL — ABNORMAL HIGH (ref 3.87–5.11)
RDW: 13 % (ref 11.5–15.5)
WBC: 14.8 10*3/uL — ABNORMAL HIGH (ref 4.0–10.5)
nRBC: 0 % (ref 0.0–0.2)

## 2018-11-04 LAB — COMPREHENSIVE METABOLIC PANEL
ALT: 21 U/L (ref 0–44)
AST: 34 U/L (ref 15–41)
Albumin: 4 g/dL (ref 3.5–5.0)
Alkaline Phosphatase: 70 U/L (ref 38–126)
Anion gap: 11 (ref 5–15)
BUN: 10 mg/dL (ref 8–23)
CO2: 27 mmol/L (ref 22–32)
Calcium: 8.9 mg/dL (ref 8.9–10.3)
Chloride: 98 mmol/L (ref 98–111)
Creatinine, Ser: 0.65 mg/dL (ref 0.44–1.00)
GFR calc Af Amer: >60 mL/min (ref >60)
GFR calc non Af Amer: >60 mL/min (ref >60)
Glucose, Bld: 106 mg/dL — ABNORMAL HIGH (ref 70–99)
Potassium: 3.7 mmol/L (ref 3.5–5.1)
Sodium: 136 mmol/L (ref 135–145)
Total Bilirubin: 1.2 mg/dL (ref 0.3–1.2)
Total Protein: 7.1 g/dL (ref 6.5–8.1)

## 2018-11-04 LAB — SARS CORONAVIRUS 2 AG (30 MIN TAT): SARS Coronavirus 2 Ag: NEGATIVE

## 2018-11-04 MED ORDER — KETOROLAC TROMETHAMINE 15 MG/ML IJ SOLN
15.0000 mg | Freq: Once | INTRAMUSCULAR | Status: AC
  Administered 2018-11-04: 15 mg via INTRAVENOUS
  Filled 2018-11-04: qty 1

## 2018-11-04 MED ORDER — BENZONATATE 100 MG PO CAPS: 100.0000 mg | ORAL_CAPSULE | Freq: Three times a day (TID) | ORAL | 0 refills | Status: DC

## 2018-11-04 MED ORDER — SODIUM CHLORIDE 0.9 % IV BOLUS
1000.0000 mL | Freq: Once | INTRAVENOUS | Status: AC
  Administered 2018-11-04: 1000 mL via INTRAVENOUS

## 2018-11-04 MED ORDER — LEVOFLOXACIN 750 MG PO TABS: 750.0000 mg | ORAL_TABLET | Freq: Every day | ORAL | 0 refills | Status: DC

## 2018-11-04 NOTE — ED Triage Notes (Signed)
Pt reports cough and fever since last night, tachypnia  during triage and low O2 sat 89 RA. denies chest pain , reports headache. Alert and oriented x 4. Hx emphysema.

## 2018-11-04 NOTE — Telephone Encounter (Signed)
Daughter [name] calling in regards to her mother. States that she has a fever 104 with tylenol, cough and chest congestion. She is requesting for patient to be seen / virtual visit.  Please advise.  Sarah's number is [phone] or call home number of patient

## 2018-11-04 NOTE — Telephone Encounter (Signed)
Pt is not in distress and is not SOB. Daughter is going to do VV with mom from her smart phone. Daughter aware nurse will call for appt. Advised to take to ED if becomes SOB or fever does not go down with medication

## 2018-11-04 NOTE — Progress Notes (Signed)
Subjective:    [name], female    DOB: 1955/07/13, 63 y.o.   MRN: [id number]  DOS:  11/04/2018 Type of visit - description: Virtual Visit via Video Note  I connected with@ on 11/07/18 at 11:20 AM EDT by a video enabled telemedicine application and verified that I am speaking with the correct person using two identifiers.   THIS ENCOUNTER IS A VIRTUAL VISIT DUE TO COVID-19 - PATIENT WAS NOT SEEN IN THE OFFICE. PATIENT HAS CONSENTED TO VIRTUAL VISIT / TELEMEDICINE VISIT   Location of patient: home  Location of provider: office  I discussed the limitations of evaluation and management by telemedicine and the availability of in person appointments. The patient expressed understanding and agreed to proceed.  History of Present Illness: Acute The patient has a long history of pulmonary problems including COPD, bronchiectasis, ongoing tobacco abuse. Symptoms started 4 days ago with increased chest and sinus congestion.  She is producing some "creamy" sputum. She also had a temperature of 104.0 O2 sat at home ranged from 89 to 94%, this is lower than baseline, typically is in the 94%. Does not has oxygen at home.  The last time she was sick was last month, Haemophilus influenza growing the sputum, she was treated with Levaquin. After that she went back to baseline.    Review of Systems Admits to increased shortness of breath. Has some posterior mid back pain mostly with cough She does have mild unusual aches. Denies nausea, vomiting, diarrhea Admits to increase shortness of breath from baseline.  Past Medical History:  Diagnosis Date  . Basal cell carcinoma   . History of acute bronchitis 03/24/2011  . Lymphadenopathy, abdominal 03/2013   Noted on abd/pelv CT done to eval slow-to-resolve pyelo  . Meniere disorder 01/23/2012  . Osteoporosis 12/2014   Dr. Nori Riis, her GYN, dx'd her  . Recurrent pyelonephritis   . Seasonal allergic rhinitis   . Squamous cell  carcinoma in situ (SCCIS) of skin of lower leg 05/04/2018   Dr. Janan Ridge, [facility]    Past Surgical History:  Procedure Laterality Date  . DEXA  12/2014   T score spine -2.4, hip -3.5 (Dr. Nori Riis, [facility].  [name] DILATION AND CURETTAGE OF UTERUS  2009  . SKIN LESION EXCISION Left 05/24/2018   [facility]; Skin, Excision, Left Laeral leg: postsurgical scar, no neoplasm is identified.  Skin, Shave bx and ED&C, right anterior thigh-Superficially invasive squamous cell carcinoma, extending to the deep margin.    Social History   Socioeconomic History  . Marital status: Married    Spouse name: Not on file  . Number of children: Not on file  . Years of education: Not on file  . Highest education level: Not on file  Occupational History  . Occupation: housewife  Social Needs  . Financial resource strain: Not on file  . Food insecurity    Worry: Not on file    Inability: Not on file  . Transportation needs    Medical: Not on file    Non-medical: Not on file  Tobacco Use  . Smoking status: Current Every Day Smoker    Packs/day: 1.00    Years: 40.00    Pack years: 40.00    Types: Cigarettes    Start date: 06/01/1978  . Smokeless tobacco: Never Used  . Tobacco comment: Current 3/4PPD (12/12/16)  Substance and Sexual Activity  . Alcohol use: Yes    Alcohol/week: 7.0 standard drinks  Types: 7 Glasses of wine per week  . Drug use: No  . Sexual activity: Yes    Partners: Male  Lifestyle  . Physical activity    Days per week: Not on file    Minutes per session: Not on file  . Stress: Not on file  Relationships  . Social Herbalist on phone: Not on file    Gets together: Not on file    Attends religious service: Not on file    Active member of club or organization: Not on file    Attends meetings of clubs or organizations: Not on file    Relationship status: Not on file  . Intimate partner violence    Fear of current or ex partner:  Not on file    Emotionally abused: Not on file    Physically abused: Not on file    Forced sexual activity: Not on file  Other Topics Concern  . Not on file  Social History Narrative  . Not on file      Allergies as of 11/04/2018   No Known Allergies     Medication List       Accurate as of November 04, 2018 11:59 PM. If you have any questions, ask your nurse or doctor.        STOP taking these medications   predniSONE 10 MG tablet Commonly known as: DELTASONE Stopped by: Kathlene November, MD     TAKE these medications   AeroChamber MV inhaler Use as instructed   albuterol (2.5 MG/3ML) 0.083% nebulizer solution Commonly known as: PROVENTIL Take 3 mLs (2.5 mg total) by nebulization every 6 (six) hours as needed for wheezing or shortness of breath.   Ventolin HFA 108 (90 Base) MCG/ACT inhaler Generic drug: albuterol TAKE 2 PUFFS BY MOUTH EVERY 6 HOURS AS NEEDED FOR WHEEZE OR SHORTNESS OF BREATH   alendronate 70 MG tablet Commonly known as: FOSAMAX Take 1 tablet (70 mg total) by mouth every 7 (seven) days. Take with a full glass of water on an empty stomach.   azelastine 0.1 % nasal spray Commonly known as: ASTELIN Place 1 spray into both nostrils 2 (two) times daily. Use in each nostril as directed   benzonatate 100 MG capsule Commonly known as: TESSALON Take 1 capsule (100 mg total) by mouth every 8 (eight) hours.   fluorouracil 5 % cream Commonly known as: EFUDEX APPLY1 APPLICATION ON THE SKIN AT BEDTIME FOR 4 WEEKS   Flutter Devi Twice a day and prn as needed, may increase if feeling worse   gabapentin 100 MG capsule Commonly known as: NEURONTIN TAKE 2 CAPSULES (200 MG TOTAL) BY MOUTH AT BEDTIME.   hydrOXYzine 25 MG capsule Commonly known as: Vistaril Take 1-2 capsules (25-50 mg total) by mouth every 8 (eight) hours as needed.   levofloxacin 750 MG tablet Commonly known as: Levaquin Take 1 tablet (750 mg total) by mouth daily. X 7 days   montelukast 10 MG  tablet Commonly known as: SINGULAIR Take 1 tablet (10 mg total) by mouth at bedtime.   multivitamin tablet Take 1 tablet by mouth daily.   Symbicort 160-4.5 MCG/ACT inhaler Generic drug: budesonide-formoterol TAKE 2 PUFFS BY MOUTH TWICE A DAY   tretinoin 0.025 % cream Commonly known as: RETIN-A TAKE 1 APPLICATION THIN LAYER TO FACE NIGHTLY   triamcinolone cream 0.1 % Commonly known as: KENALOG Apply 1 application topically 2 (two) times daily.   VITAMIN D (CHOLECALCIFEROL) PO Take by mouth.  Objective:   Physical Exam There were no vitals taken for this visit. This is a virtual video visit, patient is alert oriented x3, in no distress    Assessment    63 year old female, history of bronchiectasis, COPD, nocturnal hypoxia, tobacco abuse presents with: Fever, increased cough: The patient become sick 4 days ago, reports a temperature of 104.0, right-sided posterior chest pain, increasing sputum production. O2 sats are lower than baseline. In the midst of a coronavirus pandemia I am concerned about this lady who has very low pulmonary reserve. Recommend evaluation at the ER, explained reasons, she agreed and states will go.

## 2018-11-04 NOTE — Telephone Encounter (Signed)
Pt woke up this AM sick. Pt watches grandkids and they have nasal congestion and one has bad cough and nasal congestion, none have had fevers. Pt was scheduled for VV with Dr Larose Kells in [facility]

## 2018-11-04 NOTE — ED Provider Notes (Signed)
[facility] EMERGENCY DEPARTMENT Provider Note   CSN: [id number] Arrival date & time: 11/04/18  1239    History   Chief Complaint Chief Complaint  Patient presents with  . Cough  . Fever    HPI [name] is a 63 y.o. female.     63 yo F with a chief complaint of fever and cough and right-sided posterior chest pain.  Been going on for a few days.  Been coughing up some thickish sputum which is somewhat similar to her past.  Has a history of bronchiectasis not requiring oxygen.  Temperature as high as 104 at home.  Saw her family doctor today by virtual visit and they recommended she come to the ED for evaluation.  Denies trauma to the chest denies history of PE or DVT denies unilateral edema denies estrogen use denies recent surgery hospitalization or immobilization.  The history is provided by the patient.  Cough Associated symptoms: fever   Associated symptoms: no chest pain, no chills, no headaches, no myalgias, no rhinorrhea, no shortness of breath and no wheezing   Fever Associated symptoms: cough   Associated symptoms: no chest pain, no chills, no congestion, no dysuria, no headaches, no myalgias, no nausea, no rhinorrhea and no vomiting   Illness Severity:  Moderate Onset quality:  Gradual Duration:  3 days Timing:  Constant Progression:  Worsening Chronicity:  New Associated symptoms: cough and fever   Associated symptoms: no chest pain, no congestion, no headaches, no myalgias, no nausea, no rhinorrhea, no shortness of breath, no vomiting and no wheezing     Past Medical History:  Diagnosis Date  . Basal cell carcinoma   . History of acute bronchitis 03/24/2011  . Lymphadenopathy, abdominal 03/2013   Noted on abd/pelv CT done to eval slow-to-resolve pyelo  . Meniere disorder 01/23/2012  . Osteoporosis 12/2014   Dr. Nori Riis, her GYN, dx'd her  . Recurrent pyelonephritis   . Seasonal allergic rhinitis   . Squamous cell carcinoma in situ (SCCIS) of  skin of lower leg 05/04/2018   Dr. Janan Ridge, [facility]    Patient Active Problem List   Diagnosis Date Noted  . Bronchiectasis with acute exacerbation ([place]) 09/10/2018  . Rash of hands 09/10/2018  . Nocturnal hypoxemia due to emphysema ([place]) 08/10/2018  . Avulsion fracture of lateral malleolus of right fibula 07/20/2018  . Bronchitis 05/20/2018  . Squamous cell carcinoma in situ (SCCIS) of skin of lower leg 05/04/2018  . Nonallopathic lesion of lumbosacral region 01/12/2018  . Nonallopathic lesion of sacral region 01/12/2018  . Nonallopathic lesion of thoracic region 01/12/2018  . Nonallopathic lesion of rib cage 01/12/2018  . Nonallopathic lesion of cervical region 01/12/2018  . Osteoporosis without current pathological fracture 10/28/2017  . Pyelonephritis 10/28/2017  . Low back pain 10/15/2017  . Polyarthralgia 10/15/2017  . Acute bursitis of right shoulder 09/16/2017  . Acute lateral meniscal tear 09/09/2017  . Abnormal CT scan of lung 04/23/2016  . Pulmonary nodules/lesions, multiple 04/23/2016  . Mediastinal adenopathy 02/22/2016  . Current every day smoker 02/22/2016  . Encounter for smoking cessation counseling 02/22/2016  . Hematuria 06/07/2015  . Meniere disorder 01/23/2012  . Squamous cell carcinoma   . Nicotine dependence 12/04/2008  . COPD (chronic obstructive pulmonary disease) ([place]) 12/04/2008    Past Surgical History:  Procedure Laterality Date  . DEXA  12/2014   T score spine -2.4, hip -3.5 (Dr. Nori Riis, [facility].  [name] DILATION AND CURETTAGE OF  UTERUS  2009  . SKIN LESION EXCISION Left 05/24/2018   SCC; Skin, Excision, Left La[place] leg: postsurgical scar, no neoplasm is identified.  Skin, Shave bx and ED&C, right anterior thigh-Superficially invasive squamous cell carcinoma, extending to the deep margin.     OB History   No obstetric history on file.      Home Medications    Prior to Admission medications    Medication Sig Start Date End Date Taking? Authorizing Provider  albuterol (PROVENTIL) (2.5 MG/3ML) 0.083% nebulizer solution Take 3 mLs (2.5 mg total) by nebulization every 6 (six) hours as needed for wheezing or shortness of breath. Patient not taking: Reported on 11/04/2018 05/07/18   Howard Pouch A, DO  alendronate (FOSAMAX) 70 MG tablet Take 1 tablet (70 mg total) by mouth every 7 (seven) days. Take with a full glass of water on an empty stomach. 05/07/18   Kuneff, Renee A, DO  azelastine (ASTELIN) 0.1 % nasal spray Place 1 spray into both nostrils 2 (two) times daily. Use in each nostril as directed 07/14/18   Chesley Mires, MD  benzonatate (TESSALON) 100 MG capsule Take 1 capsule (100 mg total) by mouth every 8 (eight) hours. 11/04/18   Deno Etienne, DO  fluorouracil (EFUDEX) 5 % cream APPLY1 APPLICATION ON THE SKIN AT BEDTIME FOR 4 WEEKS 02/24/18   [provider]  gabapentin (NEURONTIN) 100 MG capsule TAKE 2 CAPSULES (200 MG TOTAL) BY MOUTH AT BEDTIME. 10/25/18   Lyndal Pulley, DO  hydrOXYzine (VISTARIL) 25 MG capsule Take 1-2 capsules (25-50 mg total) by mouth every 8 (eight) hours as needed. 08/17/18   Kuneff, Renee A, DO  levofloxacin (LEVAQUIN) 750 MG tablet Take 1 tablet (750 mg total) by mouth daily. X 7 days 11/04/18   Deno Etienne, DO  montelukast (SINGULAIR) 10 MG tablet Take 1 tablet (10 mg total) by mouth at bedtime. 11/01/18   Kuneff, Renee A, DO  Multiple Vitamin (MULTIVITAMIN) tablet Take 1 tablet by mouth daily.    [provider]  Respiratory Therapy Supplies (FLUTTER) DEVI Twice a day and prn as needed, may increase if feeling worse 09/10/18   Lauraine Rinne, NP  Spacer/Aero-Holding Chambers (AEROCHAMBER MV) inhaler Use as instructed 02/02/17   Chesley Mires, MD  SYMBICORT 160-4.5 MCG/ACT inhaler TAKE 2 PUFFS BY MOUTH TWICE A DAY 09/03/18   Chesley Mires, MD  tretinoin (RETIN-A) 0.025 % cream TAKE 1 APPLICATION THIN LAYER TO FACE NIGHTLY 07/21/17   [provider]   triamcinolone cream (KENALOG) 0.1 % Apply 1 application topically 2 (two) times daily. 08/17/18   Kuneff, Renee A, DO  VENTOLIN HFA 108 (90 Base) MCG/ACT inhaler TAKE 2 PUFFS BY MOUTH EVERY 6 HOURS AS NEEDED FOR WHEEZE OR SHORTNESS OF BREATH 10/01/18   Chesley Mires, MD  VITAMIN D, CHOLECALCIFEROL, PO Take by mouth.    [provider]    Family History Family History  Problem Relation Age of Onset  . Hypertension Mother   . Glaucoma Mother   . Irritable bowel syndrome Mother   . Arthritis Mother   . Kidney disease Mother   . Diabetes Mother   . Hypertension Father   . Heart disease Father        bypasses  . Cancer Father        sarcoma  . Cancer Maternal Grandmother        breast  . Heart disease Maternal Grandmother        CHF  . Stroke Maternal Grandmother  in his 53's  . Stroke Maternal Grandfather   . Other Paternal Grandmother        hardening of the arteries  . Heart attack Paternal Grandfather   . Colon cancer Paternal Aunt 63  . Stomach cancer Neg Hx     Social History Social History   Tobacco Use  . Smoking status: Current Every Day Smoker    Packs/day: 1.00    Years: 40.00    Pack years: 40.00    Types: Cigarettes    Start date: 06/01/1978  . Smokeless tobacco: Never Used  . Tobacco comment: Current 3/4PPD (12/12/16)  Substance Use Topics  . Alcohol use: Yes    Alcohol/week: 7.0 standard drinks    Types: 7 Glasses of wine per week  . Drug use: No     Allergies   Patient has no known allergies.   Review of Systems Review of Systems  Constitutional: Positive for fever. Negative for chills.  HENT: Negative for congestion and rhinorrhea.   Eyes: Negative for redness and visual disturbance.  Respiratory: Positive for cough. Negative for shortness of breath and wheezing.   Cardiovascular: Negative for chest pain and palpitations.  Gastrointestinal: Negative for nausea and vomiting.  Genitourinary: Negative for dysuria and urgency.   Musculoskeletal: Negative for arthralgias and myalgias.  Skin: Negative for pallor and wound.  Neurological: Negative for dizziness and headaches.     Physical Exam Updated Vital Signs BP (!) 138/99 (BP Location: Right Arm)   Pulse (!) 127   Temp (!) 100.7 F (38.2 C) (Oral)   Resp (!) 28   Ht 5\' 5"  (1.651 m)   Wt 48.1 kg   SpO2 (!) 89%   BMI 17.64 kg/m   Physical Exam Vitals signs and nursing note reviewed.  Constitutional:      General: She is not in acute distress.    Appearance: She is well-developed. She is not diaphoretic.  HENT:     Head: Normocephalic and atraumatic.  Eyes:     Pupils: Pupils are equal, round, and reactive to light.  Neck:     Musculoskeletal: Normal range of motion and neck supple.  Cardiovascular:     Rate and Rhythm: Regular rhythm. Tachycardia present.     Heart sounds: No murmur. No friction rub. No gallop.   Pulmonary:     Effort: Pulmonary effort is normal.     Breath sounds: No wheezing or rales.  Abdominal:     General: There is no distension.     Palpations: Abdomen is soft.     Tenderness: There is no abdominal tenderness.  Musculoskeletal:        General: No tenderness.  Skin:    General: Skin is warm and dry.  Neurological:     Mental Status: She is alert and oriented to person, place, and time.  Psychiatric:        Behavior: Behavior normal.      ED Treatments / Results  Labs (all labs ordered are listed, but only abnormal results are displayed) Labs Reviewed  CBC WITH DIFFERENTIAL/PLATELET - Abnormal; Notable for the following components:      Result Value   WBC 14.8 (*)    RBC 5.32 (*)    Hemoglobin 17.5 (*)    HCT 52.9 (*)    Platelets 134 (*)    Neutro Abs 11.6 (*)    Monocytes Absolute 1.1 (*)    Eosinophils Absolute 0.6 (*)    All other components within normal limits  COMPREHENSIVE METABOLIC PANEL - Abnormal; Notable for the following components:   Glucose, Bld 106 (*)    All other components within  normal limits  SARS CORONAVIRUS 2 (HOSP ORDER, PERFORMED IN [facility] VIA [facility] ID)  CULTURE, BLOOD (ROUTINE X 2)    EKG None  Radiology Dg Chest Port 1 View  Result Date: 11/04/2018 CLINICAL DATA:  Shortness of breath and fever EXAM: PORTABLE CHEST 1 VIEW COMPARISON:  Sep 10, 2018 FINDINGS: There is a new airspace opacity in the right mid lung zone. The lungs remain hyperexpanded with findings concerning for emphysematous changes bilaterally. There are bibasilar airspace opacities similar to prior study favored to represent chronic atelectasis or scarring. There is blunting of the costophrenic angles bilaterally. There is no pneumothorax. Heart size is stable. There is no acute osseous abnormality. There are old healed right-sided rib fractures. IMPRESSION: 1. Focal consolidation in the right mid lung zone concerning for pneumonia or aspiration. Follow-up to radiologic resolution is recommended. 2. Hyperexpanded lungs, similar to prior study. Electronically Signed   By: Constance Holster M.D.   On: 11/04/2018 13:35    Procedures Procedures (including critical care time)  Medications Ordered in ED Medications  ketorolac (TORADOL) 15 MG/ML injection 15 mg (15 mg Intravenous Given 11/04/18 1316)  sodium chloride 0.9 % bolus 1,000 mL (0 mLs Intravenous Stopped 11/04/18 1435)     Initial Impression / Assessment and Plan / ED Course  I have reviewed the triage vital signs and the nursing notes.  Pertinent labs & imaging results that were available during my care of the patient were reviewed by me and considered in my medical decision making (see chart for details).        63 yo F with a chief complaint of cough fever and right-sided chest pain.  Going on for the past 3 days.  Feels like previously when she had pneumonia.  No known sick contacts.  History of bronchiectasis not chronically on oxygen.  Oxygen saturation here is in the upper 80s 89-90 on room air.  No increased work of  breathing.  Lung exam without any noted rales or rhonchi.  Will obtain a chest x-ray lab work rapid coronavirus test.  Patient is also tachycardic into the 120s which may be due to fever.  Will check basic lab work.  Discussed with her with her Oxygen saturation that typically I would have her come into the hospital which she is declining at this time.  Patient does have right middle lobe pneumonia on chest x-ray is viewed by me.  She is got a leukocytosis of 14.8.  No significant electrolyte abnormality.  Patient's oxygen saturation improved without intervention.  Now at her baseline at 94%.  Patient still is adamant that she wants to go home.  We will start her on Levaquin as that was the last antibiotic she was given for upper respiratory infection.  PCP follow-up.  2:47 PM:  I have discussed the diagnosis/risks/treatment options with the patient and believe the pt to be eligible for discharge home to follow-up with PCP, pulm. We also discussed returning to the ED immediately if new or worsening sx occur. We discussed the sx which are most concerning (e.g., sudden worsening sob) that necessitate immediate return. Medications administered to the patient during their visit and any new prescriptions provided to the patient are listed below.  Medications given during this visit Medications  ketorolac (TORADOL) 15 MG/ML injection 15 mg (15 mg Intravenous Given 11/04/18 1316)  sodium  chloride 0.9 % bolus 1,000 mL (0 mLs Intravenous Stopped 11/04/18 1435)     The patient appears reasonably screen and/or stabilized for discharge and I doubt any other medical condition or other [facility] requiring further screening, evaluation, or treatment in the ED at this time prior to discharge.    Final Clinical Impressions(s) / ED Diagnoses   Final diagnoses:  Community acquired pneumonia of right middle lobe of lung [facility])    ED Discharge Orders         Ordered    levofloxacin (LEVAQUIN) 750 MG tablet  Daily      11/04/18 1444    benzonatate (TESSALON) 100 MG capsule  Every 8 hours     11/04/18 [place], Ellen Goris, DO 11/04/18 1447

## 2018-11-04 NOTE — Telephone Encounter (Signed)
Left message for patient to call back.   Per her chart, it appears that she is currently being seen by her PCP now (1224p).

## 2018-11-04 NOTE — Discharge Instructions (Signed)
Return for worsening shortness of breath.  Take the antibiotics as prescribed.  Follow up with your family doc and pulmonologist.

## 2018-11-08 NOTE — Telephone Encounter (Signed)
Pt did end up going to ED 11/04/2018 to be evaluated. Nothing further needed.

## 2018-11-09 LAB — CULTURE, BLOOD (ROUTINE X 2)
Culture: NO GROWTH
Special Requests: ADEQUATE

## 2018-11-19 ENCOUNTER — Telehealth: Payer: Self-pay | Admitting: Pulmonary Disease

## 2018-11-19 ENCOUNTER — Telehealth: Payer: Self-pay

## 2018-11-19 MED ORDER — PREDNISONE 10 MG PO TABS: 10.0000 mg | ORAL_TABLET | Freq: Every day | ORAL | 0 refills | Status: DC

## 2018-11-19 MED ORDER — LEVOFLOXACIN 500 MG PO TABS: 500.0000 mg | ORAL_TABLET | Freq: Every day | ORAL | 0 refills | Status: DC

## 2018-11-19 NOTE — Telephone Encounter (Signed)
Noted.  How many days of Levaquin did the patient have?  Is the patient using a flutter valve?  How long as the patient had a fever?  Wyn Quaker, FNP

## 2018-11-19 NOTE — Telephone Encounter (Signed)
Called patient back. Patient temp is 100.3. she is not having any SOB. She has been off the Levaquin for 7 days now. I told the patient to contact Dr. Halford Chessman, since we cannot get her into our office today. I advised if she became SOB to go to ED.

## 2018-11-19 NOTE — Telephone Encounter (Signed)
Received VM stating pt had a virtual visit on 7/2 with Dr.Paz and advised to go to ED/ER. She went to [facility] and diagnosed with pneumonia. She was given Levaquin to take for 7 days and is still feeling bad. C/o head and chest congestion and low grade fever of 100.3. Last night it was 103.  Please advise, thanks.

## 2018-11-19 NOTE — Telephone Encounter (Signed)
Called & spoke w/ pt.   1. Pt states she has taken 7 days worth of Levaquin.  2. She denies using the flutter valve, stating she used it when it was first provided to her, but has not used it since.  3. She reports that she's had a fever since yesterday 11/18/2018 after lunchtime.   Aaron Edelman, please advise with your recommendations for this pt. Thank you.

## 2018-11-19 NOTE — Telephone Encounter (Signed)
Thank you.  Can offer:  Levaquin 500mg  tablet >>> Take 1 500 mg tablet daily for the next 5 days >>> Take with food >>> start probiotic for good gut health >>>avoid rigorous exercise for next 2 weeks   Prednisone 10mg  tablet  >>>4 tabs for 2 days, then 3 tabs for 2 days, 2 tabs for 2 days, then 1 tab for 2 days, then stop >>>take with food  >>>take in the morning    Patient needs to use flutter valve.  See instructions listed below:  Bronchiectasis: This is the medical term which indicates that you have damage, dilated airways making you more susceptible to respiratory infection. Use a flutter valve 10 breaths twice a day or 4 to 5 breaths 4-5 times a day to help clear mucus out Let us know if you have cough with change in mucus color or fevers or chills.  At that point you would need an antibiotic. Maintain a healthy nutritious diet, eating whole foods Take your medications as prescribed   Start using Mucinex 600 mg daily  Patient needs close follow-up with our office, schedule 1 to 2-week follow-up with our office with APP or patient's MD.  Wyn Quaker, FNP

## 2018-11-19 NOTE — Telephone Encounter (Signed)
Called & spoke w/ pt regarding Brian's recommendations. Pt verbalized understanding with no additional questions.   The following orders have been placed to pt's preferred pharmacy: Levaquin 500mg  tablet >>> Take 1 500 mg tablet daily for the next 5 days Prednisone 10mg  tablet  >>>4 tabs for 2 days, then 3 tabs for 2 days, 2 tabs for 2 days, then 1 tab for 2 days, then stop  Pt also agreed to scheduling a 1-2 week f/u appointment. Appt has been scheduled with Aaron Edelman 11/25/2018 at 9:30 AM.   Orders have been placed. Appt has been scheduled. I am sending Brian's recommendations to patient via MyChart message as requested by patient. Nothing further needed at this time.

## 2018-11-19 NOTE — Telephone Encounter (Signed)
Primary Pulmonologist: Sood Last office visit and with whom: 5.8.2020 w/ Mack NP What do we see them for (pulmonary problems): Bronchiectasis  Reason for call: called spoke with patient who reported that she began feeling better with the Levaquin given in the ED on 7.2.2020 for PNA (cxr on file in epic).  About 1 week after completing the Levaquin she began having a relapse of symptoms.  C/o prod cough with occasional green or creamy thick mucus, head congestion with clear drainage, PND.  Denies any wheezing, tightness in chest, hemoptysis, chest pain.  Advised patient with her symptoms it would be safer for her to have telephone or virtual visit (patient has done this type of visit before) but patient requested that message be forwarded to Aaron Edelman NP for recs on face-to-face visit vs virtual visit.  Please advise, thank you.  In the last month, have you been in contact with someone who was confirmed or suspected to have [place]avirus / COVID-19?  no  Do you have any of the following symptoms developed in the last 30 days? Fever: yes Cough: yes  Shortness of breath: no  When did your symptoms start?  1 week ago  If the patient has a fever, what is the last reading?  (use n/a if patient denies fever)  100.9 when patient called the office . IF THE PATIENT STATES THEY DO NOT OWN A THERMOMETER, THEY MUST GO AND PURCHASE ONE When did the fever start?: returned 1 week ago with the completion of Levaquin Have you taken any medication to suppress a fever (ie Ibuprofen, Aleve, Tylenol)?: no

## 2018-11-25 ENCOUNTER — Ambulatory Visit (INDEPENDENT_AMBULATORY_CARE_PROVIDER_SITE_OTHER): Payer: BC Managed Care – PPO | Admitting: Pulmonary Disease

## 2018-11-25 ENCOUNTER — Other Ambulatory Visit: Payer: Self-pay

## 2018-11-25 ENCOUNTER — Ambulatory Visit (INDEPENDENT_AMBULATORY_CARE_PROVIDER_SITE_OTHER): Payer: BC Managed Care – PPO

## 2018-11-25 ENCOUNTER — Encounter: Payer: Self-pay | Admitting: Pulmonary Disease

## 2018-11-25 VITALS — BP 120/72 | HR 95 | Temp 98.4°F | Ht 65.0 in | Wt 104.6 lb

## 2018-11-25 DIAGNOSIS — F17200 Nicotine dependence, unspecified, uncomplicated: Secondary | ICD-10-CM

## 2018-11-25 DIAGNOSIS — J449 Chronic obstructive pulmonary disease, unspecified: Secondary | ICD-10-CM

## 2018-11-25 DIAGNOSIS — J411 Mucopurulent chronic bronchitis: Secondary | ICD-10-CM

## 2018-11-25 DIAGNOSIS — F17210 Nicotine dependence, cigarettes, uncomplicated: Secondary | ICD-10-CM | POA: Diagnosis not present

## 2018-11-25 DIAGNOSIS — J181 Lobar pneumonia, unspecified organism: Secondary | ICD-10-CM | POA: Diagnosis not present

## 2018-11-25 DIAGNOSIS — R918 Other nonspecific abnormal finding of lung field: Secondary | ICD-10-CM | POA: Diagnosis not present

## 2018-11-25 DIAGNOSIS — J471 Bronchiectasis with (acute) exacerbation: Secondary | ICD-10-CM | POA: Diagnosis not present

## 2018-11-25 NOTE — Patient Instructions (Addendum)
Chest Xray today   Finish prednisone as planned   Sputum culture today   Continue Symbicort 160 >>> 2 puffs in the morning right when you wake up, rinse out your mouth after use, 12 hours later 2 puffs, rinse after use >>> Take this daily, no matter what >>> This is not a rescue inhaler   Only use your albuterol as a rescue medication to be used if you can't catch your breath by resting or doing a relaxed purse lip breathing pattern.  - The less you use it, the better it will work when you need it. - Ok to use up to 2 puffs every 4 hours if you must but call for immediate appointment if use goes up over your usual need - Don't leave home without it !! (think of it like the spare tire for your car)    Note your daily symptoms >remember "red flags" for COPD:  >>>Increase in cough >>>increase in sputum production >>>increase in shortness of breath or activity  intolerance.   If you notice these symptoms, please call the office to be seen.    Bronchiectasis: This is the medical term which indicates that you have damage, dilated airways making you more susceptible to respiratory infection. Use a flutter valve 10 breaths twice a day or 4 to 5 breaths 4-5 times a day to help clear mucus out Let us know if you have cough with change in mucus color or fevers or chills.  At that point you would need an antibiotic. Maintain a healthy nutritious diet, eating whole foods Take your medications as prescribed      We recommend that you stop smoking.  >>>You need to set a quit date >>>If you have friends or family who smoke, let them know you are trying to quit and not to smoke around you or in your living environment  Smoking Cessation Resources:  1 800 QUIT NOW  >>> Patient to call this resource and utilize it to help support her quit smoking >>> Keep up your hard work with stopping smoking  You can also contact the [facility] >>>For smoking cessation  classes call [phone]  We do not recommend using e-cigarettes as a form of stopping smoking  You can sign up for smoking cessation support texts and information:  >>>https://smokefree.gov/smokefreetxt    We will refer you today to her lung cancer screening program >>>This is based off of your 44 pack-year smoking history >>> This is a recommendation from the [place] preventative services task force (USPSTF) >>>The USPSTF recommends annual screening for lung cancer with low-dose computed tomography (LDCT) in adults aged 31 to 14 years who have a 30 pack-year smoking history and currently smoke or have quit within the past 15 years. Screening should be discontinued once a person has not smoked for 15 years or develops a health problem that substantially limits life expectancy or the ability or willingness to have curative lung surgery.   Our office will call you and set up an appointment with Eric Form (Nurse Practitioner) who leads this program.  This appointment takes place in our office.  After completing this meeting with Eric Form NP you will get a low-dose CT as the screening >>>We will call you with those results     Return in about 2 months (around 01/26/2019), or if symptoms worsen or fail to improve, for Follow up with Wyn Quaker FNP-C, Follow up with Dr. Halford Chessman.   Coronavirus (COVID-19) Are you at risk?  Are you at risk for the Coronavirus (COVID-19)?  To be considered HIGH RISK for Coronavirus (COVID-19), you have to meet the following criteria:  . Traveled to [place], [place], [place], [place] or [place]; or in the [place] to [place], [place], [place], or [place]; and have fever, cough, and shortness of breath within the last 2 weeks of travel OR . Been in close contact with a person diagnosed with COVID-19 within the last 2 weeks and have fever, cough, and shortness of breath . IF YOU DO NOT MEET THESE CRITERIA, YOU ARE CONSIDERED LOW RISK FOR  COVID-19.  What to do if you are HIGH RISK for COVID-19?  [name] If you are having a medical emergency, call 911. . Seek medical care right away. Before you go to a doctor's office, urgent care or emergency department, call ahead and tell them about your recent travel, contact with someone diagnosed with COVID-19, and your symptoms. You should receive instructions from your physician's office regarding next steps of care.  . When you arrive at healthcare provider, tell the healthcare staff immediately you have returned from visiting [place], [place], [place], [place] or [place]; or traveled in the [place] to [place], [place], [place], or [place]; in the last two weeks or you have been in close contact with a person diagnosed with COVID-19 in the last 2 weeks.   . Tell the health care staff about your symptoms: fever, cough and shortness of breath. . After you have been seen by a medical provider, you will be either: o Tested for (COVID-19) and discharged home on quarantine except to seek medical care if symptoms worsen, and asked to  - Stay home and avoid contact with others until you get your results (4-5 days)  - Avoid travel on public transportation if possible (such as bus, train, or airplane) or o Sent to the Emergency Department by EMS for evaluation, COVID-19 testing, and possible admission depending on your condition and test results.  What to do if you are LOW RISK for COVID-19?  Reduce your risk of any infection by using the same precautions used for avoiding the common cold or flu:  [name] Wash your hands often with soap and warm water for at least 20 seconds.  If soap and water are not readily available, use an alcohol-based hand sanitizer with at least 60% alcohol.  . If coughing or sneezing, cover your mouth and nose by coughing or sneezing into the elbow areas of your shirt or coat, into a tissue or into your sleeve (not your hands). . Avoid shaking hands with others and consider  head nods or verbal greetings only. . Avoid touching your eyes, nose, or mouth with unwashed hands.  . Avoid close contact with people who are sick. . Avoid places or events with large numbers of people in one location, like concerts or sporting events. . Carefully consider travel plans you have or are making. . If you are planning any travel outside or inside the [place], visit the CDC's Travelers' Health webpage for the latest health notices. . If you have some symptoms but not all symptoms, continue to monitor at home and seek medical attention if your symptoms worsen. . If you are having a medical emergency, call 911.   [place] / e-Visit: [email or web address]         [facility]: [facility]: [phone]  [facility]: [phone]           It is flu season:   >>> Best ways to protect herself from the flu: Receive the yearly flu vaccine, practice good hand hygiene washing with soap and also using hand sanitizer when available, eat a nutritious meals, get adequate rest, hydrate appropriately   Please contact the office if your symptoms worsen or you have concerns that you are not improving.   Thank you for choosing [facility] for your healthcare, and for allowing [place] to partner with you on your healthcare journey. I am thankful to be able to provide care to you today.   Wyn Quaker FNP-C     Health Risks of Smoking Smoking cigarettes is very bad for your health. Tobacco smoke has over 200 known poisons in it. It contains the poisonous gases nitrogen oxide and carbon monoxide. There are over 60 chemicals in tobacco smoke that cause cancer. Smoking is difficult to quit because a chemical in tobacco, called nicotine, causes addiction or dependence. When you smoke and inhale, nicotine is absorbed  rapidly into the bloodstream through your lungs. Both inhaled and non-inhaled nicotine may be addictive. What are the risks of cigarette smoke? Cigarette smokers have an increased risk of many serious medical problems, including:  Lung cancer.  Lung disease, such as pneumonia, bronchitis, and emphysema.  Chest pain (angina) and heart attack because the heart is not getting enough oxygen.  Heart disease and peripheral blood vessel disease.  High blood pressure (hypertension).  Stroke.  Oral cancer, including cancer of the lip, mouth, or voice box.  Bladder cancer.  Pancreatic cancer.  Cervical cancer.  Pregnancy complications, including premature birth.  Stillbirths and smaller newborn babies, birth defects, and genetic damage to sperm.  Early menopause.  Lower estrogen level for women.  Infertility.  Facial wrinkles.  Blindness.  Increased risk of broken bones (fractures).  Senile dementia.  Stomach ulcers and internal bleeding.  Delayed wound healing and increased risk of complications during surgery.  Even smoking lightly shortens your life expectancy by several years. Because of secondhand smoke exposure, children of smokers have an increased risk of the following:  Sudden infant death syndrome (SIDS).  Respiratory infections.  Lung cancer.  Heart disease.  Ear infections. What are the benefits of quitting? There are many health benefits of quitting smoking. Here are some of them:  Within days of quitting smoking, your risk of having a heart attack decreases, your blood flow improves, and your lung capacity improves. Blood pressure, pulse rate, and breathing patterns start returning to normal soon after quitting.  Within months, your lungs may clear up completely.  Quitting for 10 years reduces your risk of developing lung cancer and heart disease to almost that of a nonsmoker.  People who quit may see an improvement in their overall quality of  life. How do I quit smoking?     Smoking is an addiction with both physical and psychological effects, and longtime habits can be hard to change. Your health care provider can recommend:  Programs and community resources, which may include group support, education, or talk therapy.  Prescription medicines to help reduce cravings.  Nicotine replacement products, such as patches, gum, and nasal sprays. Use these products only as directed. Do not replace cigarette smoking with electronic cigarettes, which are commonly called e-cigarettes. The safety of e-cigarettes is not known, and some may contain harmful chemicals.  A combination of two or more of these methods.  Where to find more information  American Lung Association: www.lung.org  American Cancer Society: www.cancer.org Summary  Smoking cigarettes is very bad for your health. Cigarette smokers have an increased risk of many serious medical problems, including several cancers, heart disease, and stroke.  Smoking is an addiction with both physical and psychological effects, and longtime habits can be hard to change.  By stopping right away, you can greatly reduce the risk of medical problems for you and your family.  To help you quit smoking, your health care provider can recommend programs, community resources, prescription medicines, and nicotine replacement products such as patches, gum, and nasal sprays. This information is not intended to replace advice given to you by your health care provider. Make sure you discuss any questions you have with your health care provider. Document Released: 05/29/2004 Document Revised: 07/23/2017 Document Reviewed: 04/25/2016 Elsevier Patient Education  2020 [employer].

## 2018-11-25 NOTE — Progress Notes (Signed)
Improved chest x-ray today.  This is good news.  Need to receive pneumonia vaccine at next follow-up with primary care or with our office.  Wyn Quaker, FNP

## 2018-11-25 NOTE — Assessment & Plan Note (Signed)
Assessment Patient recently with a right middle lobe pneumonia on chest x-ray Treated with Levaquin, finishing a prednisone Patient reports cough and sputum production are back to baseline with yellow to tan mucus Patient with known bronchiectasis on 2018 CT chest 2018 sputum culture grew out M catarrhalis May/2020 sputum culture grew out H influenza  Plan: Sputum culture today Chest x-ray today Continue flutter valve use, discussed and educated patient today regarding flutter valve settings Follow-up with our office in 2 months

## 2018-11-25 NOTE — Progress Notes (Signed)
@Patient  ID: [name], female    DOB: October 26, 1955, 63 y.o.   MRN: [id number]  Chief Complaint  Patient presents with  . Follow-up    Cough, recently treated Levaquin    Referring provider: Ma Hillock, DO  HPI:  63 year old female current everyday smoker followed in our office for COPD, bronchiectasis  PMH: Back pain Smoker/ Smoking History: Current Smoker.  1 pack/day. 40-pack-year smoking history. Maintenance:  Symbicort 160 Pt of: Dr. Halford Chessman  11/25/2018  - Visit   63 year old female current every day smoker followed in our office for COPD.  Patient still smoking 1 pack/day.  She is not interested in stopping smoking this time.  Patient is maintained on Symbicort 160.  Patient is recently completed 2 rounds of Levaquin after having right middle lobe.  Patient feels that she is returning back to baseline.  Sputum color color has changed from green to her baseline of yellow to tan.  Patient has history of sputum cultures growing out M catarrhalis in 2018, and H. influenzae in 2020.  Patient reports that she is remaining active at home with her 8 grandkids as well as her large garden.   Tests:   Pulmonary tests:  PFT 12/15/08 FEV1 2.20(90%), FVC 3.68(113%), FEV1% 60, TLC 6.54(132%), DLCO 75%, no BD response Sputum culture 06/27/16 >> Moraxella catarrhalis PFT 08/25/16 >> FEV1 1.55 (59%), FEV1% 57, TLC 5.33 (103%), DLCO 52% ONO with RA 07/29/18 >> test time 6 hrs 23 min.  Basal SpO2 86%, low SpO2 72%.  Spent 289.2 min with SpO2 < 88%.  09/10/2018-Respiratory sputum-H. Influenzae  Cardiac tests:  CT chest 04/21/16 >> borderline LAN, nodules in lung bases with tree in bud CT chest 10/22/16 >> moderate centrilobular emphysema, 1.2 cm nodule LUL, tree in bud, RLL BTX CT chest 01/26/17 >> moderate centrilobular emphysema, mild b/l BTX with tree in bud, LUL nodule resolved, stable 7 mm RLL nodule since 2015  11/04/2018-SARS-CoV-2-negative  11/04/2018-chest x-ray-focal consolidation in  right midlung zone concerning for pneumonia or aspiration, hyperexpanded lungs  FENO:  No results found for: NITRICOXIDE  PFT: PFT Results Latest Ref Rng & Units 08/25/2016  FVC-Pre L 2.63  FVC-Predicted Pre % 77  FVC-Post L 2.72  FVC-Predicted Post % 80  Pre FEV1/FVC % % 55  Post FEV1/FCV % % 57  FEV1-Pre L 1.45  FEV1-Predicted Pre % 55  FEV1-Post L 1.55  DLCO UNC% % 52  DLCO COR %Predicted % 61  TLC L 5.33  TLC % Predicted % 103  RV % Predicted % 125    Imaging: Dg Chest Port 1 View  Result Date: 11/04/2018 CLINICAL DATA:  Shortness of breath and fever EXAM: PORTABLE CHEST 1 VIEW COMPARISON:  Sep 10, 2018 FINDINGS: There is a new airspace opacity in the right mid lung zone. The lungs remain hyperexpanded with findings concerning for emphysematous changes bilaterally. There are bibasilar airspace opacities similar to prior study favored to represent chronic atelectasis or scarring. There is blunting of the costophrenic angles bilaterally. There is no pneumothorax. Heart size is stable. There is no acute osseous abnormality. There are old healed right-sided rib fractures. IMPRESSION: 1. Focal consolidation in the right mid lung zone concerning for pneumonia or aspiration. Follow-up to radiologic resolution is recommended. 2. Hyperexpanded lungs, similar to prior study. Electronically Signed   By: Constance Holster M.D.   On: 11/04/2018 13:35      Specialty Problems      Pulmonary Problems   COPD (chronic obstructive  pulmonary disease) ([place])    PFT 08/25/16 >> FEV1 1.55 (59%), FEV1% 57, TLC 5.33 (103%), DLCO 52%  CT chest 01/26/17 >> moderate centrilobular emphysema, mild b/l BTX with tree in bud, LUL nodule resolved, stable 7 mm RLL nodule since 2015       Pulmonary nodules/lesions, multiple   Bronchitis   Nocturnal hypoxemia due to emphysema ([place])    ONO with RA 07/29/18 >> test time 6 hrs 23 min.  Basal SpO2 86%, low SpO2 72%.  Spent 289.2 min with SpO2 < 88%.       Bronchiectasis with acute exacerbation ([place])    CT chest 01/26/17 >> moderate centrilobular emphysema, mild b/l BTX with tree in bud, LUL nodule resolved, stable 7 mm RLL nodule since 2015  Sputum culture 06/27/16 >> Moraxella catarrhalis  09/10/2018-Respiratory sputum-H. Influenzae         No Known Allergies  Immunization History  Administered Date(s) Administered  . Influenza Split 04/09/2011  . Influenza Whole 02/02/2009  . Influenza,inj,Quad PF,6+ Mos 01/26/2014, 02/06/2017, 03/04/2018  . Influenza-Unspecified 02/02/2013, 02/24/2015, 04/03/2016  . Zoster Recombinat (Shingrix) 03/04/2018   Needs Pneumonia vaccine   Past Medical History:  Diagnosis Date  . Basal cell carcinoma   . History of acute bronchitis 03/24/2011  . Lymphadenopathy, abdominal 03/2013   Noted on abd/pelv CT done to eval slow-to-resolve pyelo  . Meniere disorder 01/23/2012  . Osteoporosis 12/2014   Dr. Nori Riis, her GYN, dx'd her  . Recurrent pyelonephritis   . Seasonal allergic rhinitis   . Squamous cell carcinoma in situ (SCCIS) of skin of lower leg 05/04/2018   Dr. Janan Ridge, [facility]    Tobacco History: Social History   Tobacco Use  Smoking Status Current Every Day Smoker  . Packs/day: 1.00  . Years: 40.00  . Pack years: 40.00  . Types: Cigarettes  . Start date: 06/01/1978  Smokeless Tobacco Never Used   Ready to quit: No Counseling given: Yes  Smoking assessment and cessation counseling  Patient currently smoking: 0.75 ppd I have advised the patient to quit/stop smoking as soon as possible due to high risk for multiple medical problems.  It will also be very difficult for [place] to manage patient's  respiratory symptoms and status if we continue to expose her lungs to a known irritant.  We do not advise e-cigarettes as a form of stopping smoking.  Patient is not willing to quit smoking.  I have advised the patient that we can assist and have options of nicotine replacement  therapy, provided smoking cessation education today, provided smoking cessation counseling, and provided cessation resources.  Not interested in stopping smoking at this time.  Will refer to lung cancer screening for preventative measures.  Follow-up next office visit office visit for assessment of smoking cessation.    Smoking cessation counseling advised for: 5 min    Outpatient Encounter Medications as of 11/25/2018  Medication Sig  . albuterol (PROVENTIL) (2.5 MG/3ML) 0.083% nebulizer solution Take 3 mLs (2.5 mg total) by nebulization every 6 (six) hours as needed for wheezing or shortness of breath.  [name] alendronate (FOSAMAX) 70 MG tablet Take 1 tablet (70 mg total) by mouth every 7 (seven) days. Take with a full glass of water on an empty stomach.  [name] azelastine (ASTELIN) 0.1 % nasal spray Place 1 spray into both nostrils 2 (two) times daily. Use in each nostril as directed  . fluorouracil (EFUDEX) 5 % cream APPLY1 APPLICATION ON THE SKIN AT BEDTIME FOR 4  WEEKS  . levofloxacin (LEVAQUIN) 500 MG tablet Take 1 tablet (500 mg total) by mouth daily. Take with food.  . montelukast (SINGULAIR) 10 MG tablet Take 1 tablet (10 mg total) by mouth at bedtime.  . Multiple Vitamin (MULTIVITAMIN) tablet Take 1 tablet by mouth daily.  . predniSONE (DELTASONE) 10 MG tablet Take 1 tablet (10 mg total) by mouth daily with breakfast. Take 4 tablets for 2 days, then 3 tabs for 2 days, 2 tabs for 2 days, then 1 tab for 2 days, then stop. Take with food.  [name] Respiratory Therapy Supplies (FLUTTER) DEVI Twice a day and prn as needed, may increase if feeling worse  . Spacer/Aero-Holding Chambers (AEROCHAMBER MV) inhaler Use as instructed  . SYMBICORT 160-4.5 MCG/ACT inhaler TAKE 2 PUFFS BY MOUTH TWICE A DAY  . tretinoin (RETIN-A) 0.025 % cream TAKE 1 APPLICATION THIN LAYER TO FACE NIGHTLY  . triamcinolone cream (KENALOG) 0.1 % Apply 1 application topically 2 (two) times daily.  . VENTOLIN HFA 108 (90 Base) MCG/ACT  inhaler TAKE 2 PUFFS BY MOUTH EVERY 6 HOURS AS NEEDED FOR WHEEZE OR SHORTNESS OF BREATH  . VITAMIN D, CHOLECALCIFEROL, PO Take by mouth.  . benzonatate (TESSALON) 100 MG capsule Take 1 capsule (100 mg total) by mouth every 8 (eight) hours. (Patient not taking: Reported on 11/25/2018)  . gabapentin (NEURONTIN) 100 MG capsule TAKE 2 CAPSULES (200 MG TOTAL) BY MOUTH AT BEDTIME. (Patient not taking: Reported on 11/25/2018)  . hydrOXYzine (VISTARIL) 25 MG capsule Take 1-2 capsules (25-50 mg total) by mouth every 8 (eight) hours as needed. (Patient not taking: Reported on 11/25/2018)  . [DISCONTINUED] levofloxacin (LEVAQUIN) 750 MG tablet Take 1 tablet (750 mg total) by mouth daily. X 7 days (Patient not taking: Reported on 11/25/2018)   No facility-administered encounter medications on file as of 11/25/2018.      Review of Systems  Review of Systems  Constitutional: Positive for fatigue. Negative for activity change and fever.  HENT: Negative for sinus pressure, sinus pain and sore throat.   Respiratory: Positive for cough and shortness of breath. Negative for wheezing.   Cardiovascular: Negative for chest pain and palpitations.  Gastrointestinal: Negative for diarrhea, nausea and vomiting.  Musculoskeletal: Negative for arthralgias.  Neurological: Negative for dizziness.  Psychiatric/Behavioral: Negative for sleep disturbance. The patient is not nervous/anxious.      Physical Exam  BP 120/72 (BP Location: Left Arm, Cuff Size: Normal)   Pulse 95   Temp 98.4 F (36.9 C) (Oral)   Ht 5\' 5"  (1.651 m)   Wt 104 lb 9.6 oz (47.4 kg)   SpO2 94%   BMI 17.41 kg/m   Wt Readings from Last 5 Encounters:  11/25/18 104 lb 9.6 oz (47.4 kg)  11/04/18 106 lb (48.1 kg)  09/13/18 109 lb 8 oz (49.7 kg)  09/10/18 107 lb 12.8 oz (48.9 kg)  08/17/18 106 lb 6 oz (48.3 kg)    Physical Exam Vitals signs and nursing note reviewed.  Constitutional:      General: She is not in acute distress.    Appearance:  She is underweight.  HENT:     Head: Normocephalic and atraumatic.     Right Ear: Tympanic membrane, ear canal and external ear normal. There is no impacted cerumen.     Left Ear: Tympanic membrane, ear canal and external ear normal. There is no impacted cerumen.     Nose: Nose normal. No congestion.     Mouth/Throat:     Mouth:  Mucous membranes are moist.     Pharynx: Oropharynx is clear.  Eyes:     Pupils: Pupils are equal, round, and reactive to light.  Neck:     Musculoskeletal: Normal range of motion.  Cardiovascular:     Rate and Rhythm: Normal rate and regular rhythm.     Pulses: Normal pulses.     Heart sounds: Normal heart sounds. No murmur.  Pulmonary:     Effort: Pulmonary effort is normal. No respiratory distress.     Breath sounds: No decreased air movement. Wheezing (exp wheeze ) present. No decreased breath sounds or rales.  Skin:    General: Skin is warm and dry.     Capillary Refill: Capillary refill takes less than 2 seconds.  Neurological:     General: No focal deficit present.     Mental Status: She is alert and oriented to person, place, and time. Mental status is at baseline.     Gait: Gait normal.  Psychiatric:        Mood and Affect: Mood normal.        Behavior: Behavior normal.        Thought Content: Thought content normal.        Judgment: Judgment normal.      Lab Results:  CBC    Component Value Date/Time   WBC 14.8 (H) 11/04/2018 1319   RBC 5.32 (H) 11/04/2018 1319   HGB 17.5 (H) 11/04/2018 1319   HGB 14.6 06/01/2013 1107   HCT 52.9 (H) 11/04/2018 1319   HCT 44.9 06/01/2013 1107   PLT 134 (L) 11/04/2018 1319   PLT 199 06/01/2013 1107   MCV 99.4 11/04/2018 1319   MCV 93 06/01/2013 1107   MCH 32.9 11/04/2018 1319   MCHC 33.1 11/04/2018 1319   RDW 13.0 11/04/2018 1319   RDW 13.6 06/01/2013 1107   LYMPHSABS 1.3 11/04/2018 1319   LYMPHSABS 2.5 06/01/2013 1107   MONOABS 1.1 (H) 11/04/2018 1319   EOSABS 0.6 (H) 11/04/2018 1319    EOSABS 0.0 06/01/2013 1107   BASOSABS 0.1 11/04/2018 1319   BASOSABS 0.0 06/01/2013 1107    BMET    Component Value Date/Time   NA 136 11/04/2018 1319   K 3.7 11/04/2018 1319   CL 98 11/04/2018 1319   CO2 27 11/04/2018 1319   GLUCOSE 106 (H) 11/04/2018 1319   BUN 10 11/04/2018 1319   CREATININE 0.65 11/04/2018 1319   CREATININE 0.72 03/18/2013 1212   CALCIUM 8.9 11/04/2018 1319   GFRNONAA >60 11/04/2018 1319   GFRAA >60 11/04/2018 1319    BNP No results found for: BNP  ProBNP No results found for: PROBNP    Assessment & Plan:   Current every day smoker Plan: Counseled patient today and recommended that she stop smoking Patient knows to contact our office to see that she is ready to stop smoking Referral to lung cancer screening program, do not complete shared decision-making or CT screening until after October/2020 as patient is recovering from pneumonia Patient needs pneumonia vaccine at next office visit  COPD (chronic obstructive pulmonary disease) ([place]) Assessment: Patient followed in our office for COPD, CT chest in 2018 shows moderate centrilobular emphysema Current everyday smoker, 1 pack/day, 44-pack-year smoking history Maintained on Symbicort 160 Patient currently recovering from right middle lobe pneumonia>>> treated with Levaquin, finishing a prednisone Breath sounds clear on auscultation today, slight expiratory wheeze  Plan: Chest x-ray today Referral to lung cancer screening program Continue Symbicort 160 Follow-up with our office  in 2 months Emphasized again the importance of stopping smoking   Bronchiectasis with acute exacerbation [facility]) Assessment Patient recently with a right middle lobe pneumonia on chest x-ray Treated with Levaquin, finishing a prednisone Patient reports cough and sputum production are back to baseline with yellow to tan mucus Patient with known bronchiectasis on 2018 CT chest 2018 sputum culture grew out M catarrhalis  May/2020 sputum culture grew out H influenza  Plan: Sputum culture today Chest x-ray today Continue flutter valve use, discussed and educated patient today regarding flutter valve settings Follow-up with our office in 2 months    Return in about 2 months (around 01/26/2019), or if symptoms worsen or fail to improve, for Follow up with Wyn Quaker FNP-C, Follow up with Dr. Halford Chessman.   Lauraine Rinne, NP 11/25/2018   This appointment was 32 minutes long with over 50% of the time in direct face-to-face patient care, assessment, plan of care, and follow-up.

## 2018-11-25 NOTE — Addendum Note (Signed)
Addended by: Suzzanne Cloud E on: 11/25/2018 10:25 AM   Modules accepted: Orders

## 2018-11-25 NOTE — Assessment & Plan Note (Signed)
Plan: Counseled patient today and recommended that she stop smoking Patient knows to contact our office to see that she is ready to stop smoking Referral to lung cancer screening program, do not complete shared decision-making or CT screening until after October/2020 as patient is recovering from pneumonia Patient needs pneumonia vaccine at next office visit

## 2018-11-25 NOTE — Assessment & Plan Note (Signed)
Assessment: Patient followed in our office for COPD, CT chest in 2018 shows moderate centrilobular emphysema Current everyday smoker, 1 pack/day, 44-pack-year smoking history Maintained on Symbicort 160 Patient currently recovering from right middle lobe pneumonia>>> treated with Levaquin, finishing a prednisone Breath sounds clear on auscultation today, slight expiratory wheeze  Plan: Chest x-ray today Referral to lung cancer screening program Continue Symbicort 160 Follow-up with our office in 2 months Emphasized again the importance of stopping smoking

## 2018-11-28 LAB — RESPIRATORY CULTURE OR RESPIRATORY AND SPUTUM CULTURE
MICRO NUMBER:: 697334
RESULT:: NORMAL
SPECIMEN QUALITY:: ADEQUATE

## 2018-11-29 ENCOUNTER — Telehealth: Payer: Self-pay | Admitting: Pulmonary Disease

## 2018-11-29 DIAGNOSIS — J479 Bronchiectasis, uncomplicated: Secondary | ICD-10-CM

## 2018-11-29 NOTE — Progress Notes (Signed)
Sputum culture results are showing normal oral pharyngeal flora.  This is good news.  No acute changes at this time.  Please contact our office if you have any worsening concerns or symptoms.  Wyn Quaker, FNP

## 2018-11-29 NOTE — Telephone Encounter (Signed)
Notes recorded by Lauraine Rinne, NP on 11/29/2018 at 8:49 AM EDT  Sputum culture results are showing normal oral pharyngeal flora. This is good news. No acute changes at this time.   Please contact our office if you have any worsening concerns or symptoms.   Wyn Quaker, FNP   Attempted to call pt but unable to reach. Left message for pt to return call.

## 2018-11-29 NOTE — Progress Notes (Signed)
Patient was contacted regarding results.  See separate triage note regarding patient's additional symptoms.  Wyn Quaker, FNP

## 2018-11-29 NOTE — Telephone Encounter (Signed)
Primary Pulmonologist: [facility] Last office visit and with whom: 11/25/2018 with Wyn Quaker What do we see them for (pulmonary problems): bronchiectasis/pna Last OV assessment/plan: Instructions    Return in about 2 months (around 01/26/2019), or if symptoms worsen or fail to improve, for Follow up with Wyn Quaker FNP-C, Follow up with Dr. Halford Chessman. Chest Xray today  Finish prednisone as planned   Sputum culture today   Continue Symbicort160 >>> 2 puffs in the morning right when you wake up, rinse out your mouth after use, 12 hours later 2 puffs, rinse after use >>> Take this daily, no matter what >>> This is not a rescue inhaler  Only use your albuterol as a rescue medication to be used if you can't catch your breath by resting or doing a relaxed purse lip breathing pattern.  - The less you use it, the better it will work when you need it. - Ok to use up to 2 puffs every 4 hours if you must but call for immediate appointment if use goes up over your usual need - Don't leave home without it !! (think of it like the spare tire for your car)   Note your daily symptoms >remember "red flags" for COPD:  >>>Increase in cough >>>increase in sputum production >>>increase in shortness of breath or activity intolerance.   If you notice these symptoms, please call the office to be seen.   Bronchiectasis: This is the medical term which indicates that you have damage, dilated airways making you more susceptible to respiratory infection. Use a flutter valve 10 breaths twice a day or 4 to 5 breaths 4-5 times a day to help clear mucus out Let us know if you have cough with change in mucus color or fevers or chills. At that point you would need an antibiotic. Maintain a healthy nutritious diet, eating whole foods Take your medications as prescribed     We recommend that you stop smoking.  >>>You need to set a quit date >>>If you have friends or family who smoke, let them know  you are trying to quit and not to smoke around you or in your living environment  Smoking Cessation Resources:  1 800 QUIT NOW  >>> Patient to call this resource and utilize it to help support her quit smoking >>> Keep up your hard work with stopping smoking  You can also contact the [facility] >>>For smoking cessation classes call [phone]  We do not recommend using e-cigarettes as a form of stopping smoking  You can sign up for smoking cessation support texts and information:  >>>https://smokefree.gov/smokefreetxt    We will refer you today to her lung cancer screening program >>>This is based off of your 44 pack-year smoking history >>> This is a recommendation from the [place] preventative services task force (USPSTF) >>>The USPSTF recommends annual screening for lung cancer with low-dose computed tomography (LDCT) in adults aged 71 to 42 years who have a 30 pack-year smoking history and currently smoke or have quit within the past 15 years. Screening should be discontinued once a person has not smoked for 15 years or develops a health problem that substantially limits life expectancy or the ability or willingness to have curative lung surgery.   Our office will call you and set up an appointment with Eric Form (Nurse Practitioner) who leads this program.  This appointment takes place in our office.  After completing this meeting with Eric Form NP you will get a low-dose CT as  the screening >>>We will call you with those results     Return in about 2 months (around 01/26/2019), or if symptoms worsen or fail to improve, for Follow up with Wyn Quaker FNP-C, Follow up with Dr. Halford Chessman.      Was appointment offered to patient (explain)?  Pt wants recommendations from Aaron Edelman   Reason for call: Called and spoke with pt and stated to pt the results of the sputum culture.  Per pt, she felt fine at [place] 7/23 and felt fine Friday 7/24 but then last night 7/26  pt began running a low grade fever of 99.5 and began coughing up green phlegm at bedtime.   Pt had finished the abx she was originally prescribed 7/21 and then 5 days after the abx she started feeling bad again.  Pt said her temp today 7/27 was 99.9 and due to how she was feeling, she took an advil. Pt has done one neb treatment today to see if that would help with her symptoms.  Pt is still taking singulair and symbicort as prescribed. Pt also took mucinex this morning to see if that would help.  Pt wanted recommendations from Aaron Edelman to see what he has to say in regards to this. Pt was told that Aaron Edelman had already left and she said it was fine for this to wait until he returned to office tomorrow morning, 7/28 since he was the last provider pt saw she wants to hear recs from him.  Pt said if she had to, she would take an advil to see if that would help and I stated to her for the temp it would probably be best for her to take tylenol and she verbalized understanding.  Routing to [place] for him to take care of Tuesday, 7/28.

## 2018-11-29 NOTE — Telephone Encounter (Signed)
Sorry to hear the patient is not feeling well.  Patient's recent sputum culture is negative.  Patient has had 2 rounds of Levaquin.  Patient needs further evaluation with CT imaging.    Please order a high-resolution CT chest.  This needs to be completed as soon as possible ideally within the next week.  In the comment section of high resolution CT chest order please put:  -High-res CT with supine and prone positioning -inspiratory and expiratory cuts.  -Only to be read by Dr. Rosario Jacks and Dr. Weber Cooks.   Patient with negative COVID test 3 weeks ago when patient presented to the emergency room.  Please screen the patient for SARS-CoV-2 symptoms.  If patient has any concerns or recent exposures to anyone in the community we will likely need to consider outpatient COVID testing.  If patient has any concerning exposures over the last 3 weeks please route this to me so that we can coordinate testing if needed as well as this may delay her CT.  Patient to use Tylenol or ibuprofen over-the-counter for management of fever.  Patient needs to ensure she is using her flutter valve at least 2 times daily 10 breaths each time.  Patient can increase this to 3 times daily 10 breaths each time.  Patient needs to be hydrating well.  Patient should be taking Mucinex 600 mg every 12 hours.  We will not do repeat antibiotics at this point in time.  We need further CT imaging to further evaluate.  Pt also needs to stop smoking.   Wyn Quaker FNP

## 2018-11-29 NOTE — Progress Notes (Signed)
See result note.  B

## 2018-11-29 NOTE — Telephone Encounter (Signed)
Pt is calling back [phone]

## 2018-11-30 ENCOUNTER — Other Ambulatory Visit: Payer: Self-pay

## 2018-11-30 ENCOUNTER — Ambulatory Visit (HOSPITAL_BASED_OUTPATIENT_CLINIC_OR_DEPARTMENT_OTHER)
Admission: RE | Admit: 2018-11-30 | Discharge: 2018-11-30 | Disposition: A | Discharge status: Home / Self Care | Payer: BC Managed Care – PPO | Source: Ambulatory Visit | Attending: Pulmonary Disease | Admitting: Pulmonary Disease

## 2018-11-30 DIAGNOSIS — J439 Emphysema, unspecified: Secondary | ICD-10-CM | POA: Diagnosis not present

## 2018-11-30 DIAGNOSIS — J189 Pneumonia, unspecified organism: Secondary | ICD-10-CM | POA: Diagnosis not present

## 2018-11-30 DIAGNOSIS — J479 Bronchiectasis, uncomplicated: Secondary | ICD-10-CM

## 2018-11-30 NOTE — Telephone Encounter (Signed)
Spoke with pt. She is aware of Brian's response. Order has been placed for HRCT. Nothing further was needed at this time.

## 2018-12-02 ENCOUNTER — Telehealth: Payer: Self-pay | Admitting: Pulmonary Disease

## 2018-12-02 NOTE — Telephone Encounter (Signed)
Will leave encounter open and await pt to return call.

## 2018-12-02 NOTE — Progress Notes (Signed)
I attempted to contact the patient today to discuss the CT chest high-res results.   High-resolution CT chest results showing interval worsening of bronchial impaction and peribronchial vascular nodularity and consolidation in both lower lobes right greater than left.  Patient likely needs to be set up for a bronchoscopy to get a better culture of the sputum that she is coughing up as well as to further evaluate the worsening CT results.  I have already discussed this with Dr. Halford Chessman he agrees and is willing to do the bronchoscopy if the patient agrees.  I have informed the patient to contact our office so we could further discuss.  Wyn Quaker, FNP

## 2018-12-02 NOTE — Telephone Encounter (Signed)
12/02/2018 1159  Attempted to contact the patient today to discuss high-resolution CT chest results.  I was unable to reach the patient.  I left a message for the patient to contact our office back.  I have discussed the high-resolution CT chest results with Dr. Halford Chessman.  I believe the patient needs to be set up for bronchoscopy to further evaluate her intermittent fevers, worsening consolidation, and worsened CT chest results.  Current smoker. Patient has history of sputum growing out M catarrhalis and H. influenzae in the past.  Patient has already received 2 rounds of Levaquin.  Patient still having intermittent fevers and worsened chest congestion with green purulent mucus.  Routing to Dr. Halford Chessman as [name].   Wyn Quaker, FNP

## 2018-12-03 ENCOUNTER — Encounter: Payer: Self-pay | Admitting: Pulmonary Disease

## 2018-12-03 NOTE — Telephone Encounter (Signed)
Called and spoke with pt in regards to the HRCT and that recommendations per VS is for pt to have a bronch performed. While speaking to pt, pt became confused in regards to the results. Asked pt if she would like to speak with Aaron Edelman as he had said she could speak with him if wanted to. Pt said that she would like to speak with Aaron Edelman.

## 2018-12-03 NOTE — Telephone Encounter (Signed)
12/03/2018 1007  I have spoken with the patient.  All questions were answered.  Patient is not on a blood thinner.  Patient is currently out of town and will return on 12/07/2018 in the afternoon.  Patient agrees to proceed forward with bronchoscopy with Dr. Halford Chessman.   Will route to Dr. Halford Chessman as [name].  We will need to start the process about getting patient scheduled.  We will also need to get the patient scheduled for a SARS-CoV-2 test ideally on 12/08/2018.  Patient knows to contact our office if she has additional questions or concerns.  Wyn Quaker, FNP

## 2018-12-03 NOTE — Telephone Encounter (Signed)
Wyn Quaker, NP reports Dr. Halford Chessman will coordinate the procedure scheduling. Will close this encounter.

## 2018-12-03 NOTE — Telephone Encounter (Signed)
Pt returning call and can be reached @ [phone].[name]

## 2018-12-06 NOTE — Telephone Encounter (Signed)
Dr. Halford Chessman,       For patient's bronch would you like me to help with scheduling?  What dates are you available so I can check with Baxter Flattery?  Do you need fluro?  TB risk?   Thank you,  Burman Nieves

## 2018-12-07 NOTE — Telephone Encounter (Signed)
I will call her later this week to schedule.

## 2018-12-14 NOTE — Progress Notes (Signed)
Reviewed and agree with assessment/plan.   Merion Grimaldo, MD [facility] 04/30/2016, 12:24 PM Pager:  [phone]  

## 2018-12-25 NOTE — Progress Notes (Signed)
Reviewed and agree with assessment/plan.   Nimsi Males, MD [facility] 04/30/2016, 12:24 PM Pager:  [phone]  

## 2019-01-04 ENCOUNTER — Telehealth: Payer: Self-pay | Admitting: Pulmonary Disease

## 2019-01-04 NOTE — Telephone Encounter (Signed)
Can you please schedule tele visit with me to discuss her current symptom status and whether bronchoscopy culture testing is still needed.  Okay to double book visit.

## 2019-01-04 NOTE — Telephone Encounter (Signed)
Has been seen by Aaron Edelman several times since I last saw her.  Prefer to have visit with me, and if everything okay then she doesn't need appt on 9/23.

## 2019-01-04 NOTE — Telephone Encounter (Signed)
Called spoke with patient. She states she is currently not having any symptoms. And she has an appt with Wyn Quaker on 9/23. Is this appointment ok to discuss this issue or does she need to make a televisit with Dr. Halford Chessman sooner?  Dr. Halford Chessman please advise

## 2019-01-04 NOTE — Telephone Encounter (Signed)
LMTCB x 1 

## 2019-01-26 ENCOUNTER — Ambulatory Visit (INDEPENDENT_AMBULATORY_CARE_PROVIDER_SITE_OTHER): Payer: BC Managed Care – PPO

## 2019-01-26 ENCOUNTER — Ambulatory Visit (INDEPENDENT_AMBULATORY_CARE_PROVIDER_SITE_OTHER): Payer: BC Managed Care – PPO | Admitting: Pulmonary Disease

## 2019-01-26 ENCOUNTER — Ambulatory Visit: Payer: BC Managed Care – PPO | Admitting: Pulmonary Disease

## 2019-01-26 ENCOUNTER — Encounter: Payer: Self-pay | Admitting: Pulmonary Disease

## 2019-01-26 ENCOUNTER — Other Ambulatory Visit: Payer: Self-pay

## 2019-01-26 VITALS — BP 142/80 | HR 90 | Temp 97.4°F | Ht 65.0 in | Wt 108.2 lb

## 2019-01-26 DIAGNOSIS — Z8701 Personal history of pneumonia (recurrent): Secondary | ICD-10-CM

## 2019-01-26 DIAGNOSIS — G4734 Idiopathic sleep related nonobstructive alveolar hypoventilation: Secondary | ICD-10-CM

## 2019-01-26 DIAGNOSIS — J432 Centrilobular emphysema: Secondary | ICD-10-CM

## 2019-01-26 DIAGNOSIS — Z23 Encounter for immunization: Secondary | ICD-10-CM

## 2019-01-26 DIAGNOSIS — R918 Other nonspecific abnormal finding of lung field: Secondary | ICD-10-CM | POA: Diagnosis not present

## 2019-01-26 MED ORDER — ANORO ELLIPTA 62.5-25 MCG/INH IN AEPB: 1.0000 | INHALATION_SPRAY | Freq: Every day | RESPIRATORY_TRACT | 5 refills | Status: DC

## 2019-01-26 NOTE — Progress Notes (Signed)
[facility]  Chief Complaint  Patient presents with  . Bronchiectassis without complication    Patient stated her breathing is much better. Rarely uses nebulizer. Uses rescue only as needed.    Constitutional:  BP (!) 142/80 (BP Location: Right Arm, Patient Position: Sitting, Cuff Size: Normal)   Pulse 90   Temp (!) 97.4 F (36.3 C)   Ht 5\' 5"  (1.651 m)   Wt 108 lb 3.2 oz (49.1 kg)   SpO2 95%   BMI 18.01 kg/m   Past Medical History:  Pyelonephritis, Osteoporosis, Meniere disorder, Basal cell carcinoma  Brief Summary:  [name] is a 63 y.o. female smokerwithCOPD/emphysema.  Saw Wyn Quaker over the Summer.  Had sputum culture that grew Haemophilus influenzae.  AFB and fungal cultures negative from May.  Treated for pneumonia in July.  Her breathing has been doing better.  Not having as much cough or sputum.  Her phlegm is clear.  Not having chest pain, wheeze, fever, hemoptysis, or leg swelling.  Still smoking.  Had ONO done with low oxygen.  She was worried about other people would say about her if she needed to use oxygen, and therefore never got oxygen set up.  Physical Exam:   Appearance - thin  ENMT - no sinus tenderness, no nasal discharge, no oral exudate  Neck - no masses, trachea midline, no thyromegaly, no elevation in JVP  Respiratory - normal appearance of chest wall, normal respiratory effort w/o accessory muscle use, no dullness on percussion, prolonged exhalation, no wheezing or rales  CV - s1s2 regular rate and rhythm, no murmurs, no peripheral edema, radial pulses symmetric  GI - soft, non tender  Lymph - no adenopathy noted in neck and axillary areas  MSK - normal gait  Ext - no cyanosis, clubbing, or joint inflammation noted  Skin - no rashes, lesions, or ulcers  Neuro - normal strength, oriented x 3  Psych - anxious  Assessment/Plan:   Recurrent respiratory infections. - repeat CXR today and  if she has persistent infiltrate, would then need bronchoscopy with additional airway sampling  COPD with chronic bronchitis and emphysema. - concerned about risk of infections with ICS - stop symbicort and change to anoro - continue singulair, prn albuterol - influenza and pneumovax vaccinations today  Tobacco abuse. - she will try to gradually quit  Allergic rhinitis. - astelin, singulair  Polycythemia. - reviewed her ONO results - explained how chronic hypoxia can impact her health and quality of life - she will think about whether she would want to repeat ONO and then arrange for home oxygen   She has requested that health information not be shared with her daughter without her prior approval.   Patient Instructions  Chest xray today  Influenza and Pneumonia 23 vaccines today  Stop symbicort  Anoro 1 puff daily  Follow up in 6 months   Chesley Mires, MD [facility] Pager: [phone] 01/26/2019, 10:59 AM  Flow Sheet     Pulmonary tests:  PFT 12/15/08 FEV1 2.20(90%), FVC 3.68(113%), FEV1% 60, TLC 6.54(132%), DLCO 75%, no BD response Sputum culture 06/27/16 >> Moraxella catarrhalis PFT 08/25/16 >> FEV1 1.55 (59%), FEV1% 57, TLC 5.33 (103%), DLCO 52% A1AT 07/14/18 >> 166, MM  Sleep testing:  ONO with RA 07/29/18 >> test time 6 hrs 23 min.  Basal SpO2 86%, low SpO2 72%.  Spent 289.2 min with SpO2 < 88%.  Chest imaging:  CT chest 04/21/16 >> borderline LAN,  nodules in lung bases with tree in bud CT chest 10/22/16 >> moderate centrilobular emphysema, 1.2 cm nodule LUL, tree in bud, RLL BTX CT chest 01/26/17 >> moderate centrilobular emphysema, mild b/l BTX with tree in bud, LUL nodule resolved, stable 7 mm RLL nodule since 2015 HRCT 12/01/18 >> atherosclerosis, apical scarring, centrilobular emphysema, nodularity in RLL, 6 mm nodule Rt major fissure  Medications:   Allergies as of 01/26/2019   No Known Allergies     Medication List        Accurate as of January 26, 2019 10:59 AM. If you have any questions, ask your nurse or doctor.        STOP taking these medications   benzonatate 100 MG capsule Commonly known as: TESSALON Stopped by: Chesley Mires, MD   levofloxacin 500 MG tablet Commonly known as: LEVAQUIN Stopped by: Chesley Mires, MD   predniSONE 10 MG tablet Commonly known as: DELTASONE Stopped by: Chesley Mires, MD   Symbicort 160-4.5 MCG/ACT inhaler Generic drug: budesonide-formoterol Stopped by: Chesley Mires, MD   triamcinolone cream 0.1 % Commonly known as: KENALOG Stopped by: Chesley Mires, MD     TAKE these medications   AeroChamber MV inhaler Use as instructed   albuterol (2.5 MG/3ML) 0.083% nebulizer solution Commonly known as: PROVENTIL Take 3 mLs (2.5 mg total) by nebulization every 6 (six) hours as needed for wheezing or shortness of breath.   Ventolin HFA 108 (90 Base) MCG/ACT inhaler Generic drug: albuterol TAKE 2 PUFFS BY MOUTH EVERY 6 HOURS AS NEEDED FOR WHEEZE OR SHORTNESS OF BREATH   alendronate 70 MG tablet Commonly known as: FOSAMAX Take 1 tablet (70 mg total) by mouth every 7 (seven) days. Take with a full glass of water on an empty stomach.   Anoro Ellipta 62.5-25 MCG/INH Aepb Generic drug: umeclidinium-vilanterol Inhale 1 puff into the lungs daily. Started by: Chesley Mires, MD   azelastine 0.1 % nasal spray Commonly known as: ASTELIN Place 1 spray into both nostrils 2 (two) times daily. Use in each nostril as directed   fluorouracil 5 % cream Commonly known as: EFUDEX APPLY1 APPLICATION ON THE SKIN AT BEDTIME FOR 4 WEEKS   Flutter Devi Twice a day and prn as needed, may increase if feeling worse   gabapentin 100 MG capsule Commonly known as: NEURONTIN TAKE 2 CAPSULES (200 MG TOTAL) BY MOUTH AT BEDTIME.   hydrOXYzine 25 MG capsule Commonly known as: Vistaril Take 1-2 capsules (25-50 mg total) by mouth every 8 (eight) hours as needed.   montelukast 10 MG tablet  Commonly known as: SINGULAIR Take 1 tablet (10 mg total) by mouth at bedtime.   multivitamin tablet Take 1 tablet by mouth daily.   tretinoin 0.025 % cream Commonly known as: RETIN-A TAKE 1 APPLICATION THIN LAYER TO FACE NIGHTLY   VITAMIN D (CHOLECALCIFEROL) PO Take 5,000 Units by mouth daily.       Past Surgical History:  She  has a past surgical history that includes Dilation and curettage of uterus (2009); DEXA (12/2014); and Skin lesion excision (Left, 05/24/2018).  Family History:  Her family history includes Arthritis in her mother; Cancer in her father and maternal grandmother; Colon cancer (age of onset: 42) in her paternal aunt; Diabetes in her mother; Glaucoma in her mother; Heart attack in her paternal grandfather; Heart disease in her father and maternal grandmother; Hypertension in her father and mother; Irritable bowel syndrome in her mother; Kidney disease in her mother; Other in her paternal grandmother; Stroke in  her maternal grandfather and maternal grandmother.  Social History:  She  reports that she has been smoking cigarettes. She started smoking about 40 years ago. She has a 40.00 pack-year smoking history. She has never used smokeless tobacco. She reports current alcohol use of about 7.0 standard drinks of alcohol per week. She reports that she does not use drugs.

## 2019-01-26 NOTE — Patient Instructions (Signed)
Chest xray today  Influenza and Pneumonia 23 vaccines today  Stop symbicort  Anoro 1 puff daily  Follow up in 6 months

## 2019-01-27 ENCOUNTER — Telehealth: Payer: Self-pay | Admitting: Pulmonary Disease

## 2019-01-27 NOTE — Telephone Encounter (Signed)
Seen in office on 01/26/19.

## 2019-01-27 NOTE — Telephone Encounter (Signed)
Dg Chest 2 View  Result Date: 01/26/2019 CLINICAL DATA:  History of pneumonia, follow-up study. EXAM: CHEST - 2 VIEW COMPARISON:  Chest x-ray 11/04/2018 FINDINGS: Heart size is stable and normal. The aortic mediastinal contours are unremarkable. No signs of consolidation or pleural effusion. Lungs remain hyperinflated. No acute bone finding. IMPRESSION: Hyperinflation without acute cardiopulmonary disease. Electronically Signed   By: Zetta Bills M.D.   On: 01/26/2019 16:08    Please let her know that CXR shows expected changes of emphysema.  No residual effects from Pneumonia over the Summer.  She does not need bronchoscopy at this time.

## 2019-01-27 NOTE — Telephone Encounter (Signed)
Pt aware of results of cxr and no need for bronch at this time. Nothing further needed.

## 2019-02-02 ENCOUNTER — Other Ambulatory Visit: Payer: Self-pay

## 2019-02-02 MED ORDER — MONTELUKAST SODIUM 10 MG PO TABS: 10.0000 mg | ORAL_TABLET | Freq: Every day | ORAL | 0 refills | Status: DC

## 2019-03-08 ENCOUNTER — Ambulatory Visit (INDEPENDENT_AMBULATORY_CARE_PROVIDER_SITE_OTHER): Payer: BC Managed Care – PPO | Admitting: Family Medicine

## 2019-03-08 ENCOUNTER — Encounter: Payer: Self-pay | Admitting: Family Medicine

## 2019-03-08 VITALS — Ht 65.0 in

## 2019-03-08 DIAGNOSIS — R1032 Left lower quadrant pain: Secondary | ICD-10-CM | POA: Diagnosis not present

## 2019-03-08 NOTE — Progress Notes (Signed)
  Virtual Visit via Video Note  I connected with [name] on 03/08/19 at  3:30 PM EST by a video enabled telemedicine application and verified that I am speaking with the correct person using two identifiers.  Location: Patient: In home setting Provider: In office setting   I discussed the limitations of evaluation and management by telemedicine and the availability of in person appointments. The patient expressed understanding and agreed to proceed.  History of Present Illness: Patient is a very pleasant 63 year old female who unfortunately started having increasing abdominal pain after having a cough for some days.  Patient states that she had a severe pain that seems to radiate towards her back.  Seem to be left side.  Patient states that it seems to be more around the pelvic area on the left side.  Patient states that there is a bulge.  Seems to get worse with any type of coughing or even bowel movements.  Has not been running a fever but last checked was 99.7.  Patient denies any nausea or vomiting denies any changes in bowel habits.    Observations/Objective: Patient was on a phone call because the virtual platform did not work.  Unable to see stomach.  Patient able to speak in full sentences.  Did not cough on the phone.  When asked to do a Valsalva patient states that it did increase discomfort and pain possible increase in size she stated to   Assessment and Plan: Acute onset of abdominal pain with swelling left side after coughing.  Concern for potential hernia, unable to see patient today and patient wanted to do the virtual call.  Unfortunately visual platform did not work.  Patient will have a limited abdominal ultrasound done to further evaluate.  Patient knows if nausea vomiting, increasing fever, increasing pain to seek medical attention immediately.  Patient is in agreement with the plan.   Follow Up Instructions: After abdominal ultrasound    I discussed the assessment  and treatment plan with the patient. The patient was provided an opportunity to ask questions and all were answered. The patient agreed with the plan and demonstrated an understanding of the instructions.   The patient was advised to call back or seek an in-person evaluation if the symptoms worsen or if the condition fails to improve as anticipated.  I provided *19minutes of non-face-to-face time during this encounter.   Lyndal Pulley, DO

## 2019-03-09 ENCOUNTER — Telehealth: Payer: Self-pay | Admitting: Pulmonary Disease

## 2019-03-09 DIAGNOSIS — R509 Fever, unspecified: Secondary | ICD-10-CM

## 2019-03-09 DIAGNOSIS — J432 Centrilobular emphysema: Secondary | ICD-10-CM

## 2019-03-09 NOTE — Telephone Encounter (Signed)
Called and spoke with pt letting her know that we needed her to be covid tested due to symptoms and pt verbalized understanding. Told pt location for her to go to be tested and also provided her with times she could go to be tested and also made her aware that she did not need to have an appt.  Also stated to pt the other recs per EW and told her once we had results of covid test, we would call her with results and any other recs. Pt verbalized understanding. Nothing further needed.

## 2019-03-09 NOTE — Telephone Encounter (Signed)
I would repeat covid testing, she could have been exposed since negative test in July Take tylenol 650mg  every 4-6 hours as needed for fever Stay well hydrated Continue Anoro and use nebulizer 3 times a day

## 2019-03-09 NOTE — Telephone Encounter (Signed)
Primary Pulmonologist: Dr. Halford Chessman Last office visit and with whom: 01/26/2019 with Dr. Halford Chessman What do we see them for (pulmonary problems): centrilobular emphysema  Reason for call: Called and spoke with pt who states she has had complaints of a cold which she says is now down in her chest. Pt also has been running a temp x1 day now. Pt was tested for COVID 11/04/2018 and that covid test was negative.  Pt said that her her cold started 5 days ago on 03/04/2019 which began in her head but now is down in her chest. Pt is coughing up thick yellow phlegm and also has yellow nasal drainage.  Pt said the highest her temp had gotten to was 100.2  x1 day ago and she said she has been alternating between tylenol and advil to help with her temp. Pt said temp today 11/4 was 99.8 this AM and again after lunch which pt did take either tylenol or advil which is not really helping to keep the temp down. Pt has had to use her rescue inhaler at least 3 times daily. Pt has not had to use her neb machine. Pt is still taking anoro as prescribed and will occ use her flutter valve.  Pt wants to know what we recommend to help with her symptoms. Beth, please advise on this for pt. Thanks!  In the last month, have you been in contact with someone who was confirmed or suspected to have Conoravirus / COVID-19?  no  Do you have any of the following symptoms developed in the last 30 days? Fever: yes Cough: yes Shortness of breath: no  When did your symptoms start?  5 days ago on 03/04/2019  If the patient has a fever, what is the last reading?  (use n/a if patient denies fever)  99.8 . IF THE PATIENT STATES THEY DO NOT OWN A THERMOMETER, THEY MUST GO AND PURCHASE ONE When did the fever start?: 1 day ago 03/08/2019 Have you taken any medication to suppress a fever (ie Ibuprofen, Aleve, Tylenol)?: yes, been alternating between tylenol and advil  TRIAGE :: REMIND PATIENT TO SELF-ISOLATE WHILE THEY'RE MESSAGE IS BEING HANDLED

## 2019-03-10 ENCOUNTER — Other Ambulatory Visit: Payer: Self-pay

## 2019-03-10 ENCOUNTER — Telehealth: Payer: Self-pay | Admitting: Pulmonary Disease

## 2019-03-10 DIAGNOSIS — Z20822 Contact with and (suspected) exposure to covid-19: Secondary | ICD-10-CM

## 2019-03-10 NOTE — Telephone Encounter (Signed)
Called and spoke to patient. Patient stated that she is having same symptoms and went to get tested for COVID today.  Asked about following instructions per Geraldo Pitter, NP from telephone encounter from 03/09/2019.  Patient stated she hasn't attempted to try the nebulizer. Patient stated she is taking care of multiple grandchildren and has been active today.  Encouraged patient to rest and follow instructions from NP. Patient stated she will call us back if symptoms get worse.  Advisded patient if after hours and symptoms get worse to seek emergency care.   Recommendations were as follows:  "I would repeat covid testing, she could have been exposed since negative test in July Take tylenol 650mg  every 4-6 hours as needed for fever Stay well hydrated Continue Anoro and use nebulizer 3 times a day"

## 2019-03-11 ENCOUNTER — Other Ambulatory Visit: Payer: Self-pay | Admitting: Family Medicine

## 2019-03-11 DIAGNOSIS — R1032 Left lower quadrant pain: Secondary | ICD-10-CM

## 2019-03-12 LAB — NOVEL CORONAVIRUS, NAA: SARS-CoV-2, NAA: NOT DETECTED

## 2019-03-15 DIAGNOSIS — L570 Actinic keratosis: Secondary | ICD-10-CM | POA: Diagnosis not present

## 2019-03-15 DIAGNOSIS — D225 Melanocytic nevi of trunk: Secondary | ICD-10-CM | POA: Diagnosis not present

## 2019-03-15 DIAGNOSIS — D692 Other nonthrombocytopenic purpura: Secondary | ICD-10-CM | POA: Diagnosis not present

## 2019-03-15 DIAGNOSIS — C44729 Squamous cell carcinoma of skin of left lower limb, including hip: Secondary | ICD-10-CM | POA: Diagnosis not present

## 2019-03-15 DIAGNOSIS — L578 Other skin changes due to chronic exposure to nonionizing radiation: Secondary | ICD-10-CM | POA: Diagnosis not present

## 2019-03-15 DIAGNOSIS — D485 Neoplasm of uncertain behavior of skin: Secondary | ICD-10-CM | POA: Diagnosis not present

## 2019-03-15 DIAGNOSIS — L821 Other seborrheic keratosis: Secondary | ICD-10-CM | POA: Diagnosis not present

## 2019-03-15 DIAGNOSIS — C44619 Basal cell carcinoma of skin of left upper limb, including shoulder: Secondary | ICD-10-CM | POA: Diagnosis not present

## 2019-03-16 ENCOUNTER — Encounter: Payer: Self-pay | Admitting: Family Medicine

## 2019-03-16 ENCOUNTER — Other Ambulatory Visit: Payer: Self-pay | Admitting: *Deleted

## 2019-03-16 ENCOUNTER — Other Ambulatory Visit: Payer: Self-pay

## 2019-03-16 ENCOUNTER — Ambulatory Visit (INDEPENDENT_AMBULATORY_CARE_PROVIDER_SITE_OTHER)
Admission: RE | Admit: 2019-03-16 | Discharge: 2019-03-16 | Disposition: A | Discharge status: Home / Self Care | Payer: BC Managed Care – PPO | Source: Ambulatory Visit | Attending: Family Medicine | Admitting: Family Medicine

## 2019-03-16 ENCOUNTER — Ambulatory Visit
Admission: RE | Admit: 2019-03-16 | Discharge: 2019-03-16 | Disposition: A | Discharge status: Home / Self Care | Payer: BC Managed Care – PPO | Source: Ambulatory Visit | Attending: Family Medicine | Admitting: Family Medicine

## 2019-03-16 ENCOUNTER — Ambulatory Visit (INDEPENDENT_AMBULATORY_CARE_PROVIDER_SITE_OTHER): Payer: BC Managed Care – PPO | Admitting: Family Medicine

## 2019-03-16 ENCOUNTER — Other Ambulatory Visit (INDEPENDENT_AMBULATORY_CARE_PROVIDER_SITE_OTHER): Payer: BC Managed Care – PPO

## 2019-03-16 VITALS — BP 116/82 | HR 129 | Ht 65.0 in | Wt 109.0 lb

## 2019-03-16 DIAGNOSIS — R1032 Left lower quadrant pain: Secondary | ICD-10-CM | POA: Diagnosis not present

## 2019-03-16 DIAGNOSIS — K769 Liver disease, unspecified: Secondary | ICD-10-CM | POA: Diagnosis not present

## 2019-03-16 DIAGNOSIS — M2550 Pain in unspecified joint: Secondary | ICD-10-CM

## 2019-03-16 DIAGNOSIS — R109 Unspecified abdominal pain: Secondary | ICD-10-CM | POA: Insufficient documentation

## 2019-03-16 LAB — CBC WITH DIFFERENTIAL/PLATELET
Basophils Absolute: 0.1 10*3/uL (ref 0.0–0.1)
Basophils Relative: 0.7 % (ref 0.0–3.0)
Eosinophils Absolute: 0.1 10*3/uL (ref 0.0–0.7)
Eosinophils Relative: 0.6 % (ref 0.0–5.0)
HCT: 46.8 % — ABNORMAL HIGH (ref 36.0–46.0)
Hemoglobin: 15.4 g/dL — ABNORMAL HIGH (ref 12.0–15.0)
Lymphocytes Relative: 10.7 % — ABNORMAL LOW (ref 12.0–46.0)
Lymphs Abs: 1.6 10*3/uL (ref 0.7–4.0)
MCHC: 32.9 g/dL (ref 30.0–36.0)
MCV: 98.7 fl (ref 78.0–100.0)
Monocytes Absolute: 1.2 10*3/uL — ABNORMAL HIGH (ref 0.1–1.0)
Monocytes Relative: 7.8 % (ref 3.0–12.0)
Neutro Abs: 12.3 10*3/uL — ABNORMAL HIGH (ref 1.4–7.7)
Neutrophils Relative %: 80.2 % — ABNORMAL HIGH (ref 43.0–77.0)
Platelets: 263 10*3/uL (ref 150.0–400.0)
RBC: 4.74 Mil/uL (ref 3.87–5.11)
RDW: 13.2 % (ref 11.5–15.5)
WBC: 15.4 10*3/uL — ABNORMAL HIGH (ref 4.0–10.5)

## 2019-03-16 LAB — COMPREHENSIVE METABOLIC PANEL
ALT: 12 U/L (ref 0–35)
AST: 19 U/L (ref 0–37)
Albumin: 3.6 g/dL (ref 3.5–5.2)
Alkaline Phosphatase: 96 U/L (ref 39–117)
BUN: 8 mg/dL (ref 6–23)
CO2: 30 mEq/L (ref 19–32)
Calcium: 9.2 mg/dL (ref 8.4–10.5)
Chloride: 96 mEq/L (ref 96–112)
Creatinine, Ser: 0.68 mg/dL (ref 0.40–1.20)
GFR: 87.4 mL/min (ref >60.00)
Glucose, Bld: 193 mg/dL — ABNORMAL HIGH (ref 70–99)
Potassium: 3.7 mEq/L (ref 3.5–5.1)
Sodium: 135 mEq/L (ref 135–145)
Total Bilirubin: 0.4 mg/dL (ref 0.2–1.2)
Total Protein: 6.9 g/dL (ref 6.0–8.3)

## 2019-03-16 LAB — C-REACTIVE PROTEIN: CRP: 14.8 mg/dL (ref 0.5–20.0)

## 2019-03-16 LAB — SEDIMENTATION RATE: Sed Rate: 50 mm/hr — ABNORMAL HIGH (ref 0–30)

## 2019-03-16 MED ORDER — DOXYCYCLINE HYCLATE 100 MG PO TABS: 100.0000 mg | ORAL_TABLET | Freq: Two times a day (BID) | ORAL | 0 refills | Status: AC

## 2019-03-16 MED ORDER — IOHEXOL 300 MG/ML  SOLN
100.0000 mL | Freq: Once | INTRAMUSCULAR | Status: AC | PRN
  Administered 2019-03-16: 100 mL via INTRAVENOUS

## 2019-03-16 MED ORDER — METRONIDAZOLE 500 MG PO TABS: 500.0000 mg | ORAL_TABLET | Freq: Two times a day (BID) | ORAL | 0 refills | Status: AC

## 2019-03-16 NOTE — Progress Notes (Signed)
[name] [address], [place] [address] Phone: [phone] Subjective:   [name], am serving as a scribe for Dr. Hulan Saas.  I'm seeing this patient by the request  of:    CC: Left abdominal pain  [id number]   03/08/2019 Acute onset of abdominal pain with swelling left side after coughing.  Concern for potential hernia, unable to see patient today and patient wanted to do the virtual call.  Unfortunately visual platform did not work.  Patient will have a limited abdominal ultrasound done to further evaluate.  Patient knows if nausea vomiting, increasing fever, increasing pain to seek medical attention immediately.  Patient is in agreement with the plan.   Update 03/16/2019 [name] is a 63 y.o. female coming in with complaint of left sided abdominal pain. Pain worse at night. Pain is dull. Pain throughout the day as well.  Patient continues to have swelling in the area.  Continues to not feel good and if anything seems to be worsening.  Patient states that it seems to be somewhat difficult to catch a deep breath as well.  US performed today.  IMPRESSION: IMPRESSION: 1. [name] evidence for solid or cystic mass or hernia within the left lower quadrant of the abdomen in the area of concern. 2. Echogenic liver lesions are noted within the right lobe of liver and favored to represent benign hemangiomas.      Past Medical History:  Diagnosis Date  . Basal cell carcinoma   . History of acute bronchitis 03/24/2011  . Lymphadenopathy, abdominal 03/2013   Noted on abd/pelv CT done to eval slow-to-resolve pyelo  . Meniere disorder 01/23/2012  . Osteoporosis 12/2014   Dr. Nori Riis, her GYN, dx'd her  . Recurrent pyelonephritis   . Seasonal allergic rhinitis   . Squamous cell carcinoma in situ (SCCIS) of skin of lower leg 05/04/2018   Dr. Janan Ridge, [facility]   Past Surgical History:  Procedure Laterality Date   . DEXA  12/2014   T score spine -2.4, hip -3.5 (Dr. Nori Riis, [facility].  [name] DILATION AND CURETTAGE OF UTERUS  2009  . SKIN LESION EXCISION Left 05/24/2018   SCC; Skin, Excision, Left Laeral leg: postsurgical scar, [name] neoplasm is identified.  Skin, Shave bx and ED&C, right anterior thigh-Superficially invasive squamous cell carcinoma, extending to the deep margin.   Social History   Socioeconomic History  . Marital status: Married    Spouse name: Not on file  . Number of children: Not on file  . Years of education: Not on file  . Highest education level: Not on file  Occupational History  . Occupation: housewife  Social Needs  . Financial resource strain: Not on file  . Food insecurity    Worry: Not on file    Inability: Not on file  . Transportation needs    Medical: Not on file    Non-medical: Not on file  Tobacco Use  . Smoking status: Current Every Day Smoker    Packs/day: 1.00    Years: 40.00    Pack years: 40.00    Types: Cigarettes    Start date: 06/01/1978  . Smokeless tobacco: Never Used  Substance and Sexual Activity  . Alcohol use: Yes    Alcohol/week: 7.0 standard drinks    Types: 7 Glasses of wine per week  . Drug use: [name]  . Sexual activity: Yes    Partners: Male  Lifestyle  .  Physical activity    Days per week: Not on file    Minutes per session: Not on file  . Stress: Not on file  Relationships  . Social Herbalist on phone: Not on file    Gets together: Not on file    Attends religious service: Not on file    Active member of club or organization: Not on file    Attends meetings of clubs or organizations: Not on file    Relationship status: Not on file  Other Topics Concern  . Not on file  Social History Narrative  . Not on file   [name] Known Allergies Family History  Problem Relation Age of Onset  . Hypertension Mother   . Glaucoma Mother   . Irritable bowel syndrome Mother   . Arthritis Mother   . Kidney disease  Mother   . Diabetes Mother   . Hypertension Father   . Heart disease Father        bypasses  . Cancer Father        sarcoma  . Cancer Maternal Grandmother        breast  . Heart disease Maternal Grandmother        CHF  . Stroke Maternal Grandmother        in his 52's  . Stroke Maternal Grandfather   . Other Paternal Grandmother        hardening of the arteries  . Heart attack Paternal Grandfather   . Colon cancer Paternal Aunt 80  . Stomach cancer Neg Hx     Current Outpatient Medications (Endocrine & Metabolic):  .  alendronate (FOSAMAX) 70 MG tablet, Take 1 tablet (70 mg total) by mouth every 7 (seven) days. Take with a full glass of water on an empty stomach.   Current Outpatient Medications (Respiratory):  .  albuterol (PROVENTIL) (2.5 MG/3ML) 0.083% nebulizer solution, Take 3 mLs (2.5 mg total) by nebulization every 6 (six) hours as needed for wheezing or shortness of breath. [name]  azelastine (ASTELIN) 0.1 % nasal spray, Place 1 spray into both nostrils 2 (two) times daily. Use in each nostril as directed .  montelukast (SINGULAIR) 10 MG tablet, Take 1 tablet (10 mg total) by mouth at bedtime. [name]  umeclidinium-vilanterol (ANORO ELLIPTA) 62.5-25 MCG/INH AEPB, Inhale 1 puff into the lungs daily. .  VENTOLIN HFA 108 (90 Base) MCG/ACT inhaler, TAKE 2 PUFFS BY MOUTH EVERY 6 HOURS AS NEEDED FOR WHEEZE OR SHORTNESS OF BREATH    Current Outpatient Medications (Other):  .  fluorouracil (EFUDEX) 5 % cream, APPLY1 APPLICATION ON THE SKIN AT BEDTIME FOR 4 WEEKS .  gabapentin (NEURONTIN) 100 MG capsule, TAKE 2 CAPSULES (200 MG TOTAL) BY MOUTH AT BEDTIME. .  hydrOXYzine (VISTARIL) 25 MG capsule, Take 1-2 capsules (25-50 mg total) by mouth every 8 (eight) hours as needed. .  Multiple Vitamin (MULTIVITAMIN) tablet, Take 1 tablet by mouth daily. [name]  Respiratory Therapy Supplies (FLUTTER) DEVI, Twice a day and prn as needed, may increase if feeling worse .  Spacer/Aero-Holding Chambers  (AEROCHAMBER MV) inhaler, Use as instructed .  tretinoin (RETIN-A) 0.025 % cream, TAKE 1 APPLICATION THIN LAYER TO FACE NIGHTLY .  VITAMIN D, CHOLECALCIFEROL, PO, Take 5,000 Units by mouth daily.  [name]  doxycycline (VIBRA-TABS) 100 MG tablet, Take 1 tablet (100 mg total) by mouth 2 (two) times daily for 14 days. .  metroNIDAZOLE (FLAGYL) 500 MG tablet, Take 1 tablet (500 mg total) by mouth 2 (two) times  daily for 7 days.    Past medical history, social, surgical and family history all reviewed in electronic medical record.  [name] pertanent information unless stated regarding to the chief complaint.   Review of Systems:  [name] headache, visual changes, nausea, vomiting, diarrhea, constipation, dizziness, abdominal pain, skin rash, fevers, chills, night sweats, weight loss, swollen lymph nodes,  chest pain, shortness of breath, mood changes.  Positive muscle aches, body aches  Objective  Blood pressure 116/82, pulse (!) 129, height 5\' 5"  (1.651 m), weight 109 lb (49.4 kg), SpO2 95 %.    General: [name] apparent distress alert and oriented x3 mood and affect normal, dressed appropriately.  HEENT: Pupils equal, extraocular movements intact  Respiratory: Patient's speak in full sentences and does not appear short of breath  Cardiovascular: [name] lower extremity edema, non tender, [name] erythema  Skin: Warm dry intact with [name] signs of infection or rash on extremities or on axial skeleton.  Abdomen: Soft patient does have inflammation on the low left side of the soft tissue.  Patient does have guarding noted of the stomach.  Mild involuntary guarding in the left lower quadrant.  Does have a fullness in this area but [name] masses appreciated. Neuro: Cranial nerves II through XII are intact, neurovascularly intact in all extremities with 2+ DTRs and 2+ pulses.  Lymph: [name] lymphadenopathy of posterior or anterior cervical chain or axillae bilaterally.  Gait normal with good balance and coordination.  MSK:  Non tender with  full range of motion and good stability and symmetric strength and tone of shoulders, elbows, wrist, hip, knee and ankles bilaterally.     Impression and Recommendations:     This case required medical decision making of moderate complexity. The above documentation has been reviewed and is accurate and complete Lyndal Pulley, DO       Note: This dictation was prepared with Dragon dictation along with smaller phrase technology. Any transcriptional errors that result from this process are unintentional.

## 2019-03-16 NOTE — Assessment & Plan Note (Signed)
Left lower quadrant.  Patient's vitals are stable.  Concern though for some type of infectious etiology and does have some inflammation noted of the stomach wall.  Does not appear to be more of a cellulitis though.  Concern for potential diverticulitis.  CT abdomen pelvis ordered today.  Patient denies any true shortness of breath.  Laboratory work-up for infectious etiology.  Discussed if worsening symptoms to seek medical attention immediately.  Patient's laboratories came in at a later time and did call patient and discuss again.  Laboratory work-up did show the patient did have an elevated white blood cell count with a left shift.  Started doxycycline and Flagyl.  Discussed once again that if worsening symptoms to go to the emergency room immediately.

## 2019-03-18 LAB — LACTIC ACID, PLASMA: LACTIC ACID: 2.9 mmol/L — ABNORMAL HIGH (ref 0.4–1.8)

## 2019-03-18 LAB — LACTATE DEHYDROGENASE: LDH: 210 U/L (ref 120–250)

## 2019-03-21 ENCOUNTER — Telehealth: Payer: Self-pay | Admitting: *Deleted

## 2019-03-21 NOTE — Telephone Encounter (Signed)
Pt left msg stating Dr. Tamala Julian sent 2 abx into the pharmacy & they are making her dry heave. She states that she doesn't have a fever & is still having pain in LLQ.

## 2019-03-22 ENCOUNTER — Telehealth: Payer: Self-pay | Admitting: Pulmonary Disease

## 2019-03-22 DIAGNOSIS — F17210 Nicotine dependence, cigarettes, uncomplicated: Secondary | ICD-10-CM

## 2019-03-22 DIAGNOSIS — Z122 Encounter for screening for malignant neoplasm of respiratory organs: Secondary | ICD-10-CM

## 2019-03-23 DIAGNOSIS — C44619 Basal cell carcinoma of skin of left upper limb, including shoulder: Secondary | ICD-10-CM | POA: Diagnosis not present

## 2019-03-23 NOTE — Telephone Encounter (Signed)
She needs a tele visit. Who is her primary pulmonary doc? Thanks

## 2019-03-23 NOTE — Telephone Encounter (Signed)
Patient is returning call for [facility]. CB is [phone]

## 2019-03-23 NOTE — Telephone Encounter (Signed)
Spoke with pt and scheduled SDMV 04/06/19 1:30 CT ordered  Pt states that she called in on 03/09/19 with increase in respiratory symptoms and was told to have a covid test which was negative on 03/10/19.  Pt still not feeling better . Pt seen Dr Hulan Saas on 03/16/19 for swelling and discomfort in LLQ. CT abd/pelvis showed an infection process in the lungs.  Dr Tamala Julian started pt on Doxycycline (finished today) and Flagyl ( has 7 more days) .  Pt states she is still not completely better. Denies fever but still has prod cough with yellow sputum ( was green initially), head congestion, runny nose.  Denies sob or loss of taste or smell.  Pt wants to know what else she needs to do. Pt is scheduled for lung screening CT on 04/06/19.  Please advise.

## 2019-03-23 NOTE — Telephone Encounter (Signed)
Reviewed her chart, it appears she is calling back for the lung cancer screening program. Will route message to that pool.

## 2019-03-23 NOTE — Telephone Encounter (Signed)
Attempted to call patient 3 times with no answer.  We may need to see her again to see how she is doing  If she is worsening I would also consider a CT chest

## 2019-03-23 NOTE — Telephone Encounter (Addendum)
Spoke with the pt  I offered a video or televisit and she refused  She states that she has a derm appt this afternoon and does not have time  I offered one for tomorrow and she still refused  She states her symptoms are very minimal and although she appreciates the offer, she feels appt is not needed  She will call back if symptoms persist or worsen Nothing further needed per pt

## 2019-03-24 ENCOUNTER — Encounter: Payer: Self-pay | Admitting: Family Medicine

## 2019-03-24 NOTE — Telephone Encounter (Signed)
Patient is scheduled for tomorrow at 1pm

## 2019-03-24 NOTE — Telephone Encounter (Signed)
Patient is doing somewhat better. Chest is better. Still having LLQ pain but not as bad as it was during her visit. No more dry heaving.

## 2019-03-24 NOTE — Telephone Encounter (Signed)
Still has swelling in LLQ.

## 2019-03-24 NOTE — Progress Notes (Signed)
[name] [address], [place] [address] Phone: [phone] Subjective:   [name], am serving as a scribe for Dr. Hulan Saas.   This visit occurred during the SARS-CoV-2 public health emergency.  Safety protocols were in place, including screening questions prior to the visit, additional usage of staff PPE, and extensive cleaning of exam room while observing appropriate contact time as indicated for disinfecting solutions.       CC: Left lower quadrant pain follow-up  [id number]  [name] is a 63 y.o. female coming in with complaint of LLQ. Is still using the doxy. Finished Flagl. Continues to have swelling in LLQ. Is not having any pain.  Patient states that she continues to have pain but it does seem to be improving to a certain degree, still has the swelling in the area.  Patient did have CT abdomen pelvis done that did not show any type of mass or true infectious etiology but patient did have an elevated lactic acid and white blood cell count.  Has been on doxycycline and Flagyl and states that it does seem to be improving.      Past Medical History:  Diagnosis Date  . Basal cell carcinoma   . History of acute bronchitis 03/24/2011  . Lymphadenopathy, abdominal 03/2013   Noted on abd/pelv CT done to eval slow-to-resolve pyelo  . Meniere disorder 01/23/2012  . Osteoporosis 12/2014   Dr. Nori Riis, her GYN, dx'd her  . Recurrent pyelonephritis   . Seasonal allergic rhinitis   . Squamous cell carcinoma in situ (SCCIS) of skin of lower leg 05/04/2018   Dr. Janan Ridge, [facility]   Past Surgical History:  Procedure Laterality Date  . DEXA  12/2014   T score spine -2.4, hip -3.5 (Dr. Nori Riis, [facility].  [name] DILATION AND CURETTAGE OF UTERUS  2009  . SKIN LESION EXCISION Left 05/24/2018   SCC; Skin, Excision, Left Laeral leg: postsurgical scar, no neoplasm is identified.  Skin, Shave bx and  ED&C, right anterior thigh-Superficially invasive squamous cell carcinoma, extending to the deep margin.   Social History   Socioeconomic History  . Marital status: Married    Spouse name: Not on file  . Number of children: Not on file  . Years of education: Not on file  . Highest education level: Not on file  Occupational History  . Occupation: housewife  Social Needs  . Financial resource strain: Not on file  . Food insecurity    Worry: Not on file    Inability: Not on file  . Transportation needs    Medical: Not on file    Non-medical: Not on file  Tobacco Use  . Smoking status: Current Every Day Smoker    Packs/day: 1.00    Years: 40.00    Pack years: 40.00    Types: Cigarettes    Start date: 06/01/1978  . Smokeless tobacco: Never Used  Substance and Sexual Activity  . Alcohol use: Yes    Alcohol/week: 7.0 standard drinks    Types: 7 Glasses of wine per week  . Drug use: No  . Sexual activity: Yes    Partners: Male  Lifestyle  . Physical activity    Days per week: Not on file    Minutes per session: Not on file  . Stress: Not on file  Relationships  . Social Herbalist on phone: Not on file    Gets  together: Not on file    Attends religious service: Not on file    Active member of club or organization: Not on file    Attends meetings of clubs or organizations: Not on file    Relationship status: Not on file  Other Topics Concern  . Not on file  Social History Narrative  . Not on file   No Known Allergies Family History  Problem Relation Age of Onset  . Hypertension Mother   . Glaucoma Mother   . Irritable bowel syndrome Mother   . Arthritis Mother   . Kidney disease Mother   . Diabetes Mother   . Hypertension Father   . Heart disease Father        bypasses  . Cancer Father        sarcoma  . Cancer Maternal Grandmother        breast  . Heart disease Maternal Grandmother        CHF  . Stroke Maternal Grandmother        in his 93's  .  Stroke Maternal Grandfather   . Other Paternal Grandmother        hardening of the arteries  . Heart attack Paternal Grandfather   . Colon cancer Paternal Aunt 34  . Stomach cancer Neg Hx     Current Outpatient Medications (Endocrine & Metabolic):  .  alendronate (FOSAMAX) 70 MG tablet, Take 1 tablet (70 mg total) by mouth every 7 (seven) days. Take with a full glass of water on an empty stomach.   Current Outpatient Medications (Respiratory):  .  albuterol (PROVENTIL) (2.5 MG/3ML) 0.083% nebulizer solution, Take 3 mLs (2.5 mg total) by nebulization every 6 (six) hours as needed for wheezing or shortness of breath. [name]  azelastine (ASTELIN) 0.1 % nasal spray, Place 1 spray into both nostrils 2 (two) times daily. Use in each nostril as directed .  montelukast (SINGULAIR) 10 MG tablet, Take 1 tablet (10 mg total) by mouth at bedtime. [name]  umeclidinium-vilanterol (ANORO ELLIPTA) 62.5-25 MCG/INH AEPB, Inhale 1 puff into the lungs daily. .  VENTOLIN HFA 108 (90 Base) MCG/ACT inhaler, TAKE 2 PUFFS BY MOUTH EVERY 6 HOURS AS NEEDED FOR WHEEZE OR SHORTNESS OF BREATH    Current Outpatient Medications (Other):  .  doxycycline (VIBRA-TABS) 100 MG tablet, Take 1 tablet (100 mg total) by mouth 2 (two) times daily for 14 days. .  fluorouracil (EFUDEX) 5 % cream, APPLY1 APPLICATION ON THE SKIN AT BEDTIME FOR 4 WEEKS .  gabapentin (NEURONTIN) 100 MG capsule, TAKE 2 CAPSULES (200 MG TOTAL) BY MOUTH AT BEDTIME. .  hydrOXYzine (VISTARIL) 25 MG capsule, Take 1-2 capsules (25-50 mg total) by mouth every 8 (eight) hours as needed. .  Multiple Vitamin (MULTIVITAMIN) tablet, Take 1 tablet by mouth daily. [name]  Respiratory Therapy Supplies (FLUTTER) DEVI, Twice a day and prn as needed, may increase if feeling worse .  Spacer/Aero-Holding Chambers (AEROCHAMBER MV) inhaler, Use as instructed .  tretinoin (RETIN-A) 0.025 % cream, TAKE 1 APPLICATION THIN LAYER TO FACE NIGHTLY .  VITAMIN D, CHOLECALCIFEROL, PO, Take 5,000  Units by mouth daily.     Past medical history, social, surgical and family history all reviewed in electronic medical record.  No pertanent information unless stated regarding to the chief complaint.   Review of Systems:  No headache, visual changes, nausea, vomiting, diarrhea, constipation, dizziness, abdominal pain, skin rash, fevers, chills, night sweats, weight loss, swollen lymph nodes, body aches, joint swelling, muscle aches, chest  pain, shortness of breath, mood changes.  Positive abdominal pain  Objective  Blood pressure 122/86, pulse (!) 107, height 5\' 5"  (1.651 m), weight 107 lb (48.5 kg), SpO2 95 %.    General: No apparent distress alert and oriented x3 mood and affect normal, dressed appropriately.  HEENT: Pupils equal, extraocular movements intact  Respiratory: Patient's speak in full sentences and does not appear short of breath  Cardiovascular: No lower extremity edema, non tender, no erythema  Skin: Warm dry intact with no signs of infection or rash on extremities or on axial skeleton.  Abdomen: Soft still has fullness in the left lower quadrant.  No mass appreciated.  Possible stool burden noted.  Voluntary guarding but no involuntary guarding which is an improvement still with Valsalva seems to have enlargement in the area. Neuro: Cranial nerves II through XII are intact, neurovascularly intact in all extremities with 2+ DTRs and 2+ pulses.  Lymph: No lymphadenopathy of posterior or anterior cervical chain or axillae bilaterally.  Gait normal with good balance and coordination.  MSK:  tender with full range of motion and good stability and symmetric strength and tone of shoulders, elbows, wrist, hip, knee and ankles bilaterally.     Impression and Recommendations:     This case required medical decision making of moderate complexity. The above documentation has been reviewed and is accurate and complete Lyndal Pulley, DO       Note: This dictation was prepared  with Dragon dictation along with smaller phrase technology. Any transcriptional errors that result from this process are unintentional.

## 2019-03-25 ENCOUNTER — Other Ambulatory Visit: Payer: Self-pay

## 2019-03-25 ENCOUNTER — Other Ambulatory Visit (INDEPENDENT_AMBULATORY_CARE_PROVIDER_SITE_OTHER): Payer: BC Managed Care – PPO

## 2019-03-25 ENCOUNTER — Ambulatory Visit (INDEPENDENT_AMBULATORY_CARE_PROVIDER_SITE_OTHER): Payer: BC Managed Care – PPO | Admitting: Family Medicine

## 2019-03-25 VITALS — BP 122/86 | HR 107 | Ht 65.0 in | Wt 107.0 lb

## 2019-03-25 DIAGNOSIS — R1032 Left lower quadrant pain: Secondary | ICD-10-CM

## 2019-03-25 DIAGNOSIS — M2550 Pain in unspecified joint: Secondary | ICD-10-CM

## 2019-03-25 LAB — COMPREHENSIVE METABOLIC PANEL
ALT: 19 U/L (ref 0–35)
AST: 33 U/L (ref 0–37)
Albumin: 4 g/dL (ref 3.5–5.2)
Alkaline Phosphatase: 65 U/L (ref 39–117)
BUN: 18 mg/dL (ref 6–23)
CO2: 29 mEq/L (ref 19–32)
Calcium: 9.6 mg/dL (ref 8.4–10.5)
Chloride: 101 mEq/L (ref 96–112)
Creatinine, Ser: 0.84 mg/dL (ref 0.40–1.20)
GFR: 68.48 mL/min (ref >60.00)
Glucose, Bld: 104 mg/dL — ABNORMAL HIGH (ref 70–99)
Potassium: 4.5 mEq/L (ref 3.5–5.1)
Sodium: 139 mEq/L (ref 135–145)
Total Bilirubin: 0.4 mg/dL (ref 0.2–1.2)
Total Protein: 7.2 g/dL (ref 6.0–8.3)

## 2019-03-25 LAB — CBC WITH DIFFERENTIAL/PLATELET
Basophils Absolute: 0.1 10*3/uL (ref 0.0–0.1)
Basophils Relative: 1.1 % (ref 0.0–3.0)
Eosinophils Absolute: 0.1 10*3/uL (ref 0.0–0.7)
Eosinophils Relative: 1.3 % (ref 0.0–5.0)
HCT: 50.7 % — ABNORMAL HIGH (ref 36.0–46.0)
Hemoglobin: 16.8 g/dL — ABNORMAL HIGH (ref 12.0–15.0)
Lymphocytes Relative: 24.6 % (ref 12.0–46.0)
Lymphs Abs: 2.7 10*3/uL (ref 0.7–4.0)
MCHC: 33.2 g/dL (ref 30.0–36.0)
MCV: 97.6 fl (ref 78.0–100.0)
Monocytes Absolute: 0.7 10*3/uL (ref 0.1–1.0)
Monocytes Relative: 6.4 % (ref 3.0–12.0)
Neutro Abs: 7.2 10*3/uL (ref 1.4–7.7)
Neutrophils Relative %: 66.6 % (ref 43.0–77.0)
Platelets: 292 10*3/uL (ref 150.0–400.0)
RBC: 5.19 Mil/uL — ABNORMAL HIGH (ref 3.87–5.11)
RDW: 13.4 % (ref 11.5–15.5)
WBC: 10.9 10*3/uL — ABNORMAL HIGH (ref 4.0–10.5)

## 2019-03-25 LAB — LIPASE: Lipase: 30 U/L (ref 11.0–59.0)

## 2019-03-25 LAB — GAMMA GT: GGT: 31 U/L (ref 7–51)

## 2019-03-25 NOTE — Patient Instructions (Addendum)
Good to see you Will contact you about labs

## 2019-03-27 ENCOUNTER — Encounter: Payer: Self-pay | Admitting: Family Medicine

## 2019-03-27 NOTE — Assessment & Plan Note (Signed)
Continues to have abdominal pain.  See Patient did have some type of infectious etiology and is responding well to the antibiotics.  We did discuss the possibility of a CT scan of the chest which patient declined at the moment.  States that she is not having shortness of breath but patient does have a history of the abnormal CT scan of the lungs and is a smoker.  Wants to continue with conservative therapy and we discussed a bowel regimen to help see if some of this could be secondary to severe constipation.  Patient has responded to the antibiotics will get laboratory work-up both to make sure white blood cell count has responded as well as lactic acid.  Patient is following up with primary care provider as well.  Discussed with patient to send message in 72 hours so I know patient is improving.  Spent  25 minutes with patient face-to-face and had greater than 50% of counseling including as described above in assessment and plan.

## 2019-03-28 LAB — CANCER ANTIGEN 19-9: CA 19-9: 14 U/mL (ref <34)

## 2019-03-28 LAB — CA 125: CA 125: 10 U/mL (ref <35)

## 2019-03-28 LAB — LACTIC ACID, PLASMA: LACTIC ACID: 0.9 mmol/L (ref 0.4–1.8)

## 2019-03-29 ENCOUNTER — Encounter: Payer: Self-pay | Admitting: Family Medicine

## 2019-04-04 ENCOUNTER — Encounter: Payer: Self-pay | Admitting: Family Medicine

## 2019-04-04 NOTE — Telephone Encounter (Signed)
Received the following message from patient:   "Hi [name], I have an appointment with you on Wednesday and then with [facility].  I have been in contact with someone who has co-vid.  I do not have any new symptoms of co-vid, but, I still have chest congestion from the cold I woke up with Oct. 30.  I did take antibiotics from [facility] for almost being septic, but the congestion hasn't really clear up.  The cd scan I had on Nov. 11 showed some pneumonia in right lower lobe."  [name], please advise. Thanks!

## 2019-04-05 NOTE — Telephone Encounter (Signed)
Might be related to flare up of her COPD/emphysema.  Can send script for prednisone 10 mg pill >> 2 pills daily for 3 days, 1 pill daily for 3 days, 1/2 pill daily for 4 days.

## 2019-04-05 NOTE — Telephone Encounter (Signed)
"  Hi [name], I have an appointment with you on Wednesday and then with [facility].  I have been in contact with someone who has co-vid.  I do not have any new symptoms of co-vid, but, I still have chest congestion from the cold I woke up with Oct. 30.  I did take antibiotics from [facility] for almost being septic, but the congestion hasn't really clear up.  The cd scan I had on Nov. 11 showed some pneumonia in right lower lobe."  Received message from patient. As requested from [name] I have cancelled her lung screening appointment and will call [facility] today to cancel her CT appt. I spoke with the patient. She stated she has not noticed a fever. Her chest congestion has been going on since October 30th. No new body aches. She finished her Doxycycline on 11/25 but hasn't noticed any improvements.  Slight increase in fatigue. She wanted to know what Dr. Halford Chessman would recommend for her.   Dr. Halford Chessman, please advise. Thanks!

## 2019-04-06 ENCOUNTER — Encounter: Payer: BC Managed Care – PPO | Admitting: Acute Care

## 2019-04-06 ENCOUNTER — Ambulatory Visit: Payer: BC Managed Care – PPO

## 2019-04-06 MED ORDER — PREDNISONE 10 MG PO TABS: ORAL_TABLET | ORAL | 0 refills | Status: DC

## 2019-04-18 ENCOUNTER — Other Ambulatory Visit: Payer: Self-pay

## 2019-04-18 ENCOUNTER — Encounter: Payer: Self-pay | Admitting: Physician Assistant

## 2019-04-18 ENCOUNTER — Encounter: Payer: Self-pay | Admitting: Family Medicine

## 2019-04-18 DIAGNOSIS — R109 Unspecified abdominal pain: Secondary | ICD-10-CM

## 2019-04-20 ENCOUNTER — Telehealth (INDEPENDENT_AMBULATORY_CARE_PROVIDER_SITE_OTHER): Payer: BC Managed Care – PPO | Admitting: Pulmonary Disease

## 2019-04-20 ENCOUNTER — Encounter: Payer: Self-pay | Admitting: Pulmonary Disease

## 2019-04-20 VITALS — Ht 65.0 in | Wt 107.0 lb

## 2019-04-20 DIAGNOSIS — J309 Allergic rhinitis, unspecified: Secondary | ICD-10-CM | POA: Diagnosis not present

## 2019-04-20 DIAGNOSIS — R091 Pleurisy: Secondary | ICD-10-CM | POA: Diagnosis not present

## 2019-04-20 DIAGNOSIS — J441 Chronic obstructive pulmonary disease with (acute) exacerbation: Secondary | ICD-10-CM

## 2019-04-20 DIAGNOSIS — Z720 Tobacco use: Secondary | ICD-10-CM

## 2019-04-20 DIAGNOSIS — D751 Secondary polycythemia: Secondary | ICD-10-CM

## 2019-04-20 MED ORDER — TRELEGY ELLIPTA 100-62.5-25 MCG/INH IN AEPB: 1.0000 | INHALATION_SPRAY | Freq: Every day | RESPIRATORY_TRACT | 5 refills | Status: DC

## 2019-04-20 MED ORDER — PREDNISONE 10 MG PO TABS: ORAL_TABLET | ORAL | 0 refills | Status: AC

## 2019-04-20 NOTE — Patient Instructions (Signed)
Prednisone 10 mg pill >> 3 pills daily for 2 days, 2 pills daily for 2 days, 1 pill daily for 2 days  Trelegy one puff daily, and rinse mouth after each use  Stop using anoro once you start using trelegy  Continue singulair 10 mg pill nightly  Follow up in 4 weeks

## 2019-04-20 NOTE — Progress Notes (Signed)
[facility]  Chief Complaint  Patient presents with  . Follow-up    Patient has a cough with yellow to green sputum. Patient has low grade fever of 99.6 and has taken advil. Patient also states she has body aches.    Constitutional:  Ht 5\' 5"  (1.651 m)   Wt 107 lb (48.5 kg)   BMI 17.81 kg/m   Past Medical History:  Pyelonephritis, Osteoporosis, Meniere disorder, Basal cell carcinoma  Brief Summary:  [name] is a 63 y.o. female smokerwithCOPD/emphysema.  Virtual Visit via Video Note  I connected with [name] on 04/20/19 at 11:15 AM EST by a video enabled telemedicine application and verified that I am speaking with the correct person using two identifiers.  Location: Patient: home Provider: medical office   I discussed the limitations of evaluation and management by telemedicine and the availability of in person appointments. The patient expressed understanding and agreed to proceed.  She was on doxycycline for GI symptoms in November.  Had cough, low grade fever, and pleuritic chest pain.  Treated with prednisone taper.  Felt better on prednisone.  Now having pleuritic chest pain again.  Also coughing more with clear to yellow sputum.  Has low grade temperature.  Not having skin rash, leg swelling, or hemoptysis.  Has started using albuterol more.  Still smoking cigarettes.   Physical Exam:  Alert, no accessory muscle use, speech clear, no stridor.   CBC Latest Ref Rng & Units 03/25/2019 03/16/2019 11/04/2018  WBC 4.0 - 10.5 K/uL 10.9(H) 15.4(H) 14.8(H)  Hemoglobin 12.0 - 15.0 g/dL 16.8(H) 15.4(H) 17.5(H)  Hematocrit 36.0 - 46.0 % 50.7(H) 46.8(H) 52.9(H)  Platelets 150.0 - 400.0 K/uL 292.0 263.0 134(L)    CMP Latest Ref Rng & Units 03/25/2019 03/16/2019 11/04/2018  Glucose 70 - 99 mg/dL 104(H) 193(H) 106(H)  BUN 6 - 23 mg/dL 18 8 10   Creatinine 0.40 - 1.20 mg/dL 0.84 0.68 0.65  Sodium 135 - 145 mEq/L 139 135 136   Potassium 3.5 - 5.1 mEq/L 4.5 3.7 3.7  Chloride 96 - 112 mEq/L 101 96 98  CO2 19 - 32 mEq/L 29 30 27   Calcium 8.4 - 10.5 mg/dL 9.6 9.2 8.9  Total Protein 6.0 - 8.3 g/dL 7.2 6.9 7.1  Total Bilirubin 0.2 - 1.2 mg/dL 0.4 0.4 1.2  Alkaline Phos 39 - 117 U/L 65 96 70  AST 0 - 37 U/L 33 19 34  ALT 0 - 35 U/L 19 12 21      Assessment/Plan:   Recurrent COPD exacerbation with pleuritic chest pain. - she recently finished course of ABx >> defer additional ABx for now - will give additional course of prednisone - if her symptoms persist, then she will need repeat CT chest and then reconsider whether she needs bronchoscopy with airway sampling for culture  COPD with chronic bronchitis and emphysema. - since she is having frequent exacerbations, will restart ICS and continue LAMA/LABA - change from anoro to trelegy - continue singulair - prn albuterol  Tobacco abuse. - encouraged her to keep up with smoking cessation efforts  Allergic rhinitis. - continue astelin, singulair  Polycythemia. - based on most recent overnight oximetry results, she was advised to start supplemental oxygen at night - she has been reluctant to consider home oxygen set up - will discuss again at her next follow up   She has requested that health information not be shared with her daughter without her prior approval.  Patient Instructions  Prednisone 10 mg pill >> 3 pills daily for 2 days, 2 pills daily for 2 days, 1 pill daily for 2 days  Trelegy one puff daily, and rinse mouth after each use  Stop using anoro once you start using trelegy  Continue singulair 10 mg pill nightly  Follow up in 4 weeks  I discussed the assessment and treatment plan with the patient. The patient was provided an opportunity to ask questions and all were answered. The patient agreed with the plan and demonstrated an understanding of the instructions.   The patient was advised to call back or seek an in-person evaluation if  the symptoms worsen or if the condition fails to improve as anticipated.  I provided 26 minutes of non-face-to-face time during this encounter.   Chesley Mires, MD [facility] Pager: [phone] 04/20/2019, 11:19 AM  Flow Sheet     Pulmonary tests:  PFT 12/15/08 FEV1 2.20(90%), FVC 3.68(113%), FEV1% 60, TLC 6.54(132%), DLCO 75%, no BD response Sputum culture 06/27/16 >> Moraxella catarrhalis PFT 08/25/16 >> FEV1 1.55 (59%), FEV1% 57, TLC 5.33 (103%), DLCO 52% A1AT 07/14/18 >> 166, MM  Sleep testing:  ONO with RA 07/29/18 >> test time 6 hrs 23 min.  Basal SpO2 86%, low SpO2 72%.  Spent 289.2 min with SpO2 < 88%.  Chest imaging:  CT chest 04/21/16 >> borderline LAN, nodules in lung bases with tree in bud CT chest 10/22/16 >> moderate centrilobular emphysema, 1.2 cm nodule LUL, tree in bud, RLL BTX CT chest 01/26/17 >> moderate centrilobular emphysema, mild b/l BTX with tree in bud, LUL nodule resolved, stable 7 mm RLL nodule since 2015 HRCT 12/01/18 >> atherosclerosis, apical scarring, centrilobular emphysema, nodularity in RLL, 6 mm nodule Rt major fissure  Medications:   Allergies as of 04/20/2019   No Known Allergies     Medication List       Accurate as of April 20, 2019 11:19 AM. If you have any questions, ask your nurse or doctor.        STOP taking these medications   Anoro Ellipta 62.5-25 MCG/INH Aepb Generic drug: umeclidinium-vilanterol Stopped by: Chesley Mires, MD   gabapentin 100 MG capsule Commonly known as: NEURONTIN Stopped by: Chesley Mires, MD   hydrOXYzine 25 MG capsule Commonly known as: Vistaril Stopped by: Chesley Mires, MD     TAKE these medications   AeroChamber MV inhaler Use as instructed   albuterol (2.5 MG/3ML) 0.083% nebulizer solution Commonly known as: PROVENTIL Take 3 mLs (2.5 mg total) by nebulization every 6 (six) hours as needed for wheezing or shortness of breath.   Ventolin HFA 108 (90 Base) MCG/ACT inhaler  Generic drug: albuterol TAKE 2 PUFFS BY MOUTH EVERY 6 HOURS AS NEEDED FOR WHEEZE OR SHORTNESS OF BREATH   alendronate 70 MG tablet Commonly known as: FOSAMAX Take 1 tablet (70 mg total) by mouth every 7 (seven) days. Take with a full glass of water on an empty stomach.   azelastine 0.1 % nasal spray Commonly known as: ASTELIN Place 1 spray into both nostrils 2 (two) times daily. Use in each nostril as directed   fluorouracil 5 % cream Commonly known as: EFUDEX APPLY1 APPLICATION ON THE SKIN AT BEDTIME FOR 4 WEEKS   Flutter Devi Twice a day and prn as needed, may increase if feeling worse   montelukast 10 MG tablet Commonly known as: SINGULAIR Take 1 tablet (10 mg total) by mouth at bedtime.   multivitamin tablet Take 1 tablet  by mouth daily.   predniSONE 10 MG tablet Commonly known as: DELTASONE Take 3 tablets (30 mg total) by mouth daily with breakfast for 2 days, THEN 2 tablets (20 mg total) daily with breakfast for 2 days, THEN 1 tablet (10 mg total) daily with breakfast for 2 days. Start taking on: April 20, 2019 What changed: See the new instructions. Changed by: Chesley Mires, MD   Trelegy Ellipta 100-62.5-25 MCG/INH Aepb Generic drug: Fluticasone-Umeclidin-Vilant Inhale 1 puff into the lungs daily. Started by: Chesley Mires, MD   tretinoin 0.025 % cream Commonly known as: RETIN-A TAKE 1 APPLICATION THIN LAYER TO FACE NIGHTLY   VITAMIN D (CHOLECALCIFEROL) PO Take 5,000 Units by mouth daily.       Past Surgical History:  She  has a past surgical history that includes Dilation and curettage of uterus (2009); DEXA (12/2014); and Skin lesion excision (Left, 05/24/2018).  Family History:  Her family history includes Arthritis in her mother; Cancer in her father and maternal grandmother; Colon cancer (age of onset: 74) in her paternal aunt; Diabetes in her mother; Glaucoma in her mother; Heart attack in her paternal grandfather; Heart disease in her father and  maternal grandmother; Hypertension in her father and mother; Irritable bowel syndrome in her mother; Kidney disease in her mother; Other in her paternal grandmother; Stroke in her maternal grandfather and maternal grandmother.  Social History:  She  reports that she has been smoking cigarettes. She started smoking about 40 years ago. She has a 40.00 pack-year smoking history. She has never used smokeless tobacco. She reports current alcohol use of about 7.0 standard drinks of alcohol per week. She reports that she does not use drugs.

## 2019-04-27 DIAGNOSIS — Z681 Body mass index (BMI) 19 or less, adult: Secondary | ICD-10-CM | POA: Diagnosis not present

## 2019-04-27 DIAGNOSIS — Z13820 Encounter for screening for osteoporosis: Secondary | ICD-10-CM | POA: Diagnosis not present

## 2019-04-27 DIAGNOSIS — Z01419 Encounter for gynecological examination (general) (routine) without abnormal findings: Secondary | ICD-10-CM | POA: Diagnosis not present

## 2019-05-03 ENCOUNTER — Other Ambulatory Visit: Payer: Self-pay | Admitting: Pulmonary Disease

## 2019-05-04 ENCOUNTER — Ambulatory Visit (INDEPENDENT_AMBULATORY_CARE_PROVIDER_SITE_OTHER): Payer: BC Managed Care – PPO | Admitting: Acute Care

## 2019-05-04 ENCOUNTER — Encounter: Payer: Self-pay | Admitting: Acute Care

## 2019-05-04 ENCOUNTER — Ambulatory Visit
Admission: RE | Admit: 2019-05-04 | Discharge: 2019-05-04 | Disposition: A | Discharge status: Home / Self Care | Payer: BC Managed Care – PPO | Source: Ambulatory Visit | Attending: Acute Care | Admitting: Acute Care

## 2019-05-04 ENCOUNTER — Other Ambulatory Visit: Payer: Self-pay

## 2019-05-04 VITALS — BP 140/70 | HR 104 | Temp 97.4°F | Ht 65.0 in | Wt 109.0 lb

## 2019-05-04 DIAGNOSIS — Z122 Encounter for screening for malignant neoplasm of respiratory organs: Secondary | ICD-10-CM

## 2019-05-04 DIAGNOSIS — F17210 Nicotine dependence, cigarettes, uncomplicated: Secondary | ICD-10-CM

## 2019-05-04 DIAGNOSIS — C44729 Squamous cell carcinoma of skin of left lower limb, including hip: Secondary | ICD-10-CM | POA: Diagnosis not present

## 2019-05-04 NOTE — Progress Notes (Signed)
Shared Decision Making Visit Lung Cancer Screening Program [phone])   Eligibility:  Age 63 y.o.  Pack Years Smoking History Calculation 40 pack year smoking history (# packs/per year x # years smoked)  Recent History of coughing up blood  no  Unexplained weight loss? no ( >Than 15 pounds within the last 6 months )  Prior History Lung / other cancer no (Diagnosis within the last 5 years already requiring surveillance chest CT Scans).  Smoking Status Current Smoker  Former Smokers: Years since quit: NA  Quit Date: NA  Visit Components:  Discussion included one or more decision making aids. yes  Discussion included risk/benefits of screening. yes  Discussion included potential follow up diagnostic testing for abnormal scans. yes  Discussion included meaning and risk of over diagnosis. yes  Discussion included meaning and risk of False Positives. yes  Discussion included meaning of total radiation exposure. yes  Counseling Included:  Importance of adherence to annual lung cancer LDCT screening. yes  Impact of comorbidities on ability to participate in the program. yes  Ability and willingness to under diagnostic treatment. yes  Smoking Cessation Counseling:  Current Smokers:   Discussed importance of smoking cessation. yes  Information about tobacco cessation classes and interventions provided to patient. yes  Patient provided with "ticket" for LDCT Scan. yes  Symptomatic Patient. no  Counseling  Diagnosis Code: Tobacco Use Z72.0  Asymptomatic Patient yes  Counseling (Intermediate counseling: > three minutes counseling) [id number]  Former Smokers:   Discussed the importance of maintaining cigarette abstinence. yes  Diagnosis Code: Personal History of Nicotine Dependence. [id number]  Information about tobacco cessation classes and interventions provided to patient. Yes  Patient provided with "ticket" for LDCT Scan. yes  Written Order for Lung Cancer  Screening with LDCT placed in Epic. Yes (CT Chest Lung Cancer Screening Low Dose W/O CM) [id number] Z12.2-Screening of respiratory organs Z87.891-Personal history of nicotine dependence  BP 140/70 (BP Location: Left Arm, Cuff Size: Normal)   Pulse (!) 104   Temp (!) 97.4 F (36.3 C) (Oral)   Ht 5\' 5"  (1.651 m)   Wt 109 lb (49.4 kg)   SpO2 96%   BMI 18.14 kg/m   I have spent 25 minutes of face to face time with [name] discussing the risks and benefits of lung cancer screening. We viewed a power point together that explained in detail the above noted topics. We paused at intervals to allow for questions to be asked and answered to ensure understanding.We discussed that the single most powerful action that she can take to decrease her risk of developing lung cancer is to quit smoking. We discussed whether or not she is ready to commit to setting a quit date. We discussed options for tools to aid in quitting smoking including nicotine replacement therapy, non-nicotine medications, support groups, Quit Smart classes, and behavior modification. We discussed that often times setting smaller, more achievable goals, such as eliminating 1 cigarette a day for a week and then 2 cigarettes a day for a week can be helpful in slowly decreasing the number of cigarettes smoked. This allows for a sense of accomplishment as well as providing a clinical benefit. I gave her the " Be Stronger Than Your Excuses" card with contact information for community resources, classes, free nicotine replacement therapy, and access to mobile apps, text messaging, and on-line smoking cessation help. I have also given her my card and contact information in the event she needs to contact me. We discussed the time  and location of the scan, and that either Kathleen Glassman RN or I will call with the results within 24-48 hours of receiving them. I have offered her  a copy of the power point we viewed  as a resource in the event they need  reinforcement of the concepts we discussed today in the office. The patient verbalized understanding of all of  the above and had no further questions upon leaving the office. They have my contact information in the event they have any further questions.  I spent 4 minutes counseling on smoking cessation and the health risks of continued tobacco abuse.  I explained to the patient that there has been a high incidence of coronary artery disease noted on these exams. I explained that this is a non-gated exam therefore degree or severity cannot be determined. This patient is not on statin therapy. I have asked the patient to follow-up with their PCP regarding any incidental finding of coronary artery disease and management with diet or medication as their PCP  feels is clinically indicated. The patient verbalized understanding of the above and had no further questions upon completion of the visit.  Magdalen Spatz, NP 05/04/2019 5:18 PM

## 2019-05-04 NOTE — Patient Instructions (Signed)
Thank you for participating in the [facility]. It was our pleasure to meet you today. We will call you with the results of your scan within the next few days. Your scan will be assigned a Lung RADS category score by the physicians reading the scans.  This Lung RADS score determines follow up scanning.  See below for description of categories, and follow up screening recommendations. We will be in touch to schedule your follow up screening annually or based on recommendations of our providers. We will fax a copy of your scan results to your Primary Care Physician, or the physician who referred you to the program, to ensure they have the results. Please call the office if you have any questions or concerns regarding your scanning experience or results.  Our office number is [phone]. Please speak with Denise Phelps, RN. She is our Lung Cancer Screening RN. If she is unavailable when you call, please have the office staff send her a message. She will return your call at her earliest convenience. Remember, if your scan is normal, we will scan you annually as long as you continue to meet the criteria for the program. (Age 55-77, Current smoker or smoker who has quit within the last 15 years). If you are a smoker, remember, quitting is the single most powerful action that you can take to decrease your risk of lung cancer and other pulmonary, breathing related problems. We know quitting is hard, and we are here to help.  Please let us know if there is anything we can do to help you meet your goal of quitting. If you are a former smoker, congratulations. We are proud of you! Remain smoke free! Remember you can refer friends or family members through the number above.  We will screen them to make sure they meet criteria for the program. Thank you for helping us take better care of you by participating in Lung Screening.  Lung RADS Categories:  Lung RADS 1: no nodules  or definitely non-concerning nodules.  Recommendation is for a repeat annual scan in 12 months.  Lung RADS 2:  nodules that are non-concerning in appearance and behavior with a very low likelihood of becoming an active cancer. Recommendation is for a repeat annual scan in 12 months.  Lung RADS 3: nodules that are probably non-concerning , includes nodules with a low likelihood of becoming an active cancer.  Recommendation is for a 6-month repeat screening scan. Often noted after an upper respiratory illness. We will be in touch to make sure you have no questions, and to schedule your 6-month scan.  Lung RADS 4 A: nodules with concerning findings, recommendation is most often for a follow up scan in 3 months or additional testing based on our provider's assessment of the scan. We will be in touch to make sure you have no questions and to schedule the recommended 3 month follow up scan.  Lung RADS 4 B:  indicates findings that are concerning. We will be in touch with you to schedule additional diagnostic testing based on our provider's  assessment of the scan.   

## 2019-05-08 ENCOUNTER — Other Ambulatory Visit: Payer: Self-pay | Admitting: Family Medicine

## 2019-05-09 ENCOUNTER — Encounter: Payer: Self-pay | Admitting: Physician Assistant

## 2019-05-09 ENCOUNTER — Other Ambulatory Visit: Payer: Self-pay

## 2019-05-09 ENCOUNTER — Other Ambulatory Visit: Payer: Self-pay | Admitting: Pulmonary Disease

## 2019-05-09 ENCOUNTER — Ambulatory Visit (INDEPENDENT_AMBULATORY_CARE_PROVIDER_SITE_OTHER): Payer: BC Managed Care – PPO | Admitting: Physician Assistant

## 2019-05-09 ENCOUNTER — Other Ambulatory Visit: Payer: Self-pay | Admitting: *Deleted

## 2019-05-09 VITALS — BP 126/70 | HR 104 | Temp 98.6°F | Ht 63.5 in | Wt 110.1 lb

## 2019-05-09 DIAGNOSIS — R1032 Left lower quadrant pain: Secondary | ICD-10-CM | POA: Diagnosis not present

## 2019-05-09 DIAGNOSIS — Z1159 Encounter for screening for other viral diseases: Secondary | ICD-10-CM

## 2019-05-09 DIAGNOSIS — F17210 Nicotine dependence, cigarettes, uncomplicated: Secondary | ICD-10-CM

## 2019-05-09 DIAGNOSIS — R194 Change in bowel habit: Secondary | ICD-10-CM | POA: Diagnosis not present

## 2019-05-09 NOTE — Progress Notes (Signed)
Chief Complaint: Left lower quadrant pain  HPI:    [name] is a 64 year old female with a past medical history as listed below including polycythemia and COPD, known to Dr. Carlean Purl, who was referred to me by Lyndal Pulley, DO for a complaint of left lower quadrant pain.      05/29/2011 colonoscopy for screening with pedunculated polyp in the sigmoid colon, internal and external hemorrhoids and otherwise normal.  Polyp was benign and repeat was recommended in 10 years.    03/16/2019 ultrasound of the pelvis with no evidence for solid or cystic mass or hernia within the left lower quadrant of the abdomen the area of concern.  Echogenic liver lesions were noted within the right lobe of the liver and favored to represent benign hemangiomas.    03/16/2019 CT abdomen pelvis with contrast with no acute findings in the abdomen or pelvis, no findings to explain patient's history of left lower quadrant pain, specifically no evidence for ventral hernia other than a tiny umbilical hernia containing only fat.  For bowel obstruction.  Peripheral confluent airspace disease in both lung bases.  Follow-up CT chest showed emphysema and benign appearance of lungs.    03/25/2019 CBC with a white count of 10.9, minimally elevated, hemoglobin 16.8.  CMP normal.  Lipase normal.    Today, the patient presents to clinic and explains that on October 30 she woke up and felt like she had a lump on the left lower side of her abdomen, she also started with a head cold that day and some pain rated as a 7-8/10.  She was started on Doxycycline and Flagyl and the pain went away but the swelling did not.  Patient then developed a fever a few days later and was Covid tested due to her upper respiratory symptoms and this came back negative.  She then had imaging as above and then saw Dr. Tamala Julian in his office where he did another ultrasound and said that she had a calcified "poop" in there.  She was told to do a MiraLAX prep and tells  me this cleaned her out completely but she continued with what she feels as though is a swelling just under the skin on the left lower side which comes over to her mid abdomen slightly.  She has also noticed a change in caliber of her stool telling me that they are narrower and skinnier than before.  Does tell me sometimes if she presses down in her left lower quadrant, it will still hurt.  Denies any other symptoms.    Denies continued fever, chills, weight loss, blood in her stool or symptoms that awaken her from sleep.  Past Medical History:  Diagnosis Date  . Basal cell carcinoma   . History of acute bronchitis 03/24/2011  . Lymphadenopathy, abdominal 03/2013   Noted on abd/pelv CT done to eval slow-to-resolve pyelo  . Meniere disorder 01/23/2012  . Osteoporosis 12/2014   Dr. Nori Riis, her GYN, dx'd her  . Recurrent pyelonephritis   . Seasonal allergic rhinitis   . Squamous cell carcinoma in situ (SCCIS) of skin of lower leg 05/04/2018   Dr. Janan Ridge, [facility]    Past Surgical History:  Procedure Laterality Date  . DEXA  12/2014   T score spine -2.4, hip -3.5 (Dr. Nori Riis, [facility].  [name] DILATION AND CURETTAGE OF UTERUS  2009  . SKIN LESION EXCISION Left 05/24/2018   [facility]; Skin, Excision, Left La[place] leg: postsurgical scar, no  neoplasm is identified.  Skin, Shave bx and ED&C, right anterior thigh-Superficially invasive squamous cell carcinoma, extending to the deep margin.    Current Outpatient Medications  Medication Sig Dispense Refill  . albuterol (PROVENTIL) (2.5 MG/3ML) 0.083% nebulizer solution Take 3 mLs (2.5 mg total) by nebulization every 6 (six) hours as needed for wheezing or shortness of breath. 75 mL 1  . alendronate (FOSAMAX) 70 MG tablet Take 1 tablet (70 mg total) by mouth every 7 (seven) days. Take with a full glass of water on an empty stomach. 4 tablet 11  . azelastine (ASTELIN) 0.1 % nasal spray Place 1 spray into both nostrils 2  (two) times daily. Use in each nostril as directed 30 mL 5  . fluorouracil (EFUDEX) 5 % cream APPLY1 APPLICATION ON THE SKIN AT BEDTIME FOR 4 WEEKS  3  . Fluticasone-Umeclidin-Vilant (TRELEGY ELLIPTA) 100-62.5-25 MCG/INH AEPB Inhale 1 puff into the lungs daily. 28 each 5  . gabapentin (NEURONTIN) 100 MG capsule TAKE 2 CAPSULES (200 MG TOTAL) BY MOUTH AT BEDTIME. 180 capsule 1  . montelukast (SINGULAIR) 10 MG tablet Take 1 tablet (10 mg total) by mouth at bedtime. 30 tablet 0  . Multiple Vitamin (MULTIVITAMIN) tablet Take 1 tablet by mouth daily.    [name] Respiratory Therapy Supplies (FLUTTER) DEVI Twice a day and prn as needed, may increase if feeling worse 1 each 0  . Spacer/Aero-Holding Chambers (AEROCHAMBER MV) inhaler Use as instructed 1 each 2  . tretinoin (RETIN-A) 0.05 % cream APPLY TO AFFECTED AREA EVERY DAY AT BEDTIME **PA SENT**    . VENTOLIN HFA 108 (90 Base) MCG/ACT inhaler TAKE 2 PUFFS BY MOUTH EVERY 6 HOURS AS NEEDED FOR WHEEZE OR SHORTNESS OF BREATH 18 g 3  . VITAMIN D, CHOLECALCIFEROL, PO Take 5,000 Units by mouth daily.      No current facility-administered medications for this visit.    Allergies as of 05/09/2019  . (No Known Allergies)    Family History  Problem Relation Age of Onset  . Hypertension Mother   . Glaucoma Mother   . Irritable bowel syndrome Mother   . Arthritis Mother   . Kidney disease Mother   . Diabetes Mother   . Hypertension Father   . Heart disease Father        bypasses  . Cancer Father        sarcoma  . Cancer Maternal Grandmother        breast  . Heart disease Maternal Grandmother        CHF  . Stroke Maternal Grandmother        in his 39's  . Stroke Maternal Grandfather   . Other Paternal Grandmother        hardening of the arteries  . Heart attack Paternal Grandfather   . Colon cancer Paternal Aunt 37  . Stomach cancer Neg Hx     Social History   Socioeconomic History  . Marital status: Married    Spouse name: Not on file  .  Number of children: Not on file  . Years of education: Not on file  . Highest education level: Not on file  Occupational History  . Occupation: housewife  Tobacco Use  . Smoking status: Current Every Day Smoker    Packs/day: 1.00    Years: 40.00    Pack years: 40.00    Types: Cigarettes    Start date: 06/01/1978  . Smokeless tobacco: Never Used  Substance and Sexual Activity  . Alcohol use:  Yes    Alcohol/week: 7.0 standard drinks    Types: 7 Glasses of wine per week  . Drug use: No  . Sexual activity: Yes    Partners: Male  Other Topics Concern  . Not on file  Social History Narrative  . Not on file   Social Determinants of Health   Financial Resource Strain:   . Difficulty of Paying Living Expenses: Not on file  Food Insecurity:   . Worried About Charity fundraiser in the Last Year: Not on file  . Ran Out of Food in the Last Year: Not on file  Transportation Needs:   . Lack of Transportation (Medical): Not on file  . Lack of Transportation (Non-Medical): Not on file  Physical Activity:   . Days of Exercise per Week: Not on file  . Minutes of Exercise per Session: Not on file  Stress:   . Feeling of Stress : Not on file  Social Connections:   . Frequency of Communication with Friends and Family: Not on file  . Frequency of Social Gatherings with Friends and Family: Not on file  . Attends Religious Services: Not on file  . Active Member of Clubs or Organizations: Not on file  . Attends Archivist Meetings: Not on file  . Marital Status: Not on file  Intimate Partner Violence:   . Fear of Current or Ex-Partner: Not on file  . Emotionally Abused: Not on file  . Physically Abused: Not on file  . Sexually Abused: Not on file    Review of Systems:    Constitutional: No weight loss Skin: No rash  Cardiovascular: No chest pain Respiratory: No SOB  Gastrointestinal: See HPI and otherwise negative Genitourinary: No dysuria  Neurological: No  headache Musculoskeletal: No new muscle or joint pain Hematologic: No bleeding  Psychiatric: No history of depression or anxiety   Physical Exam:  Vital signs: BP 126/70 (BP Location: Left Arm, Patient Position: Sitting, Cuff Size: Normal)   Pulse (!) 104 Comment: irregular  Temp 98.6 F (37 C)   Ht 5' 3.5" (1.613 m) Comment: height measured without shoes  Wt 110 lb 2 oz (50 kg)   BMI 19.20 kg/m   Constitutional:   Pleasant Caucasian female appears to be in NAD, Well developed, Well nourished, alert and cooperative Head:  Normocephalic and atraumatic. Eyes:   PEERL, EOMI. No icterus. Conjunctiva pink. Ears:  Normal auditory acuity. Neck:  Supple Throat: Oral cavity and pharynx without inflammation, swelling or lesion.  Respiratory: Respirations even and unlabored. Lungs clear to auscultation bilaterally.   No wheezes, crackles, or rhonchi.  Cardiovascular: Normal S1, S2. No MRG. Regular rate and rhythm. No peripheral edema, cyanosis or pallor.  Gastrointestinal:  Soft, nondistended, mild LLQ ttp, No rebound or guarding. Normal bowel sounds. No appreciable hepatomegaly.+mild swelling better seen when patient is standing over LLQ  Rectal:  Not performed.  Msk:  Symmetrical without gross deformities. Without edema, no deformity or joint abnormality.  Neurologic:  Alert and  oriented x4;  grossly normal neurologically.  Skin:   Dry and intact without significant lesions or rashes. Psychiatric: Demonstrates good judgement and reason without abnormal affect or behaviors.  RELEVANT LABS AND IMAGING: CBC    Component Value Date/Time   WBC 10.9 (H) 03/25/2019 1345   RBC 5.19 (H) 03/25/2019 1345   HGB 16.8 (H) 03/25/2019 1345   HGB 14.6 06/01/2013 1107   HCT 50.7 (H) 03/25/2019 1345   HCT 44.9 06/01/2013 1107  PLT 292.0 03/25/2019 1345   PLT 199 06/01/2013 1107   MCV 97.6 03/25/2019 1345   MCV 93 06/01/2013 1107   MCH 32.9 11/04/2018 1319   MCHC 33.2 03/25/2019 1345   RDW 13.4  03/25/2019 1345   RDW 13.6 06/01/2013 1107   LYMPHSABS 2.7 03/25/2019 1345   LYMPHSABS 2.5 06/01/2013 1107   MONOABS 0.7 03/25/2019 1345   EOSABS 0.1 03/25/2019 1345   EOSABS 0.0 06/01/2013 1107   BASOSABS 0.1 03/25/2019 1345   BASOSABS 0.0 06/01/2013 1107    CMP     Component Value Date/Time   NA 139 03/25/2019 1345   K 4.5 03/25/2019 1345   CL 101 03/25/2019 1345   CO2 29 03/25/2019 1345   GLUCOSE 104 (H) 03/25/2019 1345   BUN 18 03/25/2019 1345   CREATININE 0.84 03/25/2019 1345   CREATININE 0.72 03/18/2013 1212   CALCIUM 9.6 03/25/2019 1345   PROT 7.2 03/25/2019 1345   ALBUMIN 4.0 03/25/2019 1345   AST 33 03/25/2019 1345   ALT 19 03/25/2019 1345   ALKPHOS 65 03/25/2019 1345   BILITOT 0.4 03/25/2019 1345   GFRNONAA >60 11/04/2018 1319   GFRAA >60 11/04/2018 1319    Assessment: 1.  Change in bowel habits: Smaller caliber of stool; consider hemorrhoids versus polyp versus other 2.  Left lower quadrant pain: Started in October, initially treated with Doxycycline and Flagyl, pain got better but continues with swelling, CT and ultrasound of this area were unrevealing, repeat ultrasound with "calcified stool" per patient, pain now better but swelling continues; consider polyp versus other  Plan: 1.  Scheduled patient for a diagnostic colonoscopy given change in bowel habits and left lower quadrant pain with Dr. Carlean Purl in [facility].  Did discuss risks, benefits, limitations and alternatives and the patient agrees to proceed.  She will be Covid tested 2 days prior to exam. 2.  Patient to follow in clinic per recommendations from Dr. Carlean Purl after time of procedure.  Ellouise Newer, PA-C [facility] 05/09/2019, 8:26 AM  Cc: Lyndal Pulley, DO

## 2019-05-09 NOTE — Patient Instructions (Addendum)
If you are age 64 or older, your body mass index should be between 23-30. Your Body mass index is 19.2 kg/m. If this is out of the aforementioned range listed, please consider follow up with your Primary Care Provider.  If you are age 60 or younger, your body mass index should be between 19-25. Your Body mass index is 19.2 kg/m. If this is out of the aformentioned range listed, please consider follow up with your Primary Care Provider.   You have been scheduled for a colonoscopy. Please follow written instructions given to you at your visit today.  Please pick up your prep supplies at the pharmacy within the next 1-3 days. If you use inhalers (even only as needed), please bring them with you on the day of your procedure.  Due to recent COVID-19 restrictions implemented by [employer] and state authorities and in an effort to keep both patients and staff as safe as possible, [place] requires COVID-19 testing prior to any scheduled endoscopic procedure. The testing center is located at [address] [place], [place] [address] in the [facility] [employer]  suite.  Your appointment has been scheduled for 05/13/2019 at 8:30 AM.   Please bring your insurance cards to this appointment. You will require your COVID screen 2 business days prior to your endoscopic procedure.  You are not required to quarantine after your screening.  You will only receive a phone call with the results if it is POSITIVE.  If you do not receive a call the day before your procedure you should begin your prep, if ordered, and you should report to the endo center for your procedure at your designated appointment arrival time ( one hour prior to the procedure time). There is no cost to you for the screening on the day of the swab.  [facility] will file with your insurance company for the testing.    You may receive an automated phone call prior to your procedure or  have a message in your MyChart that you have an appointment for a BP/15 at the [facility], please disregard this message.  Your testing will be at the [facility] [place] location.   If you are leaving [facility] travel [place] on [place], turn left onto [address], turn night onto [address] at the 1st stop light turn right, pass the [employer] on your right and proceed to [address] (white building).   Due to recent changes in healthcare laws, you may see the results of your imaging and laboratory studies on MyChart before your provider has had a chance to review them.  We understand that in some cases there may be results that are confusing or concerning to you. Not all laboratory results come back in the same time frame and the provider may be waiting for multiple results in order to interpret others.  Please give [place] 48 hours in order for your provider to thoroughly review all the results before contacting the office for clarification of your results. .  Thank you for choosing me and [facility]

## 2019-05-09 NOTE — Addendum Note (Signed)
Addended by: Lanny Hurst A on: 05/09/2019 09:18 AM   Modules accepted: Orders

## 2019-05-12 ENCOUNTER — Encounter: Payer: Self-pay | Admitting: Internal Medicine

## 2019-05-13 ENCOUNTER — Ambulatory Visit (INDEPENDENT_AMBULATORY_CARE_PROVIDER_SITE_OTHER): Payer: BC Managed Care – PPO

## 2019-05-13 DIAGNOSIS — Z1159 Encounter for screening for other viral diseases: Secondary | ICD-10-CM

## 2019-05-13 LAB — SARS CORONAVIRUS 2 (TAT 6-24 HRS): SARS Coronavirus 2: NEGATIVE

## 2019-05-17 ENCOUNTER — Ambulatory Visit (AMBULATORY_SURGERY_CENTER): Payer: BC Managed Care – PPO | Admitting: Internal Medicine

## 2019-05-17 ENCOUNTER — Encounter: Payer: Self-pay | Admitting: Internal Medicine

## 2019-05-17 ENCOUNTER — Other Ambulatory Visit: Payer: Self-pay

## 2019-05-17 VITALS — BP 124/80 | HR 80 | Temp 98.0°F | Resp 13 | Ht 63.0 in | Wt 110.0 lb

## 2019-05-17 DIAGNOSIS — R194 Change in bowel habit: Secondary | ICD-10-CM | POA: Diagnosis not present

## 2019-05-17 DIAGNOSIS — K5730 Diverticulosis of large intestine without perforation or abscess without bleeding: Secondary | ICD-10-CM

## 2019-05-17 DIAGNOSIS — R195 Other fecal abnormalities: Secondary | ICD-10-CM | POA: Diagnosis not present

## 2019-05-17 MED ORDER — SODIUM CHLORIDE 0.9 % IV SOLN: 500.0000 mL | Freq: Once | INTRAVENOUS | Status: DC

## 2019-05-17 NOTE — Op Note (Signed)
[facility] Patient Name: [name] Procedure Date: 05/17/2019 12:02 PM MRN: [id number] Endoscopist: Gatha Mayer , MD Age: 64 Referring MD:  Date of Birth: October 28, 1955 Gender: Female Account #: [id number] Procedure:                Colonoscopy Indications:              Change in bowel habits, Change in stool caliber Medicines:                Propofol per Anesthesia, Monitored Anesthesia Care Procedure:                Pre-Anesthesia Assessment:                           - Prior to the procedure, a History and Physical                            was performed, and patient medications and                            allergies were reviewed. The patient's tolerance of                            previous anesthesia was also reviewed. The risks                            and benefits of the procedure and the sedation                            options and risks were discussed with the patient.                            All questions were answered, and informed consent                            was obtained. Prior Anticoagulants: The patient has                            taken no previous anticoagulant or antiplatelet                            agents. ASA Grade Assessment: II - A patient with                            mild systemic disease. After reviewing the risks                            and benefits, the patient was deemed in                            satisfactory condition to undergo the procedure.                           After obtaining informed consent, the colonoscope  was passed under direct vision. Throughout the                            procedure, the patient's blood pressure, pulse, and                            oxygen saturations were monitored continuously. The                            Colonoscope was introduced through the anus and                            advanced to the the cecum, identified by      appendiceal orifice and ileocecal valve. The                            colonoscopy was performed without difficulty. The                            patient tolerated the procedure well. The quality                            of the bowel preparation was good. The ileocecal                            valve, appendiceal orifice, and rectum were                            photographed. The bowel preparation used was                            Miralax via split dose instruction. Scope In: 12:11:07 PM Scope Out: 12:26:08 PM Scope Withdrawal Time: 0 hours 10 minutes 22 seconds  Total Procedure Duration: 0 hours 15 minutes 1 second  Findings:                 The perianal and digital rectal examinations were                            normal.                           Multiple diverticula were found in the sigmoid                            colon.                           The exam was otherwise without abnormality on                            direct and retroflexion views. Complications:            No immediate complications. Estimated Blood Loss:     Estimated blood loss: none. Impression:               -  Moderate diverticulosis in the sigmoid colon.                           - The examination was otherwise normal on direct                            and retroflexion views.                           - No specimens collected. Recommendation:           - Patient has a contact number available for                            emergencies. The signs and symptoms of potential                            delayed complications were discussed with the                            patient. Return to normal activities tomorrow.                            Written discharge instructions were provided to the                            patient.                           - Resume previous diet.                           - Continue present medications.                           - Repeat colonoscopy in 10  years for screening                            purposes.                           - HIGH FIBER DIET                           PLENTY OF FLUIDS                           CONSIDER ABDOMINAL WALL STRENGTHENING EXERCISES IF                            OK PER DR. ZACH SMITH                           BRACE ABDOMINAL WALL WHEN COUGHING                           SHE IS THIN AND WHEN LEFT COLON FILLS WITH STOOL IT  BULGES SOME - HAS THIN ABDOMINAL WALL ALSO                           NO HERNIA AS PROVEN BY CT Gatha Mayer, MD 05/17/2019 12:42:25 PM This report has been signed electronically.

## 2019-05-17 NOTE — Progress Notes (Signed)
Pt's states no medical or surgical changes since previsit or office visit.  Vitals- Courtney Temp- June 

## 2019-05-17 NOTE — Patient Instructions (Addendum)
I saw some diverticulosis.  I reviewed your CT and other notes - I think the bulge is because you are thin and have a thin abdominal wall. Perhaps strengthening your muscles witrh exercises would help that.  The coughing problems you have also contribute to the bulge and discomfort by increasing abdominal pressure.  Making sure you have plenty of fiber and fluid ion your diet wil be important to keep the bowels from filling with full and creating the bulge.  The good news is this is not something that will cause health problems.  I appreciate the opportunity to care for you. Gatha Mayer, MD, FACG YOU HAD AN ENDOSCOPIC PROCEDURE TODAY AT [facility]:   Refer to the procedure report that was given to you for any specific questions about what was found during the examination.  If the procedure report does not answer your questions, please call your gastroenterologist to clarify.  If you requested that your care partner not be given the details of your procedure findings, then the procedure report has been included in a sealed envelope for you to review at your convenience later.  YOU SHOULD EXPECT: Some feelings of bloating in the abdomen. Passage of more gas than usual.  Walking can help get rid of the air that was put into your GI tract during the procedure and reduce the bloating. If you had a lower endoscopy (such as a colonoscopy or flexible sigmoidoscopy) you may notice spotting of blood in your stool or on the toilet paper. If you underwent a bowel prep for your procedure, you may not have a normal bowel movement for a few days.  Please Note:  You might notice some irritation and congestion in your nose or some drainage.  This is from the oxygen used during your procedure.  There is no need for concern and it should clear up in a day or so.  SYMPTOMS TO REPORT IMMEDIATELY:   Following lower endoscopy (colonoscopy or flexible sigmoidoscopy):  Excessive amounts of blood  in the stool  Significant tenderness or worsening of abdominal pains  Swelling of the abdomen that is new, acute  Fever of 100F or higher     For urgent or emergent issues, a gastroenterologist can be reached at any hour by calling [phone].   DIET:  We do recommend a small meal at first, but then you may proceed to your regular diet.  Drink plenty of fluids but you should avoid alcoholic beverages for 24 hours.  ACTIVITY:  You should plan to take it easy for the rest of today and you should NOT DRIVE or use heavy machinery until tomorrow (because of the sedation medicines used during the test).    FOLLOW UP: Our staff will call the number listed on your records 48-72 hours following your procedure to check on you and address any questions or concerns that you may have regarding the information given to you following your procedure. If we do not reach you, we will leave a message.  We will attempt to reach you two times.  During this call, we will ask if you have developed any symptoms of COVID 19. If you develop any symptoms (ie: fever, flu-like symptoms, shortness of breath, cough etc.) before then, please call [phone].  If you test positive for Covid 19 in the 2 weeks post procedure, please call and report this information to [place].    If any biopsies were taken you will be contacted by phone or  by letter within the next 1-3 weeks.  Please call us at [phone] if you have not heard about the biopsies in 3 weeks.    SIGNATURES/CONFIDENTIALITY: You and/or your care partner have signed paperwork which will be entered into your electronic medical record.  These signatures attest to the fact that that the information above on your After Visit Summary has been reviewed and is understood.  Full responsibility of the confidentiality of this discharge information lies with you and/or your care-partner.

## 2019-05-17 NOTE — Progress Notes (Signed)
Report to PACU, RN, vss, BBS= Clear.  

## 2019-05-19 ENCOUNTER — Telehealth: Payer: Self-pay

## 2019-05-19 NOTE — Telephone Encounter (Signed)
Second follow up phone call attempt, no answer, LM 

## 2019-05-19 NOTE — Telephone Encounter (Signed)
Left message on follow up call. 

## 2019-05-23 ENCOUNTER — Other Ambulatory Visit: Payer: Self-pay | Admitting: Pulmonary Disease

## 2019-05-24 ENCOUNTER — Telehealth: Payer: Self-pay | Admitting: Pulmonary Disease

## 2019-05-24 ENCOUNTER — Other Ambulatory Visit: Payer: Self-pay

## 2019-05-24 ENCOUNTER — Ambulatory Visit (INDEPENDENT_AMBULATORY_CARE_PROVIDER_SITE_OTHER): Payer: BC Managed Care – PPO | Admitting: Pulmonary Disease

## 2019-05-24 ENCOUNTER — Encounter: Payer: Self-pay | Admitting: Pulmonary Disease

## 2019-05-24 VITALS — BP 120/68 | HR 60 | Temp 98.0°F | Ht 65.0 in | Wt 110.4 lb

## 2019-05-24 DIAGNOSIS — J479 Bronchiectasis, uncomplicated: Secondary | ICD-10-CM

## 2019-05-24 DIAGNOSIS — F17210 Nicotine dependence, cigarettes, uncomplicated: Secondary | ICD-10-CM

## 2019-05-24 DIAGNOSIS — D751 Secondary polycythemia: Secondary | ICD-10-CM

## 2019-05-24 DIAGNOSIS — J432 Centrilobular emphysema: Secondary | ICD-10-CM

## 2019-05-24 DIAGNOSIS — D582 Other hemoglobinopathies: Secondary | ICD-10-CM

## 2019-05-24 LAB — CBC
HCT: 47.9 % — ABNORMAL HIGH (ref 36.0–46.0)
Hemoglobin: 15.8 g/dL — ABNORMAL HIGH (ref 12.0–15.0)
MCHC: 33.1 g/dL (ref 30.0–36.0)
MCV: 99.3 fl (ref 78.0–100.0)
Platelets: 198 10*3/uL (ref 150.0–400.0)
RBC: 4.82 Mil/uL (ref 3.87–5.11)
RDW: 14.5 % (ref 11.5–15.5)
WBC: 8.9 10*3/uL (ref 4.0–10.5)

## 2019-05-24 NOTE — Telephone Encounter (Signed)
Left detailed message based on DPR notation regarding results and recommended test to be ordered. Requested call back if there are any additional questions.  Order placed, nothing further needed at this time.

## 2019-05-24 NOTE — Progress Notes (Signed)
[facility]  Chief Complaint  Patient presents with  . Follow-up    Centrilobular emphysema    Constitutional:  BP 120/68 (BP Location: Left Arm, Patient Position: Sitting, Cuff Size: Normal)   Pulse 60   Temp 98 F (36.7 C)   Ht 5\' 5"  (1.651 m)   Wt 110 lb 6.4 oz (50.1 kg)   SpO2 96% Comment: on room air  BMI 18.37 kg/m   Past Medical History:  Pyelonephritis, Osteoporosis, Meniere disorder, Basal cell carcinoma  Brief Summary:  [name] is a 64 y.o. female smokerwithCOPD/emphysema.  She was treated with antibiotics and prednisone in December.  This helped.  Still has cough with clear sputum.  Not having fever, wheeze, chest pain, or hemoptysis.  Trelegy helps.  Still smoking cigarettes.   Respiratory Exam:   Appearance - well kempt   ENMT - no sinus tenderness, no nasal discharge, no oral exudate  Respiratory - normal appearance of chest wall, normal respiratory effort w/o accessory muscle use, no dullness on percussion, no wheezing or rales  CV - s1s2 regular rate and rhythm, no murmurs, no peripheral edema, radial pulses symmetric  Ext - no cyanosis, clubbing, or joint inflammation noted    Assessment/Plan:   COPD with chronic bronchitis and emphysema. - not at goal - continue trelegy - continue singulair >> advised that we can assume refilling this  - prn albuterol  Tobacco abuse. - she will try quitting on her own  Allergic rhinitis. - continue astelin and singulair  Polycythemia. - repeat CBC - if Hb still elevated, then repeat overnight oximetery  COVID 19 exposure. - she has several family members who were sick and she is concerned she could have been exposed also - will arrange for COVID 19 IgG testing   She has requested that health information not be shared with her daughter without her prior approval.  A total of  36 minutes were spent face to face and non face to face with the patient  and more than half of that time involved counseling or coordination of care.   Chesley Mires, MD  Pulmonary/Critical Care Pager: [phone] 05/24/2019, 10:59 AM  Flow Sheet     Pulmonary tests:  PFT 12/15/08 FEV1 2.20(90%), FVC 3.68(113%), FEV1% 60, TLC 6.54(132%), DLCO 75%, no BD response Sputum culture 06/27/16 >> Moraxella catarrhalis PFT 08/25/16 >> FEV1 1.55 (59%), FEV1% 57, TLC 5.33 (103%), DLCO 52% A1AT 07/14/18 >> 166, MM  Sleep testing:  ONO with RA 07/29/18 >> test time 6 hrs 23 min.  Basal SpO2 86%, low SpO2 72%.  Spent 289.2 min with SpO2 < 88%.  Chest imaging:  CT chest 04/21/16 >> borderline LAN, nodules in lung bases with tree in bud CT chest 10/22/16 >> moderate centrilobular emphysema, 1.2 cm nodule LUL, tree in bud, RLL BTX CT chest 01/26/17 >> moderate centrilobular emphysema, mild b/l BTX with tree in bud, LUL nodule resolved, stable 7 mm RLL nodule since 2015 HRCT 12/01/18 >> atherosclerosis, apical scarring, centrilobular emphysema, nodularity in RLL, 6 mm nodule Rt major fissure  Medications:   Allergies as of 05/24/2019   No Known Allergies     Medication List       Accurate as of May 24, 2019 10:59 AM. If you have any questions, ask your nurse or doctor.        AeroChamber MV inhaler Use as instructed   albuterol (2.5 MG/3ML) 0.083% nebulizer solution Commonly known as: PROVENTIL Take 3 mLs (  2.5 mg total) by nebulization every 6 (six) hours as needed for wheezing or shortness of breath.   Ventolin HFA 108 (90 Base) MCG/ACT inhaler Generic drug: albuterol TAKE 2 PUFFS BY MOUTH EVERY 6 HOURS AS NEEDED FOR WHEEZE OR SHORTNESS OF BREATH   alendronate 70 MG tablet Commonly known as: FOSAMAX Take 1 tablet (70 mg total) by mouth every 7 (seven) days. Take with a full glass of water on an empty stomach.   azelastine 0.1 % nasal spray Commonly known as: ASTELIN PLACE 1 SPRAY INTO BOTH NOSTRILS 2 (TWO) TIMES DAILY. USE IN EACH NOSTRIL AS  DIRECTED   fluorouracil 5 % cream Commonly known as: EFUDEX APPLY1 APPLICATION ON THE SKIN AT BEDTIME FOR 4 WEEKS   Flutter Devi Twice a day and prn as needed, may increase if feeling worse   gabapentin 100 MG capsule Commonly known as: NEURONTIN TAKE 2 CAPSULES (200 MG TOTAL) BY MOUTH AT BEDTIME.   montelukast 10 MG tablet Commonly known as: SINGULAIR Take 1 tablet (10 mg total) by mouth at bedtime.   multivitamin tablet Take 1 tablet by mouth daily.   Trelegy Ellipta 100-62.5-25 MCG/INH Aepb Generic drug: Fluticasone-Umeclidin-Vilant Inhale 1 puff into the lungs daily.   tretinoin 0.05 % cream Commonly known as: RETIN-A APPLY TO AFFECTED AREA EVERY DAY AT BEDTIME **PA SENT**   VITAMIN D (CHOLECALCIFEROL) PO Take 5,000 Units by mouth daily.       Past Surgical History:  She  has a past surgical history that includes Dilation and curettage of uterus (2009); DEXA (12/2014); and Skin lesion excision (Left, 05/24/2018).  Family History:  Her family history includes Arthritis in her mother; Breast cancer in her maternal grandmother; Cancer in her father; Colon cancer (age of onset: 81) in her paternal aunt; Diabetes in her mother; Glaucoma in her mother; Heart attack in her paternal grandfather; Heart disease in her father and maternal grandmother; Hypertension in her father and mother; Irritable bowel syndrome in her mother; Kidney disease in her mother; Other in her paternal grandmother; Stroke in her maternal grandfather and maternal grandmother.  Social History:  She  reports that she has been smoking cigarettes. She started smoking about 41 years ago. She has a 40.00 pack-year smoking history. She has never used smokeless tobacco. She reports current alcohol use of about 7.0 standard drinks of alcohol per week. She reports that she does not use drugs.

## 2019-05-24 NOTE — Patient Instructions (Signed)
Lab tests today and will call with results  Follow up in 4 months

## 2019-05-24 NOTE — Addendum Note (Signed)
Addended by: Suzzanne Cloud E on: 05/24/2019 11:12 AM   Modules accepted: Orders

## 2019-05-24 NOTE — Telephone Encounter (Signed)
CBC Latest Ref Rng & Units 05/24/2019 03/25/2019 03/16/2019  WBC 4.0 - 10.5 K/uL 8.9 10.9(H) 15.4(H)  Hemoglobin 12.0 - 15.0 g/dL 15.8(H) 16.8(H) 15.4(H)  Hematocrit 36.0 - 46.0 % 47.9(H) 50.7(H) 46.8(H)  Platelets 150.0 - 400.0 K/uL 198.0 292.0 263.0    Please let her know her hemoglobin level is still elevated.  Please arrange for ONO with RA to determine if she needs to start supplemental oxygen at night.

## 2019-05-25 LAB — SAR COV2 SEROLOGY (COVID19)AB(IGG),IA: SARS CoV2 AB IGG: NEGATIVE

## 2019-05-26 ENCOUNTER — Ambulatory Visit: Payer: BC Managed Care – PPO | Admitting: Pulmonary Disease

## 2019-06-06 ENCOUNTER — Encounter: Payer: Self-pay | Admitting: Family Medicine

## 2019-06-06 MED ORDER — MONTELUKAST SODIUM 10 MG PO TABS: 10.0000 mg | ORAL_TABLET | Freq: Every day | ORAL | 11 refills | Status: DC

## 2019-06-24 ENCOUNTER — Encounter: Payer: Self-pay | Admitting: Family Medicine

## 2019-06-24 ENCOUNTER — Other Ambulatory Visit: Payer: Self-pay

## 2019-06-24 ENCOUNTER — Ambulatory Visit (INDEPENDENT_AMBULATORY_CARE_PROVIDER_SITE_OTHER): Payer: BC Managed Care – PPO | Admitting: Family Medicine

## 2019-06-24 VITALS — BP 152/79 | HR 81 | Temp 97.7°F | Resp 16

## 2019-06-24 DIAGNOSIS — N390 Urinary tract infection, site not specified: Secondary | ICD-10-CM

## 2019-06-24 DIAGNOSIS — R350 Frequency of micturition: Secondary | ICD-10-CM

## 2019-06-24 DIAGNOSIS — R319 Hematuria, unspecified: Secondary | ICD-10-CM | POA: Diagnosis not present

## 2019-06-24 LAB — POCT URINALYSIS DIPSTICK
Bilirubin, UA: NEGATIVE
Glucose, UA: NEGATIVE
Ketones, UA: NEGATIVE
Nitrite, UA: POSITIVE
Protein, UA: POSITIVE — AB
Spec Grav, UA: >=1.03 — AB (ref 1.010–1.025)
Urobilinogen, UA: 0.2 E.U./dL
pH, UA: 6 (ref 5.0–8.0)

## 2019-06-24 MED ORDER — ALENDRONATE SODIUM 70 MG PO TABS: 70.0000 mg | ORAL_TABLET | ORAL | 11 refills | Status: DC

## 2019-06-24 MED ORDER — LEVOFLOXACIN 750 MG PO TABS: 750.0000 mg | ORAL_TABLET | Freq: Every day | ORAL | 0 refills | Status: DC

## 2019-06-24 NOTE — Patient Instructions (Addendum)
Start levaquin today.  Flush kidneys pleanty of water.  Start aleve (twice days) or advil (600 mg every 8 hrs) for pain. If pain worsens please be seen in ED for possible kidney stones.     Urinary Tract Infection, Adult A urinary tract infection (UTI) is an infection of any part of the urinary tract. The urinary tract includes:  The kidneys.  The ureters.  The bladder.  The urethra. These organs make, store, and get rid of pee (urine) in the body. What are the causes? This is caused by germs (bacteria) in your genital area. These germs grow and cause swelling (inflammation) of your urinary tract. What increases the risk? You are more likely to develop this condition if:  You have a small, thin tube (catheter) to drain pee.  You cannot control when you pee or poop (incontinence).  You are female, and: ? You use these methods to prevent pregnancy:  A medicine that kills sperm (spermicide).  A device that blocks sperm (diaphragm). ? You have low levels of a female hormone (estrogen). ? You are pregnant.  You have genes that add to your risk.  You are sexually active.  You take antibiotic medicines.  You have trouble peeing because of: ? A prostate that is bigger than normal, if you are female. ? A blockage in the part of your body that drains pee from the bladder (urethra). ? A kidney stone. ? A nerve condition that affects your bladder (neurogenic bladder). ? Not getting enough to drink. ? Not peeing often enough.  You have other conditions, such as: ? Diabetes. ? A weak disease-fighting system (immune system). ? Sickle cell disease. ? Gout. ? Injury of the spine. What are the signs or symptoms? Symptoms of this condition include:  Needing to pee right away (urgently).  Peeing often.  Peeing small amounts often.  Pain or burning when peeing.  Blood in the pee.  Pee that smells bad or not like normal.  Trouble peeing.  Pee that is cloudy.  Fluid  coming from the vagina, if you are female.  Pain in the belly or lower back. Other symptoms include:  Throwing up (vomiting).  No urge to eat.  Feeling mixed up (confused).  Being tired and grouchy (irritable).  A fever.  Watery poop (diarrhea). How is this treated? This condition may be treated with:  Antibiotic medicine.  Other medicines.  Drinking enough water. Follow these instructions at home:  Medicines  Take over-the-counter and prescription medicines only as told by your doctor.  If you were prescribed an antibiotic medicine, take it as told by your doctor. Do not stop taking it even if you start to feel better. General instructions  Make sure you: ? Pee until your bladder is empty. ? Do not hold pee for a long time. ? Empty your bladder after sex. ? Wipe from front to back after pooping if you are a female. Use each tissue one time when you wipe.  Drink enough fluid to keep your pee pale yellow.  Keep all follow-up visits as told by your doctor. This is important. Contact a doctor if:  You do not get better after 1-2 days.  Your symptoms go away and then come back. Get help right away if:  You have very bad back pain.  You have very bad pain in your lower belly.  You have a fever.  You are sick to your stomach (nauseous).  You are throwing up. Summary  A urinary  tract infection (UTI) is an infection of any part of the urinary tract.  This condition is caused by germs in your genital area.  There are many risk factors for a UTI. These include having a small, thin tube to drain pee and not being able to control when you pee or poop.  Treatment includes antibiotic medicines for germs.  Drink enough fluid to keep your pee pale yellow. This information is not intended to replace advice given to you by your health care provider. Make sure you discuss any questions you have with your health care provider. Document Revised: 04/08/2018 Document  Reviewed: 10/29/2017 Elsevier Patient Education  2020 [employer].

## 2019-06-24 NOTE — Progress Notes (Signed)
This visit occurred during the SARS-CoV-2 public health emergency.  Safety protocols were in place, including screening questions prior to the visit, additional usage of staff PPE, and extensive cleaning of exam room while observing appropriate contact time as indicated for disinfecting solutions.    [name] , 1955/09/09, 64 y.o., female MRN: [id number] Patient Care Team    Relationship Specialty Notifications Start End  Ma Hillock, DO PCP - General Family Medicine  04/23/15   Volanda Napoleon, MD Consulting Physician Oncology  05/12/13   Maisie Fus, MD Consulting Physician Obstetrics and Gynecology  01/01/15   Chesley Mires, MD Consulting Physician Pulmonary Disease  02/22/16   Carolan Clines, MD (Inactive) Consulting Physician Urology  04/21/16   Ayesha Mohair  Dermatology  07/04/16    Comment: [facility], Olevia Bowens, DO Consulting Physician Sports Medicine  05/17/18   Plonk, Noni Saupe, PA-C  Physician Assistant  05/25/18     Chief Complaint  Patient presents with  . Back Pain    Left sided back pain with urine frequency x2 days.   . Urinary Frequency     Subjective: Pt presents for an OV with complaints of painful urination of 2 days duration.  Associated symptoms include pain that radiates up over left flank area with urination and mils nausea. Yesterday she report she just did not feel well.  She endorses noting small amount of blood on her tissue with wiping. She has had a h/o pyelonephritis, diverticulitis and kidney stones (x1) in the past. She denies bowel changes.  Depression screen [facility] 2/9 10/28/2017 08/10/2017  Decreased Interest 0 0  Down, Depressed, Hopeless 0 0  PHQ - 2 Score 0 0  Some recent data might be hidden    No Known Allergies Social History   Social History Narrative  . Not on file   Past Medical History:  Diagnosis Date  . Arthritis   . Basal cell carcinoma   . COPD (chronic obstructive  pulmonary disease) ([place])   . DVT (deep venous thrombosis) ([place])   . History of acute bronchitis 03/24/2011  . Kidney stones   . Lymphadenopathy, abdominal 03/2013   Noted on abd/pelv CT done to eval slow-to-resolve pyelo  . Meniere disorder 01/23/2012  . Osteoporosis 12/2014   Dr. Nori Riis, her GYN, dx'd her  . Pneumonia   . Recurrent pyelonephritis   . Seasonal allergic rhinitis   . Squamous cell carcinoma in situ (SCCIS) of skin of lower leg 05/04/2018   Dr. Janan Ridge, [facility]   Past Surgical History:  Procedure Laterality Date  . DEXA  12/2014   T score spine -2.4, hip -3.5 (Dr. Nori Riis, [facility].  [name] DILATION AND CURETTAGE OF UTERUS  2009  . SKIN LESION EXCISION Left 05/24/2018   SCC; Skin, Excision, Left La[place] leg: postsurgical scar, no neoplasm is identified.  Skin, Shave bx and ED&C, right anterior thigh-Superficially invasive squamous cell carcinoma, extending to the deep margin.   Family History  Problem Relation Age of Onset  . Hypertension Mother   . Glaucoma Mother   . Irritable bowel syndrome Mother   . Arthritis Mother   . Kidney disease Mother   . Diabetes Mother   . Hypertension Father   . Heart disease Father        bypasses  . Cancer Father        sarcoma  . Heart disease Maternal Grandmother  CHF  . Stroke Maternal Grandmother        in his 27's  . Breast cancer Maternal Grandmother   . Stroke Maternal Grandfather   . Other Paternal Grandmother        hardening of the arteries  . Heart attack Paternal Grandfather   . Colon cancer Paternal Aunt 37  . Stomach cancer Neg Hx    Allergies as of 06/24/2019   No Known Allergies     Medication List       Accurate as of June 24, 2019  1:53 PM. If you have any questions, ask your nurse or doctor.        AeroChamber MV inhaler Use as instructed   albuterol (2.5 MG/3ML) 0.083% nebulizer solution Commonly known as: PROVENTIL Take 3 mLs (2.5 mg total) by  nebulization every 6 (six) hours as needed for wheezing or shortness of breath.   Ventolin HFA 108 (90 Base) MCG/ACT inhaler Generic drug: albuterol TAKE 2 PUFFS BY MOUTH EVERY 6 HOURS AS NEEDED FOR WHEEZE OR SHORTNESS OF BREATH   alendronate 70 MG tablet Commonly known as: FOSAMAX Take 1 tablet (70 mg total) by mouth every 7 (seven) days. Take with a full glass of water on an empty stomach.   azelastine 0.1 % nasal spray Commonly known as: ASTELIN PLACE 1 SPRAY INTO BOTH NOSTRILS 2 (TWO) TIMES DAILY. USE IN EACH NOSTRIL AS DIRECTED   fluorouracil 5 % cream Commonly known as: EFUDEX APPLY1 APPLICATION ON THE SKIN AT BEDTIME FOR 4 WEEKS   Flutter Devi Twice a day and prn as needed, may increase if feeling worse   gabapentin 100 MG capsule Commonly known as: NEURONTIN TAKE 2 CAPSULES (200 MG TOTAL) BY MOUTH AT BEDTIME.   montelukast 10 MG tablet Commonly known as: SINGULAIR Take 1 tablet (10 mg total) by mouth at bedtime.   multivitamin tablet Take 1 tablet by mouth daily.   Trelegy Ellipta 100-62.5-25 MCG/INH Aepb Generic drug: Fluticasone-Umeclidin-Vilant Inhale 1 puff into the lungs daily.   tretinoin 0.05 % cream Commonly known as: RETIN-A APPLY TO AFFECTED AREA EVERY DAY AT BEDTIME **PA SENT**   VITAMIN D (CHOLECALCIFEROL) PO Take 5,000 Units by mouth daily.       All past medical history, surgical history, allergies, family history, immunizations andmedications were updated in the EMR today and reviewed under the history and medication portions of their EMR.     ROS: Negative, with the exception of above mentioned in HPI   Objective:  BP (!) 152/79 (BP Location: Right Arm, Patient Position: Sitting, Cuff Size: Normal)   Pulse 81   Temp 97.7 F (36.5 C) (Temporal)   Resp 16   SpO2 95%  There is no height or weight on file to calculate BMI. Gen: Afebrile. No acute distress. Nontoxic in appearance, well developed, well nourished.  HENT: AT. [place].    Eyes:Pupils Equal Round Reactive to light, Extraocular movements intact,  Conjunctiva without redness, discharge or icterus. CV: RRR  Chest: CTAB Abd: Soft. flat. ND. Mild TTP left flank area. BS present. no Masses palpated. No rebound or guarding. MSK: no CVA TTP bilaterally.  Skin: no rashes, purpura or petechiae.  Neuro:  Normal gait. PERLA. EOMi. Alert. Oriented x3  Psych: Normal affect, dress and demeanor. Normal speech. Normal thought content and judgment.  No exam data present No results found. Results for orders placed or performed in visit on 06/24/19 (from the past 24 hour(s))  POCT urinalysis dipstick     Status:  Abnormal   Collection Time: 06/24/19  1:48 PM  Result Value Ref Range   Color, UA yellow    Clarity, UA cloudy    Glucose, UA Negative Negative   Bilirubin, UA negative    Ketones, UA negative    Spec Grav, UA >=1.030 (A) 1.010 - 1.025   Blood, UA 3+    pH, UA 6.0 5.0 - 8.0   Protein, UA Positive (A) Negative   Urobilinogen, UA 0.2 0.2 or 1.0 E.U./dL   Nitrite, UA positive    Leukocytes, UA Moderate (2+) (A) Negative   Appearance     Odor      Assessment/Plan: [name] is a 64 y.o. female present for OV for  Urinary frequency/Hematuria, unspecified type/Urinary tract infection with hematuria, site unspecified - given h/o elected to treat with LQ today. Sent for urine culture. - push fluids.  - NSAID use encourage.  - POCT urinalysis dipstick - Urine Culture - if worsening symptoms over the weekend she is to be seen in ED for image and labs to rule out pyelo vs kidneys stones.     Reviewed expectations re: course of current medical issues.  Discussed self-management of symptoms.  Outlined signs and symptoms indicating need for more acute intervention.  Patient verbalized understanding and all questions were answered.  Patient received an After-Visit Summary.    Orders Placed This Encounter  Procedures  . POCT urinalysis dipstick    No orders of the defined types were placed in this encounter.  Referral Orders  No referral(s) requested today     Note is dictated utilizing voice recognition software. Although note has been proof read prior to signing, occasional typographical errors still can be missed. If any questions arise, please do not hesitate to call for verification.   electronically signed by:  Howard Pouch, DO  [place]

## 2019-06-26 LAB — URINE CULTURE
MICRO NUMBER:: 10169410
SPECIMEN QUALITY:: ADEQUATE

## 2019-06-27 ENCOUNTER — Encounter: Payer: Self-pay | Admitting: Family Medicine

## 2019-06-29 DIAGNOSIS — C44729 Squamous cell carcinoma of skin of left lower limb, including hip: Secondary | ICD-10-CM | POA: Diagnosis not present

## 2019-06-29 DIAGNOSIS — L578 Other skin changes due to chronic exposure to nonionizing radiation: Secondary | ICD-10-CM | POA: Diagnosis not present

## 2019-06-29 DIAGNOSIS — L821 Other seborrheic keratosis: Secondary | ICD-10-CM | POA: Diagnosis not present

## 2019-06-29 DIAGNOSIS — D1801 Hemangioma of skin and subcutaneous tissue: Secondary | ICD-10-CM | POA: Diagnosis not present

## 2019-06-29 DIAGNOSIS — L814 Other melanin hyperpigmentation: Secondary | ICD-10-CM | POA: Diagnosis not present

## 2019-06-29 DIAGNOSIS — B078 Other viral warts: Secondary | ICD-10-CM | POA: Diagnosis not present

## 2019-06-29 DIAGNOSIS — D485 Neoplasm of uncertain behavior of skin: Secondary | ICD-10-CM | POA: Diagnosis not present

## 2019-06-29 DIAGNOSIS — C44722 Squamous cell carcinoma of skin of right lower limb, including hip: Secondary | ICD-10-CM | POA: Diagnosis not present

## 2019-06-29 DIAGNOSIS — L570 Actinic keratosis: Secondary | ICD-10-CM | POA: Diagnosis not present

## 2019-07-13 DIAGNOSIS — D0471 Carcinoma in situ of skin of right lower limb, including hip: Secondary | ICD-10-CM | POA: Diagnosis not present

## 2019-07-13 DIAGNOSIS — L244 Irritant contact dermatitis due to drugs in contact with skin: Secondary | ICD-10-CM | POA: Diagnosis not present

## 2019-07-13 DIAGNOSIS — L570 Actinic keratosis: Secondary | ICD-10-CM | POA: Diagnosis not present

## 2019-07-19 DIAGNOSIS — Z23 Encounter for immunization: Secondary | ICD-10-CM | POA: Diagnosis not present

## 2019-07-26 ENCOUNTER — Other Ambulatory Visit: Payer: Self-pay | Admitting: Pulmonary Disease

## 2019-08-01 ENCOUNTER — Other Ambulatory Visit: Payer: Self-pay | Admitting: Family Medicine

## 2019-08-01 DIAGNOSIS — D0472 Carcinoma in situ of skin of left lower limb, including hip: Secondary | ICD-10-CM | POA: Diagnosis not present

## 2019-08-01 DIAGNOSIS — C44722 Squamous cell carcinoma of skin of right lower limb, including hip: Secondary | ICD-10-CM | POA: Diagnosis not present

## 2019-08-03 ENCOUNTER — Other Ambulatory Visit: Payer: Self-pay | Admitting: Family Medicine

## 2019-08-03 ENCOUNTER — Other Ambulatory Visit: Payer: Self-pay | Admitting: Pulmonary Disease

## 2019-08-15 DIAGNOSIS — D0472 Carcinoma in situ of skin of left lower limb, including hip: Secondary | ICD-10-CM | POA: Diagnosis not present

## 2019-08-16 DIAGNOSIS — Z23 Encounter for immunization: Secondary | ICD-10-CM | POA: Diagnosis not present

## 2019-08-22 ENCOUNTER — Other Ambulatory Visit: Payer: Self-pay

## 2019-08-22 ENCOUNTER — Encounter: Payer: Self-pay | Admitting: Family Medicine

## 2019-08-22 ENCOUNTER — Telehealth (INDEPENDENT_AMBULATORY_CARE_PROVIDER_SITE_OTHER): Payer: BC Managed Care – PPO | Admitting: Family Medicine

## 2019-08-22 VITALS — Temp 100.6°F | Ht 65.0 in

## 2019-08-22 DIAGNOSIS — K5792 Diverticulitis of intestine, part unspecified, without perforation or abscess without bleeding: Secondary | ICD-10-CM | POA: Diagnosis not present

## 2019-08-22 DIAGNOSIS — R05 Cough: Secondary | ICD-10-CM | POA: Diagnosis not present

## 2019-08-22 DIAGNOSIS — R059 Cough, unspecified: Secondary | ICD-10-CM

## 2019-08-22 MED ORDER — METRONIDAZOLE 500 MG PO TABS: 500.0000 mg | ORAL_TABLET | Freq: Three times a day (TID) | ORAL | 0 refills | Status: DC

## 2019-08-22 MED ORDER — LEVOFLOXACIN 750 MG PO TABS: 750.0000 mg | ORAL_TABLET | Freq: Every day | ORAL | 0 refills | Status: DC

## 2019-08-22 NOTE — Progress Notes (Signed)
VIRTUAL VISIT VIA VIDEO  I connected with [name] on 08/22/19 at  4:00 PM EDT by elemedicine application and verified that I am speaking with the correct person using two identifiers. Location patient: Home Location provider: [facility], Office Persons participating in the virtual visit: Patient, Dr. Raoul Pitch and R.Baker, LPN  I discussed the limitations of evaluation and management by telemedicine and the availability of in person appointments. The patient expressed understanding and agreed to proceed.   SUBJECTIVE Chief Complaint  Patient presents with  . Fever    x5 days. Pt is having abd pain when she presses on it with her hand. Painful to breathe on left side at times.   . Cough  . Nasal Congestion  . Headache    HPI: [name] is a 64 y.o. female present for fever 100.6 F today.  She reports she has noticed an intermittent productive cough.  Her cough has increased since onset 5 days ago.  She has a history of recurrent upper respiratory infections and COPD.  She also was recently diagnosed with diverticulosis.  She reports that she also noticed abdominal pain over the last 2 days as well.  She states is tender to touch in her left lower quadrant.  Her stools have changed to be softer with mild diarrhea.  She denies any blood per rectum.  She denies nausea or vomit.  ROS: See pertinent positives and negatives per HPI.  Patient Active Problem List   Diagnosis Date Noted  . Abdominal pain 03/16/2019  . Bronchiectasis with acute exacerbation ([place]) 09/10/2018  . Rash of hands 09/10/2018  . Nocturnal hypoxemia due to emphysema ([place]) 08/10/2018  . Avulsion fracture of lateral malleolus of right fibula 07/20/2018  . Bronchitis 05/20/2018  . Squamous cell carcinoma in situ (SCCIS) of skin of lower leg 05/04/2018  . Nonallopathic lesion of lumbosacral region 01/12/2018  . Nonallopathic lesion of sacral region 01/12/2018  . Nonallopathic lesion of thoracic  region 01/12/2018  . Nonallopathic lesion of rib cage 01/12/2018  . Nonallopathic lesion of cervical region 01/12/2018  . Osteoporosis without current pathological fracture 10/28/2017  . Pyelonephritis 10/28/2017  . Low back pain 10/15/2017  . Polyarthralgia 10/15/2017  . Acute bursitis of right shoulder 09/16/2017  . Acute lateral meniscal tear 09/09/2017  . Abnormal CT scan of lung 04/23/2016  . Pulmonary nodules/lesions, multiple 04/23/2016  . Mediastinal adenopathy 02/22/2016  . Current every day smoker 02/22/2016  . Encounter for smoking cessation counseling 02/22/2016  . Hematuria 06/07/2015  . Meniere disorder 01/23/2012  . Squamous cell carcinoma   . Nicotine dependence 12/04/2008  . COPD (chronic obstructive pulmonary disease) ([place]) 12/04/2008    Social History   Tobacco Use  . Smoking status: Current Every Day Smoker    Packs/day: 1.00    Years: 40.00    Pack years: 40.00    Types: Cigarettes    Start date: 06/01/1978  . Smokeless tobacco: Never Used  Substance Use Topics  . Alcohol use: Yes    Alcohol/week: 7.0 standard drinks    Types: 7 Glasses of wine per week    Comment: 1 per day    Current Outpatient Medications:  .  albuterol (PROVENTIL) (2.5 MG/3ML) 0.083% nebulizer solution, Take 3 mLs (2.5 mg total) by nebulization every 6 (six) hours as needed for wheezing or shortness of breath., Disp: 75 mL, Rfl: 1 .  alendronate (FOSAMAX) 70 MG tablet, Take 1 tablet (70 mg total) by mouth every 7 (seven)  days. Take with a full glass of water on an empty stomach., Disp: 4 tablet, Rfl: 11 .  azelastine (ASTELIN) 0.1 % nasal spray, PLACE 1 SPRAY INTO BOTH NOSTRILS 2 (TWO) TIMES DAILY. USE IN EACH NOSTRIL AS DIRECTED, Disp: 30 mL, Rfl: 1 .  Fluticasone-Umeclidin-Vilant (TRELEGY ELLIPTA) 100-62.5-25 MCG/INH AEPB, Inhale 1 puff into the lungs daily., Disp: 28 each, Rfl: 5 .  montelukast (SINGULAIR) 10 MG tablet, Take 1 tablet (10 mg total) by mouth at bedtime., Disp: 30  tablet, Rfl: 11 .  Multiple Vitamin (MULTIVITAMIN) tablet, Take 1 tablet by mouth daily., Disp: , Rfl:  .  Respiratory Therapy Supplies (FLUTTER) DEVI, Twice a day and prn as needed, may increase if feeling worse, Disp: 1 each, Rfl: 0 .  Spacer/Aero-Holding Chambers (AEROCHAMBER MV) inhaler, Use as instructed, Disp: 1 each, Rfl: 2 .  tretinoin (RETIN-A) 0.05 % cream, APPLY TO AFFECTED AREA EVERY DAY AT BEDTIME **PA SENT**, Disp: , Rfl:  .  VENTOLIN HFA 108 (90 Base) MCG/ACT inhaler, TAKE 2 PUFFS BY MOUTH EVERY 6 HOURS AS NEEDED FOR WHEEZE OR SHORTNESS OF BREATH, Disp: 18 g, Rfl: 3 .  VITAMIN D, CHOLECALCIFEROL, PO, Take 5,000 Units by mouth daily. , Disp: , Rfl:  .  fluorouracil (EFUDEX) 5 % cream, APPLY1 APPLICATION ON THE SKIN AT BEDTIME FOR 4 WEEKS, Disp: , Rfl: 3 .  gabapentin (NEURONTIN) 100 MG capsule, TAKE 2 CAPSULES (200 MG TOTAL) BY MOUTH AT BEDTIME. (Patient not taking: Reported on 08/22/2019), Disp: 180 capsule, Rfl: 1 .  levofloxacin (LEVAQUIN) 750 MG tablet, Take 1 tablet (750 mg total) by mouth daily., Disp: 7 tablet, Rfl: 0 .  metroNIDAZOLE (FLAGYL) 500 MG tablet, Take 1 tablet (500 mg total) by mouth 3 (three) times daily., Disp: 21 tablet, Rfl: 0  No Known Allergies  OBJECTIVE: Temp (!) 100.6 F (38.1 C) (Temporal)   Ht 5\' 5"  (1.651 m)   BMI 18.37 kg/m  Gen: No acute distress. Nontoxic in appearance.  HENT: AT. [place].  MMM.  Eyes:Pupils Equal Round Reactive to light, Extraocular movements intact,  Conjunctiva without redness, discharge or icterus. Chest: Cough mild, present.  No shortness of breath. Skin: No rashes, purpura or petechiae.  Neuro:  Normal gait. Alert. Oriented x3  Psych: Normal affect and demeanor. Normal speech. Normal thought content and judgment.  ASSESSMENT AND PLAN: [name] is a 64 y.o. female present for  Cough/diverticulitis Uncertain if her fever is secondary to her upper respiratory symptoms.  She does have frequent bronchitis and pneumonia.   Suspect there is a possibility she actually has diverticulitis causing her fever given her left lower quadrant discomfort reported and change in bowel habits. Given this is a virtual video visit exam is limited. Elected to treat with Levaquin and Flagyl.  Explained to patient the Levaquin is what we usually used to treat her upper respiratory infections and it also will treat diverticulitis with the addition of Flagyl. Patient agrees to the above plan. If abdominal pain or cough worsens must be seen immediately in the emergency room to further evaluate her ABD pain.  Patient reports understanding.   Howard Pouch, DO 08/22/2019   Return in about 2 weeks (around 09/05/2019), or if symptoms worsen or fail to improve.  No orders of the defined types were placed in this encounter.  Meds ordered this encounter  Medications  . levofloxacin (LEVAQUIN) 750 MG tablet    Sig: Take 1 tablet (750 mg total) by mouth daily.    Dispense:  7 tablet    Refill:  0  . metroNIDAZOLE (FLAGYL) 500 MG tablet    Sig: Take 1 tablet (500 mg total) by mouth 3 (three) times daily.    Dispense:  21 tablet    Refill:  0   Referral Orders  No referral(s) requested today

## 2019-08-22 NOTE — Patient Instructions (Addendum)
Start levaquin and flagyl today. Take as directed for 7 days each.  Rest and hydrate. If worsening abdominal pain would need to get seen emergently.   Diverticulitis  Diverticulitis is when small pockets in your large intestine (colon) get infected or swollen. This causes stomach pain and watery poop (diarrhea). These pouches are called diverticula. They form in people who have a condition called diverticulosis. Follow these instructions at home: Medicines  Take over-the-counter and prescription medicines only as told by your doctor. These include: ? Antibiotics. ? Pain medicines. ? Fiber pills. ? Probiotics. ? Stool softeners.  Do not drive or use heavy machinery while taking prescription pain medicine.  If you were prescribed an antibiotic, take it as told. Do not stop taking it even if you feel better. General instructions   Follow a diet as told by your doctor.  When you feel better, your doctor may tell you to change your diet. You may need to eat a lot of fiber. Fiber makes it easier to poop (have bowel movements). Healthy foods with fiber include: ? Berries. ? Beans. ? Lentils. ? Green vegetables.  Exercise 3 or more times a week. Aim for 30 minutes each time. Exercise enough to sweat and make your heart beat faster.  Keep all follow-up visits as told. This is important. You may need to have an exam of the large intestine. This is called a colonoscopy. Contact a doctor if:  Your pain does not get better.  You have a hard time eating or drinking.  You are not pooping like normal. Get help right away if:  Your pain gets worse.  Your problems do not get better.  Your problems get worse very fast.  You have a fever.  You throw up (vomit) more than one time.  You have poop that is: ? Bloody. ? Black. ? Tarry. Summary  Diverticulitis is when small pockets in your large intestine (colon) get infected or swollen.  Take medicines only as told by your  doctor.  Follow a diet as told by your doctor. This information is not intended to replace advice given to you by your health care provider. Make sure you discuss any questions you have with your health care provider. Document Revised: 04/03/2017 Document Reviewed: 05/08/2016 Elsevier Patient Education  [place].

## 2019-08-23 ENCOUNTER — Telehealth: Payer: BC Managed Care – PPO | Admitting: Family Medicine

## 2019-08-29 DIAGNOSIS — L570 Actinic keratosis: Secondary | ICD-10-CM | POA: Diagnosis not present

## 2019-08-29 DIAGNOSIS — L905 Scar conditions and fibrosis of skin: Secondary | ICD-10-CM | POA: Diagnosis not present

## 2019-08-29 DIAGNOSIS — Z85828 Personal history of other malignant neoplasm of skin: Secondary | ICD-10-CM | POA: Diagnosis not present

## 2019-08-29 DIAGNOSIS — D485 Neoplasm of uncertain behavior of skin: Secondary | ICD-10-CM | POA: Diagnosis not present

## 2019-08-29 DIAGNOSIS — C44729 Squamous cell carcinoma of skin of left lower limb, including hip: Secondary | ICD-10-CM | POA: Diagnosis not present

## 2019-08-29 DIAGNOSIS — C44722 Squamous cell carcinoma of skin of right lower limb, including hip: Secondary | ICD-10-CM | POA: Diagnosis not present

## 2019-08-29 DIAGNOSIS — L821 Other seborrheic keratosis: Secondary | ICD-10-CM | POA: Diagnosis not present

## 2019-09-08 DIAGNOSIS — D0471 Carcinoma in situ of skin of right lower limb, including hip: Secondary | ICD-10-CM | POA: Diagnosis not present

## 2019-09-08 DIAGNOSIS — D0472 Carcinoma in situ of skin of left lower limb, including hip: Secondary | ICD-10-CM | POA: Diagnosis not present

## 2019-09-16 ENCOUNTER — Telehealth: Payer: Self-pay

## 2019-09-16 NOTE — Telephone Encounter (Signed)
[facility] - Client TELEPHONE ADVICE RECORD [facility] Patient Name: [name] Gender: Female DOB: April 10, 1956 Age: 64 Y 37 M 15 D Return Phone Number: [phone] (Primary), [phone] (Secondary) Address: City/State/Zip: [place]  [address] Client [facility] - Client Client Site [place] - Day Physician [name], [place] Contact Type Call Who Is Calling Patient / Member / Family / Caregiver Call Type Triage / Clinical Relationship To Patient Self Return Phone Number [phone] (Primary) Chief Complaint Urination Pain Reason for Call Symptomatic / Request for [name] states she has a kidney infection. Translation No Nurse Assessment Nurse: Kathi Ludwig, RN, Leana Roe Date/Time (Eastern Time): 09/16/2019 12:52:25 PM Confirm and document reason for call. If symptomatic, describe symptoms.   ---Caller states pain in left side, painful urination. started yesterday. temp 100, took Advil  Has the patient had close contact with a person known or suspected to have the novel coronavirus illness OR traveled / lives in area with major community spread (including international travel) in the last 14 days from the onset of symptoms? * If Asymptomatic, screen for exposure and travel within the last 14 days. ---No Does the patient have any new or worsening symptoms? ---Yes Will a triage be completed? ---Yes Related visit to physician within the last 2 weeks? ---No Does the PT have any chronic conditions? (i.e. diabetes, asthma, this includes High risk factors for pregnancy, etc.) ---Yes List chronic conditions. ---COPD Is this a behavioral health or substance abuse call? ---No Guidelines Guideline Title Affirmed Question Affirmed Notes Nurse Date/Time Eilene Ghazi Time) Urination Pain - Female Side (flank) or lower back pain present Kathi Ludwig, Therapist, sports, [name] 09/16/2019 12:53:30 PM Disp.  Time Eilene Ghazi Time) Disposition Final User 09/16/2019 12:54:15 PM See HCP within 4 Hours (or PCP triage) Yes Kathi Ludwig, RN, TraciePLEASE NOTE: All timestamps contained within this report are represented as [place] Standard Time. CONFIDENTIALTY NOTICE: This fax transmission is intended only for the addressee. It contains information that is legally privileged, confidential or otherwise protected from use or disclosure. If you are not the intended recipient, you are strictly prohibited from reviewing, disclosing, copying using or disseminating any of this information or taking any action in reliance on or regarding this information. If you have received this fax in error, please notify us immediately by telephone so that we can arrange for its return to [place]. Phone: [phone], Toll-Free: [phone], Fax: [phone] Page: 2 of 2 Call Id: [id number] [place] Disagree/Comply Comply Caller Understands Yes PreDisposition Call Doctor Care Advice Given Per Guideline SEE HCP WITHIN 4 HOURS (OR PCP TRIAGE): * IF OFFICE WILL BE OPEN: You need to be seen within the next 3 or 4 hours. Call your doctor (or NP/PA) now or as soon as the office opens. PAIN MEDICINES: * For pain relief, you can take either acetaminophen, ibuprofen, or naproxen. * IBUPROFEN (E.G., MOTRIN, ADVIL): Take 400 mg (two 200 mg pills) by mouth every 6 hours. The most you should take each day is 1,200 mg (six 200 mg pills), unless your doctor has told you to take more. CALL BACK IF: * You become worse. CARE ADVICE given per Urination Pain - Female (Adult) guideline. Referrals REFERRED TO PCP OFFICE Warm transfer to backline   Per Dr [name] -Pt is to report to ED per last visit and if symptoms worsened/returned/or did not get better. She had UTI but also possible Diverticulitis.

## 2019-09-16 NOTE — Telephone Encounter (Signed)
Pt walked into office to be seen and asked if she could just drop off a urine sample. Pt advised to go to ED er Dr Raoul Pitch. Pt did not agree with that and said she has 4 kids at home and cannot go. Pt stated she will have to go to urgent care. Pt was told Dr Raoul Pitch would recommend ER where there is imaging and faster turn around time with labs.She verbalized understanding

## 2019-09-16 NOTE — Telephone Encounter (Signed)
Patient called in wanting to know if the 2:30 pm appointment slot was still avaviable as she hadn't received a call back yet regarding the appointment. let patient know that I needed to verify with nurse first and that nurse was in patient care at the moment. Advised patient someone would give her a call back as soon as they were available    Please call and advise

## 2019-09-19 ENCOUNTER — Ambulatory Visit (INDEPENDENT_AMBULATORY_CARE_PROVIDER_SITE_OTHER): Payer: BC Managed Care – PPO | Admitting: Family Medicine

## 2019-09-19 ENCOUNTER — Other Ambulatory Visit: Payer: Self-pay

## 2019-09-19 ENCOUNTER — Encounter: Payer: Self-pay | Admitting: Family Medicine

## 2019-09-19 VITALS — BP 147/96 | HR 110 | Temp 98.0°F | Resp 18 | Ht 65.0 in | Wt 108.0 lb

## 2019-09-19 DIAGNOSIS — R3129 Other microscopic hematuria: Secondary | ICD-10-CM

## 2019-09-19 DIAGNOSIS — R109 Unspecified abdominal pain: Secondary | ICD-10-CM

## 2019-09-19 DIAGNOSIS — N390 Urinary tract infection, site not specified: Secondary | ICD-10-CM | POA: Diagnosis not present

## 2019-09-19 DIAGNOSIS — R319 Hematuria, unspecified: Secondary | ICD-10-CM

## 2019-09-19 DIAGNOSIS — N12 Tubulo-interstitial nephritis, not specified as acute or chronic: Secondary | ICD-10-CM

## 2019-09-19 DIAGNOSIS — F17218 Nicotine dependence, cigarettes, with other nicotine-induced disorders: Secondary | ICD-10-CM

## 2019-09-19 LAB — CBC WITH DIFFERENTIAL/PLATELET
Basophils Absolute: 0.1 10*3/uL (ref 0.0–0.1)
Basophils Relative: 0.6 % (ref 0.0–3.0)
Eosinophils Absolute: 0.1 10*3/uL (ref 0.0–0.7)
Eosinophils Relative: 1.3 % (ref 0.0–5.0)
HCT: 46.2 % — ABNORMAL HIGH (ref 36.0–46.0)
Hemoglobin: 15.6 g/dL — ABNORMAL HIGH (ref 12.0–15.0)
Lymphocytes Relative: 24.9 % (ref 12.0–46.0)
Lymphs Abs: 2.2 10*3/uL (ref 0.7–4.0)
MCHC: 33.8 g/dL (ref 30.0–36.0)
MCV: 99.2 fl (ref 78.0–100.0)
Monocytes Absolute: 1.1 10*3/uL — ABNORMAL HIGH (ref 0.1–1.0)
Monocytes Relative: 12.8 % — ABNORMAL HIGH (ref 3.0–12.0)
Neutro Abs: 5.4 10*3/uL (ref 1.4–7.7)
Neutrophils Relative %: 60.4 % (ref 43.0–77.0)
Platelets: 179 10*3/uL (ref 150.0–400.0)
RBC: 4.66 Mil/uL (ref 3.87–5.11)
RDW: 14.2 % (ref 11.5–15.5)
WBC: 8.9 10*3/uL (ref 4.0–10.5)

## 2019-09-19 LAB — POCT URINALYSIS DIPSTICK
Bilirubin, UA: NEGATIVE
Glucose, UA: NEGATIVE
Ketones, UA: NEGATIVE
Nitrite, UA: POSITIVE
Protein, UA: POSITIVE — AB
Spec Grav, UA: 1.015 (ref 1.010–1.025)
Urobilinogen, UA: 1 E.U./dL
pH, UA: 6 (ref 5.0–8.0)

## 2019-09-19 LAB — BASIC METABOLIC PANEL
BUN: 10 mg/dL (ref 6–23)
CO2: 31 mEq/L (ref 19–32)
Calcium: 9.1 mg/dL (ref 8.4–10.5)
Chloride: 101 mEq/L (ref 96–112)
Creatinine, Ser: 0.6 mg/dL (ref 0.40–1.20)
GFR: 100.82 mL/min (ref >60.00)
Glucose, Bld: 109 mg/dL — ABNORMAL HIGH (ref 70–99)
Potassium: 4.6 mEq/L (ref 3.5–5.1)
Sodium: 138 mEq/L (ref 135–145)

## 2019-09-19 MED ORDER — LEVOFLOXACIN 750 MG PO TABS: 750.0000 mg | ORAL_TABLET | Freq: Every day | ORAL | 0 refills | Status: DC

## 2019-09-19 MED ORDER — CEFTRIAXONE SODIUM 1 G IJ SOLR
1.0000 g | Freq: Once | INTRAMUSCULAR | Status: AC
  Administered 2019-09-19: 1 g via INTRAMUSCULAR

## 2019-09-19 MED ORDER — ONDANSETRON HCL 4 MG PO TABS: 4.0000 mg | ORAL_TABLET | Freq: Three times a day (TID) | ORAL | 0 refills | Status: DC | PRN

## 2019-09-19 NOTE — Progress Notes (Signed)
This visit occurred during the SARS-CoV-2 public health emergency.  Safety protocols were in place, including screening questions prior to the visit, additional usage of staff PPE, and extensive cleaning of exam room while observing appropriate contact time as indicated for disinfecting solutions.    [name] , 1955-12-11, 64 y.o., female MRN: [id number] Patient Care Team    Relationship Specialty Notifications Start End  Ma Hillock, DO PCP - General Family Medicine  04/23/15   Volanda Napoleon, MD Consulting Physician Oncology  05/12/13   Maisie Fus, MD Consulting Physician Obstetrics and Gynecology  01/01/15   Chesley Mires, MD Consulting Physician Pulmonary Disease  02/22/16   Carolan Clines, MD (Inactive) Consulting Physician Urology  04/21/16   Ayesha Mohair  Dermatology  07/04/16    Comment: [facility], Olevia Bowens, DO Consulting Physician Sports Medicine  05/17/18   Plonk, Noni Saupe, PA-C  Physician Assistant  05/25/18     Chief Complaint  Patient presents with  . Flank Pain    Pt was advised Friday to go to ED but did not.  . Fever  . painful urination     Subjective: Pt presents for an OV with complaints of dysuria  of 4 days duration.  Associated symptoms include left flank pain.  Patient reports a fever of 102.3 F on Saturday.  She has had a decreased appetite, but denies vomiting or chills.  She had a urinary tract infection 3 months ago that was pansensitive E. coli.  She had a diverticular flare in April.  She reports that she did take 2 Macrobid that she had of a family members prescription.  She has a history of hematuria, UTI and pyelonephritis.  She also has history of diverticulitis.  She has been established with urology in the past with a cystoscopy by Dr. Gaynelle Arabian in 2017.  She is an everyday  smoker.  She is uncertain if she has a history of kidney stones, CT abdomen 03/16/2019 did not show evidence of  kidney stones at that time.  Depression screen [facility] 2/9 10/28/2017 08/10/2017  Decreased Interest 0 0  Down, Depressed, Hopeless 0 0  PHQ - 2 Score 0 0  Some recent data might be hidden    No Known Allergies Social History   Social History Narrative  . Not on file   Past Medical History:  Diagnosis Date  . Arthritis   . Basal cell carcinoma   . COPD (chronic obstructive pulmonary disease) ([place])   . DVT (deep venous thrombosis) ([place])   . History of acute bronchitis 03/24/2011  . Kidney stones   . Lymphadenopathy, abdominal 03/2013   Noted on abd/pelv CT done to eval slow-to-resolve pyelo  . Meniere disorder 01/23/2012  . Osteoporosis 12/2014   Dr. Nori Riis, her GYN, dx'd her  . Pneumonia   . Recurrent pyelonephritis   . Seasonal allergic rhinitis   . Squamous cell carcinoma in situ (SCCIS) of skin of lower leg 05/04/2018   Dr. Janan Ridge, [facility]   Past Surgical History:  Procedure Laterality Date  . DEXA  12/2014   T score spine -2.4, hip -3.5 (Dr. Nori Riis, [facility].  [name] DILATION AND CURETTAGE OF UTERUS  2009  . SKIN LESION EXCISION Left 05/24/2018   SCC; Skin, Excision, Left La[place] leg: postsurgical scar, no neoplasm is identified.  Skin, Shave bx and ED&C, right anterior thigh-Superficially invasive squamous cell carcinoma, extending  to the deep margin.   Family History  Problem Relation Age of Onset  . Hypertension Mother   . Glaucoma Mother   . Irritable bowel syndrome Mother   . Arthritis Mother   . Kidney disease Mother   . Diabetes Mother   . Hypertension Father   . Heart disease Father        bypasses  . Cancer Father        sarcoma  . Heart disease Maternal Grandmother        CHF  . Stroke Maternal Grandmother        in his 51's  . Breast cancer Maternal Grandmother   . Stroke Maternal Grandfather   . Other Paternal Grandmother        hardening of the arteries  . Heart attack Paternal Grandfather   . Colon cancer  Paternal Aunt 76  . Stomach cancer Neg Hx    Allergies as of 09/19/2019   No Known Allergies     Medication List       Accurate as of Sep 19, 2019  4:59 PM. If you have any questions, ask your nurse or doctor.        STOP taking these medications   fluorouracil 5 % cream Commonly known as: EFUDEX Stopped by: Howard Pouch, DO   gabapentin 100 MG capsule Commonly known as: NEURONTIN Stopped by: Howard Pouch, DO   metroNIDAZOLE 500 MG tablet Commonly known as: FLAGYL Stopped by: Howard Pouch, DO     TAKE these medications   AeroChamber MV inhaler Use as instructed   albuterol (2.5 MG/3ML) 0.083% nebulizer solution Commonly known as: PROVENTIL Take 3 mLs (2.5 mg total) by nebulization every 6 (six) hours as needed for wheezing or shortness of breath.   Ventolin HFA 108 (90 Base) MCG/ACT inhaler Generic drug: albuterol TAKE 2 PUFFS BY MOUTH EVERY 6 HOURS AS NEEDED FOR WHEEZE OR SHORTNESS OF BREATH   alendronate 70 MG tablet Commonly known as: FOSAMAX Take 1 tablet (70 mg total) by mouth every 7 (seven) days. Take with a full glass of water on an empty stomach.   azelastine 0.1 % nasal spray Commonly known as: ASTELIN PLACE 1 SPRAY INTO BOTH NOSTRILS 2 (TWO) TIMES DAILY. USE IN EACH NOSTRIL AS DIRECTED   Flutter Devi Twice a day and prn as needed, may increase if feeling worse   levofloxacin 750 MG tablet Commonly known as: Levaquin Take 1 tablet (750 mg total) by mouth daily.   montelukast 10 MG tablet Commonly known as: SINGULAIR Take 1 tablet (10 mg total) by mouth at bedtime.   multivitamin tablet Take 1 tablet by mouth daily.   ondansetron 4 MG tablet Commonly known as: ZOFRAN Take 1 tablet (4 mg total) by mouth every 8 (eight) hours as needed for nausea or vomiting. Started by: Howard Pouch, DO   Trelegy Ellipta 100-62.5-25 MCG/INH Aepb Generic drug: Fluticasone-Umeclidin-Vilant Inhale 1 puff into the lungs daily.   tretinoin 0.05 %  cream Commonly known as: RETIN-A APPLY TO AFFECTED AREA EVERY DAY AT BEDTIME **PA SENT**   VITAMIN D (CHOLECALCIFEROL) PO Take 5,000 Units by mouth daily.       All past medical history, surgical history, allergies, family history, immunizations andmedications were updated in the EMR today and reviewed under the history and medication portions of their EMR.     ROS: Negative, with the exception of above mentioned in HPI   Objective:  BP (!) 147/96 (BP Location: Right Arm, Patient Position: Sitting,  Cuff Size: Normal)   Pulse (!) 110   Temp 98 F (36.7 C) (Temporal)   Resp 18   Ht 5\' 5"  (1.651 m)   Wt 108 lb (49 kg)   SpO2 99%   BMI 17.97 kg/m  Body mass index is 17.97 kg/m. Gen: Afebrile. No acute distress. Nontoxic in appearance, well developed, well nourished.  HENT: AT. [place].  Eyes:Pupils Equal Round Reactive to light, Extraocular movements intact,  Conjunctiva without redness, discharge or icterus. CV: Tachycardic today. Chest: CTAB, no wheeze or crackles. Good air movement, normal resp effort.  Abd: Soft.  Flat, tenderness to palpation deep left flank, nondistended.  Bowel sounds present.  No masses palpated.  No rebound or guarding. MSK: No CVA tenderness bilaterally. Skin: No rashes, purpura or petechiae.  Neuro: Normal gait. PERLA. EOMi. Alert. Oriented x3  Psych: Normal affect, dress and demeanor. Normal speech. Normal thought content and judgment.  No exam data present No results found. Results for orders placed or performed in visit on 09/19/19 (from the past 24 hour(s))  POCT urinalysis dipstick     Status: Abnormal   Collection Time: 09/19/19 11:35 AM  Result Value Ref Range   Color, UA yellow    Clarity, UA cloudy    Glucose, UA Negative Negative   Bilirubin, UA negative    Ketones, UA negative    Spec Grav, UA 1.015 1.010 - 1.025   Blood, UA 3+    pH, UA 6.0 5.0 - 8.0   Protein, UA Positive (A) Negative   Urobilinogen, UA 1.0 0.2 or 1.0 E.U./dL    Nitrite, UA positive    Leukocytes, UA 4+ (A) Negative   Appearance     Odor    Basic Metabolic Panel (BMET)     Status: Abnormal   Collection Time: 09/19/19 12:02 PM  Result Value Ref Range   Sodium 138 135 - 145 mEq/L   Potassium 4.6 3.5 - 5.1 mEq/L   Chloride 101 96 - 112 mEq/L   CO2 31 19 - 32 mEq/L   Glucose, Bld 109 (H) 70 - 99 mg/dL   BUN 10 6 - 23 mg/dL   Creatinine, Ser 0.60 0.40 - 1.20 mg/dL   GFR 100.82 >60.00 mL/min   Calcium 9.1 8.4 - 10.5 mg/dL  CBC w/Diff     Status: Abnormal   Collection Time: 09/19/19 12:02 PM  Result Value Ref Range   WBC 8.9 4.0 - 10.5 K/uL   RBC 4.66 3.87 - 5.11 Mil/uL   Hemoglobin 15.6 (H) 12.0 - 15.0 g/dL   HCT 46.2 (H) 36.0 - 46.0 %   MCV 99.2 78.0 - 100.0 fl   MCHC 33.8 30.0 - 36.0 g/dL   RDW 14.2 11.5 - 15.5 %   Platelets 179.0 150.0 - 400.0 K/uL   Neutrophils Relative % 60.4 43.0 - 77.0 %   Lymphocytes Relative 24.9 12.0 - 46.0 %   Monocytes Relative 12.8 (H) 3.0 - 12.0 %   Eosinophils Relative 1.3 0.0 - 5.0 %   Basophils Relative 0.6 0.0 - 3.0 %   Neutro Abs 5.4 1.4 - 7.7 K/uL   Lymphs Abs 2.2 0.7 - 4.0 K/uL   Monocytes Absolute 1.1 (H) 0.1 - 1.0 K/uL   Eosinophils Absolute 0.1 0.0 - 0.7 K/uL   Basophils Absolute 0.1 0.0 - 0.1 K/uL    Assessment/Plan: LANNIE AASE is a 64 y.o. female present for OV for  Flank pain/history of frequent/recurrent pyelonephritis/UTI/everyday smoker/hematuria Concern for either kidney stone versus  pyelonephritis.  Will obtain labs today.  Start antibiotics. - POCT urinalysis dipstick - Urinalysis w microscopic + reflex cultur -Rocephin 1 g IM completed today.  Patient to start Levaquin this evening.  Zofran prescribed for nausea. - Basic Metabolic Panel (BMET) - CBC w/Diff - Ambulatory referral to Urology> referral placed today.  Prior patient of Dr. Gaynelle Arabian, last seen 2017.  With recurrent hematuria, UTI and pyelonephritis with her history of smoking, she should be further evaluated by  urology.  She is in agreement with this today. Patient counseled on reporting to the emergency room if nausea, vomiting, fever persists or pain worsens.  It has been explained to her the possibility of urosepsis needing emergent medical intervention and IV antibiotics in order to treat.  Patient reports understanding.   Reviewed expectations re: course of current medical issues.  Discussed self-management of symptoms.  Outlined signs and symptoms indicating need for more acute intervention.  Patient verbalized understanding and all questions were answered.  Patient received an After-Visit Summary.    Orders Placed This Encounter  Procedures  . Urinalysis w microscopic + reflex cultur  . Basic Metabolic Panel (BMET)  . CBC w/Diff  . Ambulatory referral to Urology  . POCT urinalysis dipstick   Meds ordered this encounter  Medications  . levofloxacin (LEVAQUIN) 750 MG tablet    Sig: Take 1 tablet (750 mg total) by mouth daily.    Dispense:  7 tablet    Refill:  0  . ondansetron (ZOFRAN) 4 MG tablet    Sig: Take 1 tablet (4 mg total) by mouth every 8 (eight) hours as needed for nausea or vomiting.    Dispense:  20 tablet    Refill:  0  . cefTRIAXone (ROCEPHIN) injection 1 g    Referral Orders     Ambulatory referral to Urology   Note is dictated utilizing voice recognition software. Although note has been proof read prior to signing, occasional typographical errors still can be missed. If any questions arise, please do not hesitate to call for verification.   electronically signed by:  Howard Pouch, DO  [place]

## 2019-09-19 NOTE — Patient Instructions (Signed)
followup in 2-3 weeks for BP recheck and urine recheck.    I have referred you to urology also.   Rocephin today and start Levaquin tonight.  zofran prescribed for nausea.   If symptoms are worsening> go to ED for IV abx. And testing.     Pyelonephritis, Adult  Pyelonephritis is an infection that occurs in the kidney. The kidneys are organs that help clean the blood by moving waste out of the blood and into the pee (urine). This infection can happen quickly, or it can last for a long time. In most cases, it clears up with treatment and does not cause other problems. What are the causes? This condition may be caused by:  Germs (bacteria) going from the bladder up to the kidney. This may happen after having a bladder infection.  Germs going from the blood to the kidney. What increases the risk? This condition is more likely to develop in:  Pregnant women.  Older people.  People who have any of these conditions: ? Diabetes. ? Inflammation of the prostate gland (prostatitis), in males. ? Kidney stones or bladder stones. ? Other problems with the kidney or the parts of your body that carry pee from the kidneys to the bladder (ureters). ? Cancer.  People who have a small, thin tube (catheter) placed in the bladder.  People who are sexually active.  Women who use a medicine that kills sperm (spermicide) to prevent pregnancy.  People who have had a prior urinary tract infection (UTI). What are the signs or symptoms? Symptoms of this condition include:  Peeing often.  A strong urge to pee right away.  Burning or stinging when peeing.  Belly pain.  Back pain.  Pain in the side (flank area).  Fever or chills.  Blood in the pee, or dark pee.  Feeling sick to your stomach (nauseous) or throwing up (vomiting). How is this treated? This condition may be treated by:  Taking antibiotic medicines by mouth (orally).  Drinking enough fluids. If the infection is bad, you  may need to stay in the hospital. You may be given antibiotics and fluids that are put directly into a vein through an IV tube. In some cases, other treatments may be needed. Follow these instructions at home: Medicines  Take your antibiotic medicine as told by your doctor. Do not stop taking the antibiotic even if you start to feel better.  Take over-the-counter and prescription medicines only as told by your doctor. General instructions   Drink enough fluid to keep your pee pale yellow.  Avoid caffeine, tea, and carbonated drinks.  Pee (urinate) often. Avoid holding in pee for long periods of time.  Pee before and after sex.  After pooping (having a bowel movement), women should wipe from front to back. Use each tissue only once.  Keep all follow-up visits as told by your doctor. This is important. Contact a doctor if:  You do not feel better after 2 days.  Your symptoms get worse.  You have a fever. Get help right away if:  You cannot take your medicine or drink fluids as told.  You have chills and shaking.  You throw up.  You have very bad pain in your side or back.  You feel very weak or you pass out (faint). Summary  Pyelonephritis is an infection that occurs in the kidney.  In most cases, this infection clears up with treatment and does not cause other problems.  Take your antibiotic medicine as told  by your doctor. Do not stop taking the antibiotic even if you start to feel better.  Drink enough fluid to keep your pee pale yellow. This information is not intended to replace advice given to you by your health care provider. Make sure you discuss any questions you have with your health care provider. Document Revised: 02/23/2018 Document Reviewed: 02/23/2018 Elsevier Patient Education  2020 [employer].

## 2019-09-22 ENCOUNTER — Telehealth: Payer: Self-pay | Admitting: Family Medicine

## 2019-09-22 LAB — URINE CULTURE
MICRO NUMBER:: 10489680
SPECIMEN QUALITY:: ADEQUATE

## 2019-09-22 LAB — URINALYSIS W MICROSCOPIC + REFLEX CULTURE
Bilirubin Urine: NEGATIVE
Glucose, UA: NEGATIVE
Hyaline Cast: NONE SEEN /LPF
Nitrites, Initial: NEGATIVE
Specific Gravity, Urine: 1.02 (ref 1.001–1.03)
Squamous Epithelial / HPF: NONE SEEN /HPF (ref <=5)
pH: 6 (ref 5.0–8.0)

## 2019-09-22 LAB — CULTURE INDICATED

## 2019-09-22 MED ORDER — CEFDINIR 300 MG PO CAPS: 300.0000 mg | ORAL_CAPSULE | Freq: Two times a day (BID) | ORAL | 0 refills | Status: DC

## 2019-09-22 NOTE — Telephone Encounter (Signed)
Please inform patient Her urine culture grew a bacteria called Klebsiella pneumonia.  She is not had a UTI from this particular bacteria in the past.  This bacteria is sensitive to Rocephin and like medications, but not fluoroquinolones> which is what we covered her with going by her prior UTIs being sensitive to them.. We will need to change her antibiotic ASAP.  I have called in cefdinir 1 tab every 12 hours.  I have called in the extended course of 14 days-since she was reporting significant fever with this UTI went to give her adequate coverage for kidney infection.  If still having fevers, chills, decreased urine output or increased pain she will need to be seen emergently in the emergency room for evaluation of sepsis and need of IV antibiotics.

## 2019-09-22 NOTE — Telephone Encounter (Signed)
Patient returned call and advised of results. Will pick up medication today and start

## 2019-09-22 NOTE — Telephone Encounter (Signed)
Tried calling patient's home and cell number more than once with no success.

## 2019-09-22 NOTE — Telephone Encounter (Signed)
LM for pt to returncall

## 2019-09-26 ENCOUNTER — Encounter: Payer: Self-pay | Admitting: Family Medicine

## 2019-09-28 NOTE — Telephone Encounter (Signed)
Pt wrote my chart message asking if the abx you RX would take care of wound infections she might have. I told her to reach out to her Dermatologist. Please advise.

## 2019-10-20 DIAGNOSIS — R311 Benign essential microscopic hematuria: Secondary | ICD-10-CM | POA: Diagnosis not present

## 2019-10-20 DIAGNOSIS — R8271 Bacteriuria: Secondary | ICD-10-CM | POA: Diagnosis not present

## 2019-11-15 DIAGNOSIS — R3121 Asymptomatic microscopic hematuria: Secondary | ICD-10-CM | POA: Diagnosis not present

## 2019-11-15 DIAGNOSIS — R8271 Bacteriuria: Secondary | ICD-10-CM | POA: Diagnosis not present

## 2019-11-15 DIAGNOSIS — N3021 Other chronic cystitis with hematuria: Secondary | ICD-10-CM | POA: Diagnosis not present

## 2019-11-15 DIAGNOSIS — N952 Postmenopausal atrophic vaginitis: Secondary | ICD-10-CM | POA: Diagnosis not present

## 2019-11-21 ENCOUNTER — Encounter: Payer: Self-pay | Admitting: Family Medicine

## 2019-11-21 ENCOUNTER — Other Ambulatory Visit: Payer: Self-pay

## 2019-11-21 ENCOUNTER — Telehealth (INDEPENDENT_AMBULATORY_CARE_PROVIDER_SITE_OTHER): Payer: BC Managed Care – PPO | Admitting: Family Medicine

## 2019-11-21 VITALS — HR 94 | Temp 99.8°F | Ht 64.5 in | Wt 107.0 lb

## 2019-11-21 DIAGNOSIS — J441 Chronic obstructive pulmonary disease with (acute) exacerbation: Secondary | ICD-10-CM | POA: Diagnosis not present

## 2019-11-21 MED ORDER — PREDNISONE 20 MG PO TABS: ORAL_TABLET | ORAL | 0 refills | Status: DC

## 2019-11-21 MED ORDER — ALBUTEROL SULFATE HFA 108 (90 BASE) MCG/ACT IN AERS: 1.0000 | INHALATION_SPRAY | RESPIRATORY_TRACT | 5 refills | Status: DC | PRN

## 2019-11-21 MED ORDER — LEVOFLOXACIN 750 MG PO TABS: 750.0000 mg | ORAL_TABLET | Freq: Every day | ORAL | 0 refills | Status: DC

## 2019-11-21 NOTE — Progress Notes (Signed)
VIRTUAL VISIT VIA VIDEO  I connected with [name] on 11/21/19 at 11:15 AM EDT by a video enabled telemedicine application and verified that I am speaking with the correct person using two identifiers. Location patient: Home Location provider: [facility], Office Persons participating in the virtual visit: Patient, Dr. Raoul Pitch and Mathis Bud, CMA  I discussed the limitations of evaluation and management by telemedicine and the availability of in person appointments. The patient expressed understanding and agreed to proceed.  [name] , 1956-02-14, 64 y.o., female MRN: [id number] Patient Care Team    Relationship Specialty Notifications Start End  Ma Hillock, DO PCP - General Family Medicine  04/23/15   Volanda Napoleon, MD Consulting Physician Oncology  05/12/13   Maisie Fus, MD Consulting Physician Obstetrics and Gynecology  01/01/15   Chesley Mires, MD Consulting Physician Pulmonary Disease  02/22/16   Carolan Clines, MD (Inactive) Consulting Physician Urology  04/21/16   Ayesha Mohair  Dermatology  07/04/16    Comment: [facility], Olevia Bowens, DO Consulting Physician Sports Medicine  05/17/18   Plonk, Noni Saupe, PA-C  Physician Assistant  05/25/18     Chief Complaint  Patient presents with  . Nasal Congestion    and chest x 5 days started on Levaquin   . Fever    OTC meds help   . Cough    Productive yellow      Subjective: Pt presents for an OV with complaints of nasal congestion, fever and productive cough of 5 days  duration.  Associated symptoms include feeling tired and winded.  She has a history of recurrent upper respiratory infections and COPD. She follows routinely with pulmonology. She is UTD with PNA23 and She has had her COVID vaccine series (moderna). Pt has tried OTC nsaids to ease their symptoms. She started LQ 3 days ago.   Depression screen [facility] 2/9 11/21/2019 10/28/2017 08/10/2017    Decreased Interest 0 0 0  Down, Depressed, Hopeless 0 0 0  PHQ - 2 Score 0 0 0  Some recent data might be hidden    No Known Allergies Social History   Social History Narrative  . Not on file   Past Medical History:  Diagnosis Date  . Arthritis   . Basal cell carcinoma   . COPD (chronic obstructive pulmonary disease) ([place])   . DVT (deep venous thrombosis) ([place])   . History of acute bronchitis 03/24/2011  . Kidney stones   . Lymphadenopathy, abdominal 03/2013   Noted on abd/pelv CT done to eval slow-to-resolve pyelo  . Meniere disorder 01/23/2012  . Osteoporosis 12/2014   Dr. Nori Riis, her GYN, dx'd her  . Pneumonia   . Recurrent pyelonephritis   . Seasonal allergic rhinitis   . Squamous cell carcinoma in situ (SCCIS) of skin of lower leg 05/04/2018   Dr. Janan Ridge, [facility]   Past Surgical History:  Procedure Laterality Date  . DEXA  12/2014   T score spine -2.4, hip -3.5 (Dr. Nori Riis, [facility].  [name] DILATION AND CURETTAGE OF UTERUS  2009  . SKIN LESION EXCISION Left 05/24/2018   SCC; Skin, Excision, Left Laeral leg: postsurgical scar, no neoplasm is identified.  Skin, Shave bx and ED&C, right anterior thigh-Superficially invasive squamous cell carcinoma, extending to the deep margin.   Family History  Problem Relation Age of Onset  . Hypertension Mother   . Glaucoma  Mother   . Irritable bowel syndrome Mother   . Arthritis Mother   . Kidney disease Mother   . Diabetes Mother   . Hypertension Father   . Heart disease Father        bypasses  . Cancer Father        sarcoma  . Heart disease Maternal Grandmother        CHF  . Stroke Maternal Grandmother        in his 68's  . Breast cancer Maternal Grandmother   . Stroke Maternal Grandfather   . Other Paternal Grandmother        hardening of the arteries  . Heart attack Paternal Grandfather   . Colon cancer Paternal Aunt 98  . Stomach cancer Neg Hx    Allergies as of 11/21/2019    No Known Allergies     Medication List       Accurate as of November 21, 2019 10:06 AM. If you have any questions, ask your nurse or doctor.        STOP taking these medications   cefdinir 300 MG capsule Commonly known as: OMNICEF Stopped by: Howard Pouch, DO   ondansetron 4 MG tablet Commonly known as: ZOFRAN Stopped by: Howard Pouch, DO     TAKE these medications   AeroChamber MV inhaler Use as instructed   albuterol (2.5 MG/3ML) 0.083% nebulizer solution Commonly known as: PROVENTIL Take 3 mLs (2.5 mg total) by nebulization every 6 (six) hours as needed for wheezing or shortness of breath. What changed: Another medication with the same name was added. Make sure you understand how and when to take each. Changed by: Howard Pouch, DO   Ventolin HFA 108 (90 Base) MCG/ACT inhaler Generic drug: albuterol TAKE 2 PUFFS BY MOUTH EVERY 6 HOURS AS NEEDED FOR WHEEZE OR SHORTNESS OF BREATH What changed: Another medication with the same name was added. Make sure you understand how and when to take each. Changed by: Howard Pouch, DO   albuterol 108 (90 Base) MCG/ACT inhaler Commonly known as: VENTOLIN HFA Inhale 1-2 puffs into the lungs every 4 (four) hours as needed for wheezing or shortness of breath. What changed: You were already taking a medication with the same name, and this prescription was added. Make sure you understand how and when to take each. Changed by: Howard Pouch, DO   alendronate 70 MG tablet Commonly known as: FOSAMAX Take 1 tablet (70 mg total) by mouth every 7 (seven) days. Take with a full glass of water on an empty stomach.   azelastine 0.1 % nasal spray Commonly known as: ASTELIN PLACE 1 SPRAY INTO BOTH NOSTRILS 2 (TWO) TIMES DAILY. USE IN EACH NOSTRIL AS DIRECTED   Flutter Devi Twice a day and prn as needed, may increase if feeling worse   levofloxacin 750 MG tablet Commonly known as: Levaquin Take 1 tablet (750 mg total) by mouth daily.    montelukast 10 MG tablet Commonly known as: SINGULAIR Take 1 tablet (10 mg total) by mouth at bedtime.   multivitamin tablet Take 1 tablet by mouth daily.   predniSONE 20 MG tablet Commonly known as: DELTASONE 60 mg x3d, 40 mg x3d, 20 mg x2d, 10 mg x2d Started by: Howard Pouch, DO   Trelegy Ellipta 100-62.5-25 MCG/INH Aepb Generic drug: Fluticasone-Umeclidin-Vilant Inhale 1 puff into the lungs daily.   tretinoin 0.05 % cream Commonly known as: RETIN-A APPLY TO AFFECTED AREA EVERY DAY AT BEDTIME **PA SENT**   VITAMIN D (CHOLECALCIFEROL)  PO Take 5,000 Units by mouth daily.       All past medical history, surgical history, allergies, family history, immunizations andmedications were updated in the EMR today and reviewed under the history and medication portions of their EMR.     ROS: Negative, with the exception of above mentioned in HPI   Objective:  Pulse 94   Temp 99.8 F (37.7 C) (Temporal)   Ht 5' 4.5" (1.638 m)   Wt 107 lb (48.5 kg)   BMI 18.08 kg/m  Body mass index is 18.08 kg/m. Gen: Afebrile. No acute distress. Appears mildly winded with ambulation during visit.  HENT: AT. [place].  Eyes:Pupils Equal Round Reactive to light, Extraocular movements intact,  Conjunctiva without redness, discharge or icterus. Chest: mild cough present during visit.  Neuro:  Alert. Oriented.[name] Psych: Normal affect, dress and demeanor. Normal speech. Normal thought content and judgment.    No exam data present No results found. No results found for this or any previous visit (from the past 24 hour(s)).  Assessment/Plan: SEMYA KLINKE is a 64 y.o. female present for OV for  COPD exacerbation (HCC)/fever Rest, hydrate.   mucinex (DM if cough), nettie pot or nasal saline.  LQ (additional 4 days- has taken 3 days) and prednisone prescribed, take until completed.  albuterol q 4 prn F/U 2 weeks of not improved, sooner if worsening.     Reviewed expectations re: course of current  medical issues.  Discussed self-management of symptoms.  Outlined signs and symptoms indicating need for more acute intervention.  Patient verbalized understanding and all questions were answered.  Patient received an After-Visit Summary.    No orders of the defined types were placed in this encounter.  Meds ordered this encounter  Medications  . levofloxacin (LEVAQUIN) 750 MG tablet    Sig: Take 1 tablet (750 mg total) by mouth daily.    Dispense:  4 tablet    Refill:  0  . albuterol (VENTOLIN HFA) 108 (90 Base) MCG/ACT inhaler    Sig: Inhale 1-2 puffs into the lungs every 4 (four) hours as needed for wheezing or shortness of breath.    Dispense:  8 g    Refill:  5  . predniSONE (DELTASONE) 20 MG tablet    Sig: 60 mg x3d, 40 mg x3d, 20 mg x2d, 10 mg x2d    Dispense:  21 tablet    Refill:  0   Referral Orders  No referral(s) requested today     Note is dictated utilizing voice recognition software. Although note has been proof read prior to signing, occasional typographical errors still can be missed. If any questions arise, please do not hesitate to call for verification.   electronically signed by:  Howard Pouch, DO  [place]

## 2019-11-28 ENCOUNTER — Other Ambulatory Visit: Payer: Self-pay

## 2019-11-28 ENCOUNTER — Ambulatory Visit (HOSPITAL_BASED_OUTPATIENT_CLINIC_OR_DEPARTMENT_OTHER)
Admission: RE | Admit: 2019-11-28 | Discharge: 2019-11-28 | Disposition: A | Discharge status: Home / Self Care | Payer: BC Managed Care – PPO | Source: Ambulatory Visit | Attending: Family Medicine | Admitting: Family Medicine

## 2019-11-28 ENCOUNTER — Telehealth: Payer: Self-pay

## 2019-11-28 DIAGNOSIS — J440 Chronic obstructive pulmonary disease with acute lower respiratory infection: Secondary | ICD-10-CM | POA: Insufficient documentation

## 2019-11-28 DIAGNOSIS — J209 Acute bronchitis, unspecified: Secondary | ICD-10-CM

## 2019-11-28 DIAGNOSIS — R05 Cough: Secondary | ICD-10-CM | POA: Diagnosis not present

## 2019-11-28 DIAGNOSIS — R0602 Shortness of breath: Secondary | ICD-10-CM | POA: Diagnosis not present

## 2019-11-28 NOTE — Telephone Encounter (Signed)
Please place patient in the 8:00 slot tomorrow morning.  If unable to get a hold of her p.m. place in the 4 PM slot and have her get a chest x-ray first thing in the morning tomorrow so that we may have the results by her 4:00 appointment. Order has been placed

## 2019-11-28 NOTE — Telephone Encounter (Signed)
Pt was called and given information. She is scheduled for 8 am tomorrow with Dr Raliegh Ip

## 2019-11-28 NOTE — Addendum Note (Signed)
Addended by: Howard Pouch A on: 11/28/2019 04:56 PM   Modules accepted: Orders

## 2019-11-28 NOTE — Telephone Encounter (Signed)
Is not feeling any better from last week.  Is requesting to have a chest xray done today OR tomorrow at [facility].  Patient can be reached at [phone] (cell)

## 2019-11-28 NOTE — Telephone Encounter (Signed)
Pt was to F/U in two weeks if not any better per OV note from 11/21/2019. If pt needs to come for appt Please advise if we can work pt in tomorrow at [facility] [employer] [employer] or another day/time.   Thanks

## 2019-11-29 ENCOUNTER — Encounter: Payer: Self-pay | Admitting: Family Medicine

## 2019-11-29 ENCOUNTER — Ambulatory Visit (INDEPENDENT_AMBULATORY_CARE_PROVIDER_SITE_OTHER): Payer: BC Managed Care – PPO | Admitting: Family Medicine

## 2019-11-29 DIAGNOSIS — R062 Wheezing: Secondary | ICD-10-CM | POA: Diagnosis not present

## 2019-11-29 MED ORDER — PREDNISONE 20 MG PO TABS: ORAL_TABLET | ORAL | 0 refills | Status: DC

## 2019-11-29 MED ORDER — METHYLPREDNISOLONE ACETATE 80 MG/ML IJ SUSP
80.0000 mg | Freq: Once | INTRAMUSCULAR | Status: AC
  Administered 2019-11-29: 80 mg via INTRAMUSCULAR

## 2019-11-29 MED ORDER — AZITHROMYCIN 250 MG PO TABS: ORAL_TABLET | ORAL | 0 refills | Status: DC

## 2019-11-29 MED ORDER — ALBUTEROL SULFATE (2.5 MG/3ML) 0.083% IN NEBU: 2.5000 mg | INHALATION_SOLUTION | Freq: Four times a day (QID) | RESPIRATORY_TRACT | 1 refills | Status: DC | PRN

## 2019-11-29 NOTE — Progress Notes (Signed)
[name] , 1955-07-12, 64 y.o., female MRN: [id number] Patient Care Team    Relationship Specialty Notifications Start End  Ma Hillock, DO PCP - General Family Medicine  04/23/15   Volanda Napoleon, MD Consulting Physician Oncology  05/12/13   Maisie Fus, MD Consulting Physician Obstetrics and Gynecology  01/01/15   Chesley Mires, MD Consulting Physician Pulmonary Disease  02/22/16   Carolan Clines, MD (Inactive) Consulting Physician Urology  04/21/16   Ayesha Mohair  Dermatology  07/04/16    Comment: [facility], Olevia Bowens, DO Consulting Physician Sports Medicine  05/17/18   Plonk, Noni Saupe, PA-C  Physician Assistant  05/25/18     Chief Complaint  Patient presents with  . Shortness of Breath     Subjective: Pt presents for an OV with complaints of continued shortness of breath. She is no longer having fevers. She was seen last week and treated with LQ and prednisone. She reports she is feeling better, but symptoms are not resolved. She reports she does not recall feeling this much wheezing in the past w/ her COPD flares. We obtained a pre-appt cxr this morning, which resulted COPD without infiltrate. She is established with pulm. She has had her covid series. She is not routinely using albuterol inhaler. She is taking the daily inhaler as directed.  Prior note: nasal congestion, fever and productive cough of 5 days  duration.  Associated symptoms include feeling tired and winded.  She has a history of recurrent upper respiratory infections and COPD. She follows routinely with pulmonology. She is UTD with PNA23 and She has had her COVID vaccine series (moderna). Pt has tried OTC nsaids to ease their symptoms. She started LQ 3 days ago.   Depression screen [facility] 2/9 11/21/2019 10/28/2017 08/10/2017  Decreased Interest 0 0 0  Down, Depressed, Hopeless 0 0 0  PHQ - 2 Score 0 0 0  Some recent data might be hidden    No Known  Allergies Social History   Social History Narrative  . Not on file   Past Medical History:  Diagnosis Date  . Arthritis   . Basal cell carcinoma   . COPD (chronic obstructive pulmonary disease) ([place])   . DVT (deep venous thrombosis) ([place])   . History of acute bronchitis 03/24/2011  . Kidney stones   . Lymphadenopathy, abdominal 03/2013   Noted on abd/pelv CT done to eval slow-to-resolve pyelo  . Meniere disorder 01/23/2012  . Osteoporosis 12/2014   Dr. Nori Riis, her GYN, dx'd her  . Pneumonia   . Recurrent pyelonephritis   . Seasonal allergic rhinitis   . Squamous cell carcinoma in situ (SCCIS) of skin of lower leg 05/04/2018   Dr. Janan Ridge, [facility]   Past Surgical History:  Procedure Laterality Date  . DEXA  12/2014   T score spine -2.4, hip -3.5 (Dr. Nori Riis, [facility].  [name] DILATION AND CURETTAGE OF UTERUS  2009  . SKIN LESION EXCISION Left 05/24/2018   SCC; Skin, Excision, Left Laeral leg: postsurgical scar, no neoplasm is identified.  Skin, Shave bx and ED&C, right anterior thigh-Superficially invasive squamous cell carcinoma, extending to the deep margin.   Family History  Problem Relation Age of Onset  . Hypertension Mother   . Glaucoma Mother   . Irritable bowel syndrome Mother   . Arthritis Mother   . Kidney disease Mother   . Diabetes Mother   .  Hypertension Father   . Heart disease Father        bypasses  . Cancer Father        sarcoma  . Heart disease Maternal Grandmother        CHF  . Stroke Maternal Grandmother        in his 75's  . Breast cancer Maternal Grandmother   . Stroke Maternal Grandfather   . Other Paternal Grandmother        hardening of the arteries  . Heart attack Paternal Grandfather   . Colon cancer Paternal Aunt 57  . Stomach cancer Neg Hx    Allergies as of 11/29/2019   No Known Allergies     Medication List       Accurate as of November 29, 2019  8:09 AM. If you have any questions, ask your  nurse or doctor.        AeroChamber MV inhaler Use as instructed   albuterol (2.5 MG/3ML) 0.083% nebulizer solution Commonly known as: PROVENTIL Take 3 mLs (2.5 mg total) by nebulization every 6 (six) hours as needed for wheezing or shortness of breath.   Ventolin HFA 108 (90 Base) MCG/ACT inhaler Generic drug: albuterol TAKE 2 PUFFS BY MOUTH EVERY 6 HOURS AS NEEDED FOR WHEEZE OR SHORTNESS OF BREATH   albuterol 108 (90 Base) MCG/ACT inhaler Commonly known as: VENTOLIN HFA Inhale 1-2 puffs into the lungs every 4 (four) hours as needed for wheezing or shortness of breath.   alendronate 70 MG tablet Commonly known as: FOSAMAX Take 1 tablet (70 mg total) by mouth every 7 (seven) days. Take with a full glass of water on an empty stomach.   azelastine 0.1 % nasal spray Commonly known as: ASTELIN PLACE 1 SPRAY INTO BOTH NOSTRILS 2 (TWO) TIMES DAILY. USE IN EACH NOSTRIL AS DIRECTED   Flutter Devi Twice a day and prn as needed, may increase if feeling worse   levofloxacin 750 MG tablet Commonly known as: Levaquin Take 1 tablet (750 mg total) by mouth daily.   montelukast 10 MG tablet Commonly known as: SINGULAIR Take 1 tablet (10 mg total) by mouth at bedtime.   multivitamin tablet Take 1 tablet by mouth daily.   predniSONE 20 MG tablet Commonly known as: DELTASONE 60 mg x3d, 40 mg x3d, 20 mg x2d, 10 mg x2d   Trelegy Ellipta 100-62.5-25 MCG/INH Aepb Generic drug: Fluticasone-Umeclidin-Vilant Inhale 1 puff into the lungs daily.   tretinoin 0.05 % cream Commonly known as: RETIN-A APPLY TO AFFECTED AREA EVERY DAY AT BEDTIME **PA SENT**   VITAMIN D (CHOLECALCIFEROL) PO Take 5,000 Units by mouth daily.       All past medical history, surgical history, allergies, family history, immunizations andmedications were updated in the EMR today and reviewed under the history and medication portions of their EMR.     ROS: Negative, with the exception of above mentioned in  HPI   Objective:  BP (!) 160/98 (BP Location: Left Arm, Patient Position: Sitting, Cuff Size: Normal)   Pulse 76   Temp 98.2 F (36.8 C) (Temporal)   Resp 18   Ht 5\' 5"  (1.651 m)   Wt 107 lb 2 oz (48.6 kg)   SpO2 93%   BMI 17.83 kg/m  Body mass index is 17.83 kg/m. Gen: Afebrile. No acute distress. Nontoxic. Spo2 93% on RA HENT: AT. [place]. Bilateral TM visualized and normal in appearance. MMM. Bilateral nares without erythema or swelling Throat without erythema or exudates. No cough.  Eyes:Pupils  Equal Round Reactive to light, Extraocular movements intact,  Conjunctiva without redness, discharge or icterus. Neck/lymp/endocrine: Supple,no lymphadenopathy CV: RRR no murmur, no edema Chest: wheezing present left lung fields and right lower lung fields.  Abd: Soft. NTND. BS present Skin: no rashes, purpura or petechiae.  Neuro:  Normal gait. PERLA. EOMi. Alert. Oriented x3  Psych: Normal affect, dress and demeanor. Normal speech. Normal thought content and judgment.    No exam data present DG Chest 2 View  Result Date: 11/28/2019 CLINICAL DATA:  Cough with increased short of breath EXAM: CHEST - 2 VIEW COMPARISON:  01/26/2019 FINDINGS: Hyperinflation with diffuse bronchitic changes, chronic. No consolidation or effusion. Stable cardiomediastinal silhouette. No pneumothorax. IMPRESSION: Hyperinflation with chronic bronchitic changes. No acute infiltrate. Electronically Signed   By: Donavan Foil M.D.   On: 11/28/2019 22:27   No results found for this or any previous visit (from the past 24 hour(s)).  Assessment/Plan: TIJA BISS is a 64 y.o. female present for OV for  COPD exacerbation ([place]) Rest, hydrate.   mucinex (DM if cough), nettie pot or nasal saline.  Completed LQ and prednisone taper> IM depo medrol injection today.  Azith for more coverage.  CXR without infiltrate Encouraged her to have COVID testing to be certain- since that would change follow up and possible  outcome.  albuterol q 4 -6 hrs today scheduled, then PRN She has an appt with pulm for follow up.     Reviewed expectations re: course of current medical issues.  Discussed self-management of symptoms.  Outlined signs and symptoms indicating need for more acute intervention.  Patient verbalized understanding and all questions were answered.  Patient received an After-Visit Summary.    No orders of the defined types were placed in this encounter.  No orders of the defined types were placed in this encounter.  Referral Orders  No referral(s) requested today     Note is dictated utilizing voice recognition software. Although note has been proof read prior to signing, occasional typographical errors still can be missed. If any questions arise, please do not hesitate to call for verification.   electronically signed by:  Howard Pouch, DO  [place]

## 2019-11-29 NOTE — Patient Instructions (Signed)
CXR is negative for pneumonia.   Take z-pack as directed on package.  Steroid shot today.   Prednisone taper called in if needed only after 3 days.   Albuterol neb or inhaler (2 puffs) every 4-6 hours as needed for wheeze or shortness of breath. I would suggest for at least today to take your albuterol every 4-6 hours scheduled and then after tomorrow take as needed.   If any worsening symptoms, please be seen. I would recommend you follow up with your pulmonologist if able.   I would also recommend you have a COVID test to be certain. Please let us know the results.

## 2019-12-01 ENCOUNTER — Ambulatory Visit: Payer: BC Managed Care – PPO | Admitting: Pulmonary Disease

## 2019-12-14 ENCOUNTER — Other Ambulatory Visit: Payer: Self-pay | Admitting: Pulmonary Disease

## 2019-12-20 ENCOUNTER — Ambulatory Visit (INDEPENDENT_AMBULATORY_CARE_PROVIDER_SITE_OTHER): Payer: BC Managed Care – PPO | Admitting: Pulmonary Disease

## 2019-12-20 ENCOUNTER — Other Ambulatory Visit: Payer: Self-pay

## 2019-12-20 ENCOUNTER — Encounter: Payer: Self-pay | Admitting: Pulmonary Disease

## 2019-12-20 VITALS — BP 110/80 | HR 91 | Temp 98.4°F | Ht 64.0 in | Wt 108.8 lb

## 2019-12-20 DIAGNOSIS — J411 Mucopurulent chronic bronchitis: Secondary | ICD-10-CM | POA: Diagnosis not present

## 2019-12-20 DIAGNOSIS — F17200 Nicotine dependence, unspecified, uncomplicated: Secondary | ICD-10-CM

## 2019-12-20 DIAGNOSIS — G4736 Sleep related hypoventilation in conditions classified elsewhere: Secondary | ICD-10-CM

## 2019-12-20 DIAGNOSIS — J479 Bronchiectasis, uncomplicated: Secondary | ICD-10-CM

## 2019-12-20 DIAGNOSIS — J470 Bronchiectasis with acute lower respiratory infection: Secondary | ICD-10-CM | POA: Insufficient documentation

## 2019-12-20 DIAGNOSIS — F17210 Nicotine dependence, cigarettes, uncomplicated: Secondary | ICD-10-CM | POA: Diagnosis not present

## 2019-12-20 DIAGNOSIS — J439 Emphysema, unspecified: Secondary | ICD-10-CM | POA: Diagnosis not present

## 2019-12-20 NOTE — Assessment & Plan Note (Signed)
Plan: Continue Trelegy Ellipta Sputum cups provided for patient today if she starts to have acute worsening symptoms Continue lung cancer screening program Follow-up in 3 months Emphasized need to stop smoking

## 2019-12-20 NOTE — Assessment & Plan Note (Addendum)
Plan: Emphasized need to stop smoking Continue lung cancer screening program

## 2019-12-20 NOTE — Assessment & Plan Note (Signed)
Recently treated with Levaquin and prednisone  Plan: Sputum cups provided today, can test sputum three-way's AFB, fungal, respiratory sputum if having acute worsening symptoms

## 2019-12-20 NOTE — Progress Notes (Signed)
@Patient  ID: [name], female    DOB: 12-10-1955, 64 y.o.   MRN: [id number]  Chief Complaint  Patient presents with  . Follow-up    better than she was.  cxr from pcp 2-3 weeks ago.  congestion she cannot get up.  sob improved.    Referring provider: Ma Hillock, DO  HPI:  64 year old female current everyday smoker followed in our office for bronchiectasis and COPD  PMH: History of squamous cell carcinoma, current smoker, osteoporosis Smoker/ Smoking History: Current smoker.  1 pack/day.  40-pack-year smoking history Maintenance:   Pt of: Dr. Halford Chessman  12/20/2019  - Visit   64 year old female current everyday smoker followed in office for COPD and bronchiectasis.  Patient was last seen by Dr. Halford Chessman in January/2021.  At that office visit is recommended the patient have.  It was recommended that office visit the patient continue Trelegy, continue Singulair.  She needs to stop smoking.  She is vaccinated but she has concerns that she may have most recently had the delta variant given her recent illness and suspected COPD exacerbation that was treated by primary care.  Chart review shows in July/2021 patient had a video visit where she was started on prednisone as well as treated with Levaquin.  Patient reporting she is feeling better since completing 7 days of Levaquin and her prednisone taper from primary care.  She does still continue to smoke 1 pack of cigarettes a day.  She continues to be involved in a lung cancer screening program.  She is using a rescue inhaler 1-2 times a day.  She feels this is her baseline.  She has use her rescue Hailer more when she is outside working in her garden.  She reports that she felt quite sick last month prior to the Levaquin and the prednisone.  She is unsure if she may have potentially contracted the delta variant.  She is vaccinated for COVID-19.  She did not have testing.  Questionaires / Pulmonary Flowsheets:   ACT:  No flowsheet data  found.  MMRC: No flowsheet data found.  Epworth:  No flowsheet data found.  Tests:   Pulmonary tests:  PFT 12/15/08 FEV1 2.20(90%), FVC 3.68(113%), FEV1% 60, TLC 6.54(132%), DLCO 75%, no BD response Sputum culture 06/27/16 >> Moraxella catarrhalis PFT 08/25/16 >> FEV1 1.55 (59%), FEV1% 57, TLC 5.33 (103%), DLCO 52% A1AT 07/14/18 >> 166, MM  Sleep testing:  ONO with RA 07/29/18 >> test time 6 hrs 23 min.  Basal SpO2 86%, low SpO2 72%.  Spent 289.2 min with SpO2 < 88%.  Chest imaging:  CT chest 04/21/16 >> borderline LAN, nodules in lung bases with tree in bud CT chest 10/22/16 >> moderate centrilobular emphysema, 1.2 cm nodule LUL, tree in bud, RLL BTX CT chest 01/26/17 >> moderate centrilobular emphysema, mild b/l BTX with tree in bud, LUL nodule resolved, stable 7 mm RLL nodule since 2015 HRCT 12/01/18 >> atherosclerosis, apical scarring, centrilobular emphysema, nodularity in RLL, 6 mm nodule Rt major fissure  FENO:  No results found for: NITRICOXIDE  PFT: PFT Results Latest Ref Rng & Units 08/25/2016  FVC-Pre L 2.63  FVC-Predicted Pre % 77  FVC-Post L 2.72  FVC-Predicted Post % 80  Pre FEV1/FVC % % 55  Post FEV1/FCV % % 57  FEV1-Pre L 1.45  FEV1-Predicted Pre % 55  FEV1-Post L 1.55  DLCO uncorrected ml/min/mmHg 13.36  DLCO UNC% % 52  DLCO corrected ml/min/mmHg 13.01  DLCO COR %Predicted % 51  DLVA Predicted % 61  TLC L 5.33  TLC % Predicted % 103  RV % Predicted % 125    WALK:  No flowsheet data found.  Imaging: DG Chest 2 View  Result Date: 11/28/2019 CLINICAL DATA:  Cough with increased short of breath EXAM: CHEST - 2 VIEW COMPARISON:  01/26/2019 FINDINGS: Hyperinflation with diffuse bronchitic changes, chronic. No consolidation or effusion. Stable cardiomediastinal silhouette. No pneumothorax. IMPRESSION: Hyperinflation with chronic bronchitic changes. No acute infiltrate. Electronically Signed   By: Donavan Foil M.D.   On: 11/28/2019 22:27    Lab  Results:  CBC    Component Value Date/Time   WBC 8.9 09/19/2019 1202   RBC 4.66 09/19/2019 1202   HGB 15.6 (H) 09/19/2019 1202   HGB 14.6 06/01/2013 1107   HCT 46.2 (H) 09/19/2019 1202   HCT 44.9 06/01/2013 1107   PLT 179.0 09/19/2019 1202   PLT 199 06/01/2013 1107   MCV 99.2 09/19/2019 1202   MCV 93 06/01/2013 1107   MCH 32.9 11/04/2018 1319   MCHC 33.8 09/19/2019 1202   RDW 14.2 09/19/2019 1202   RDW 13.6 06/01/2013 1107   LYMPHSABS 2.2 09/19/2019 1202   LYMPHSABS 2.5 06/01/2013 1107   MONOABS 1.1 (H) 09/19/2019 1202   EOSABS 0.1 09/19/2019 1202   EOSABS 0.0 06/01/2013 1107   BASOSABS 0.1 09/19/2019 1202   BASOSABS 0.0 06/01/2013 1107    BMET    Component Value Date/Time   NA 138 09/19/2019 1202   K 4.6 09/19/2019 1202   CL 101 09/19/2019 1202   CO2 31 09/19/2019 1202   GLUCOSE 109 (H) 09/19/2019 1202   BUN 10 09/19/2019 1202   CREATININE 0.60 09/19/2019 1202   CREATININE 0.72 03/18/2013 1212   CALCIUM 9.1 09/19/2019 1202   GFRNONAA >60 11/04/2018 1319   GFRAA >60 11/04/2018 1319    BNP No results found for: BNP  ProBNP No results found for: PROBNP  Specialty Problems      Pulmonary Problems   COPD (chronic obstructive pulmonary disease) ([place])    PFT 08/25/16 >> FEV1 1.55 (59%), FEV1% 57, TLC 5.33 (103%), DLCO 52%  CT chest 01/26/17 >> moderate centrilobular emphysema, mild b/l BTX with tree in bud, LUL nodule resolved, stable 7 mm RLL nodule since 2015       Pulmonary nodules/lesions, multiple   Bronchitis   Nocturnal hypoxemia due to emphysema ([place])    ONO with RA 07/29/18 >> test time 6 hrs 23 min.  Basal SpO2 86%, low SpO2 72%.  Spent 289.2 min with SpO2 < 88%.      Bronchiectasis with acute exacerbation ([place])    CT chest 01/26/17 >> moderate centrilobular emphysema, mild b/l BTX with tree in bud, LUL nodule resolved, stable 7 mm RLL nodule since 2015  Sputum culture 06/27/16 >> Moraxella catarrhalis  09/10/2018-Respiratory sputum-H. Influenzae       Bronchiectasis without complication ([place])      No Known Allergies  Immunization History  Administered Date(s) Administered  . Influenza Split 04/09/2011  . Influenza Whole 02/02/2009  . Influenza,inj,Quad PF,6+ Mos 01/26/2014, 02/06/2017, 03/04/2018, 01/26/2019  . Influenza-Unspecified 02/02/2013, 02/24/2015, 04/03/2016  . Moderna SARS-COVID-2 Vaccination 07/19/2019, 08/16/2019  . Pneumococcal Polysaccharide-23 01/26/2019  . Zoster Recombinat (Shingrix) 03/04/2018    Past Medical History:  Diagnosis Date  . Arthritis   . Basal cell carcinoma   . COPD (chronic obstructive pulmonary disease) ([place])   . DVT (deep venous thrombosis) ([place])   . History of acute bronchitis 03/24/2011  .  Kidney stones   . Lymphadenopathy, abdominal 03/2013   Noted on abd/pelv CT done to eval slow-to-resolve pyelo  . Meniere disorder 01/23/2012  . Osteoporosis 12/2014   Dr. Nori Riis, her GYN, dx'd her  . Pneumonia   . Recurrent pyelonephritis   . Seasonal allergic rhinitis   . Squamous cell carcinoma in situ (SCCIS) of skin of lower leg 05/04/2018   Dr. Janan Ridge, [facility]    Tobacco History: Social History   Tobacco Use  Smoking Status Current Every Day Smoker  . Packs/day: 1.00  . Years: 40.00  . Pack years: 40.00  . Types: Cigarettes  . Start date: 06/01/1978  Smokeless Tobacco Never Used  Tobacco Comment   still smoking a pack a day as of 12/20/19   Ready to quit: Yes Counseling given: Yes Comment: still smoking a pack a day as of 12/20/19  Smoking assessment and cessation counseling  Patient currently smoking: 1 pack/day I have advised the patient to quit/stop smoking as soon as possible due to high risk for multiple medical problems.  It will also be very difficult for [place] to manage patient's  respiratory symptoms and status if we continue to expose her lungs to a known irritant.  We do not advise e-cigarettes as a form of stopping smoking.  Patient is willing to  quit smoking.  Has not set a quit date.  Is willing to consider reduce to quit method.  I have advised the patient that we can assist and have options of nicotine replacement therapy, provided smoking cessation education today, provided smoking cessation counseling, and provided cessation resources.  Follow-up next office visit office visit for assessment of smoking cessation.    Smoking cessation counseling advised for: 4 min    Outpatient Encounter Medications as of 12/20/2019  Medication Sig  . albuterol (PROVENTIL) (2.5 MG/3ML) 0.083% nebulizer solution Take 3 mLs (2.5 mg total) by nebulization every 6 (six) hours as needed for wheezing or shortness of breath.  [name] alendronate (FOSAMAX) 70 MG tablet Take 1 tablet (70 mg total) by mouth every 7 (seven) days. Take with a full glass of water on an empty stomach.  [name] azelastine (ASTELIN) 0.1 % nasal spray PLACE 1 SPRAY INTO BOTH NOSTRILS 2 (TWO) TIMES DAILY. USE IN EACH NOSTRIL AS DIRECTED  . montelukast (SINGULAIR) 10 MG tablet Take 1 tablet (10 mg total) by mouth at bedtime.  . Multiple Vitamin (MULTIVITAMIN) tablet Take 1 tablet by mouth daily.  [name] Respiratory Therapy Supplies (FLUTTER) DEVI Twice a day and prn as needed, may increase if feeling worse  . Spacer/Aero-Holding Chambers (AEROCHAMBER MV) inhaler Use as instructed  . TRELEGY ELLIPTA 100-62.5-25 MCG/INH AEPB TAKE 1 PUFF BY MOUTH EVERY DAY  . tretinoin (RETIN-A) 0.05 % cream APPLY TO AFFECTED AREA EVERY DAY AT BEDTIME **PA SENT**  . VENTOLIN HFA 108 (90 Base) MCG/ACT inhaler TAKE 2 PUFFS BY MOUTH EVERY 6 HOURS AS NEEDED FOR WHEEZE OR SHORTNESS OF BREATH  . VITAMIN D, CHOLECALCIFEROL, PO Take 5,000 Units by mouth daily.   . [DISCONTINUED] azithromycin (ZITHROMAX) 250 MG tablet 500 mg day 1, then 250 mg Qd (Patient not taking: Reported on 12/20/2019)  . [DISCONTINUED] predniSONE (DELTASONE) 20 MG tablet 60 mg x3d, 40 mg x3d, 20 mg x2d, 10 mg x2d (Patient not taking: Reported on  12/20/2019)   No facility-administered encounter medications on file as of 12/20/2019.     Review of Systems  Review of Systems  Constitutional: Negative for activity change, fatigue  and fever.  HENT: Negative for sinus pressure, sinus pain and sore throat.   Respiratory: Positive for shortness of breath. Negative for cough and wheezing.   Cardiovascular: Negative for chest pain and palpitations.  Gastrointestinal: Negative for diarrhea, nausea and vomiting.  Musculoskeletal: Negative for arthralgias.  Neurological: Negative for dizziness.  Psychiatric/Behavioral: Negative for sleep disturbance. The patient is not nervous/anxious.      Physical Exam  BP 110/80 (BP Location: Left Arm, Cuff Size: Normal)   Pulse 91   Temp 98.4 F (36.9 C) (Temporal)   Ht 5\' 4"  (1.626 m)   Wt 108 lb 12.8 oz (49.4 kg)   SpO2 97%   BMI 18.68 kg/m   Wt Readings from Last 5 Encounters:  12/20/19 108 lb 12.8 oz (49.4 kg)  11/29/19 107 lb 2 oz (48.6 kg)  11/21/19 107 lb (48.5 kg)  09/19/19 108 lb (49 kg)  05/24/19 110 lb 6.4 oz (50.1 kg)    BMI Readings from Last 5 Encounters:  12/20/19 18.68 kg/m  11/29/19 17.83 kg/m  11/21/19 18.08 kg/m  09/19/19 17.97 kg/m  08/22/19 18.37 kg/m     Physical Exam Vitals and nursing note reviewed.  Constitutional:      General: She is not in acute distress.    Appearance: Normal appearance. She is normal weight.     Comments: Thin adult female  HENT:     Head: Normocephalic and atraumatic.     Right Ear: Tympanic membrane, ear canal and external ear normal. There is no impacted cerumen.     Left Ear: Tympanic membrane, ear canal and external ear normal. There is no impacted cerumen.     Nose: Nose normal. No congestion or rhinorrhea.     Mouth/Throat:     Mouth: Mucous membranes are moist.     Pharynx: Oropharynx is clear.  Eyes:     Pupils: Pupils are equal, round, and reactive to light.  Cardiovascular:     Rate and Rhythm: Normal rate and  regular rhythm.     Pulses: Normal pulses.     Heart sounds: Normal heart sounds. No murmur heard.   Pulmonary:     Effort: Pulmonary effort is normal. No respiratory distress.     Breath sounds: No decreased air movement. No decreased breath sounds, wheezing or rales.     Comments: Diminished breath sounds throughout exam Musculoskeletal:     Cervical back: Normal range of motion.  Skin:    General: Skin is warm and dry.     Capillary Refill: Capillary refill takes less than 2 seconds.  Neurological:     General: No focal deficit present.     Mental Status: She is alert and oriented to person, place, and time. Mental status is at baseline.     Gait: Gait normal.  Psychiatric:        Mood and Affect: Mood normal.        Behavior: Behavior normal.        Thought Content: Thought content normal.        Judgment: Judgment normal.       Assessment & Plan:   Nocturnal hypoxemia due to emphysema [facility]) Patient is qualified for nocturnal oxygen in the past.  It was recommended to her, she continues to decline this  COPD (chronic obstructive pulmonary disease) ([place]) Plan: Continue Trelegy Ellipta Sputum cups provided for patient today if she starts to have acute worsening symptoms Continue lung cancer screening program Follow-up in 3 months Emphasized need to  stop smoking  Bronchiectasis without complication ([place]) Recently treated with Levaquin and prednisone  Plan: Sputum cups provided today, can test sputum three-way's AFB, fungal, respiratory sputum if having acute worsening symptoms  Current every day smoker Plan: Emphasized need to stop smoking Continue lung cancer screening program     Return in about 3 months (around 03/21/2020), or if symptoms worsen or fail to improve, for Follow up with Dr. Halford Chessman.   Lauraine Rinne, NP 12/20/2019   This appointment required 32 minutes of patient care (this includes precharting, chart review, review of results, face-to-face  care, etc.).

## 2019-12-20 NOTE — Progress Notes (Signed)
Reviewed and agree with assessment/plan.   Chesley Mires, MD [facility] Pulmonary/Critical Care 12/20/2019, 3:47 PM Pager:  [phone]

## 2019-12-20 NOTE — Patient Instructions (Addendum)
You were seen today by Lauraine Rinne, NP  for:   1. Bronchiectasis without complication (HCC)  Bronchiectasis: This is the medical term which indicates that you have damage, dilated airways making you more susceptible to respiratory infection. Use a flutter valve 10 breaths twice a day or 4 to 5 breaths 4-5 times a day to help clear mucus out Let us know if you have cough with change in mucus color or fevers or chills.  At that point you would need an antibiotic. Maintain a healthy nutritious diet, eating whole foods Take your medications as prescribed   We will provide sputum cups today: Respiratory sputum, AFB, fungal  If you start to have acute worsening symptoms with your breathing or discolored mucus please produce sputum samples and bring to our lab  2. Mucopurulent chronic bronchitis (HCC)  Trelegy Ellipta  >>> 1 puff daily in the morning >>>rinse mouth out after use  >>> This inhaler contains 3 medications that help manage her respiratory status, contact our office if you cannot afford this medication or unable to remain on this medication  Note your daily symptoms > remember "red flags" for COPD:   >>>Increase in cough >>>increase in sputum production >>>increase in shortness of breath or activity  intolerance.   If you notice these symptoms, please call the office to be seen.    3. Current every day smoker  We recommend that you stop smoking.  >>>You need to set a quit date >>>If you have friends or family who smoke, let them know you are trying to quit and not to smoke around you or in your living environment  Smoking Cessation Resources:  1 800 QUIT NOW  >>> Patient to call this resource and utilize it to help support her quit smoking >>> Keep up your hard work with stopping smoking  You can also contact the [facility] >>>For smoking cessation classes call 6107000574  We do not recommend using e-cigarettes as a form of stopping smoking  You  can sign up for smoking cessation support texts and information:  >>>https://smokefree.gov/smokefreetxt    4. Nocturnal hypoxemia due to emphysema [facility])  As previously discussed at other visits we recommend starting nighttime oxygen, you have declined this in the past   We recommend today:  No orders of the defined types were placed in this encounter.  No orders of the defined types were placed in this encounter.  No orders of the defined types were placed in this encounter.   Follow Up:    Return in about 3 months (around 03/21/2020), or if symptoms worsen or fail to improve, for Follow up with Dr. Halford Chessman.   Please do your part to reduce the spread of COVID-19:      Reduce your risk of any infection  and COVID19 by using the similar precautions used for avoiding the common cold or flu:   Wash your hands often with soap and warm water for at least 20 seconds.  If soap and water are not readily available, use an alcohol-based hand sanitizer with at least 60% alcohol.   If coughing or sneezing, cover your mouth and nose by coughing or sneezing into the elbow areas of your shirt or coat, into a tissue or into your sleeve (not your hands).  WEAR A MASK when in public   Avoid shaking hands with others and consider head nods or verbal greetings only.  Avoid touching your eyes, nose, or mouth with unwashed hands.  Avoid close contact with people who are sick.  Avoid places or events with large numbers of people in one location, like concerts or sporting events.  If you have some symptoms but not all symptoms, continue to monitor at home and seek medical attention if your symptoms worsen.  If you are having a medical emergency, call 911.   [place] / e-Visit: [email or web address]         [facility]: [facility]: [phone]                    [facility]: [phone]     It is flu season:   >>> Best ways to protect herself from the flu: Receive the yearly flu vaccine, practice good hand hygiene washing with soap and also using hand sanitizer when available, eat a nutritious meals, get adequate rest, hydrate appropriately   Please contact the office if your symptoms worsen or you have concerns that you are not improving.   Thank you for choosing [facility] for your healthcare, and for allowing [place] to partner with you on your healthcare journey. I am thankful to be able to provide care to you today.   Wyn Quaker FNP-C

## 2019-12-20 NOTE — Assessment & Plan Note (Signed)
Patient is qualified for nocturnal oxygen in the past.  It was recommended to her, she continues to decline this

## 2020-01-07 IMAGING — DX CHEST - 2 VIEW
2 series · 2 of 2 positions shown · non-contrast
Comparison: 06/14/2018

CLINICAL DATA: Bronchiectasis with acute exacerbation

EXAM:
CHEST - 2 VIEW

[chest pa]
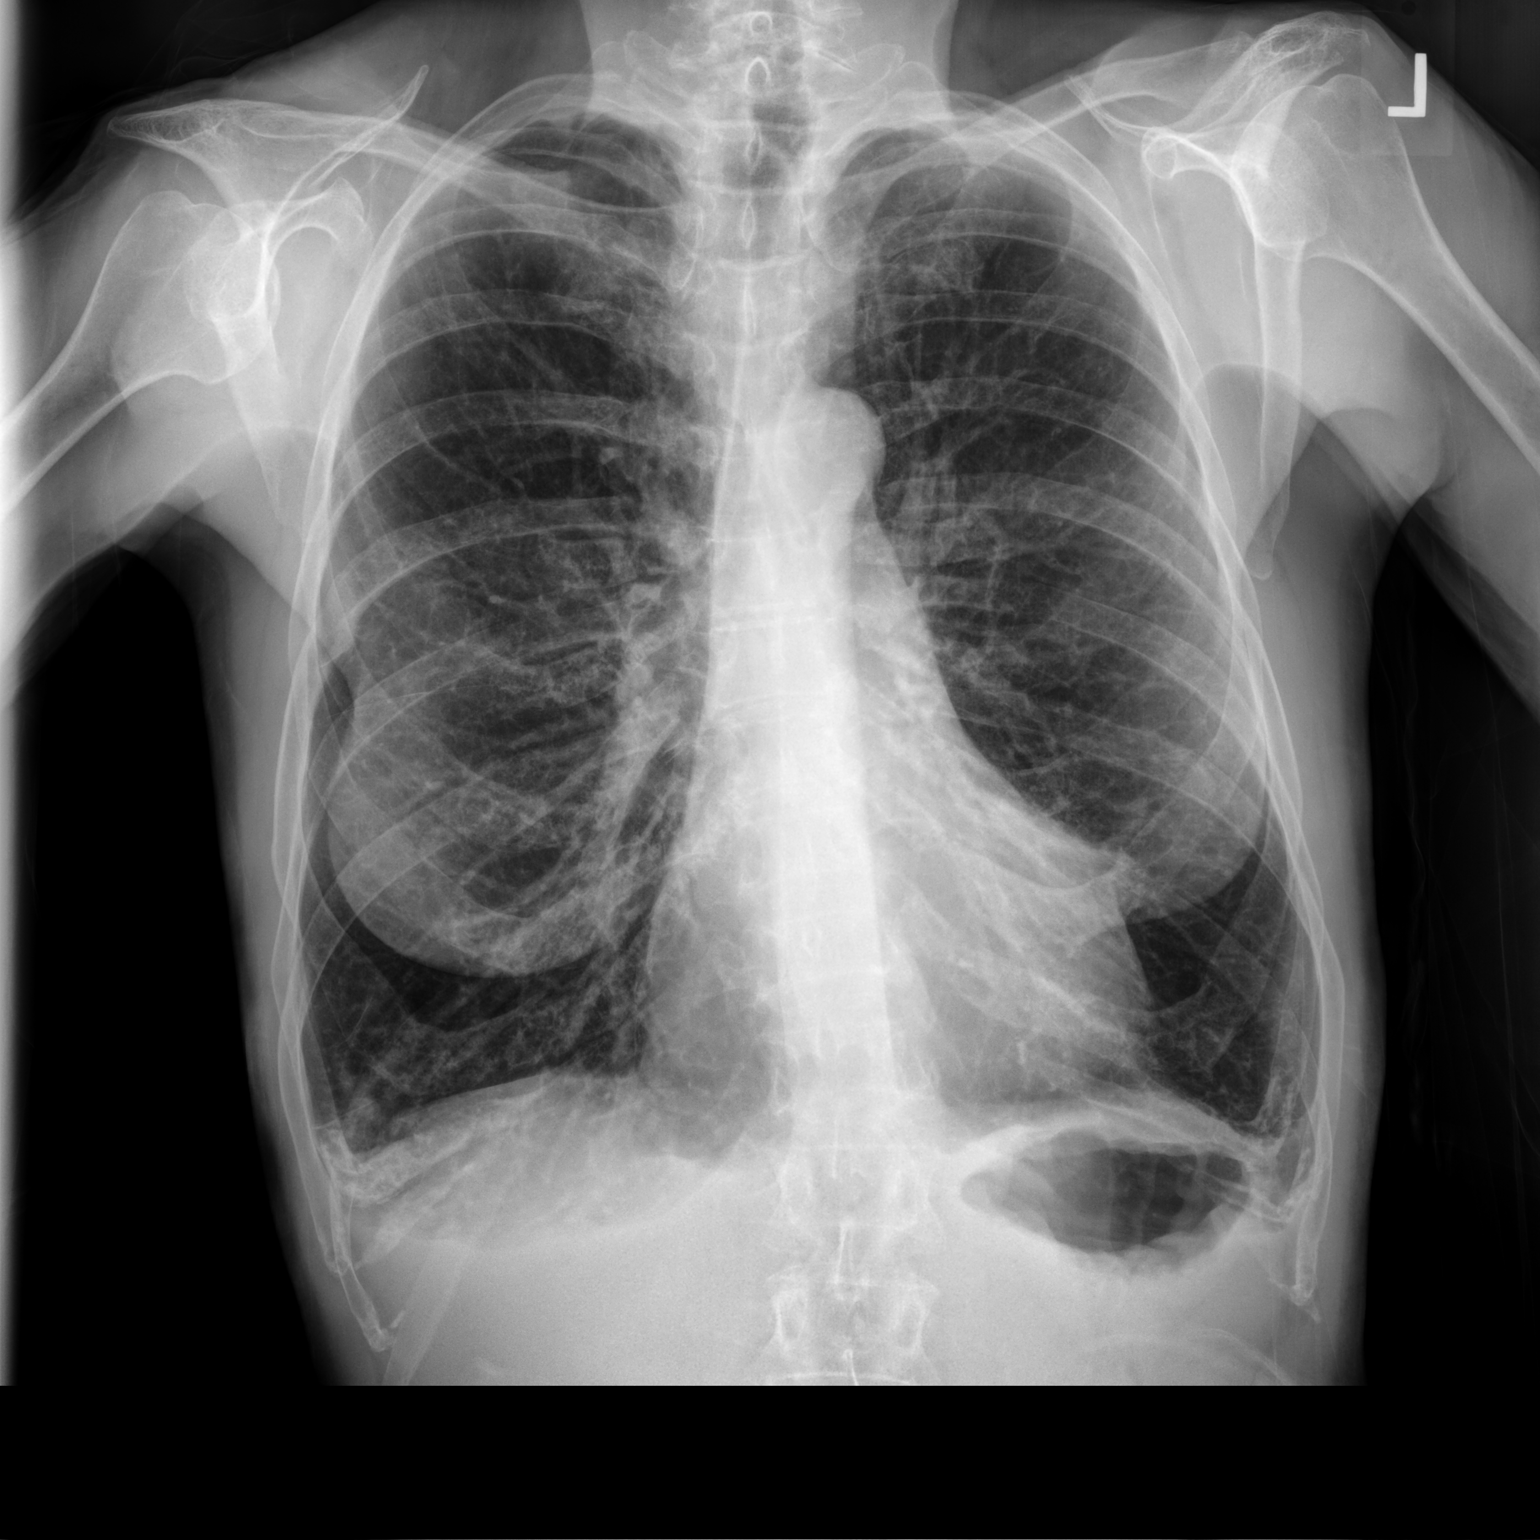

[chest lat]
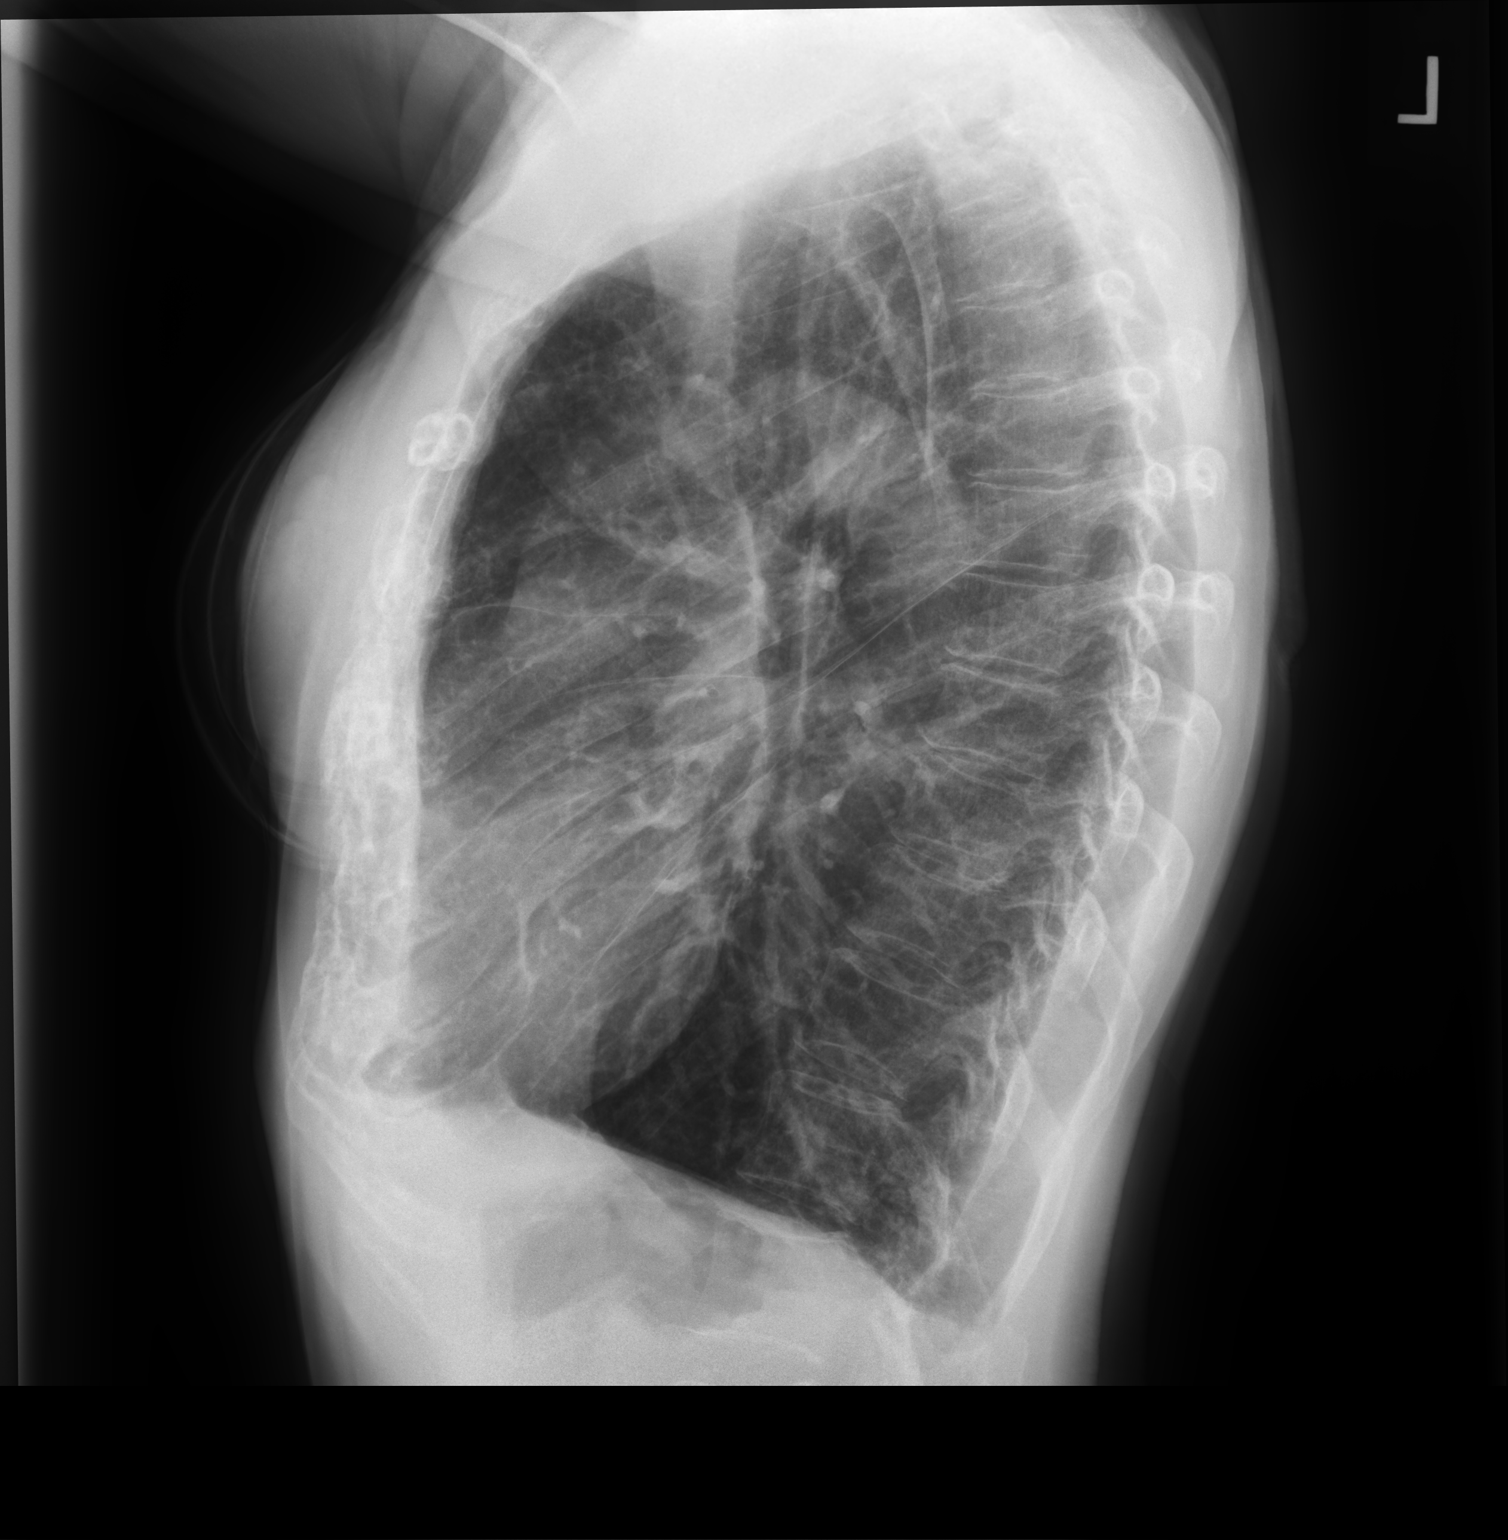

[2 of 2 positions shown; findings below may reference images not displayed]

FINDINGS: Normal heart size and mediastinal contours. Large lung volumes with
diaphragm flattening. There is history of bronchiectasis which is
not well visualized. There is no edema, consolidation, effusion, or
pneumothorax.
IMPRESSION: 1. No evidence of active disease.
2. Hyperinflation.

## 2020-01-31 ENCOUNTER — Other Ambulatory Visit: Payer: Self-pay | Admitting: Pulmonary Disease

## 2020-01-31 ENCOUNTER — Other Ambulatory Visit: Payer: Self-pay | Admitting: Family Medicine

## 2020-03-09 ENCOUNTER — Telehealth: Payer: Self-pay | Admitting: Family Medicine

## 2020-03-09 DIAGNOSIS — L905 Scar conditions and fibrosis of skin: Secondary | ICD-10-CM | POA: Diagnosis not present

## 2020-03-09 DIAGNOSIS — L814 Other melanin hyperpigmentation: Secondary | ICD-10-CM | POA: Diagnosis not present

## 2020-03-09 DIAGNOSIS — L570 Actinic keratosis: Secondary | ICD-10-CM | POA: Diagnosis not present

## 2020-03-09 DIAGNOSIS — L819 Disorder of pigmentation, unspecified: Secondary | ICD-10-CM | POA: Diagnosis not present

## 2020-03-09 DIAGNOSIS — D229 Melanocytic nevi, unspecified: Secondary | ICD-10-CM | POA: Diagnosis not present

## 2020-03-09 NOTE — Telephone Encounter (Signed)
Patient has outside lab orders for CMP + LP from [facility]. She would like the labs drawn here. If okay, please enter orders for labs so patient can be scheduled. Copy of outside lab order placed in Dr. Lucita Lora inbox in front office.

## 2020-03-09 NOTE — Telephone Encounter (Signed)
Informed pt that we cannot do labs that is ordered from another provider outside of our office.

## 2020-03-15 DIAGNOSIS — M9901 Segmental and somatic dysfunction of cervical region: Secondary | ICD-10-CM | POA: Diagnosis not present

## 2020-03-15 DIAGNOSIS — M9903 Segmental and somatic dysfunction of lumbar region: Secondary | ICD-10-CM | POA: Diagnosis not present

## 2020-03-15 DIAGNOSIS — M5411 Radiculopathy, occipito-atlanto-axial region: Secondary | ICD-10-CM | POA: Diagnosis not present

## 2020-03-15 DIAGNOSIS — M5136 Other intervertebral disc degeneration, lumbar region: Secondary | ICD-10-CM | POA: Diagnosis not present

## 2020-03-19 DIAGNOSIS — M9903 Segmental and somatic dysfunction of lumbar region: Secondary | ICD-10-CM | POA: Diagnosis not present

## 2020-03-19 DIAGNOSIS — M5411 Radiculopathy, occipito-atlanto-axial region: Secondary | ICD-10-CM | POA: Diagnosis not present

## 2020-03-19 DIAGNOSIS — M5136 Other intervertebral disc degeneration, lumbar region: Secondary | ICD-10-CM | POA: Diagnosis not present

## 2020-03-19 DIAGNOSIS — M9901 Segmental and somatic dysfunction of cervical region: Secondary | ICD-10-CM | POA: Diagnosis not present

## 2020-03-21 ENCOUNTER — Encounter: Payer: Self-pay | Admitting: Pulmonary Disease

## 2020-03-21 ENCOUNTER — Other Ambulatory Visit: Payer: Self-pay

## 2020-03-21 ENCOUNTER — Ambulatory Visit (INDEPENDENT_AMBULATORY_CARE_PROVIDER_SITE_OTHER): Payer: BC Managed Care – PPO | Admitting: Pulmonary Disease

## 2020-03-21 VITALS — BP 142/74 | HR 79 | Temp 97.3°F | Ht 64.0 in | Wt 112.0 lb

## 2020-03-21 DIAGNOSIS — G4736 Sleep related hypoventilation in conditions classified elsewhere: Secondary | ICD-10-CM

## 2020-03-21 DIAGNOSIS — M9903 Segmental and somatic dysfunction of lumbar region: Secondary | ICD-10-CM | POA: Diagnosis not present

## 2020-03-21 DIAGNOSIS — J411 Mucopurulent chronic bronchitis: Secondary | ICD-10-CM

## 2020-03-21 DIAGNOSIS — F17200 Nicotine dependence, unspecified, uncomplicated: Secondary | ICD-10-CM

## 2020-03-21 DIAGNOSIS — F17210 Nicotine dependence, cigarettes, uncomplicated: Secondary | ICD-10-CM | POA: Diagnosis not present

## 2020-03-21 DIAGNOSIS — M5411 Radiculopathy, occipito-atlanto-axial region: Secondary | ICD-10-CM | POA: Diagnosis not present

## 2020-03-21 DIAGNOSIS — Z0000 Encounter for general adult medical examination without abnormal findings: Secondary | ICD-10-CM | POA: Insufficient documentation

## 2020-03-21 DIAGNOSIS — J479 Bronchiectasis, uncomplicated: Secondary | ICD-10-CM

## 2020-03-21 DIAGNOSIS — M5136 Other intervertebral disc degeneration, lumbar region: Secondary | ICD-10-CM | POA: Diagnosis not present

## 2020-03-21 DIAGNOSIS — M9901 Segmental and somatic dysfunction of cervical region: Secondary | ICD-10-CM | POA: Diagnosis not present

## 2020-03-21 DIAGNOSIS — J439 Emphysema, unspecified: Secondary | ICD-10-CM | POA: Diagnosis not present

## 2020-03-21 NOTE — Progress Notes (Signed)
@Patient  ID: [name], female    DOB: 07-16-1955, 64 y.o.   MRN: [id number]  Chief Complaint  Patient presents with  . Follow-up    bronchiectasis, denies problem    Referring provider: Ma Hillock, DO  HPI:  64 year old female current everyday smoker followed in our office for bronchiectasis and COPD  PMH: History of squamous cell carcinoma, current smoker, osteoporosis Smoker/ Smoking History: Current smoker.  1 pack/day.  40-pack-year smoking history Maintenance:  Trelegy Ellipta  Pt of: Dr. Halford Chessman  03/21/2020  - Visit   64 year old female current smoker followed in our office for bronchiectasis and COPD.  Patient presenting today as a 22-month follow-up.  She reports no acute respiratory concerns.  She does not utilize her flutter valve as prescribed.  She reports that she uses it on as-needed basis especially she feels more congested.  She continues to smoke 1 pack/day.  She has received the first 2 COVID-19 vaccinations.  She is unsure if she will receive the booster vaccine.  She has received her flu vaccine.  She is followed in our office by Dr. Halford Chessman.  Patient is enrolled in the lung cancer screening program.  Next CT is due in December/2021.  Questionaires / Pulmonary Flowsheets:   ACT:  No flowsheet data found.  MMRC: mMRC Dyspnea Scale mMRC Score  03/21/2020 2    Epworth:  No flowsheet data found.  Tests:   Pulmonary tests:  PFT 12/15/08 FEV1 2.20(90%), FVC 3.68(113%), FEV1% 60, TLC 6.54(132%), DLCO 75%, no BD response Sputum culture 06/27/16 >> Moraxella catarrhalis PFT 08/25/16 >> FEV1 1.55 (59%), FEV1% 57, TLC 5.33 (103%), DLCO 52% A1AT 07/14/18 >> 166, MM  Sleep testing:  ONO with RA 07/29/18 >> test time 6 hrs 23 min.  Basal SpO2 86%, low SpO2 72%.  Spent 289.2 min with SpO2 < 88%.  Chest imaging:  CT chest 04/21/16 >> borderline LAN, nodules in lung bases with tree in bud CT chest 10/22/16 >> moderate centrilobular emphysema, 1.2 cm nodule LUL,  tree in bud, RLL BTX CT chest 01/26/17 >> moderate centrilobular emphysema, mild b/l BTX with tree in bud, LUL nodule resolved, stable 7 mm RLL nodule since 2015 HRCT 12/01/18 >> atherosclerosis, apical scarring, centrilobular emphysema, nodularity in RLL, 6 mm nodule Rt major fissure  FENO:  No results found for: NITRICOXIDE  PFT: PFT Results Latest Ref Rng & Units 08/25/2016  FVC-Pre L 2.63  FVC-Predicted Pre % 77  FVC-Post L 2.72  FVC-Predicted Post % 80  Pre FEV1/FVC % % 55  Post FEV1/FCV % % 57  FEV1-Pre L 1.45  FEV1-Predicted Pre % 55  FEV1-Post L 1.55  DLCO uncorrected ml/min/mmHg 13.36  DLCO UNC% % 52  DLCO corrected ml/min/mmHg 13.01  DLCO COR %Predicted % 51  DLVA Predicted % 61  TLC L 5.33  TLC % Predicted % 103  RV % Predicted % 125    WALK:  No flowsheet data found.  Imaging: No results found.  Lab Results:  CBC    Component Value Date/Time   WBC 8.9 09/19/2019 1202   RBC 4.66 09/19/2019 1202   HGB 15.6 (H) 09/19/2019 1202   HGB 14.6 06/01/2013 1107   HCT 46.2 (H) 09/19/2019 1202   HCT 44.9 06/01/2013 1107   PLT 179.0 09/19/2019 1202   PLT 199 06/01/2013 1107   MCV 99.2 09/19/2019 1202   MCV 93 06/01/2013 1107   MCH 32.9 11/04/2018 1319   MCHC 33.8 09/19/2019 1202   RDW 14.2  09/19/2019 1202   RDW 13.6 06/01/2013 1107   LYMPHSABS 2.2 09/19/2019 1202   LYMPHSABS 2.5 06/01/2013 1107   MONOABS 1.1 (H) 09/19/2019 1202   EOSABS 0.1 09/19/2019 1202   EOSABS 0.0 06/01/2013 1107   BASOSABS 0.1 09/19/2019 1202   BASOSABS 0.0 06/01/2013 1107    BMET    Component Value Date/Time   NA 138 09/19/2019 1202   K 4.6 09/19/2019 1202   CL 101 09/19/2019 1202   CO2 31 09/19/2019 1202   GLUCOSE 109 (H) 09/19/2019 1202   BUN 10 09/19/2019 1202   CREATININE 0.60 09/19/2019 1202   CREATININE 0.72 03/18/2013 1212   CALCIUM 9.1 09/19/2019 1202   GFRNONAA >60 11/04/2018 1319   GFRAA >60 11/04/2018 1319    BNP No results found for: BNP  ProBNP No  results found for: PROBNP  Specialty Problems      Pulmonary Problems   COPD (chronic obstructive pulmonary disease) (HCC)    PFT 08/25/16 >> FEV1 1.55 (59%), FEV1% 57, TLC 5.33 (103%), DLCO 52%  CT chest 01/26/17 >> moderate centrilobular emphysema, mild b/l BTX with tree in bud, LUL nodule resolved, stable 7 mm RLL nodule since 2015       Pulmonary nodules/lesions, multiple   Bronchitis   Nocturnal hypoxemia due to emphysema ([place])    ONO with RA 07/29/18 >> test time 6 hrs 23 min.  Basal SpO2 86%, low SpO2 72%.  Spent 289.2 min with SpO2 < 88%.      Bronchiectasis with acute exacerbation ([place])    CT chest 01/26/17 >> moderate centrilobular emphysema, mild b/l BTX with tree in bud, LUL nodule resolved, stable 7 mm RLL nodule since 2015  Sputum culture 06/27/16 >> Moraxella catarrhalis  09/10/2018-Respiratory sputum-H. Influenzae      Bronchiectasis without complication ([place])      No Known Allergies  Immunization History  Administered Date(s) Administered  . Influenza Inj Mdck Quad Pf 02/28/2020  . Influenza Split 04/09/2011  . Influenza Whole 02/02/2009  . Influenza,inj,Quad PF,6+ Mos 01/26/2014, 02/06/2017, 03/04/2018, 01/26/2019  . Influenza-Unspecified 02/02/2013, 02/24/2015, 04/03/2016  . Moderna SARS-COVID-2 Vaccination 07/19/2019, 08/16/2019  . Pneumococcal Polysaccharide-23 01/26/2019  . Zoster Recombinat (Shingrix) 03/04/2018    Past Medical History:  Diagnosis Date  . Arthritis   . Basal cell carcinoma   . COPD (chronic obstructive pulmonary disease) ([place])   . DVT (deep venous thrombosis) ([place])   . History of acute bronchitis 03/24/2011  . Kidney stones   . Lymphadenopathy, abdominal 03/2013   Noted on abd/pelv CT done to eval slow-to-resolve pyelo  . Meniere disorder 01/23/2012  . Osteoporosis 12/2014   Dr. Nori Riis, her GYN, dx'd her  . Pneumonia   . Recurrent pyelonephritis   . Seasonal allergic rhinitis   . Squamous cell carcinoma in situ (SCCIS) of skin  of lower leg 05/04/2018   Dr. Janan Ridge, [facility]    Tobacco History: Social History   Tobacco Use  Smoking Status Current Every Day Smoker  . Packs/day: 1.00  . Years: 40.00  . Pack years: 40.00  . Types: Cigarettes  . Start date: 06/01/1978  Smokeless Tobacco Never Used  Tobacco Comment   still smoking a pack a day as of 03/21/20   Ready to quit: Not Answered Counseling given: Not Answered Comment: still smoking a pack a day as of 03/21/20  Smoking assessment and cessation counseling  Patient currently smoking: 1 ppd I have advised the patient to quit/stop smoking as soon as possible  due to high risk for multiple medical problems.  It will also be very difficult for [place] to manage patient's  respiratory symptoms and status if we continue to expose her lungs to a known irritant.  We do not advise e-cigarettes as a form of stopping smoking.  Patient is not willing to quit smoking.  I have advised the patient that we can assist and have options of nicotine replacement therapy, provided smoking cessation education today, provided smoking cessation counseling, and provided cessation resources.  Follow-up next office visit office visit for assessment of smoking cessation.    Smoking cessation counseling advised for: 4 min    Outpatient Encounter Medications as of 03/21/2020  Medication Sig  . albuterol (PROVENTIL) (2.5 MG/3ML) 0.083% nebulizer solution Take 3 mLs (2.5 mg total) by nebulization every 6 (six) hours as needed for wheezing or shortness of breath.  [name] alendronate (FOSAMAX) 70 MG tablet Take 1 tablet (70 mg total) by mouth every 7 (seven) days. Take with a full glass of water on an empty stomach.  [name] azelastine (ASTELIN) 0.1 % nasal spray PLACE 1 SPRAY INTO BOTH NOSTRILS 2 (TWO) TIMES DAILY. USE IN EACH NOSTRIL AS DIRECTED  . montelukast (SINGULAIR) 10 MG tablet Take 1 tablet (10 mg total) by mouth at bedtime.  . Multiple Vitamin (MULTIVITAMIN) tablet  Take 1 tablet by mouth daily.  [name] Respiratory Therapy Supplies (FLUTTER) DEVI Twice a day and prn as needed, may increase if feeling worse  . Spacer/Aero-Holding Chambers (AEROCHAMBER MV) inhaler Use as instructed  . TRELEGY ELLIPTA 100-62.5-25 MCG/INH AEPB TAKE 1 PUFF BY MOUTH EVERY DAY  . tretinoin (RETIN-A) 0.05 % cream APPLY TO AFFECTED AREA EVERY DAY AT BEDTIME **PA SENT**  . VENTOLIN HFA 108 (90 Base) MCG/ACT inhaler TAKE 2 PUFFS BY MOUTH EVERY 6 HOURS AS NEEDED FOR WHEEZE OR SHORTNESS OF BREATH  . VITAMIN D, CHOLECALCIFEROL, PO Take 5,000 Units by mouth daily.   . [DISCONTINUED] gabapentin (NEURONTIN) 100 MG capsule TAKE 2 CAPSULES (200 MG TOTAL) BY MOUTH AT BEDTIME.   No facility-administered encounter medications on file as of 03/21/2020.     Review of Systems  Review of Systems  Constitutional: Negative for activity change, fatigue and fever.  HENT: Positive for congestion. Negative for sinus pressure, sinus pain and sore throat.   Respiratory: Positive for cough. Negative for shortness of breath and wheezing.   Cardiovascular: Negative for chest pain and palpitations.  Gastrointestinal: Negative for diarrhea, nausea and vomiting.  Musculoskeletal: Negative for arthralgias.  Neurological: Negative for dizziness.  Psychiatric/Behavioral: Negative for sleep disturbance. The patient is not nervous/anxious.      Physical Exam  BP (!) 142/74 (BP Location: Left Arm, Cuff Size: Normal)   Pulse 79   Temp (!) 97.3 F (36.3 C) (Skin)   Ht 5\' 4"  (1.626 m)   Wt 112 lb (50.8 kg)   SpO2 99%   BMI 19.22 kg/m   Wt Readings from Last 5 Encounters:  03/21/20 112 lb (50.8 kg)  12/20/19 108 lb 12.8 oz (49.4 kg)  11/29/19 107 lb 2 oz (48.6 kg)  11/21/19 107 lb (48.5 kg)  09/19/19 108 lb (49 kg)    BMI Readings from Last 5 Encounters:  03/21/20 19.22 kg/m  12/20/19 18.68 kg/m  11/29/19 17.83 kg/m  11/21/19 18.08 kg/m  09/19/19 17.97 kg/m     Physical Exam Vitals and  nursing note reviewed.  Constitutional:      General: She is not in acute distress.    Appearance:  Normal appearance. She is normal weight.  HENT:     Head: Normocephalic and atraumatic.     Right Ear: External ear normal.     Left Ear: External ear normal.     Nose: Rhinorrhea present. No congestion.     Mouth/Throat:     Mouth: Mucous membranes are moist.     Pharynx: Oropharynx is clear.     Comments: Postnasal drip Eyes:     Pupils: Pupils are equal, round, and reactive to light.  Cardiovascular:     Rate and Rhythm: Normal rate and regular rhythm.     Pulses: Normal pulses.     Heart sounds: Normal heart sounds. No murmur heard.   Pulmonary:     Effort: Pulmonary effort is normal. No respiratory distress.     Breath sounds: Normal breath sounds. No decreased air movement. No decreased breath sounds, wheezing or rales.  Musculoskeletal:     Cervical back: Normal range of motion.  Skin:    General: Skin is warm and dry.     Capillary Refill: Capillary refill takes less than 2 seconds.  Neurological:     General: No focal deficit present.     Mental Status: She is alert and oriented to person, place, and time. Mental status is at baseline.     Gait: Gait normal.  Psychiatric:        Mood and Affect: Mood normal.        Behavior: Behavior normal.        Thought Content: Thought content normal.        Judgment: Judgment normal.       Assessment & Plan:   Bronchiectasis without complication ([place]) Plan: Encourage patient to resume flutter valve use Counseled on follow-up precautions Continue Mucinex Hydrate well Avoid sick individuals Recommend COVID-19 booster vaccine  COPD (chronic obstructive pulmonary disease) ([place]) Plan: Continue Trelegy Ellipta Emphasized need to stop smoking Obtain lung cancer screening CT in December/2021 Recommend COVID-19 booster  Nocturnal hypoxemia due to emphysema [facility]) Patient is qualify for nocturnal oxygen in the past, it was  recommended to her but she declines this  Current every day smoker Plan: Emphasized need to stop smoking Recommend lung cancer screening CT as planned in [place] maintenance Up-to-date with flu vaccine Up-to-date with Pneumovax 23 Received first 2 COVID-19 vaccinations  Plan: Recommend COVID-19 booster    Return in about 6 months (around 09/18/2020), or if symptoms worsen or fail to improve, for Follow up with Dr. Halford Chessman.   Lauraine Rinne, NP 03/21/2020   This appointment required 32 minutes of patient care (this includes precharting, chart review, review of results, face-to-face care, etc.).

## 2020-03-21 NOTE — Patient Instructions (Addendum)
You were seen today by Lauraine Rinne, NP  for:   It was a pleasure seeing you today.  I am glad that you are doing well.  As discussed today please consider obtaining the COVID-19 booster vaccination given your known bronchiectasis as well as active smoker status and COPD.  You are high risk for complications from [id number].  Hope the wedding goes well and you have a great time.  Have a very happy holidays and Angela Nevin Christmas  We will see you back in 6 months, sooner if you need  Take care and stay safe,  Violet Seabury  1. Bronchiectasis without complication ([place])  As discussed today please resume flutter valve use as instructed see below  Bronchiectasis: This is the medical term which indicates that you have damage, dilated airways making you more susceptible to respiratory infection. Use a flutter valve 10 breaths twice a day or 4 to 5 breaths 4-5 times a day to help clear mucus out Let us know if you have cough with change in mucus color or fevers or chills.  At that point you would need an antibiotic. Maintain a healthy nutritious diet, eating whole foods Take your medications as prescribed   Can continue to use Mucinex  Hydrate well  2. Mucopurulent chronic bronchitis (HCC)  Trelegy Ellipta  >>> 1 puff daily in the morning >>>rinse mouth out after use  >>> This inhaler contains 3 medications that help manage her respiratory status, contact our office if you cannot afford this medication or unable to remain on this medication  Note your daily symptoms > remember "red flags" for COPD:   >>>Increase in cough >>>increase in sputum production >>>increase in shortness of breath or activity  intolerance.   If you notice these symptoms, please call the office to be seen.   3. Current every day smoker  We recommend that you obtain the lung cancer screening CT planned in December/2021  We recommend that you stop smoking.  >>>You need to set a quit date >>>If you have friends or family  who smoke, let them know you are trying to quit and not to smoke around you or in your living environment  Smoking Cessation Resources:  1 800 QUIT NOW  >>> Patient to call this resource and utilize it to help support her quit smoking >>> Keep up your hard work with stopping smoking  You can also contact the [facility] >>>For smoking cessation classes call [phone]  We do not recommend using e-cigarettes as a form of stopping smoking  You can sign up for smoking cessation support texts and information:  >>>https://smokefree.gov/smokefreetxt    4. Healthcare maintenance  We would recommend you obtain the COVID-19 booster vaccination  As discussed today if you do decide to proceed forward with the booster vaccine you could receive either the [facility] or the [facility]   Follow Up:    Return in about 6 months (around 09/18/2020), or if symptoms worsen or fail to improve, for Follow up with Dr. Halford Chessman.   Notification of test results are managed in the following manner: If there are  any recommendations or changes to the  plan of care discussed in office today,  we will contact you and let you know what they are. If you do not hear from [place], then your results are normal and you can view them through your  MyChart account , or a letter will be sent to you. Thank you again for trusting [place] with your care  -  Thank you, [facility]    It is flu season:   >>> Best ways to protect herself from the flu: Receive the yearly flu vaccine, practice good hand hygiene washing with soap and also using hand sanitizer when available, eat a nutritious meals, get adequate rest, hydrate appropriately       Please contact the office if your symptoms worsen or you have concerns that you are not improving.   Thank you for choosing [facility] for your healthcare, and for allowing [place] to partner with you on your healthcare journey. I am thankful to be able to provide care to  you today.   Wyn Quaker FNP-C   Health Risks of Smoking Smoking cigarettes is very bad for your health. Tobacco smoke has over 200 known poisons in it. It contains the poisonous gases nitrogen oxide and carbon monoxide. There are over 60 chemicals in tobacco smoke that cause cancer. Smoking is difficult to quit because a chemical in tobacco, called nicotine, causes addiction or dependence. When you smoke and inhale, nicotine is absorbed rapidly into the bloodstream through your lungs. Both inhaled and non-inhaled nicotine may be addictive. What are the risks of cigarette smoke? Cigarette smokers have an increased risk of many serious medical problems, including:  Lung cancer.  Lung disease, such as pneumonia, bronchitis, and emphysema.  Chest pain (angina) and heart attack because the heart is not getting enough oxygen.  Heart disease and peripheral blood vessel disease.  High blood pressure (hypertension).  Stroke.  Oral cancer, including cancer of the lip, mouth, or voice box.  Bladder cancer.  Pancreatic cancer.  Cervical cancer.  Pregnancy complications, including premature birth.  Stillbirths and smaller newborn babies, birth defects, and genetic damage to sperm.  Early menopause.  Lower estrogen level for women.  Infertility.  Facial wrinkles.  Blindness.  Increased risk of broken bones (fractures).  Senile dementia.  Stomach ulcers and internal bleeding.  Delayed wound healing and increased risk of complications during surgery.  Even smoking lightly shortens your life expectancy by several years. Because of secondhand smoke exposure, children of smokers have an increased risk of the following:  Sudden infant death syndrome (SIDS).  Respiratory infections.  Lung cancer.  Heart disease.  Ear infections. What are the benefits of quitting? There are many health benefits of quitting smoking. Here are some of them:  Within days of quitting smoking,  your risk of having a heart attack decreases, your blood flow improves, and your lung capacity improves. Blood pressure, pulse rate, and breathing patterns start returning to normal soon after quitting.  Within months, your lungs may clear up completely.  Quitting for 10 years reduces your risk of developing lung cancer and heart disease to almost that of a nonsmoker.  People who quit may see an improvement in their overall quality of life. How do I quit smoking?     Smoking is an addiction with both physical and psychological effects, and longtime habits can be hard to change. Your health care provider can recommend:  Programs and community resources, which may include group support, education, or talk therapy.  Prescription medicines to help reduce cravings.  Nicotine replacement products, such as patches, gum, and nasal sprays. Use these products only as directed. Do not replace cigarette smoking with electronic cigarettes, which are commonly called e-cigarettes. The safety of e-cigarettes is not known, and some may contain harmful chemicals.  A combination of two or more of these methods. Where to find more information  American Lung Association: www.lung.org  American Cancer Society: www.cancer.org Summary  Smoking cigarettes is very bad for your health. Cigarette smokers have an increased risk of many serious medical problems, including several cancers, heart disease, and stroke.  Smoking is an addiction with both physical and psychological effects, and longtime habits can be hard to change.  By stopping right away, you can greatly reduce the risk of medical problems for you and your family.  To help you quit smoking, your health care provider can recommend programs, community resources, prescription medicines, and nicotine replacement products such as patches, gum, and nasal sprays. This information is not intended to replace advice given to you by your health care provider.  Make sure you discuss any questions you have with your health care provider. Document Revised: 07/23/2017 Document Reviewed: 04/25/2016 Elsevier Patient Education  2020 [employer].

## 2020-03-21 NOTE — Progress Notes (Signed)
Reviewed and agree with assessment/plan.   Chesley Mires, MD [facility] Pulmonary/Critical Care 03/21/2020, 1:45 PM Pager:  [phone]

## 2020-03-21 NOTE — Assessment & Plan Note (Signed)
Patient is qualify for nocturnal oxygen in the past, it was recommended to her but she declines this

## 2020-03-21 NOTE — Assessment & Plan Note (Signed)
Up-to-date with flu vaccine Up-to-date with Pneumovax 23 Received first 2 COVID-19 vaccinations  Plan: Recommend COVID-19 booster

## 2020-03-21 NOTE — Assessment & Plan Note (Signed)
Plan: Continue Trelegy Ellipta Emphasized need to stop smoking Obtain lung cancer screening CT in December/2021 Recommend COVID-19 booster

## 2020-03-21 NOTE — Assessment & Plan Note (Signed)
Plan: Emphasized need to stop smoking Recommend lung cancer screening CT as planned in December/2021

## 2020-03-21 NOTE — Assessment & Plan Note (Signed)
Plan: Encourage patient to resume flutter valve use Counseled on follow-up precautions Continue Mucinex Hydrate well Avoid sick individuals Recommend COVID-19 booster vaccine

## 2020-03-26 DIAGNOSIS — M5136 Other intervertebral disc degeneration, lumbar region: Secondary | ICD-10-CM | POA: Diagnosis not present

## 2020-03-26 DIAGNOSIS — M9901 Segmental and somatic dysfunction of cervical region: Secondary | ICD-10-CM | POA: Diagnosis not present

## 2020-03-26 DIAGNOSIS — M5411 Radiculopathy, occipito-atlanto-axial region: Secondary | ICD-10-CM | POA: Diagnosis not present

## 2020-03-26 DIAGNOSIS — M9903 Segmental and somatic dysfunction of lumbar region: Secondary | ICD-10-CM | POA: Diagnosis not present

## 2020-04-12 DIAGNOSIS — L570 Actinic keratosis: Secondary | ICD-10-CM | POA: Diagnosis not present

## 2020-04-12 DIAGNOSIS — D225 Melanocytic nevi of trunk: Secondary | ICD-10-CM | POA: Diagnosis not present

## 2020-04-12 DIAGNOSIS — Z85828 Personal history of other malignant neoplasm of skin: Secondary | ICD-10-CM | POA: Diagnosis not present

## 2020-04-12 DIAGNOSIS — L400 Psoriasis vulgaris: Secondary | ICD-10-CM | POA: Diagnosis not present

## 2020-04-12 DIAGNOSIS — L905 Scar conditions and fibrosis of skin: Secondary | ICD-10-CM | POA: Diagnosis not present

## 2020-04-12 DIAGNOSIS — D485 Neoplasm of uncertain behavior of skin: Secondary | ICD-10-CM | POA: Diagnosis not present

## 2020-04-12 DIAGNOSIS — C44729 Squamous cell carcinoma of skin of left lower limb, including hip: Secondary | ICD-10-CM | POA: Diagnosis not present

## 2020-04-12 DIAGNOSIS — B079 Viral wart, unspecified: Secondary | ICD-10-CM | POA: Diagnosis not present

## 2020-04-19 DIAGNOSIS — D0471 Carcinoma in situ of skin of right lower limb, including hip: Secondary | ICD-10-CM | POA: Diagnosis not present

## 2020-04-19 DIAGNOSIS — D0472 Carcinoma in situ of skin of left lower limb, including hip: Secondary | ICD-10-CM | POA: Diagnosis not present

## 2020-04-19 DIAGNOSIS — S90821A Blister (nonthermal), right foot, initial encounter: Secondary | ICD-10-CM | POA: Diagnosis not present

## 2020-05-03 ENCOUNTER — Inpatient Hospital Stay: Admission: RE | Admit: 2020-05-03 | Payer: BC Managed Care – PPO | Source: Ambulatory Visit

## 2020-05-08 ENCOUNTER — Ambulatory Visit: Payer: BC Managed Care – PPO

## 2020-05-08 ENCOUNTER — Other Ambulatory Visit: Payer: Self-pay | Admitting: *Deleted

## 2020-05-08 DIAGNOSIS — F17210 Nicotine dependence, cigarettes, uncomplicated: Secondary | ICD-10-CM

## 2020-05-09 NOTE — Telephone Encounter (Signed)
Please advise on patient mychart message  Happy New Year!  Hope all is well.  I got covid Dec. 27 pm or should I say it has me.  Fever, aches, tired, etc.  On Monday Jan. 3,  I felt pretty good.  Jan.4, 5 have  been awful. My oxygen levels are staying around 94-97, My temp. is 98.7.Kathleen KitchenMarland Kitchen96.7 is what my normal usually is, and I am very tired again. I'm coughing, but not an "out of control" cough.  The sputum that I'm beginning to get up is starting to get a good greeenish tint.  I do not want to get pneumonia!  Should I be taking something other than D, C, Zinc, mucinex, and immune extras?

## 2020-05-15 ENCOUNTER — Other Ambulatory Visit: Payer: Self-pay | Admitting: Family Medicine

## 2020-05-24 ENCOUNTER — Ambulatory Visit
Admission: RE | Admit: 2020-05-24 | Discharge: 2020-05-24 | Disposition: A | Discharge status: Home / Self Care | Payer: BC Managed Care – PPO | Source: Ambulatory Visit | Attending: Acute Care | Admitting: Acute Care

## 2020-05-24 ENCOUNTER — Other Ambulatory Visit: Payer: Self-pay

## 2020-05-24 DIAGNOSIS — J432 Centrilobular emphysema: Secondary | ICD-10-CM | POA: Diagnosis not present

## 2020-05-24 DIAGNOSIS — L905 Scar conditions and fibrosis of skin: Secondary | ICD-10-CM | POA: Diagnosis not present

## 2020-05-24 DIAGNOSIS — R590 Localized enlarged lymph nodes: Secondary | ICD-10-CM | POA: Diagnosis not present

## 2020-05-24 DIAGNOSIS — B078 Other viral warts: Secondary | ICD-10-CM | POA: Diagnosis not present

## 2020-05-24 DIAGNOSIS — L249 Irritant contact dermatitis, unspecified cause: Secondary | ICD-10-CM | POA: Diagnosis not present

## 2020-05-24 DIAGNOSIS — C44729 Squamous cell carcinoma of skin of left lower limb, including hip: Secondary | ICD-10-CM | POA: Diagnosis not present

## 2020-05-24 DIAGNOSIS — L578 Other skin changes due to chronic exposure to nonionizing radiation: Secondary | ICD-10-CM | POA: Diagnosis not present

## 2020-05-24 DIAGNOSIS — D485 Neoplasm of uncertain behavior of skin: Secondary | ICD-10-CM | POA: Diagnosis not present

## 2020-05-24 DIAGNOSIS — F17210 Nicotine dependence, cigarettes, uncomplicated: Secondary | ICD-10-CM

## 2020-05-24 DIAGNOSIS — L570 Actinic keratosis: Secondary | ICD-10-CM | POA: Diagnosis not present

## 2020-05-24 DIAGNOSIS — I700 Atherosclerosis of aorta: Secondary | ICD-10-CM | POA: Diagnosis not present

## 2020-06-05 NOTE — Progress Notes (Signed)
Please call patient and let them  know their  low dose Ct was read as a Lung RADS 2: nodules that are benign in appearance and behavior with a very low likelihood of becoming a clinically active cancer due to size or lack of growth. Recommendation per radiology is for a repeat LDCT in 12 months. .Please let them  know we will order and schedule their  annual screening scan for 05/2021 Please let them  know there was notation of CAD on their  scan.  Please remind the patient  that this is a non-gated exam therefore degree or severity of disease  cannot be determined. Please have them  follow up with their PCP regarding potential risk factor modification, dietary therapy or pharmacologic therapy if clinically indicated. Pt.  is not  currently on statin therapy. Please place order for annual  screening scan for  05/2021 and fax results to PCP. Thanks so much.  Please have patient follow up with PCP about noted aortic atherosclerosis. They are not on a statin. Thanks so much

## 2020-06-07 ENCOUNTER — Telehealth: Payer: Self-pay | Admitting: Pulmonary Disease

## 2020-06-07 DIAGNOSIS — F17210 Nicotine dependence, cigarettes, uncomplicated: Secondary | ICD-10-CM

## 2020-06-07 NOTE — Telephone Encounter (Signed)
Pt informed of CT results per Eric Form, NP.  PT verbalized understanding.  Copy sent to PCP.  Order placed for 1 yr f/u CT. Pt states that she still has a prod cough since covid infection 04/30/20.  C/o prod cough with occas green sputum and chest congestion.  Denis FCS or SOB.  Occas wheezing with chest congestion.  Pt wasn't sure if this is what showed on previous lung screening CT and if she should be treated for it.  Please advise.  Lungs/Pleura: Several small pulmonary nodules are noted throughout the lungs bilaterally, largest of which is in the medial aspect of the right upper lobe near the apex (axial image 26 of series 3), with a volume derived mean diameter of 10.8 mm, similar in retrospect to prior studies. No other larger more suspicious appearing pulmonary nodules or masses are noted. Scattered areas of mucoid impaction are again evident in the lungs bilaterally, most apparent in the right lower lobe and medial aspect of the left upper lobe. No confluent consolidative airspace disease. No pleural effusions. Diffuse bronchial wall thickening with mild to moderate centrilobular and paraseptal emphysema.

## 2020-06-13 ENCOUNTER — Other Ambulatory Visit: Payer: Self-pay | Admitting: Family Medicine

## 2020-06-17 ENCOUNTER — Other Ambulatory Visit: Payer: Self-pay | Admitting: Pulmonary Disease

## 2020-06-20 DIAGNOSIS — L570 Actinic keratosis: Secondary | ICD-10-CM | POA: Diagnosis not present

## 2020-06-20 DIAGNOSIS — D0472 Carcinoma in situ of skin of left lower limb, including hip: Secondary | ICD-10-CM | POA: Diagnosis not present

## 2020-06-20 DIAGNOSIS — C44719 Basal cell carcinoma of skin of left lower limb, including hip: Secondary | ICD-10-CM | POA: Diagnosis not present

## 2020-06-20 NOTE — Telephone Encounter (Signed)
Called and spoke with pt letting her know the info stated by Dr. Halford Chessman and she verbalized understanding. Nothing further needed.

## 2020-06-20 NOTE — Telephone Encounter (Signed)
Please let her know that these findings are related to her history of COPD with emphysema and bronchiectasis.  She should try to quit smoking and continue inhaler therapy.  She can use mucinex and flutter valve to help with cough and respiratory secretions.  She will need follow up lung scan in January 2023.

## 2020-06-27 ENCOUNTER — Ambulatory Visit (INDEPENDENT_AMBULATORY_CARE_PROVIDER_SITE_OTHER): Payer: BC Managed Care – PPO | Admitting: Family Medicine

## 2020-06-27 ENCOUNTER — Other Ambulatory Visit: Payer: Self-pay

## 2020-06-27 ENCOUNTER — Other Ambulatory Visit: Payer: Self-pay | Admitting: Family Medicine

## 2020-06-27 ENCOUNTER — Encounter: Payer: Self-pay | Admitting: Family Medicine

## 2020-06-27 VITALS — BP 151/77 | HR 96 | Temp 99.1°F | Ht 64.0 in | Wt 113.0 lb

## 2020-06-27 DIAGNOSIS — I10 Essential (primary) hypertension: Secondary | ICD-10-CM | POA: Diagnosis not present

## 2020-06-27 DIAGNOSIS — Z8249 Family history of ischemic heart disease and other diseases of the circulatory system: Secondary | ICD-10-CM | POA: Diagnosis not present

## 2020-06-27 MED ORDER — LOSARTAN POTASSIUM 25 MG PO TABS: 25.0000 mg | ORAL_TABLET | Freq: Every day | ORAL | 1 refills | Status: DC

## 2020-06-27 NOTE — Patient Instructions (Signed)
Start losartan one tab daily in the morning.  Follow up in 4 weeks with provider fasting and we will collect labs

## 2020-06-27 NOTE — Progress Notes (Signed)
This visit occurred during the SARS-CoV-2 public health emergency.  Safety protocols were in place, including screening questions prior to the visit, additional usage of staff PPE, and extensive cleaning of exam room while observing appropriate contact time as indicated for disinfecting solutions.    [name] , 03-14-1956, 65 y.o., female MRN: [id number] Patient Care Team    Relationship Specialty Notifications Start End  Ma Hillock, DO PCP - General Family Medicine  04/23/15   Volanda Napoleon, MD Consulting Physician Oncology  05/12/13   Maisie Fus, MD Consulting Physician Obstetrics and Gynecology  01/01/15   Chesley Mires, MD Consulting Physician Pulmonary Disease  02/22/16   Carolan Clines, MD (Inactive) Consulting Physician Urology  04/21/16   Ayesha Mohair  Dermatology  07/04/16    Comment: [facility], Olevia Bowens, DO Consulting Physician Sports Medicine  05/17/18   Plonk, Noni Saupe, PA-C  Physician Assistant  05/25/18     Chief Complaint  Patient presents with  . Hypertension    147/88 with pt BP     Subjective: Pt presents for an OV to discuss her elevated blood pressures.  Patient reports she has been monitoring her blood pressures at home and she is seeing ranges in the 371I-967E systolic.  She reports when it is on the higher end she does feel foggy or swimmy headed and has a headache.  She has been taking extra aspirin secondary to being fearful of having a heart attack.  Patient is a smoker.  She has a family history of heart disease and stroke.  Depression screen [facility] 2/9 06/27/2020 11/21/2019 10/28/2017 08/10/2017  Decreased Interest 0 0 0 0  Down, Depressed, Hopeless 0 0 0 0  PHQ - 2 Score 0 0 0 0  Some recent data might be hidden    No Known Allergies Social History   Social History Narrative  . Not on file   Past Medical History:  Diagnosis Date  . Acute bursitis of right shoulder 09/16/2017   Injected  in November 13, 2017  . Acute lateral meniscal tear 09/09/2017  . Arthritis   . Avulsion fracture of lateral malleolus of right fibula 07/20/2018  . Basal cell carcinoma   . COPD (chronic obstructive pulmonary disease) ([place])   . DVT (deep venous thrombosis) ([place])   . History of acute bronchitis 03/24/2011  . Kidney stones   . Lymphadenopathy, abdominal 03/2013   Noted on abd/pelv CT done to eval slow-to-resolve pyelo  . Meniere disorder 01/23/2012  . Osteoporosis 12/2014   Dr. Nori Riis, her GYN, dx'd her  . Pneumonia   . Pyelonephritis 10/28/2017  . Recurrent pyelonephritis   . Seasonal allergic rhinitis   . Squamous cell carcinoma in situ (SCCIS) of skin of lower leg 05/04/2018   Dr. Janan Ridge, [facility]  . Squamous cell carcinoma in situ (SCCIS) of skin of lower leg 05/04/2018   Dr. Janan Ridge, [facility]  Invasive Squamous cell carcinoma margins of excision appear free of tumor in the sections examined.  Left Lateral Leg: Superficially invasive squamous cell carcinoma, extending to the deep margin.    Past Surgical History:  Procedure Laterality Date  . DEXA  12/2014   T score spine -2.4, hip -3.5 (Dr. Nori Riis, [facility].  [name] DILATION AND CURETTAGE OF UTERUS  2009  . SKIN LESION EXCISION Left 05/24/2018   SCC; Skin, Excision, Left Laeral leg: postsurgical  scar, no neoplasm is identified.  Skin, Shave bx and ED&C, right anterior thigh-Superficially invasive squamous cell carcinoma, extending to the deep margin.   Family History  Problem Relation Age of Onset  . Hypertension Mother   . Glaucoma Mother   . Irritable bowel syndrome Mother   . Arthritis Mother   . Kidney disease Mother   . Diabetes Mother   . Hypertension Father   . Heart disease Father        bypasses  . Cancer Father        sarcoma  . Heart disease Maternal Grandmother        CHF  . Stroke Maternal Grandmother        in his 20's  . Breast cancer Maternal Grandmother    . Stroke Maternal Grandfather   . Other Paternal Grandmother        hardening of the arteries  . Heart attack Paternal Grandfather   . Colon cancer Paternal Aunt 68  . Stomach cancer Neg Hx    Allergies as of 06/27/2020   No Known Allergies     Medication List       Accurate as of June 27, 2020  4:47 PM. If you have any questions, ask your nurse or doctor.        AeroChamber MV inhaler Use as instructed   alendronate 70 MG tablet Commonly known as: FOSAMAX TAKE 1 TABLET BY MOUTH EVERY 7 (SEVEN) DAYS. TAKE WITH A FULL GLASS OF WATER ON AN EMPTY STOMACH.   azelastine 0.1 % nasal spray Commonly known as: ASTELIN PLACE 1 SPRAY INTO BOTH NOSTRILS 2 (TWO) TIMES DAILY. USE IN EACH NOSTRIL AS DIRECTED   Duobrii 0.01-0.045 % Lotn Generic drug: Halobetasol Prop-Tazarotene Apply 1 a small amount to affected area once a day  to plantar feet until smooth x 8 weeks   estradiol 0.1 MG/GM vaginal cream Commonly known as: ESTRACE PLEASE SEE ATTACHED FOR DETAILED DIRECTIONS   Flutter Devi Twice a day and prn as needed, may increase if feeling worse   gabapentin 100 MG capsule Commonly known as: NEURONTIN TAKE 2 CAPSULES (200 MG TOTAL) BY MOUTH AT BEDTIME.   losartan 25 MG tablet Commonly known as: COZAAR Take 1 tablet (25 mg total) by mouth daily. Started by: Howard Pouch, DO   montelukast 10 MG tablet Commonly known as: SINGULAIR Take 1 tablet (10 mg total) by mouth at bedtime.   multivitamin tablet Take 1 tablet by mouth daily.   Trelegy Ellipta 100-62.5-25 MCG/INH Aepb Generic drug: Fluticasone-Umeclidin-Vilant INHALE 1 PUFF BY MOUTH EVERY DAY   tretinoin 0.05 % cream Commonly known as: RETIN-A APPLY TO AFFECTED AREA EVERY DAY AT BEDTIME **PA SENT**   triamcinolone ointment 0.1 % Commonly known as: KENALOG APPLY SMALL AMOUNT TO AFFECTED SPOTS ON LEGS TWICE A DAY X 1-2 WEEKS   Ventolin HFA 108 (90 Base) MCG/ACT inhaler Generic drug: albuterol TAKE 2 PUFFS  BY MOUTH EVERY 6 HOURS AS NEEDED FOR WHEEZE OR SHORTNESS OF BREATH   albuterol (2.5 MG/3ML) 0.083% nebulizer solution Commonly known as: PROVENTIL Take 3 mLs (2.5 mg total) by nebulization every 6 (six) hours as needed for wheezing or shortness of breath.   VITAMIN D (CHOLECALCIFEROL) PO Take 5,000 Units by mouth daily.       All past medical history, surgical history, allergies, family history, immunizations andmedications were updated in the EMR today and reviewed under the history and medication portions of their EMR.     ROS: Negative, with  the exception of above mentioned in HPI   Objective:  BP (!) 151/77 Comment: 147/88 with pt BP  Pulse 96   Temp 99.1 F (37.3 C) (Oral)   Ht 5\' 4"  (1.626 m)   Wt 113 lb (51.3 kg)   SpO2 95%   BMI 19.40 kg/m  Body mass index is 19.4 kg/m. Gen: Afebrile. No acute distress. Nontoxic in appearance, well developed, well nourished.  HENT: AT. [place].  Eyes:Pupils Equal Round Reactive to light, Extraocular movements intact,  Conjunctiva without redness, discharge or icterus. Neck/lymp/endocrine: Supple, no lymphadenopathy, thyromegaly CV: RRR no murmur, no edema Chest: CTAB, no wheeze or crackles. Good air movement, normal resp effort.   Neuro:  Normal gait. PERLA. EOMi. Alert. Oriented x3 Psych: Normal affect, dress and demeanor. Normal speech. Normal thought content and judgment.  No exam data present No results found. No results found for this or any previous visit (from the past 24 hour(s)).  Assessment/Plan: JADAN ROUILLARD is a 65 y.o. female present for OV for  Primary hypertension/family history of heart disease New onset hypertension. Goal blood pressure:<130/80 Start losartan 25 mg daily She was instructed on proper blood pressure monitoring technique today. Next appointment: CMP, lipid, TSH -Follow-up 4 weeks fasting with provider and labs.   Reviewed expectations re: course of current medical issues.  Discussed  self-management of symptoms.  Outlined signs and symptoms indicating need for more acute intervention.  Patient verbalized understanding and all questions were answered.  Patient received an After-Visit Summary.    No orders of the defined types were placed in this encounter.  Meds ordered this encounter  Medications  . losartan (COZAAR) 25 MG tablet    Sig: Take 1 tablet (25 mg total) by mouth daily.    Dispense:  90 tablet    Refill:  1   Referral Orders  No referral(s) requested today     Note is dictated utilizing voice recognition software. Although note has been proof read prior to signing, occasional typographical errors still can be missed. If any questions arise, please do not hesitate to call for verification.   electronically signed by:  Howard Pouch, DO  [place]

## 2020-06-27 NOTE — Telephone Encounter (Signed)
Please advise 

## 2020-06-28 MED ORDER — VALSARTAN 80 MG PO TABS: 80.0000 mg | ORAL_TABLET | Freq: Every day | ORAL | 0 refills | Status: DC

## 2020-06-28 NOTE — Telephone Encounter (Signed)
LVM to call back to discuss.

## 2020-06-28 NOTE — Telephone Encounter (Signed)
Patient advised and voiced understanding.  

## 2020-06-28 NOTE — Telephone Encounter (Signed)
Called in valsartan in place of backordered losartan.   Please inform patient of change secondary to backorder of her original medicine.  Please reassure her that valsartan is very similar to losartan, and both were in low doses.  Thanks

## 2020-06-30 DIAGNOSIS — K5792 Diverticulitis of intestine, part unspecified, without perforation or abscess without bleeding: Secondary | ICD-10-CM | POA: Diagnosis not present

## 2020-07-05 DIAGNOSIS — Z01419 Encounter for gynecological examination (general) (routine) without abnormal findings: Secondary | ICD-10-CM | POA: Diagnosis not present

## 2020-07-05 DIAGNOSIS — Z1231 Encounter for screening mammogram for malignant neoplasm of breast: Secondary | ICD-10-CM | POA: Diagnosis not present

## 2020-07-05 DIAGNOSIS — Z681 Body mass index (BMI) 19 or less, adult: Secondary | ICD-10-CM | POA: Diagnosis not present

## 2020-07-05 LAB — HM MAMMOGRAPHY

## 2020-07-05 LAB — HM PAP SMEAR: HM Pap smear: NORMAL

## 2020-07-19 DIAGNOSIS — L905 Scar conditions and fibrosis of skin: Secondary | ICD-10-CM | POA: Diagnosis not present

## 2020-07-19 DIAGNOSIS — D2371 Other benign neoplasm of skin of right lower limb, including hip: Secondary | ICD-10-CM | POA: Diagnosis not present

## 2020-07-19 DIAGNOSIS — Z85828 Personal history of other malignant neoplasm of skin: Secondary | ICD-10-CM | POA: Diagnosis not present

## 2020-07-19 DIAGNOSIS — L570 Actinic keratosis: Secondary | ICD-10-CM | POA: Diagnosis not present

## 2020-07-19 DIAGNOSIS — C44729 Squamous cell carcinoma of skin of left lower limb, including hip: Secondary | ICD-10-CM | POA: Diagnosis not present

## 2020-07-19 DIAGNOSIS — D225 Melanocytic nevi of trunk: Secondary | ICD-10-CM | POA: Diagnosis not present

## 2020-07-19 DIAGNOSIS — L820 Inflamed seborrheic keratosis: Secondary | ICD-10-CM | POA: Diagnosis not present

## 2020-07-19 DIAGNOSIS — D485 Neoplasm of uncertain behavior of skin: Secondary | ICD-10-CM | POA: Diagnosis not present

## 2020-07-23 ENCOUNTER — Other Ambulatory Visit: Payer: Self-pay | Admitting: Pulmonary Disease

## 2020-07-24 ENCOUNTER — Ambulatory Visit: Payer: BC Managed Care – PPO

## 2020-07-24 ENCOUNTER — Other Ambulatory Visit: Payer: Self-pay

## 2020-07-25 ENCOUNTER — Ambulatory Visit: Payer: BC Managed Care – PPO

## 2020-07-27 ENCOUNTER — Ambulatory Visit (INDEPENDENT_AMBULATORY_CARE_PROVIDER_SITE_OTHER): Payer: BC Managed Care – PPO | Admitting: Family Medicine

## 2020-07-27 ENCOUNTER — Encounter: Payer: Self-pay | Admitting: Family Medicine

## 2020-07-27 ENCOUNTER — Other Ambulatory Visit: Payer: Self-pay

## 2020-07-27 VITALS — BP 126/78 | HR 77 | Temp 98.7°F | Ht 64.0 in | Wt 110.0 lb

## 2020-07-27 DIAGNOSIS — F17200 Nicotine dependence, unspecified, uncomplicated: Secondary | ICD-10-CM | POA: Diagnosis not present

## 2020-07-27 DIAGNOSIS — Z131 Encounter for screening for diabetes mellitus: Secondary | ICD-10-CM | POA: Diagnosis not present

## 2020-07-27 DIAGNOSIS — R918 Other nonspecific abnormal finding of lung field: Secondary | ICD-10-CM | POA: Diagnosis not present

## 2020-07-27 DIAGNOSIS — Z1159 Encounter for screening for other viral diseases: Secondary | ICD-10-CM

## 2020-07-27 DIAGNOSIS — M810 Age-related osteoporosis without current pathological fracture: Secondary | ICD-10-CM

## 2020-07-27 DIAGNOSIS — I10 Essential (primary) hypertension: Secondary | ICD-10-CM

## 2020-07-27 DIAGNOSIS — Z8249 Family history of ischemic heart disease and other diseases of the circulatory system: Secondary | ICD-10-CM | POA: Insufficient documentation

## 2020-07-27 DIAGNOSIS — E538 Deficiency of other specified B group vitamins: Secondary | ICD-10-CM

## 2020-07-27 DIAGNOSIS — J411 Mucopurulent chronic bronchitis: Secondary | ICD-10-CM | POA: Diagnosis not present

## 2020-07-27 HISTORY — DX: Essential (primary) hypertension: I10

## 2020-07-27 LAB — COMPREHENSIVE METABOLIC PANEL
ALT: 11 U/L (ref 0–35)
AST: 22 U/L (ref 0–37)
Albumin: 4.2 g/dL (ref 3.5–5.2)
Alkaline Phosphatase: 71 U/L (ref 39–117)
BUN: 8 mg/dL (ref 6–23)
CO2: 32 mEq/L (ref 19–32)
Calcium: 9.7 mg/dL (ref 8.4–10.5)
Chloride: 99 mEq/L (ref 96–112)
Creatinine, Ser: 0.69 mg/dL (ref 0.40–1.20)
GFR: 91.78 mL/min (ref >60.00)
Glucose, Bld: 106 mg/dL — ABNORMAL HIGH (ref 70–99)
Potassium: 4.8 mEq/L (ref 3.5–5.1)
Sodium: 138 mEq/L (ref 135–145)
Total Bilirubin: 0.5 mg/dL (ref 0.2–1.2)
Total Protein: 7 g/dL (ref 6.0–8.3)

## 2020-07-27 LAB — CBC WITH DIFFERENTIAL/PLATELET
Basophils Absolute: 0.1 10*3/uL (ref 0.0–0.1)
Basophils Relative: 1.6 % (ref 0.0–3.0)
Eosinophils Absolute: 0.1 10*3/uL (ref 0.0–0.7)
Eosinophils Relative: 1.8 % (ref 0.0–5.0)
HCT: 49 % — ABNORMAL HIGH (ref 36.0–46.0)
Hemoglobin: 16.3 g/dL — ABNORMAL HIGH (ref 12.0–15.0)
Lymphocytes Relative: 30.9 % (ref 12.0–46.0)
Lymphs Abs: 2.1 10*3/uL (ref 0.7–4.0)
MCHC: 33.3 g/dL (ref 30.0–36.0)
MCV: 99.1 fl (ref 78.0–100.0)
Monocytes Absolute: 0.7 10*3/uL (ref 0.1–1.0)
Monocytes Relative: 10.5 % (ref 3.0–12.0)
Neutro Abs: 3.8 10*3/uL (ref 1.4–7.7)
Neutrophils Relative %: 55.2 % (ref 43.0–77.0)
Platelets: 208 10*3/uL (ref 150.0–400.0)
RBC: 4.95 Mil/uL (ref 3.87–5.11)
RDW: 13.5 % (ref 11.5–15.5)
WBC: 6.9 10*3/uL (ref 4.0–10.5)

## 2020-07-27 LAB — LIPID PANEL
Cholesterol: 191 mg/dL (ref 0–200)
HDL: 69.5 mg/dL (ref >39.00)
LDL Cholesterol: 103 mg/dL — ABNORMAL HIGH (ref 0–99)
NonHDL: 121.18
Total CHOL/HDL Ratio: 3
Triglycerides: 91 mg/dL (ref 0.0–149.0)
VLDL: 18.2 mg/dL (ref 0.0–40.0)

## 2020-07-27 LAB — TSH: TSH: 2.08 u[IU]/mL (ref 0.35–4.50)

## 2020-07-27 LAB — VITAMIN B12: Vitamin B-12: 594 pg/mL (ref 211–911)

## 2020-07-27 LAB — HEMOGLOBIN A1C: Hgb A1c MFr Bld: 5.6 % (ref 4.6–6.5)

## 2020-07-27 LAB — VITAMIN D 25 HYDROXY (VIT D DEFICIENCY, FRACTURES): VITD: 69.56 ng/mL (ref 30.00–100.00)

## 2020-07-27 MED ORDER — LEVOFLOXACIN 750 MG PO TABS: 750.0000 mg | ORAL_TABLET | Freq: Every day | ORAL | 0 refills | Status: DC

## 2020-07-27 MED ORDER — VALSARTAN 80 MG PO TABS: 80.0000 mg | ORAL_TABLET | Freq: Every day | ORAL | 1 refills | Status: DC

## 2020-07-27 MED ORDER — ALENDRONATE SODIUM 70 MG PO TABS: ORAL_TABLET | ORAL | 11 refills | Status: DC

## 2020-07-27 NOTE — Progress Notes (Signed)
This visit occurred during the SARS-CoV-2 public health emergency.  Safety protocols were in place, including screening questions prior to the visit, additional usage of staff PPE, and extensive cleaning of exam room while observing appropriate contact time as indicated for disinfecting solutions.    [name] , [birth year], 65 y.o., female MRN: [id number] Patient Care Team    Relationship Specialty Notifications Start End  Ma Hillock, DO PCP - General Family Medicine  04/23/15   Volanda Napoleon, MD Consulting Physician Oncology  05/12/13   Maisie Fus, MD Consulting Physician Obstetrics and Gynecology  01/01/15   Chesley Mires, MD Consulting Physician Pulmonary Disease  02/22/16   Carolan Clines, MD (Inactive) Consulting Physician Urology  04/21/16   Ayesha Mohair  Dermatology  07/04/16    Comment: [facility], Olevia Bowens, DO Consulting Physician Sports Medicine  05/17/18   Plonk, Noni Saupe, PA-C  Physician Assistant  05/25/18     Chief Complaint  Patient presents with  . Follow-up    Pt is fasting      Subjective: Pt presents for an OV on [facility] Primary hypertension/family history of heart disease Pt reports compliance with valsartan 80. Blood pressures ranges at home now normal. Patient denies chest pain, shortness of breath or lower extremity edema. Pt takes a  daily baby ASA. Pt is not prescribed statin. RF: htn, smoker, fhx heart disease.   Osteoporosis without current pathological fracture, unspecified osteoporosis type/Current every day smoker Patient last DEXA 04/08/2018 with osteoporosis.  She has been on Fosamax weekly dosing and is tolerating.  She has been encouraged to stop smoking, but currently smokes daily.  She is taking her vitamin D supplementation.  Mucopurulent chronic bronchitis ([place]) Patient reports approximately 4 days ago starting with a cough, right lower lobe wheezing.  There has been green-tinged  phlegm production with cough.  She denies fever or chills.  She has frequent bronchitis and pneumonia.  She is a current smoker.  She had a few doses of Levaquin left over and started Levaquin 3 days ago. She start LQ on her own at home   Pulmonary nodules/lesions, multiple/Abnormal CT scan of lung Follows with pulm   Depression screen [facility] 2/9 06/27/2020 11/21/2019 10/28/2017 08/10/2017  Decreased Interest 0 0 0 0  Down, Depressed, Hopeless 0 0 0 0  PHQ - 2 Score 0 0 0 0  Some recent data might be hidden    No Known Allergies Social History   Social History Narrative  . Not on file   Past Medical History:  Diagnosis Date  . Acute bursitis of right shoulder 09/16/2017   Injected in November 13, 2017  . Acute lateral meniscal tear 09/09/2017  . Arthritis   . Avulsion fracture of lateral malleolus of right fibula 07/20/2018  . Basal cell carcinoma   . COPD (chronic obstructive pulmonary disease) ([place])   . DVT (deep venous thrombosis) ([place])   . Essential hypertension 07/27/2020  . History of acute bronchitis 03/24/2011  . Kidney stones   . Lymphadenopathy, abdominal 03/2013   Noted on abd/pelv CT done to eval slow-to-resolve pyelo  . Meniere disorder 01/23/2012  . Osteoporosis 12/2014   Dr. Nori Riis, her GYN, dx'd her  . Pneumonia   . Pyelonephritis 10/28/2017  . Recurrent pyelonephritis   . Seasonal allergic rhinitis   . Squamous cell carcinoma in situ (SCCIS) of skin of lower leg 05/04/2018   [facility], [facility]  . Squamous cell carcinoma in situ (SCCIS) of skin of lower leg 05/04/2018   Dr. Janan Ridge, [facility]  Invasive Squamous cell carcinoma margins of excision appear free of tumor in the sections examined.  Left Lateral Leg: Superficially invasive squamous cell carcinoma, extending to the deep margin.    Past Surgical History:  Procedure Laterality Date  . DEXA  12/2014   T score spine -2.4, hip -3.5 (Dr. Nori Riis, [facility]   [name] DILATION AND CURETTAGE OF UTERUS  2009  . SKIN LESION EXCISION Left 05/24/2018   SCC; Skin, Excision, Left Laeral leg: postsurgical scar, no neoplasm is identified.  Skin, Shave bx and ED&C, right anterior thigh-Superficially invasive squamous cell carcinoma, extending to the deep margin.   Family History  Problem Relation Age of Onset  . Hypertension Mother   . Glaucoma Mother   . Irritable bowel syndrome Mother   . Arthritis Mother   . Kidney disease Mother   . Diabetes Mother   . Hypertension Father   . Heart disease Father        bypasses  . Cancer Father        sarcoma  . Heart disease Maternal Grandmother        CHF  . Stroke Maternal Grandmother        in his 34's  . Breast cancer Maternal Grandmother   . Stroke Maternal Grandfather   . Other Paternal Grandmother        hardening of the arteries  . Heart attack Paternal Grandfather   . Colon cancer Paternal Aunt 50  . Stomach cancer Neg Hx    Allergies as of 07/27/2020   No Known Allergies     Medication List       Accurate as of July 27, 2020 10:12 AM. If you have any questions, ask your nurse or doctor.        AeroChamber MV inhaler Use as instructed   alendronate 70 MG tablet Commonly known as: FOSAMAX TAKE 1 TABLET BY MOUTH EVERY 7 (SEVEN) DAYS. TAKE WITH A FULL GLASS OF WATER ON AN EMPTY STOMACH.   azelastine 0.1 % nasal spray Commonly known as: ASTELIN PLACE 1 SPRAY INTO BOTH NOSTRILS 2 (TWO) TIMES DAILY. USE IN EACH NOSTRIL AS DIRECTED   Duobrii 0.01-0.045 % Lotn Generic drug: Halobetasol Prop-Tazarotene Apply 1 a small amount to affected area once a day  to plantar feet until smooth x 8 weeks   estradiol 0.1 MG/GM vaginal cream Commonly known as: ESTRACE PLEASE SEE ATTACHED FOR DETAILED DIRECTIONS   Flutter Devi Twice a day and prn as needed, may increase if feeling worse   gabapentin 100 MG capsule Commonly known as: NEURONTIN TAKE 2 CAPSULES (200 MG TOTAL) BY MOUTH AT BEDTIME.    levofloxacin 750 MG tablet Commonly known as: Levaquin Take 1 tablet (750 mg total) by mouth daily. Started by: Howard Pouch, DO   montelukast 10 MG tablet Commonly known as: SINGULAIR TAKE 1 TABLET BY MOUTH EVERYDAY AT BEDTIME   multivitamin tablet Take 1 tablet by mouth daily.   Trelegy Ellipta 100-62.5-25 MCG/INH Aepb Generic drug: Fluticasone-Umeclidin-Vilant INHALE 1 PUFF BY MOUTH EVERY DAY   tretinoin 0.05 % cream Commonly known as: RETIN-A APPLY TO AFFECTED AREA EVERY DAY AT BEDTIME **PA SENT**   triamcinolone ointment 0.1 % Commonly known as: KENALOG APPLY SMALL AMOUNT TO AFFECTED SPOTS ON LEGS TWICE A DAY X 1-2 WEEKS   valsartan 80 MG tablet Commonly  known as: DIOVAN Take 1 tablet (80 mg total) by mouth daily.   Ventolin HFA 108 (90 Base) MCG/ACT inhaler Generic drug: albuterol TAKE 2 PUFFS BY MOUTH EVERY 6 HOURS AS NEEDED FOR WHEEZE OR SHORTNESS OF BREATH   albuterol (2.5 MG/3ML) 0.083% nebulizer solution Commonly known as: PROVENTIL Take 3 mLs (2.5 mg total) by nebulization every 6 (six) hours as needed for wheezing or shortness of breath.   VITAMIN D (CHOLECALCIFEROL) PO Take 5,000 Units by mouth daily.       All past medical history, surgical history, allergies, family history, immunizations andmedications were updated in the EMR today and reviewed under the history and medication portions of their EMR.     ROS: Negative, with the exception of above mentioned in HPI   Objective:  BP 126/78   Pulse 77   Temp 98.7 F (37.1 C) (Oral)   Ht 5\' 4"  (1.626 m)   Wt 110 lb (49.9 kg)   SpO2 97%   BMI 18.88 kg/m  Body mass index is 18.88 kg/m. Gen: Afebrile. No acute distress. Nontoxic, pleasant. Thin female.  HENT: AT. [place].  Eyes:Pupils Equal Round Reactive to light, Extraocular movements intact,  Conjunctiva without redness, discharge or icterus. Neck/lymp/endocrine: Supple,no lymphadenopathy, no thyromegaly CV: RRR no murmur, no edema, +2/4 P  posterior tibialis pulses Chest: RLL crackle present, good air movement.  Abd: Soft. NTND. BS present Neuro:  Normal gait. PERLA. EOMi. Alert. Oriented x3 Psych: Normal affect, dress and demeanor. Normal speech. Normal thought content and judgment.  No exam data present No results found. No results found for this or any previous visit (from the past 24 hour(s)).  Assessment/Plan: ITZAMAR TRAYNOR is a 65 y.o. female present for OV for  Primary hypertension/family history of heart disease Much improved. Continue valsartan 80 mg QD Goal blood pressure:<130/80 - CBC with Differential/Platelet - Comprehensive metabolic panel - Lipid panel - TSH - consider cardiac CT F/u 5.5 mos.   Osteoporosis without current pathological fracture, unspecified osteoporosis type/Current every day smoker - continue fosamax weekly - encouraged to stop smoking.  - Vitamin D (25 hydroxy) - dexa due 04/2021  Mucopurulent chronic bronchitis (HCC) RLL crackles present.  She start LQ on her own at home LQ x7 days total> prescribed #5 F/u pulm if symptoms remain  Pulmonary nodules/lesions, multiple/Abnormal CT scan of lung Follows with pulm Diabetes mellitus screening - Hemoglobin A1c B12 deficiency - B12 Need for hepatitis C screening test - Hepatitis C Antibody   Reviewed expectations re: course of current medical issues.  Discussed self-management of symptoms.  Outlined signs and symptoms indicating need for more acute intervention.  Patient verbalized understanding and all questions were answered.  Patient received an After-Visit Summary.    Orders Placed This Encounter  Procedures  . DG Bone Density  . CBC with Differential/Platelet  . Comprehensive metabolic panel  . Hemoglobin A1c  . Lipid panel  . TSH  . B12  . Hepatitis C Antibody  . Vitamin D (25 hydroxy)   Meds ordered this encounter  Medications  . valsartan (DIOVAN) 80 MG tablet    Sig: Take 1 tablet (80 mg total)  by mouth daily.    Dispense:  90 tablet    Refill:  1  . levofloxacin (LEVAQUIN) 750 MG tablet    Sig: Take 1 tablet (750 mg total) by mouth daily.    Dispense:  5 tablet    Refill:  0  . alendronate (FOSAMAX) 70 MG tablet  Sig: TAKE 1 TABLET BY MOUTH EVERY 7 (SEVEN) DAYS. TAKE WITH A FULL GLASS OF WATER ON AN EMPTY STOMACH.    Dispense:  4 tablet    Refill:  11    MUST HAVE OV FOR FURTHER REFILLS   Referral Orders  No referral(s) requested today     Note is dictated utilizing voice recognition software. Although note has been proof read prior to signing, occasional typographical errors still can be missed. If any questions arise, please do not hesitate to call for verification.   electronically signed by:  Howard Pouch, DO  [place]

## 2020-07-27 NOTE — Patient Instructions (Signed)
Bp looks great

## 2020-07-30 ENCOUNTER — Telehealth: Payer: Self-pay | Admitting: Family Medicine

## 2020-07-30 DIAGNOSIS — D751 Secondary polycythemia: Secondary | ICD-10-CM | POA: Insufficient documentation

## 2020-07-30 LAB — HEPATITIS C ANTIBODY
Hepatitis C Ab: NONREACTIVE
SIGNAL TO CUT-OFF: 0.01 (ref <1.00)

## 2020-07-30 NOTE — Telephone Encounter (Signed)
Results printed

## 2020-07-30 NOTE — Telephone Encounter (Signed)
Please fax patient's lab results from her office visit to Dr. Ledell Peoples office (dermatology) in care of Johney Maine, PA-per patient's request.  Thanks

## 2020-07-30 NOTE — Telephone Encounter (Signed)
Please call patient Liver, kidney and thyroid function are normal electrolytes are normal Diabetes screening/A1c is normal  Cholesterol panel looks great and at goal B12 and vitamin D levels in normal range White blood cells in normal range.  Hemoglobin and hematocrit are significantly high, 16.3 and 49 respectively.  She has had chronically  high hemoglobin/hematocrit in the past, and 2015 per EMR review.  This is a common finding in smokers, however her seems to be increasing.  Sometimes this can be from a condition called polycythemia vera.  Some patients need to see hematology in undergo phlebotomy (or blood donation) to keep hemoglobin levels in normal range -When levels increase, this can place the individual at higher risk for blood clots.  Adequate hydration, along with quitting smoking or even cutting back can help decrease these levels. If she can tolerate taking a baby aspirin daily, I would encourage her to start carotid protection from clots. She would like further evaluation I can refer her to hematology for their opinion.  Please advise of her decision

## 2020-07-30 NOTE — Telephone Encounter (Signed)
LM for pt to return call to discuss.  MyChart message sent with results.  

## 2020-07-31 DIAGNOSIS — D0472 Carcinoma in situ of skin of left lower limb, including hip: Secondary | ICD-10-CM | POA: Diagnosis not present

## 2020-07-31 NOTE — Telephone Encounter (Signed)
LVM for pt to CB regarding results.  

## 2020-07-31 NOTE — Telephone Encounter (Signed)
Forms faxed

## 2020-08-01 NOTE — Telephone Encounter (Signed)
noted 

## 2020-08-01 NOTE — Telephone Encounter (Signed)
Spoke with pt regarding labs and instructions. Pt declined referral

## 2020-08-13 ENCOUNTER — Other Ambulatory Visit: Payer: Self-pay | Admitting: Family Medicine

## 2020-08-23 ENCOUNTER — Ambulatory Visit
Admission: RE | Admit: 2020-08-23 | Discharge: 2020-08-23 | Disposition: A | Discharge status: Home / Self Care | Payer: BC Managed Care – PPO | Source: Ambulatory Visit | Attending: Family Medicine | Admitting: Family Medicine

## 2020-08-23 ENCOUNTER — Other Ambulatory Visit: Payer: Self-pay

## 2020-08-23 DIAGNOSIS — M810 Age-related osteoporosis without current pathological fracture: Secondary | ICD-10-CM | POA: Diagnosis not present

## 2020-08-23 DIAGNOSIS — Z780 Asymptomatic menopausal state: Secondary | ICD-10-CM | POA: Diagnosis not present

## 2020-08-27 ENCOUNTER — Telehealth: Payer: Self-pay | Admitting: Family Medicine

## 2020-08-27 NOTE — Telephone Encounter (Signed)
These inform patient her bone density scan has resulted with rather significant osteoporosis-with the area of most concern > left upper thigh/hip joint. That being said, her bone density is mildly improved from last study a couple years ago.  Continue Fosamax once weekly dosing. Other recommendations for patients with osteoporosis: 1.) Drink alcohol in moderation only: No more than 4 drinks a day for men and no more than 2 drinks a day for women.  2.) Decrease caffeine consumption to no  More than 2.5 cups of coffee a day or nor more than 5 cups of tea a day. 3.) Exercise: weight bearing (walking counts), strength and balance training. 4.)  Smoking cessation, if a smoker. 5.) Sunlight/Ultraviolet light exposure 30 minutes a day/5 days a week. 6.) Continue to maintain adequate vitamin D and calcium levels-which she has as of last labs in March.

## 2020-08-28 DIAGNOSIS — L400 Psoriasis vulgaris: Secondary | ICD-10-CM | POA: Diagnosis not present

## 2020-08-28 DIAGNOSIS — Z85828 Personal history of other malignant neoplasm of skin: Secondary | ICD-10-CM | POA: Diagnosis not present

## 2020-08-28 DIAGNOSIS — L570 Actinic keratosis: Secondary | ICD-10-CM | POA: Diagnosis not present

## 2020-08-28 DIAGNOSIS — L905 Scar conditions and fibrosis of skin: Secondary | ICD-10-CM | POA: Diagnosis not present

## 2020-08-28 NOTE — Telephone Encounter (Signed)
Spoke with patient regarding results/recommendations.  Pt states that her GYN is recommending that she go to specialist Sh[facility]. Would you agree with recommendations?

## 2020-08-28 NOTE — Telephone Encounter (Signed)
LM for pt to return call to discuss.  

## 2020-08-29 NOTE — Telephone Encounter (Signed)
I can refer her there if she desires.  He seems to be a specialist in bone density.   [name] is on osteoporosis treatment currently with her Fosamax.  The specialist could offer her a different type of osteoporosis treatment that is IV, called Reclast that is once a year, that we do not offer here. Otherwise any medications he may offer, may also be offered here-insurance willing.  Typically insurance will not pay for other modalities of treatment, unless the patient cannot tolerate or has failed the bisphosphonate class-which is the Fosamax she is currently taking and tolerating.  Again, this decisions up to her I be happy to place referral if she wants to see if they have anything added to offer for her osteoporosis.  Please advise of her decision

## 2020-08-29 NOTE — Telephone Encounter (Signed)
Pt opted to wait on referral and continue current treatment. If needed in future, referral can be discussed then.

## 2020-09-18 ENCOUNTER — Other Ambulatory Visit: Payer: Self-pay

## 2020-09-18 ENCOUNTER — Ambulatory Visit: Payer: BC Managed Care – PPO | Admitting: Pulmonary Disease

## 2020-09-18 ENCOUNTER — Ambulatory Visit (INDEPENDENT_AMBULATORY_CARE_PROVIDER_SITE_OTHER): Payer: BC Managed Care – PPO | Admitting: Primary Care

## 2020-09-18 ENCOUNTER — Encounter: Payer: Self-pay | Admitting: Primary Care

## 2020-09-18 VITALS — BP 134/86 | HR 74 | Temp 98.2°F | Ht 64.0 in | Wt 110.6 lb

## 2020-09-18 DIAGNOSIS — J449 Chronic obstructive pulmonary disease, unspecified: Secondary | ICD-10-CM

## 2020-09-18 DIAGNOSIS — F17210 Nicotine dependence, cigarettes, uncomplicated: Secondary | ICD-10-CM

## 2020-09-18 DIAGNOSIS — F17200 Nicotine dependence, unspecified, uncomplicated: Secondary | ICD-10-CM

## 2020-09-18 MED ORDER — TRELEGY ELLIPTA 100-62.5-25 MCG/INH IN AEPB: INHALATION_SPRAY | RESPIRATORY_TRACT | 5 refills | Status: DC

## 2020-09-18 NOTE — Progress Notes (Signed)
Reviewed and agree with assessment/plan.   Chesley Mires, MD [facility] Pulmonary/Critical Care 09/18/2020, 10:06 AM Pager:  [phone]

## 2020-09-18 NOTE — Progress Notes (Signed)
@Patient  ID: [name], female    DOB: 1956-03-25, 65 y.o.   MRN: [id number]  Chief Complaint  Patient presents with  . Follow-up    No current respiratory concerns.     Referring provider: Ma Hillock, DO  HPI: 65 year old female, current everyday smoker (40-pack-year history).  Past medical history significant for COPD, bronchiectasis, nocturnal hypoxia due to emphysema, hypertension, multiple pulmonary nodules, current everyday smoker.  Patient of Dr. Halford Chessman, last seen in office by pulmonary nurse practitioner on 03/21/2020.  Maintained on Trelegy Ellipta.  Previous [facility] pulmonary encounter: 03/21/2020  - Visit  65 year old female current smoker followed in our office for bronchiectasis and COPD.  Patient presenting today as a 81-month follow-up.  She reports no acute respiratory concerns.  She does not utilize her flutter valve as prescribed.  She reports that she uses it on as-needed basis especially she feels more congested.  She continues to smoke 1 pack/day.  She has received the first 2 COVID-19 vaccinations.  She is unsure if she will receive the booster vaccine.  She has received her flu vaccine.  She is followed in our office by Dr. Halford Chessman.  Patient is enrolled in the lung cancer screening program.  Next CT is due in December/2021.   09/18/2020- Interim hx  Patient presents today for 67-month follow-up for COPD. Since her last visit she has done well. She has no acute complaints.  Seasonal allergies and strong smells will exacerbate her symptoms. She only experiences dyspnea walking up inclines or carrying large/heavy bags. She has an occasional cough. She is compliant with Trelegy Ellipta 122mcg daily. She uses Albuterol rescue as needed only, approx 1-2 times a month. She is still smoking, contemplating quitting. Due for LDCT in January 2023, last scan showed lung RADS 2.  Denies f/c/s, chest tightness and wheezing.     No Known Allergies  Immunization History   Administered Date(s) Administered  . Influenza Inj Mdck Quad Pf 02/28/2020  . Influenza Split 04/09/2011  . Influenza Whole 02/02/2009  . Influenza,inj,Quad PF,6+ Mos 01/26/2014, 02/06/2017, 03/04/2018, 01/26/2019  . Influenza-Unspecified 02/02/2013, 02/24/2015, 04/03/2016  . Moderna Sars-Covid-2 Vaccination 07/19/2019, 08/16/2019  . Pneumococcal Polysaccharide-23 01/26/2019  . Zoster Recombinat (Shingrix) 03/04/2018    Past Medical History:  Diagnosis Date  . Acute bursitis of right shoulder 09/16/2017   Injected in November 13, 2017  . Acute lateral meniscal tear 09/09/2017  . Arthritis   . Avulsion fracture of lateral malleolus of right fibula 07/20/2018  . Basal cell carcinoma   . COPD (chronic obstructive pulmonary disease) ([place])   . DVT (deep venous thrombosis) ([place])   . Essential hypertension 07/27/2020  . History of acute bronchitis 03/24/2011  . Kidney stones   . Lymphadenopathy, abdominal 03/2013   Noted on abd/pelv CT done to eval slow-to-resolve pyelo  . Meniere disorder 01/23/2012  . Osteoporosis 12/2014   Dr. Nori Riis, her GYN, dx'd her  . Pneumonia   . Pyelonephritis 10/28/2017  . Recurrent pyelonephritis   . Seasonal allergic rhinitis   . Squamous cell carcinoma in situ (SCCIS) of skin of lower leg 05/04/2018   Dr. Janan Ridge, [facility]  . Squamous cell carcinoma in situ (SCCIS) of skin of lower leg 05/04/2018   Dr. Janan Ridge, [facility]  Invasive Squamous cell carcinoma margins of excision appear free of tumor in the sections examined.  Left Lateral Leg: Superficially invasive squamous cell carcinoma, extending to the deep margin.     Tobacco  History: Social History   Tobacco Use  Smoking Status Current Every Day Smoker  . Packs/day: 1.00  . Years: 40.00  . Pack years: 40.00  . Types: Cigarettes  . Start date: 06/01/1978  Smokeless Tobacco Never Used  Tobacco Comment   still smoking a pack a day as of 09/18/2020   Ready to quit:  Not Answered Counseling given: Not Answered Comment: still smoking a pack a day as of 09/18/2020   Outpatient Medications Prior to Visit  Medication Sig Dispense Refill  . albuterol (PROVENTIL) (2.5 MG/3ML) 0.083% nebulizer solution Take 3 mLs (2.5 mg total) by nebulization every 6 (six) hours as needed for wheezing or shortness of breath. 75 mL 1  . albuterol (VENTOLIN HFA) 108 (90 Base) MCG/ACT inhaler INHALE 1-2 PUFFS INTO THE LUNGS EVERY 4 (FOUR) HOURS AS NEEDED FOR WHEEZING OR SHORTNESS OF BREATH. 6.7 each 5  . alendronate (FOSAMAX) 70 MG tablet TAKE 1 TABLET BY MOUTH EVERY 7 (SEVEN) DAYS. TAKE WITH A FULL GLASS OF WATER ON AN EMPTY STOMACH. 4 tablet 11  . azelastine (ASTELIN) 0.1 % nasal spray PLACE 1 SPRAY INTO BOTH NOSTRILS 2 (TWO) TIMES DAILY. USE IN EACH NOSTRIL AS DIRECTED 30 mL 1  . DUOBRII 0.01-0.045 % LOTN Apply 1 a small amount to affected area once a day  to plantar feet until smooth x 8 weeks    . estradiol (ESTRACE) 0.1 MG/GM vaginal cream PLEASE SEE ATTACHED FOR DETAILED DIRECTIONS    . montelukast (SINGULAIR) 10 MG tablet TAKE 1 TABLET BY MOUTH EVERYDAY AT BEDTIME 90 tablet 1  . Multiple Vitamin (MULTIVITAMIN) tablet Take 1 tablet by mouth daily.    [name] Spacer/Aero-Holding Chambers (AEROCHAMBER MV) inhaler Use as instructed 1 each 2  . tretinoin (RETIN-A) 0.05 % cream APPLY TO AFFECTED AREA EVERY DAY AT BEDTIME **PA SENT**    . valsartan (DIOVAN) 80 MG tablet Take 1 tablet (80 mg total) by mouth daily. 90 tablet 1  . VITAMIN D, CHOLECALCIFEROL, PO Take 5,000 Units by mouth daily.     . TRELEGY ELLIPTA 100-62.5-25 MCG/INH AEPB INHALE 1 PUFF BY MOUTH EVERY DAY 60 each 4  . gabapentin (NEURONTIN) 100 MG capsule TAKE 2 CAPSULES (200 MG TOTAL) BY MOUTH AT BEDTIME. (Patient not taking: No sig reported)    . Respiratory Therapy Supplies (FLUTTER) DEVI Twice a day and prn as needed, may increase if feeling worse (Patient not taking: Reported on 09/18/2020) 1 each 0  . triamcinolone  ointment (KENALOG) 0.1 % APPLY SMALL AMOUNT TO AFFECTED SPOTS ON LEGS TWICE A DAY X 1-2 WEEKS (Patient not taking: Reported on 09/18/2020)    . levofloxacin (LEVAQUIN) 750 MG tablet Take 1 tablet (750 mg total) by mouth daily. 5 tablet 0   No facility-administered medications prior to visit.   Review of Systems  Review of Systems  Constitutional: Negative.   HENT: Positive for postnasal drip.   Respiratory: Positive for cough. Negative for chest tightness, shortness of breath and wheezing.        Dyspnea with moderate-heavy exertion   Cardiovascular: Negative.     Physical Exam  BP 134/86 (BP Location: Right Arm, Cuff Size: Normal)   Pulse 74   Temp 98.2 F (36.8 C) (Temporal)   Ht 5\' 4"  (1.626 m)   Wt 110 lb 9.6 oz (50.2 kg)   SpO2 96% Comment: RA  BMI 18.98 kg/m  Physical Exam Constitutional:      Appearance: Normal appearance.  HENT:     Head:  Normocephalic and atraumatic.     Mouth/Throat:     Mouth: Mucous membranes are moist.     Pharynx: Oropharynx is clear.  Cardiovascular:     Rate and Rhythm: Normal rate and regular rhythm.  Pulmonary:     Effort: Pulmonary effort is normal.     Breath sounds: Normal breath sounds. No wheezing, rhonchi or rales.     Comments: CTA Musculoskeletal:        General: Normal range of motion.  Skin:    General: Skin is warm and dry.     Comments: Skin abrasion left forearm   Neurological:     General: No focal deficit present.     Mental Status: She is alert and oriented to person, place, and time. Mental status is at baseline.  Psychiatric:        Mood and Affect: Mood normal.        Behavior: Behavior normal.        Thought Content: Thought content normal.        Judgment: Judgment normal.      Lab Results:  CBC    Component Value Date/Time   WBC 6.9 07/27/2020 1008   RBC 4.95 07/27/2020 1008   HGB 16.3 (H) 07/27/2020 1008   HGB 14.6 06/01/2013 1107   HCT 49.0 (H) 07/27/2020 1008   HCT 44.9 06/01/2013 1107   PLT  208.0 07/27/2020 1008   PLT 199 06/01/2013 1107   MCV 99.1 07/27/2020 1008   MCV 93 06/01/2013 1107   MCH 32.9 11/04/2018 1319   MCHC 33.3 07/27/2020 1008   RDW 13.5 07/27/2020 1008   RDW 13.6 06/01/2013 1107   LYMPHSABS 2.1 07/27/2020 1008   LYMPHSABS 2.5 06/01/2013 1107   MONOABS 0.7 07/27/2020 1008   EOSABS 0.1 07/27/2020 1008   EOSABS 0.0 06/01/2013 1107   BASOSABS 0.1 07/27/2020 1008   BASOSABS 0.0 06/01/2013 1107    BMET    Component Value Date/Time   NA 138 07/27/2020 1008   K 4.8 07/27/2020 1008   CL 99 07/27/2020 1008   CO2 32 07/27/2020 1008   GLUCOSE 106 (H) 07/27/2020 1008   BUN 8 07/27/2020 1008   CREATININE 0.69 07/27/2020 1008   CREATININE 0.72 03/18/2013 1212   CALCIUM 9.7 07/27/2020 1008   GFRNONAA >60 11/04/2018 1319   GFRAA >60 11/04/2018 1319    BNP No results found for: BNP  ProBNP No results found for: PROBNP  Imaging: DG Bone Density  Result Date: 08/24/2020 CLINICAL DATA:  Postmenopausal. EXAM: DUAL X-RAY ABSORPTIOMETRY (DXA) FOR BONE MINERAL DENSITY TECHNIQUE: Bone mineral density measurements are performed of the spine, hip, and forearm, as appropriate, per International Society of Clinical Densitometry recommendations. The pertinent regions of interest are reported below. Non-contributory values are not reported. Images are obtained for bone mineral density measurement and are not obtained for diagnostic purposes. FINDINGS: AP LUMBAR SPINE L1 through L3 Bone Mineral Density (BMD):  0.687 g/cm2 Young Adult T-Score:  -3.0 Z-Score:  -1.3 LEFT FEMUR NECK Bone Mineral Density (BMD):  0.444 g/cm2 Young Adult T-Score: -3.6 Z-Score:  -2.2 Unit: This study was performed at [facility] on the [place] (S/N [phone]), software version 13.4.2. Scan quality: The scan quality is good. Exclusions: L4 excluded, due to degenerative change. ASSESSMENT: Patient's diagnostic category is OSTEOPOROSIS by WHO Criteria. FRACTURE RISK: INCREASED FRAX: World  Health Organization FRAX assessment of absolute fracture risk is not calculated for this patient because the patient has osteoporosis. COMPARISON: None. RECOMMENDATIONS 1.  All patients should optimize calcium and vitamin D intake. 2. Consider FDA-approved medical therapies in postmenopausal women and men aged 40 years and older, based on the following: - A hip or vertebral (clinical or morphometric) fracture - T-score less than or equal to -2.5 at the femoral neck or spine after appropriate evaluation to exclude secondary causes - Low bone mass (T-score between -1.0 and -2.5 at the femoral neck or spine) and a 10-year probability of a hip fracture greater than or equal to 3% or a 10-year probability of a major osteoporosis-related fracture greater than or equal to 20% based on the [place]-adapted WHO algorithm - Clinician judgment and/or patient preferences may indicate treatment for people with 10-year fracture probabilities above or below these levels 3. Patients with diagnosis of osteoporosis or at high risk for fracture should have regular bone mineral density tests. For patients eligible for Medicare, routine testing is allowed once every 2 years. The testing frequency can be increased to one year for patients who have rapidly progressing disease, those who are receiving or discontinuing medical therapy to restore bone mass, or have additional risk factors. Electronically Signed   By: Lajean Manes M.D.   On: 08/24/2020 07:26     Assessment & Plan:   COPD (chronic obstructive pulmonary disease) (HCC) Stable interval; No recent exacerbations or hospitalizations. Rare SABA use. She experiences dyspnea with moderate-heavy exertion only. Occasional cough with little production. Lungs clear on exam, no overt rhonchi or wheezing. No changes today.   Recommendations:  - Continue Trelegy Ellipta-take 1 puff once daily every morning (rinse mouth after use) - Take mucinex twice a day and use flutter valve if you  develop bronchitis symptoms  - Continue to work on smoking cessation. Try decreasing amount, use nicotine replacement therapy and pick quit date  - Notify our office if you develop increased cough, increased sputum production or increased shortness of breath   Current every day smoker - Patient is still smoking 1ppd. She is contemplating quitting.  - Encourage patient taper amount she is smoking, using NRT and pick quit date - Due for follow-up LDCT in January 2023    Martyn Ehrich, NP 09/18/2020

## 2020-09-18 NOTE — Patient Instructions (Addendum)
Recommendations: - Continue Trelegy Ellipta-take 1 puff once daily every morning (rinse mouth after use) - Take mucinex twice a day and use flutter valve if you develop bronchitis symptoms  - Continue to work on smoking cessation. Try decreasing amount, use nicotine replacement therapy and pick quit date  - Due for follow-up LDCT in January 2023  - Notify our office if you develop increased cough, increased sputum production or increased shortness of breath  Follow-up: - 6 months with Dr. Halford Chessman or APP    COPD and Physical Activity Chronic obstructive pulmonary disease (COPD) is a long-term (chronic) condition that affects the lungs. COPD is a general term that can be used to describe many different lung problems that cause lung swelling (inflammation) and limit airflow, including chronic bronchitis and emphysema. The main symptom of COPD is shortness of breath, which makes it harder to do even simple tasks. This can also make it harder to exercise and be active. Talk with your health care provider about treatments to help you breathe better and actions you can take to prevent breathing problems during physical activity. What are the benefits of exercising with COPD? Exercising regularly is an important part of a healthy lifestyle. You can still exercise and do physical activities even though you have COPD. Exercise and physical activity improve your shortness of breath by increasing blood flow (circulation). This causes your heart to pump more oxygen through your body. Moderate exercise can improve your:  Oxygen use.  Energy level.  Shortness of breath.  Strength in your breathing muscles.  Heart health.  Sleep.  Self-esteem and feelings of self-worth.  Depression, stress, and anxiety levels. Exercise can benefit everyone with COPD. The severity of your disease may affect how hard you can exercise, especially at first, but everyone can benefit. Talk with your health care provider  about how much exercise is safe for you, and which activities and exercises are safe for you.   What actions can I take to prevent breathing problems during physical activity?  Sign up for a pulmonary rehabilitation program. This type of program may include: ? Education about lung diseases. ? Exercise classes that teach you how to exercise and be more active while improving your breathing. This usually involves:  Exercise using your lower extremities, such as a stationary bicycle.  About 30 minutes of exercise, 2 to 5 times per week, for 6 to 12 weeks  Strength training, such as push ups or leg lifts. ? Nutrition education. ? Group classes in which you can talk with others who also have COPD and learn ways to manage stress.  If you use an oxygen tank, you should use it while you exercise. Work with your health care provider to adjust your oxygen for your physical activity. Your resting flow rate is different from your flow rate during physical activity.  While you are exercising: ? Take slow breaths. ? Pace yourself and do not try to go too fast. ? Purse your lips while breathing out. Pursing your lips is similar to a kissing or whistling position. ? If doing exercise that uses a quick burst of effort, such as weight lifting:  Breathe in before starting the exercise.  Breathe out during the hardest part of the exercise (such as raising the weights). Where to find support You can find support for exercising with COPD from:  Your health care provider.  A pulmonary rehabilitation program.  Your local health department or community health programs.  Support groups, online or  in-person. Your health care provider may be able to recommend support groups. Where to find more information You can find more information about exercising with COPD from:  American Lung Association: ClassInsider.se.  COPD Foundation: https://www.rivera.net/. Contact a health care provider if:  Your symptoms get  worse.  You have chest pain.  You have nausea.  You have a fever.  You have trouble talking or catching your breath.  You want to start a new exercise program or a new activity. Summary  COPD is a general term that can be used to describe many different lung problems that cause lung swelling (inflammation) and limit airflow. This includes chronic bronchitis and emphysema.  Exercise and physical activity improve your shortness of breath by increasing blood flow (circulation). This causes your heart to provide more oxygen to your body.  Contact your health care provider before starting any exercise program or new activity. Ask your health care provider what exercises and activities are safe for you. This information is not intended to replace advice given to you by your health care provider. Make sure you discuss any questions you have with your health care provider. Document Revised: 08/11/2018 Document Reviewed: 05/14/2017 Elsevier Patient Education  2021 [employer].

## 2020-09-18 NOTE — Assessment & Plan Note (Signed)
Stable interval; No recent exacerbations or hospitalizations. Rare SABA use. She experiences dyspnea with moderate-heavy exertion only. Occasional cough with little production. Lungs clear on exam, no overt rhonchi or wheezing. No changes today.   Recommendations:  - Continue Trelegy Ellipta-take 1 puff once daily every morning (rinse mouth after use) - Take mucinex twice a day and use flutter valve if you develop bronchitis symptoms  - Continue to work on smoking cessation. Try decreasing amount, use nicotine replacement therapy and pick quit date  - Notify our office if you develop increased cough, increased sputum production or increased shortness of breath

## 2020-09-18 NOTE — Assessment & Plan Note (Signed)
-   Patient is still smoking 1ppd. She is contemplating quitting.  - Encourage patient taper amount she is smoking, using NRT and pick quit date - Due for follow-up LDCT in January 2023

## 2020-11-13 ENCOUNTER — Encounter: Payer: Self-pay | Admitting: Family Medicine

## 2020-11-13 ENCOUNTER — Telehealth: Payer: BC Managed Care – PPO | Admitting: Family Medicine

## 2020-11-13 ENCOUNTER — Other Ambulatory Visit: Payer: Self-pay

## 2020-11-13 ENCOUNTER — Telehealth (INDEPENDENT_AMBULATORY_CARE_PROVIDER_SITE_OTHER): Payer: BC Managed Care – PPO | Admitting: Family Medicine

## 2020-11-13 DIAGNOSIS — R059 Cough, unspecified: Secondary | ICD-10-CM | POA: Diagnosis not present

## 2020-11-13 DIAGNOSIS — J189 Pneumonia, unspecified organism: Secondary | ICD-10-CM

## 2020-11-13 DIAGNOSIS — H8109 Meniere's disease, unspecified ear: Secondary | ICD-10-CM

## 2020-11-13 MED ORDER — LEVOFLOXACIN 500 MG PO TABS: 500.0000 mg | ORAL_TABLET | Freq: Every day | ORAL | 0 refills | Status: AC

## 2020-11-13 MED ORDER — PREDNISONE 20 MG PO TABS: ORAL_TABLET | ORAL | 0 refills | Status: DC

## 2020-11-13 MED ORDER — MECLIZINE HCL 25 MG PO TABS: 25.0000 mg | ORAL_TABLET | Freq: Two times a day (BID) | ORAL | 5 refills | Status: DC | PRN

## 2020-11-13 NOTE — Progress Notes (Signed)
Error- please disregard

## 2020-11-13 NOTE — Progress Notes (Signed)
error 

## 2020-11-13 NOTE — Progress Notes (Signed)
VIRTUAL VISIT VIA VIDEO  I connected with [name] on 11/13/20 at  1:00 PM EDT by elemedicine application and verified that I am speaking with the correct person using two identifiers. Location patient: Home Location provider: [facility], Office Persons participating in the virtual visit: Patient, Dr. Raoul Pitch and Darnell Level. Cesar, CMA  I discussed the limitations of evaluation and management by telemedicine and the availability of in person appointments. The patient expressed understanding and agreed to proceed.   SUBJECTIVE Chief Complaint  Patient presents with   Headache    Pt c/o fever of 100.6, HA, chest/nasal congestion productive cough, chest tightness, post nasal drip x 1 week; neg covid yesterday     HPI: [name] is a 65 y.o. female present for acute illness of 1 week with negative covid test today. Pt endorses chest tightness, PND, cough and headache. She also endorses fever of 100.6 F.Pt has chronic COPD exacerbations.   ROS: See pertinent positives and negatives per HPI.  Patient Active Problem List   Diagnosis Date Noted   Polycythemia 07/30/2020   Essential hypertension 07/27/2020   Family history of heart disease 07/27/2020   Nocturnal hypoxemia due to emphysema ([place]) 08/10/2018   Nonallopathic lesion of lumbosacral region 01/12/2018   Nonallopathic lesion of sacral region 01/12/2018   Nonallopathic lesion of thoracic region 01/12/2018   Nonallopathic lesion of rib cage 01/12/2018   Nonallopathic lesion of cervical region 01/12/2018   Osteoporosis without current pathological fracture 10/28/2017   Abnormal CT scan of lung 04/23/2016   Pulmonary nodules/lesions, multiple 04/23/2016   Mediastinal adenopathy 02/22/2016   Current every day smoker 02/22/2016   Hematuria 06/07/2015   Meniere disorder 01/23/2012   COPD (chronic obstructive pulmonary disease) ([place]) 12/04/2008    Social History   Tobacco Use   Smoking status: Every Day    Packs/day:  1.00    Years: 40.00    Pack years: 40.00    Types: Cigarettes    Start date: 06/01/1978   Smokeless tobacco: Never   Tobacco comments:    still smoking a pack a day as of 09/18/2020  Substance Use Topics   Alcohol use: Yes    Alcohol/week: 7.0 standard drinks    Types: 7 Glasses of wine per week    Comment: 1 per day    Current Outpatient Medications:    albuterol (PROVENTIL) (2.5 MG/3ML) 0.083% nebulizer solution, Take 3 mLs (2.5 mg total) by nebulization every 6 (six) hours as needed for wheezing or shortness of breath., Disp: 75 mL, Rfl: 1   albuterol (VENTOLIN HFA) 108 (90 Base) MCG/ACT inhaler, INHALE 1-2 PUFFS INTO THE LUNGS EVERY 4 (FOUR) HOURS AS NEEDED FOR WHEEZING OR SHORTNESS OF BREATH., Disp: 6.7 each, Rfl: 5   alendronate (FOSAMAX) 70 MG tablet, TAKE 1 TABLET BY MOUTH EVERY 7 (SEVEN) DAYS. TAKE WITH A FULL GLASS OF WATER ON AN EMPTY STOMACH., Disp: 4 tablet, Rfl: 11   azelastine (ASTELIN) 0.1 % nasal spray, PLACE 1 SPRAY INTO BOTH NOSTRILS 2 (TWO) TIMES DAILY. USE IN EACH NOSTRIL AS DIRECTED, Disp: 30 mL, Rfl: 1   DUOBRII 0.01-0.045 % LOTN, Apply 1 a small amount to affected area once a day  to plantar feet until smooth x 8 weeks, Disp: , Rfl:    estradiol (ESTRACE) 0.1 MG/GM vaginal cream, PLEASE SEE ATTACHED FOR DETAILED DIRECTIONS, Disp: , Rfl:    Fluticasone-Umeclidin-Vilant (TRELEGY ELLIPTA) 100-62.5-25 MCG/INH AEPB, INHALE 1 PUFF BY MOUTH EVERY DAY, Disp: 60 each, Rfl: 5  gabapentin (NEURONTIN) 100 MG capsule, TAKE 2 CAPSULES (200 MG TOTAL) BY MOUTH AT BEDTIME. (Patient not taking: No sig reported), Disp: , Rfl:    montelukast (SINGULAIR) 10 MG tablet, TAKE 1 TABLET BY MOUTH EVERYDAY AT BEDTIME, Disp: 90 tablet, Rfl: 1   Multiple Vitamin (MULTIVITAMIN) tablet, Take 1 tablet by mouth daily., Disp: , Rfl:    Respiratory Therapy Supplies (FLUTTER) DEVI, Twice a day and prn as needed, may increase if feeling worse (Patient not taking: Reported on 09/18/2020), Disp: 1 each,  Rfl: 0   Spacer/Aero-Holding Chambers (AEROCHAMBER MV) inhaler, Use as instructed, Disp: 1 each, Rfl: 2   tretinoin (RETIN-A) 0.05 % cream, APPLY TO AFFECTED AREA EVERY DAY AT BEDTIME **PA SENT**, Disp: , Rfl:    triamcinolone ointment (KENALOG) 0.1 %, APPLY SMALL AMOUNT TO AFFECTED SPOTS ON LEGS TWICE A DAY X 1-2 WEEKS (Patient not taking: Reported on 09/18/2020), Disp: , Rfl:    valsartan (DIOVAN) 80 MG tablet, Take 1 tablet (80 mg total) by mouth daily., Disp: 90 tablet, Rfl: 1   VITAMIN D, CHOLECALCIFEROL, PO, Take 5,000 Units by mouth daily. , Disp: , Rfl:   No Known Allergies  OBJECTIVE: There were no vitals taken for this visit. Gen: No acute distress. Nontoxic in appearance.  HENT: AT. .  MMM.  Eyes:Pupils Equal Round Reactive to light, Extraocular movements intact,  Conjunctiva without redness, discharge or icterus. Chest: Cough present. No shortness of breath.  Skin: no rashes, purpura or petechiae.  Neuro: Alert. Oriented x3  Psych: Normal affect and demeanor. Normal speech. Normal thought content and judgment.  ASSESSMENT AND PLAN: [name] is a 65 y.o. female present for  Pneumonia/cough Rest, hydrate.  +/- flonase, mucinex (DM if cough), nettie pot or nasal saline.  LQ and pred prescribed, take until completed.  Smoking cessation encouraged F/U 2 weeks if not improved- sooner if worsening  Meniere d/o: Ativert BID PRN.   Note copied from frozen 11/02/2020-2pm slot for this pt. Epic issue.   Howard Pouch, DO 11/13/2020  Meds ordered this encounter  Medications   levofloxacin (LEVAQUIN) 500 MG tablet    Sig: Take 1 tablet (500 mg total) by mouth daily for 7 days.    Dispense:  7 tablet    Refill:  0   predniSONE (DELTASONE) 20 MG tablet    Sig: 60 mg x3d, 40 mg x3d, 20 mg x2d, 10 mg x2d    Dispense:  18 tablet    Refill:  0   meclizine (ANTIVERT) 25 MG tablet    Sig: Take 1 tablet (25 mg total) by mouth 2 (two) times daily as needed for dizziness.     Dispense:  30 tablet    Refill:  5   Orders Placed This Encounter  Procedures   DG Chest 2 View     Referral Orders  No referral(s) requested today

## 2020-11-19 IMAGING — US US RENAL
1 series · 14 of 25 positions shown · non-contrast
Comparison: None.

CLINICAL DATA: Right flank pain.

EXAM:
RENAL / URINARY TRACT ULTRASOUND COMPLETE

[Series 1: us renal · 0.20mm/px · 14 of 40 slices shown]
[im 1/40]
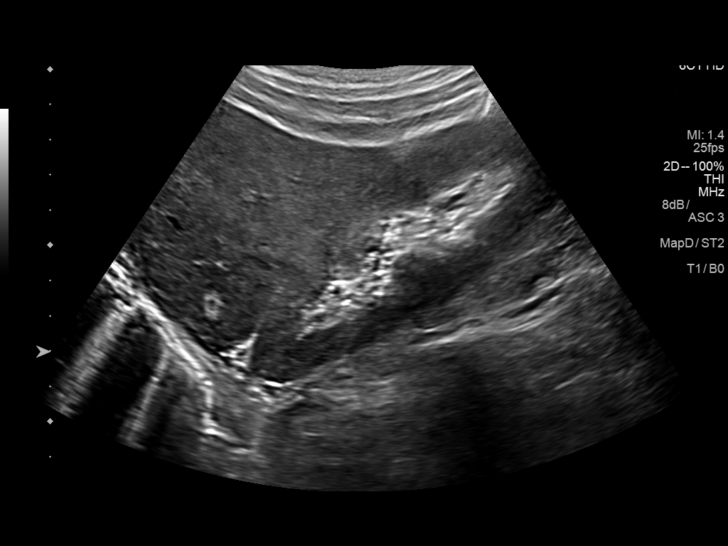
[im 4/40]
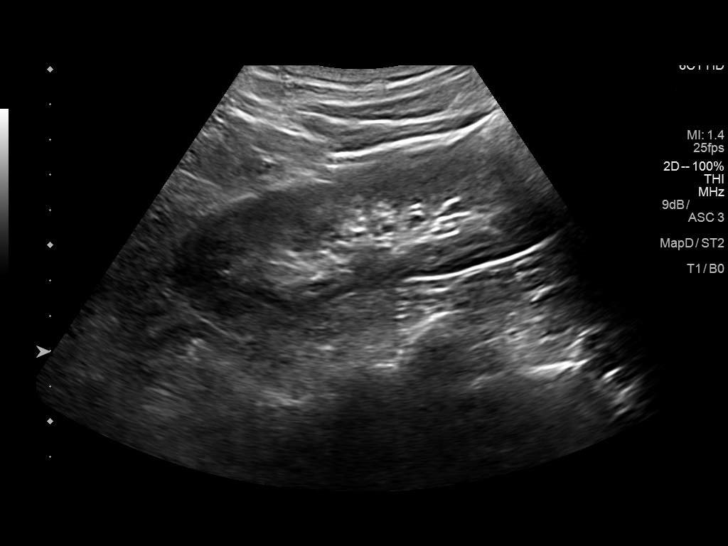
[im 7/40]
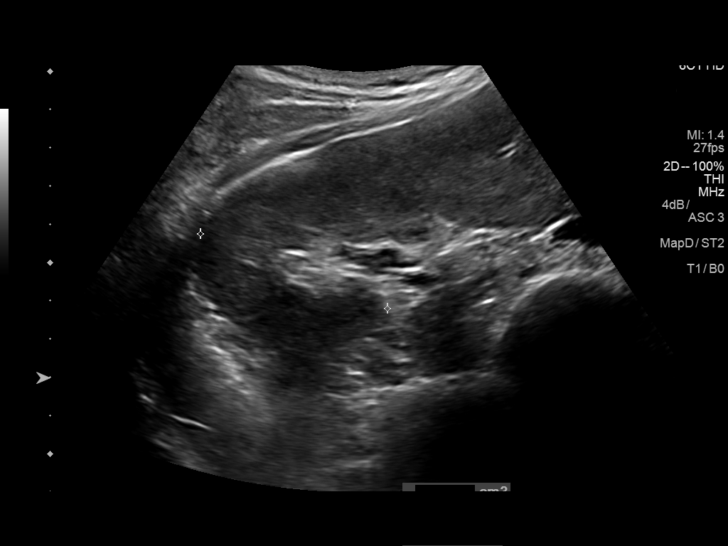
[im 10/40]
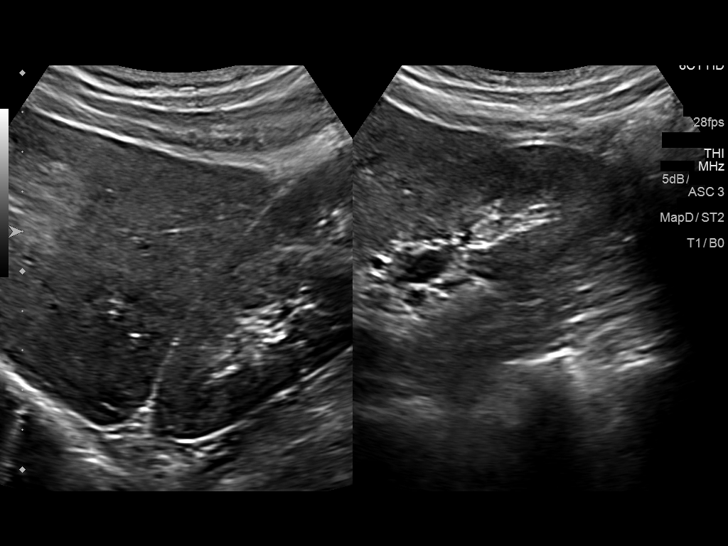
[im 14/40]
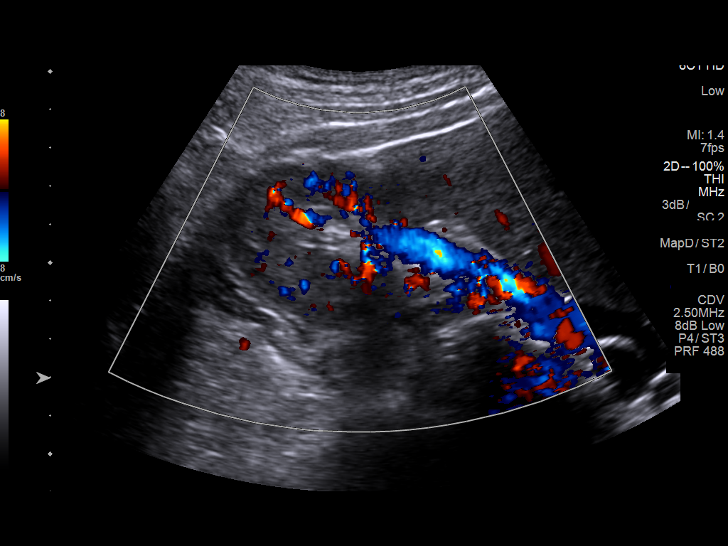
[im 15/40]
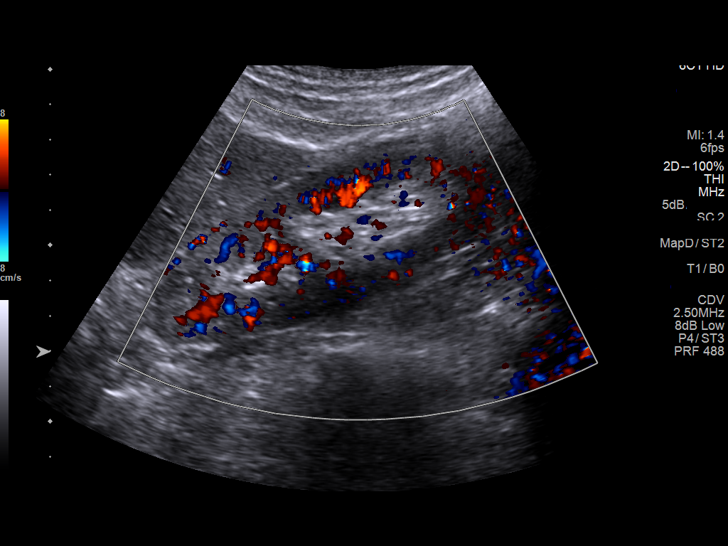
[im 18/40]
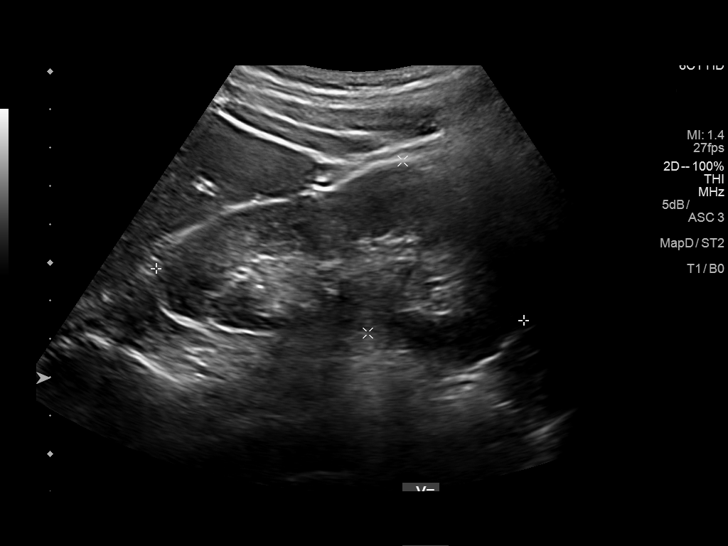
[im 22/40]
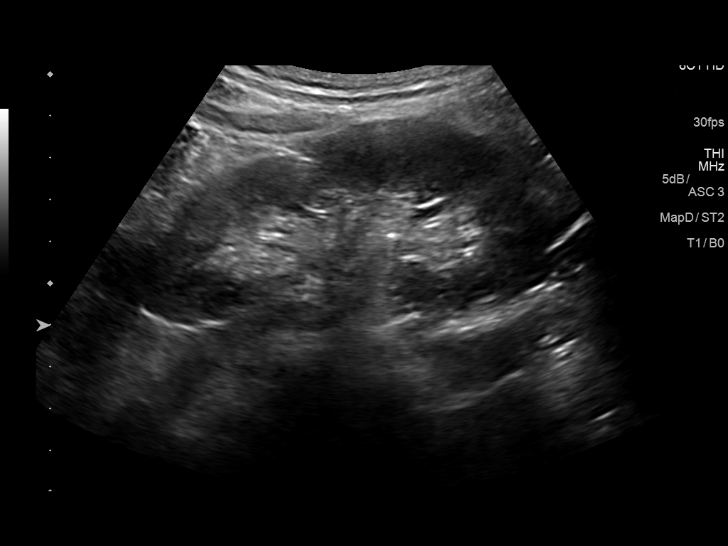
[im 25/40]
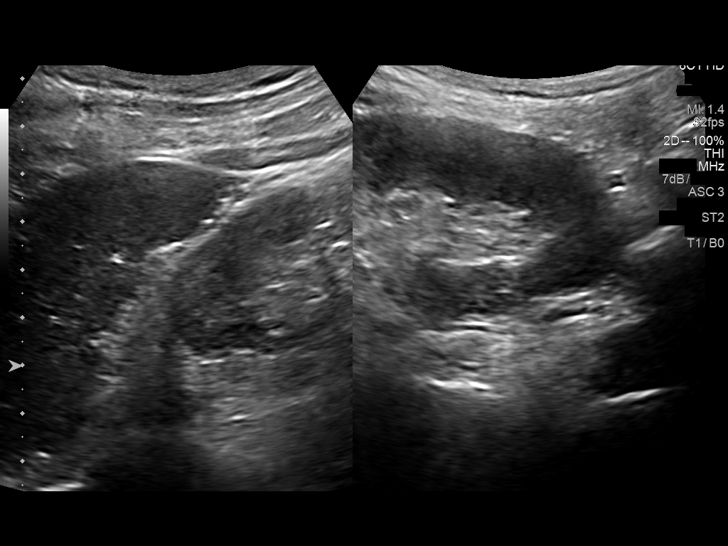
[im 27/40]
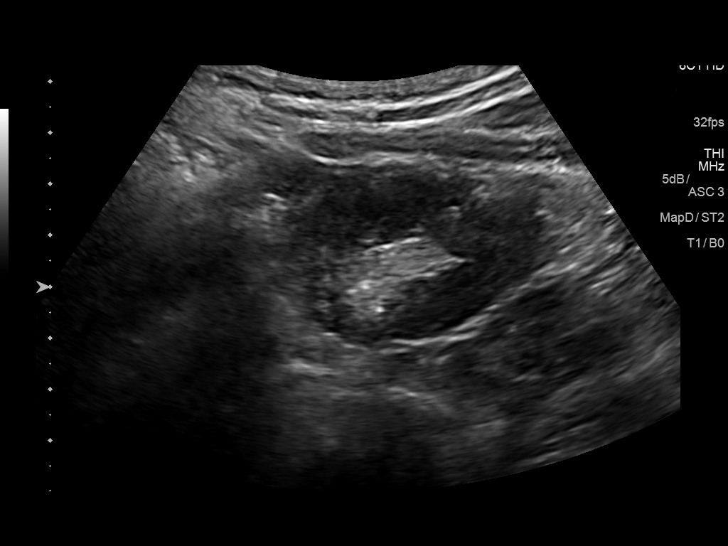
[im 30/40]
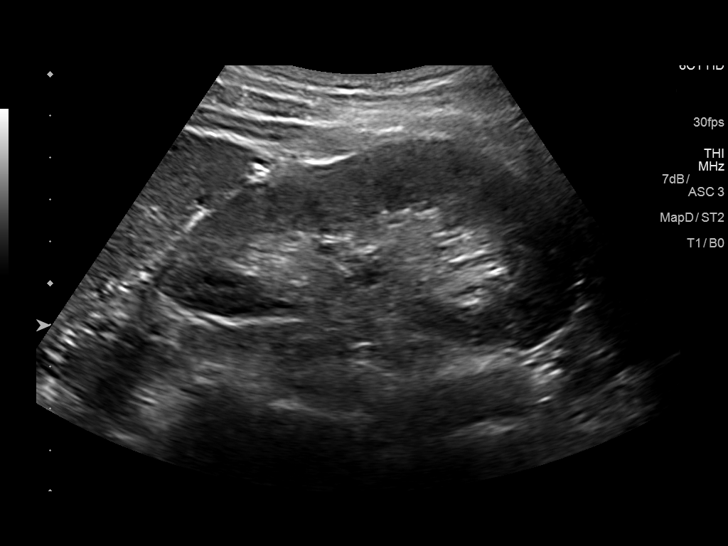
[im 33/40]
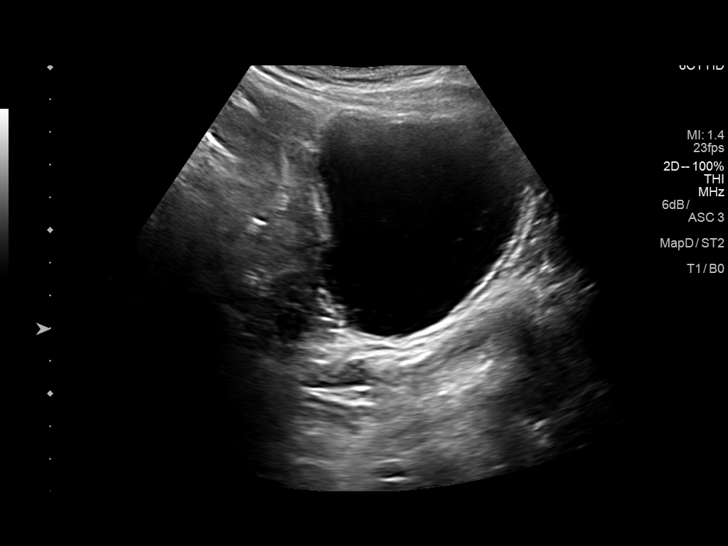
[im 36/40]
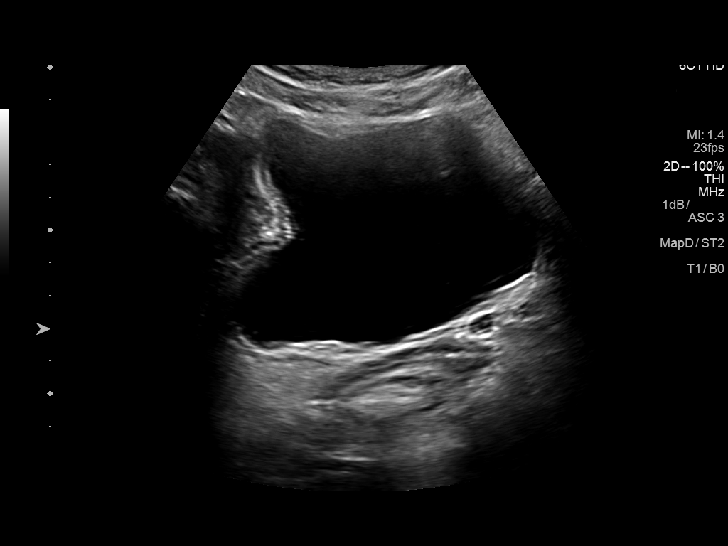
[im 40/40]
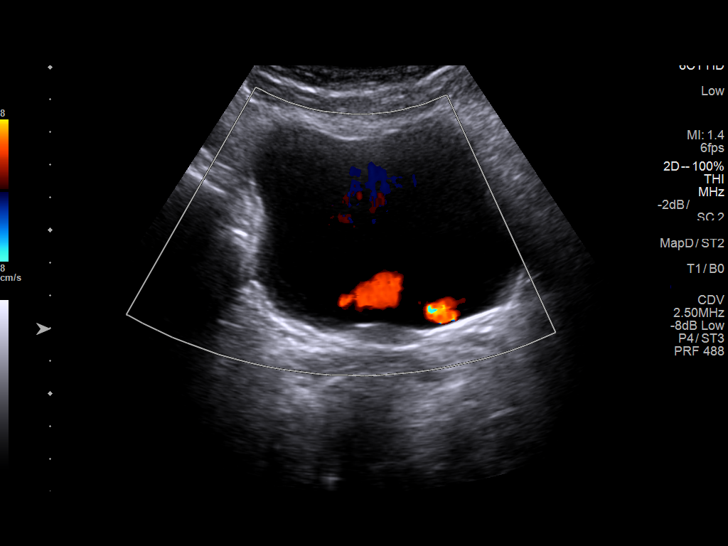

[14 of 25 positions shown; findings below may reference images not displayed]

FINDINGS: Right Kidney:

Renal measurements: 11.5 x 3.4 x 5.3 cm = volume: 109 mL .
Echogenicity within normal limits. No mass or hydronephrosis
visualized.

Left Kidney:

Renal measurements: 9.7 x 4.6 x 4.4 cm = volume: 103 mL.
Echogenicity within normal limits. No mass or hydronephrosis
visualized.

Bladder:

Appears normal for degree of bladder distention. Bilateral ureteral
jets are noted.
IMPRESSION: Normal renal ultrasound.

## 2021-01-14 ENCOUNTER — Other Ambulatory Visit: Payer: Self-pay

## 2021-01-14 ENCOUNTER — Encounter: Payer: Self-pay | Admitting: Family Medicine

## 2021-01-14 ENCOUNTER — Ambulatory Visit (INDEPENDENT_AMBULATORY_CARE_PROVIDER_SITE_OTHER): Payer: BC Managed Care – PPO | Admitting: Family Medicine

## 2021-01-14 VITALS — BP 135/81 | HR 79 | Temp 98.3°F | Ht 64.0 in | Wt 110.4 lb

## 2021-01-14 DIAGNOSIS — F17200 Nicotine dependence, unspecified, uncomplicated: Secondary | ICD-10-CM

## 2021-01-14 DIAGNOSIS — M818 Other osteoporosis without current pathological fracture: Secondary | ICD-10-CM

## 2021-01-14 DIAGNOSIS — Z8249 Family history of ischemic heart disease and other diseases of the circulatory system: Secondary | ICD-10-CM

## 2021-01-14 DIAGNOSIS — Z23 Encounter for immunization: Secondary | ICD-10-CM

## 2021-01-14 DIAGNOSIS — I10 Essential (primary) hypertension: Secondary | ICD-10-CM

## 2021-01-14 DIAGNOSIS — R918 Other nonspecific abnormal finding of lung field: Secondary | ICD-10-CM

## 2021-01-14 DIAGNOSIS — D751 Secondary polycythemia: Secondary | ICD-10-CM

## 2021-01-14 DIAGNOSIS — J411 Mucopurulent chronic bronchitis: Secondary | ICD-10-CM

## 2021-01-14 MED ORDER — MECLIZINE HCL 25 MG PO TABS: 25.0000 mg | ORAL_TABLET | Freq: Two times a day (BID) | ORAL | 5 refills | Status: DC | PRN

## 2021-01-14 MED ORDER — AZELASTINE HCL 0.1 % NA SOLN: 1.0000 | Freq: Two times a day (BID) | NASAL | 11 refills | Status: DC

## 2021-01-14 MED ORDER — ALENDRONATE SODIUM 70 MG PO TABS: ORAL_TABLET | ORAL | 11 refills | Status: DC

## 2021-01-14 MED ORDER — ALBUTEROL SULFATE HFA 108 (90 BASE) MCG/ACT IN AERS: INHALATION_SPRAY | RESPIRATORY_TRACT | 5 refills | Status: DC

## 2021-01-14 MED ORDER — VALSARTAN 80 MG PO TABS: 120.0000 mg | ORAL_TABLET | Freq: Every day | ORAL | 1 refills | Status: DC

## 2021-01-14 MED ORDER — IPRATROPIUM BROMIDE 0.06 % NA SOLN: 2.0000 | Freq: Four times a day (QID) | NASAL | 12 refills | Status: DC

## 2021-01-14 NOTE — Patient Instructions (Addendum)
Great to see you today.  I have refilled the medication(s) we provide.  Increase BP med to 1.5 tans daily for better coverage.   Next appt 6 months for your yearly physical/preventive appt  with labs, and BP follow up.  Come fasting if able.

## 2021-01-14 NOTE — Progress Notes (Signed)
[name] , [birth year], 65 y.o., female MRN: [id number] Patient Care Team    Relationship Specialty Notifications Start End  Ma Hillock, DO PCP - General Family Medicine  04/23/15   Volanda Napoleon, MD Consulting Physician Oncology  05/12/13   Maisie Fus, MD Consulting Physician Obstetrics and Gynecology  01/01/15   Chesley Mires, MD Consulting Physician Pulmonary Disease  02/22/16   Carolan Clines, MD (Inactive) Consulting Physician Urology  04/21/16   Ayesha Mohair  Dermatology  07/04/16    Comment: [facility], Olevia Bowens, DO Consulting Physician Sports Medicine  05/17/18   Plonk, Noni Saupe, PA-C  Physician Assistant  05/25/18     Chief Complaint  Patient presents with   Hypertension    6 mo f/u HTN.      Subjective: Pt presents for an OV on [facility] Primary hypertension/family history of heart disease Pt reports compliance with valsartan 80. Blood pressures ranges at home not routinely checked.  Patient denies chest pain, shortness of breath, dizziness or lower extremity edema.  Pt takes a  daily baby ASA. Pt is not prescribed statin. RF: htn, smoker, fhx heart disease.   Osteoporosis without current pathological fracture, unspecified osteoporosis type/Current every day smoker Patient last DEXA 08/2020 with osteoporosis.  She has been on Fosamax weekly dosing and is tolerating.  She has been encouraged to stop smoking, but currently continues to smoke daily.  She is taking her vitamin D supplementation.    COPD /pulmonary nodules/lesions, multiple/Abnormal CT scan of lung Follows with pulm   Depression screen [facility] 2/9 06/27/2020 11/21/2019 10/28/2017 08/10/2017  Decreased Interest 0 0 0 0  Down, Depressed, Hopeless 0 0 0 0  PHQ - 2 Score 0 0 0 0  Some recent data might be hidden    No Known Allergies Social History   Social History Narrative   Not on file   Past Medical History:  Diagnosis Date   Acute bursitis  of right shoulder 09/16/2017   Injected in November 13, 2017   Acute lateral meniscal tear 09/09/2017   Arthritis    Avulsion fracture of lateral malleolus of right fibula 07/20/2018   Basal cell carcinoma    COPD (chronic obstructive pulmonary disease) ([facility])    DVT (deep venous thrombosis) ([place])    Essential hypertension 07/27/2020   History of acute bronchitis 03/24/2011   Kidney stones    Lymphadenopathy, abdominal 03/2013   Noted on abd/pelv CT done to eval slow-to-resolve pyelo   Meniere disorder 01/23/2012   Osteoporosis 12/2014   Dr. Nori Riis, her GYN, dx'd her   Pneumonia    Pyelonephritis 10/28/2017   Recurrent pyelonephritis    Seasonal allergic rhinitis    Squamous cell carcinoma in situ (SCCIS) of skin of lower leg 05/04/2018   Dr. Janan Ridge, [facility]   Squamous cell carcinoma in situ (SCCIS) of skin of lower leg 05/04/2018   Dr. Janan Ridge, [facility]  Invasive Squamous cell carcinoma margins of excision appear free of tumor in the sections examined.  Left Lateral Leg: Superficially invasive squamous cell carcinoma, extending to the deep margin.    Past Surgical History:  Procedure Laterality Date   DEXA  12/2014   T score spine -2.4, hip -3.5 (Dr. Nori Riis, [facility].   DILATION AND CURETTAGE OF UTERUS  2009   SKIN LESION EXCISION Left 05/24/2018   SCC; Skin, Excision, Left Laeral  leg: postsurgical scar, no neoplasm is identified.  Skin, Shave bx and ED&C, right anterior thigh-Superficially invasive squamous cell carcinoma, extending to the deep margin.   Family History  Problem Relation Age of Onset   Hypertension Mother    Glaucoma Mother    Irritable bowel syndrome Mother    Arthritis Mother    Kidney disease Mother    Diabetes Mother    Hypertension Father    Heart disease Father        bypasses   Cancer Father        sarcoma   Heart disease Maternal Grandmother        CHF   Stroke Maternal Grandmother        in  his 53's   Breast cancer Maternal Grandmother    Stroke Maternal Grandfather    Other Paternal Grandmother        hardening of the arteries   Heart attack Paternal Grandfather    Colon cancer Paternal Aunt 70   Stomach cancer Neg Hx    Allergies as of 01/14/2021   No Known Allergies      Medication List        Accurate as of January 14, 2021  5:36 PM. If you have any questions, ask your nurse or doctor.          STOP taking these medications    predniSONE 20 MG tablet Commonly known as: DELTASONE Stopped by: Howard Pouch, DO       TAKE these medications    AeroChamber MV inhaler Use as instructed   albuterol (2.5 MG/3ML) 0.083% nebulizer solution Commonly known as: PROVENTIL Take 3 mLs (2.5 mg total) by nebulization every 6 (six) hours as needed for wheezing or shortness of breath.   albuterol 108 (90 Base) MCG/ACT inhaler Commonly known as: VENTOLIN HFA INHALE 1-2 PUFFS INTO THE LUNGS EVERY 4 (FOUR) HOURS AS NEEDED FOR WHEEZING OR SHORTNESS OF BREATH.   alendronate 70 MG tablet Commonly known as: FOSAMAX TAKE 1 TABLET BY MOUTH EVERY 7 (SEVEN) DAYS. TAKE WITH A FULL GLASS OF WATER ON AN EMPTY STOMACH.   azelastine 0.1 % nasal spray Commonly known as: ASTELIN Place 1 spray into both nostrils 2 (two) times daily. Use in each nostril as directed   clobetasol ointment 0.05 % Commonly known as: TEMOVATE Apply topically at bedtime as needed.   Duobrii 0.01-0.045 % Lotn Generic drug: Halobetasol Prop-Tazarotene Apply 1 a small amount to affected area once a day  to plantar feet until smooth x 8 weeks   estradiol 0.1 MG/GM vaginal cream Commonly known as: ESTRACE PLEASE SEE ATTACHED FOR DETAILED DIRECTIONS   Flutter Devi Twice a day and prn as needed, may increase if feeling worse   gabapentin 100 MG capsule Commonly known as: NEURONTIN TAKE 2 CAPSULES (200 MG TOTAL) BY MOUTH AT BEDTIME.   ipratropium 0.06 % nasal spray Commonly known as:  ATROVENT Place 2 sprays into both nostrils 4 (four) times daily. Started by: Howard Pouch, DO   meclizine 25 MG tablet Commonly known as: ANTIVERT Take 1 tablet (25 mg total) by mouth 2 (two) times daily as needed for dizziness.   montelukast 10 MG tablet Commonly known as: SINGULAIR TAKE 1 TABLET BY MOUTH EVERYDAY AT BEDTIME   multivitamin tablet Take 1 tablet by mouth daily.   Trelegy Ellipta 100-62.5-25 MCG/INH Aepb Generic drug: Fluticasone-Umeclidin-Vilant INHALE 1 PUFF BY MOUTH EVERY DAY   tretinoin 0.05 % cream Commonly known as: RETIN-A APPLY TO AFFECTED AREA  EVERY DAY AT BEDTIME **PA SENT**   triamcinolone ointment 0.1 % Commonly known as: KENALOG   valsartan 80 MG tablet Commonly known as: DIOVAN Take 1.5 tablets (120 mg total) by mouth daily. What changed: how much to take Changed by: Howard Pouch, DO   VITAMIN D (CHOLECALCIFEROL) PO Take 5,000 Units by mouth daily.        All past medical history, surgical history, allergies, family history, immunizations andmedications were updated in the EMR today and reviewed under the history and medication portions of their EMR.     ROS: Negative, with the exception of above mentioned in HPI   Objective:  BP 135/81   Pulse 79   Temp 98.3 F (36.8 C) (Temporal)   Ht '5\' 4"'$  (1.626 m)   Wt 110 lb 6.4 oz (50.1 kg)   SpO2 96%   BMI 18.95 kg/m  Body mass index is 18.95 kg/m. Gen: Afebrile. No acute distress.  Very Pleasant, thin Caucasian female. HENT: AT. [place]. Bilateral TM visualized and normal in appearance. MMM. Bilateral nares without erythema or swelling. Throat without erythema or exudates.  Chronic cough present. Eyes:Pupils Equal Round Reactive to light, Extraocular movements intact,  Conjunctiva without redness, discharge or icterus. Neck/lymp/endocrine: Supple, no lymphadenopathy, no thyromegaly CV: RRR no murmur, no edema Chest: CTAB, no wheeze or crackles Skin: No rashes, purpura or petechiae.   Neuro:  Normal gait. PERLA. EOMi. Alert. Oriented x3 Psych: Normal affect, dress and demeanor. Normal speech. Normal thought content and judgment.    No results found. No results found. No results found for this or any previous visit (from the past 24 hour(s)).  Assessment/Plan: NALAYAH OHLIN is a 65 y.o. female present for OV for  Primary hypertension/family history of heart disease Higher than goal.   Increase valsartan 80 mg tab to 1.5 tabs daily Goal blood pressure:<130/80 - consider cardiac CT - labs UTD 07/2020 F/u 5.5 mos.   Osteoporosis without current pathological fracture, unspecified osteoporosis type/Current every day smoker -Continue fosamax weekly - encouraged to stop smoking.  - Vitamin D (25 hydroxy) utd 07/2020 (69.5) - dexa due 14/2024 (last -3.6)  COPD/pulmonary nodules/lesions, multiple/Abnormal CT scan of lung Follows with pulm Lungs are clear today.  Discussed smoker's cough and chronic mucopurulent bronchitis (COPD) from smoking is the cause of her increased mucus production and chronic cough. Encouraged her to continue allergy regimen and refilled her Astelin nasal spray today. Trial of Atrovent nasal spray 4 times daily as needed. Continue follow-up with pulm for management of COPD/inhalers. Encouraged her to cut back on smoking and consider quitting.  Elevated hemoglobin: Revisited discussion on her elevated hemoglobin in likely causes her tobacco use.  She has had chronic elevated hemoglobin dating back as far as 2015, that has steadily increased over the last 2-3 years.  She had declined referral to hematology in March 2022 for this condition. Decreasing tobacco use will help decrease hemoglobin. Encouraged hydration. Discussed possible benefits of daily baby aspirin.   Influenza vaccine and Shingrix No. 2 completed today.  Reviewed expectations re: course of current medical issues. Discussed self-management of symptoms. Outlined signs and  symptoms indicating need for more acute intervention. Patient verbalized understanding and all questions were answered. Patient received an After-Visit Summary.    Orders Placed This Encounter  Procedures   Flu Vaccine QUAD 6+ mos PF IM (Fluarix Quad PF)   Varicella-zoster vaccine IM (Shingrix)    Meds ordered this encounter  Medications   albuterol (VENTOLIN HFA) 108 (90  Base) MCG/ACT inhaler    Sig: INHALE 1-2 PUFFS INTO THE LUNGS EVERY 4 (FOUR) HOURS AS NEEDED FOR WHEEZING OR SHORTNESS OF BREATH.    Dispense:  6.7 each    Refill:  5   meclizine (ANTIVERT) 25 MG tablet    Sig: Take 1 tablet (25 mg total) by mouth 2 (two) times daily as needed for dizziness.    Dispense:  30 tablet    Refill:  5   valsartan (DIOVAN) 80 MG tablet    Sig: Take 1.5 tablets (120 mg total) by mouth daily.    Dispense:  135 tablet    Refill:  1   alendronate (FOSAMAX) 70 MG tablet    Sig: TAKE 1 TABLET BY MOUTH EVERY 7 (SEVEN) DAYS. TAKE WITH A FULL GLASS OF WATER ON AN EMPTY STOMACH.    Dispense:  4 tablet    Refill:  11    MUST HAVE OV FOR FURTHER REFILLS   azelastine (ASTELIN) 0.1 % nasal spray    Sig: Place 1 spray into both nostrils 2 (two) times daily. Use in each nostril as directed    Dispense:  30 mL    Refill:  11   ipratropium (ATROVENT) 0.06 % nasal spray    Sig: Place 2 sprays into both nostrils 4 (four) times daily.    Dispense:  15 mL    Refill:  12    Referral Orders  No referral(s) requested today     Note is dictated utilizing voice recognition software. Although note has been proof read prior to signing, occasional typographical errors still can be missed. If any questions arise, please do not hesitate to call for verification.   electronically signed by:  Howard Pouch, DO  [place]

## 2021-01-26 ENCOUNTER — Other Ambulatory Visit: Payer: Self-pay | Admitting: Pulmonary Disease

## 2021-02-27 DIAGNOSIS — L720 Epidermal cyst: Secondary | ICD-10-CM | POA: Diagnosis not present

## 2021-02-27 DIAGNOSIS — L814 Other melanin hyperpigmentation: Secondary | ICD-10-CM | POA: Diagnosis not present

## 2021-02-27 DIAGNOSIS — L538 Other specified erythematous conditions: Secondary | ICD-10-CM | POA: Diagnosis not present

## 2021-02-27 DIAGNOSIS — L570 Actinic keratosis: Secondary | ICD-10-CM | POA: Diagnosis not present

## 2021-02-27 DIAGNOSIS — L820 Inflamed seborrheic keratosis: Secondary | ICD-10-CM | POA: Diagnosis not present

## 2021-02-27 DIAGNOSIS — D225 Melanocytic nevi of trunk: Secondary | ICD-10-CM | POA: Diagnosis not present

## 2021-02-27 DIAGNOSIS — D485 Neoplasm of uncertain behavior of skin: Secondary | ICD-10-CM | POA: Diagnosis not present

## 2021-03-21 ENCOUNTER — Telehealth (INDEPENDENT_AMBULATORY_CARE_PROVIDER_SITE_OTHER): Payer: BC Managed Care – PPO | Admitting: Family Medicine

## 2021-03-21 DIAGNOSIS — R0981 Nasal congestion: Secondary | ICD-10-CM

## 2021-03-21 DIAGNOSIS — R059 Cough, unspecified: Secondary | ICD-10-CM

## 2021-03-21 MED ORDER — DOXYCYCLINE HYCLATE 100 MG PO TABS: 100.0000 mg | ORAL_TABLET | Freq: Two times a day (BID) | ORAL | 0 refills | Status: DC

## 2021-03-21 MED ORDER — BENZONATATE 100 MG PO CAPS: ORAL_CAPSULE | ORAL | 0 refills | Status: DC

## 2021-03-21 NOTE — Progress Notes (Signed)
Virtual Visit via Video Note  I connected with [name]  on 03/21/21 at  1:00 PM EST by a video enabled telemedicine application and verified that I am speaking with the correct person using two identifiers.  Location patient: home, [place] Location provider:work or home office Persons participating in the virtual visit: patient, provider  I discussed the limitations of evaluation and management by telemedicine and the availability of in person appointments. The patient expressed understanding and agreed to proceed.   HPI:  Acute telemedicine visit for "bronchitis": -Onset: 4 weeks ago with sinus congestion but worse the last 3 days -Symptoms include: nasal congestion, cough, fever, some diarrhea -covid test negative at home -all her grandkids have had sinus infections -Denies:SOB, CP, vomiting, inability to eat/drink/get out of bed -Has tried: albuterol at times for bad cough -Pertinent past medical history:see below -Pertinent medication allergies: No Known Allergies -COVID-19 vaccine status:  Immunization History  Administered Date(s) Administered   Influenza Inj Mdck Quad Pf 02/28/2020   Influenza Split 04/09/2011   Influenza Whole 02/02/2009   Influenza,inj,Quad PF,6+ Mos 01/26/2014, 02/06/2017, 03/04/2018, 01/26/2019, 01/14/2021   Influenza-Unspecified 02/02/2013, 02/24/2015, 04/03/2016   Moderna Sars-Covid-2 Vaccination 07/19/2019, 08/16/2019   Pneumococcal Polysaccharide-23 01/26/2019   Zoster Recombinat (Shingrix) 03/04/2018, 01/14/2021    ROS: See pertinent positives and negatives per HPI.  Past Medical History:  Diagnosis Date   Acute bursitis of right shoulder 09/16/2017   Injected in November 13, 2017   Acute lateral meniscal tear 09/09/2017   Arthritis    Avulsion fracture of lateral malleolus of right fibula 07/20/2018   Basal cell carcinoma    COPD (chronic obstructive pulmonary disease) (HCC)    DVT (deep venous thrombosis) ([place])    Essential hypertension 07/27/2020    History of acute bronchitis 03/24/2011   Kidney stones    Lymphadenopathy, abdominal 03/2013   Noted on abd/pelv CT done to eval slow-to-resolve pyelo   Meniere disorder 01/23/2012   Osteoporosis 12/2014   Dr. Nori Riis, her GYN, dx'd her   Pneumonia    Pyelonephritis 10/28/2017   Recurrent pyelonephritis    Seasonal allergic rhinitis    Squamous cell carcinoma in situ (SCCIS) of skin of lower leg 05/04/2018   Dr. Janan Ridge, [facility]   Squamous cell carcinoma in situ (SCCIS) of skin of lower leg 05/04/2018   Dr. Janan Ridge, [facility]  Invasive Squamous cell carcinoma margins of excision appear free of tumor in the sections examined.  Left Lateral Leg: Superficially invasive squamous cell carcinoma, extending to the deep margin.     Past Surgical History:  Procedure Laterality Date   DEXA  12/2014   T score spine -2.4, hip -3.5 (Dr. Nori Riis, [facility].   DILATION AND CURETTAGE OF UTERUS  2009   SKIN LESION EXCISION Left 05/24/2018   SCC; Skin, Excision, Left [place] leg: postsurgical scar, no neoplasm is identified.  Skin, Shave bx and ED&C, right anterior thigh-Superficially invasive squamous cell carcinoma, extending to the deep margin.     Current Outpatient Medications:    benzonatate (TESSALON PERLES) 100 MG capsule, 1-2 capsule up to twice daily as needed for cough, Disp: 30 capsule, Rfl: 0   doxycycline (VIBRA-TABS) 100 MG tablet, Take 1 tablet (100 mg total) by mouth 2 (two) times daily., Disp: 14 tablet, Rfl: 0   albuterol (PROVENTIL) (2.5 MG/3ML) 0.083% nebulizer solution, Take 3 mLs (2.5 mg total) by nebulization every 6 (six) hours as needed for wheezing or shortness of breath., Disp:  75 mL, Rfl: 1   albuterol (VENTOLIN HFA) 108 (90 Base) MCG/ACT inhaler, INHALE 1-2 PUFFS INTO THE LUNGS EVERY 4 (FOUR) HOURS AS NEEDED FOR WHEEZING OR SHORTNESS OF BREATH., Disp: 6.7 each, Rfl: 5   alendronate (FOSAMAX) 70 MG tablet, TAKE 1 TABLET  BY MOUTH EVERY 7 (SEVEN) DAYS. TAKE WITH A FULL GLASS OF WATER ON AN EMPTY STOMACH., Disp: 4 tablet, Rfl: 11   azelastine (ASTELIN) 0.1 % nasal spray, Place 1 spray into both nostrils 2 (two) times daily. Use in each nostril as directed, Disp: 30 mL, Rfl: 11   clobetasol ointment (TEMOVATE) 0.05 %, Apply topically at bedtime as needed., Disp: , Rfl:    DUOBRII 0.01-0.045 % LOTN, Apply 1 a small amount to affected area once a day  to plantar feet until smooth x 8 weeks, Disp: , Rfl:    estradiol (ESTRACE) 0.1 MG/GM vaginal cream, PLEASE SEE ATTACHED FOR DETAILED DIRECTIONS, Disp: , Rfl:    Fluticasone-Umeclidin-Vilant (TRELEGY ELLIPTA) 100-62.5-25 MCG/INH AEPB, INHALE 1 PUFF BY MOUTH EVERY DAY, Disp: 60 each, Rfl: 5   gabapentin (NEURONTIN) 100 MG capsule, TAKE 2 CAPSULES (200 MG TOTAL) BY MOUTH AT BEDTIME. (Patient not taking: Reported on 01/14/2021), Disp: , Rfl:    ipratropium (ATROVENT) 0.06 % nasal spray, Place 2 sprays into both nostrils 4 (four) times daily., Disp: 15 mL, Rfl: 12   meclizine (ANTIVERT) 25 MG tablet, Take 1 tablet (25 mg total) by mouth 2 (two) times daily as needed for dizziness., Disp: 30 tablet, Rfl: 5   montelukast (SINGULAIR) 10 MG tablet, TAKE 1 TABLET BY MOUTH EVERYDAY AT BEDTIME, Disp: 90 tablet, Rfl: 1   Multiple Vitamin (MULTIVITAMIN) tablet, Take 1 tablet by mouth daily., Disp: , Rfl:    Respiratory Therapy Supplies (FLUTTER) DEVI, Twice a day and prn as needed, may increase if feeling worse, Disp: 1 each, Rfl: 0   Spacer/Aero-Holding Chambers (AEROCHAMBER MV) inhaler, Use as instructed, Disp: 1 each, Rfl: 2   tretinoin (RETIN-A) 0.05 % cream, APPLY TO AFFECTED AREA EVERY DAY AT BEDTIME **PA SENT**, Disp: , Rfl:    triamcinolone ointment (KENALOG) 0.1 %, , Disp: , Rfl:    valsartan (DIOVAN) 80 MG tablet, Take 1.5 tablets (120 mg total) by mouth daily., Disp: 135 tablet, Rfl: 1   VITAMIN D, CHOLECALCIFEROL, PO, Take 5,000 Units by mouth daily. , Disp: , Rfl:    EXAM:  VITALS per patient if applicable:  GENERAL: alert, oriented, appears well and in no acute distress  HEENT: atraumatic, conjunttiva clear, no obvious abnormalities on inspection of external nose and ears  NECK: normal movements of the head and neck  LUNGS: on inspection no signs of respiratory distress, breathing rate appears normal, no obvious gross SOB, gasping or wheezing  CV: no obvious cyanosis  MS: moves all visible extremities without noticeable abnormality  PSYCH/NEURO: pleasant and cooperative, no obvious depression or anxiety, speech and thought processing grossly intact  ASSESSMENT AND PLAN:  Discussed the following assessment and plan:  Nasal congestion  Cough, unspecified type  -we discussed possible serious and likely etiologies, options for evaluation and workup, limitations of telemedicine visit vs in person visit, treatment, treatment risks and precautions. Pt is agreeable to treatment via telemedicine at this moment. Query developing sinusitis/bronchitis vs new acute viral resp illness vs other. Covid testing neg per pt report. She feels she needs an abx. Discussed alt dx, risks vs benefit. She opted for empiric doxy. Also sent tessalon for cough and can use her alb  q 6 hours.  Work/School slipped offered: declined Advised to seek prompt in person care if worsening, new symptoms arise, or if is not improving with treatment. Discussed options for inperson care if PCP office not available. Did let this patient know that I only do telemedicine on Tuesdays and Thursdays for [place]. Advised to schedule follow up visit with PCP or [facility] if any further questions or concerns to avoid delays in care.   I discussed the assessment and treatment plan with the patient. The patient was provided an opportunity to ask questions and all were answered. The patient agreed with the plan and demonstrated an understanding of the instructions.     Lucretia Kern, DO

## 2021-03-21 NOTE — Patient Instructions (Signed)
-  I sent the medication(s) we discussed to your pharmacy: Meds ordered this encounter  Medications   benzonatate (TESSALON PERLES) 100 MG capsule    Sig: 1-2 capsule up to twice daily as needed for cough    Dispense:  30 capsule    Refill:  0   doxycycline (VIBRA-TABS) 100 MG tablet    Sig: Take 1 tablet (100 mg total) by mouth 2 (two) times daily.    Dispense:  14 tablet    Refill:  0   Albuterol 2 puffs every 6 hours as needed.   I hope you are feeling better soon!  Seek in person care promptly if your symptoms worsen, new concerns arise or you are not improving with treatment.  It was nice to meet you today. I help [facility] out with telemedicine visits on Tuesdays and Thursdays and am available for visits on those days. If you have any concerns or questions following this visit please schedule a follow up visit with your Primary Care doctor or seek care at a local urgent care clinic to avoid delays in care.

## 2021-04-18 ENCOUNTER — Encounter: Payer: Self-pay | Admitting: Family Medicine

## 2021-04-18 ENCOUNTER — Telehealth (INDEPENDENT_AMBULATORY_CARE_PROVIDER_SITE_OTHER): Payer: Self-pay | Admitting: Family Medicine

## 2021-04-18 VITALS — Temp 100.8°F | Wt 110.0 lb

## 2021-04-18 DIAGNOSIS — R059 Cough, unspecified: Secondary | ICD-10-CM

## 2021-04-18 DIAGNOSIS — R509 Fever, unspecified: Secondary | ICD-10-CM

## 2021-04-18 MED ORDER — PROMETHAZINE-DM 6.25-15 MG/5ML PO SYRP: 5.0000 mL | ORAL_SOLUTION | Freq: Four times a day (QID) | ORAL | 0 refills | Status: DC | PRN

## 2021-04-18 MED ORDER — OSELTAMIVIR PHOSPHATE 75 MG PO CAPS: 75.0000 mg | ORAL_CAPSULE | Freq: Two times a day (BID) | ORAL | 0 refills | Status: DC

## 2021-04-18 MED ORDER — DOXYCYCLINE HYCLATE 100 MG PO TABS: 100.0000 mg | ORAL_TABLET | Freq: Two times a day (BID) | ORAL | 0 refills | Status: DC

## 2021-04-18 NOTE — Patient Instructions (Signed)
°  HOME CARE TIPS:  -COVID19 testing information: [email or web address].[email or web address]  Most pharmacies also offer testing and home test kits. If the Covid19 test is positive and you desire antiviral treatment, please contact a [facility] or schedule a follow up virtual visit through your primary care office or through the [employer] [facility].  Other test to treat options: ConnectRV.is?[email or web address]  -I sent the medication(s) we discussed to your pharmacy: Meds ordered this encounter  Medications   promethazine-dextromethorphan (PROMETHAZINE-DM) 6.25-15 MG/5ML syrup    Sig: Take 5 mLs by mouth 4 (four) times daily as needed for cough.    Dispense:  118 mL    Refill:  0   doxycycline (VIBRA-TABS) 100 MG tablet    Sig: Take 1 tablet (100 mg total) by mouth 2 (two) times daily.    Dispense:  14 tablet    Refill:  0   oseltamivir (TAMIFLU) 75 MG capsule    Sig: Take 1 capsule (75 mg total) by mouth 2 (two) times daily.    Dispense:  10 capsule    Refill:  0    Stop the tamiflu and doxycycline if the covid test is positive.   -can use tylenolif needed for fevers, aches and pains per instructions  -can use nasal saline a few times per day if you have nasal congestion  -stay hydrated, drink plenty of fluids and eat small healthy meals - avoid dairy  -can take 1000 IU (81mcg) Vit D3 and 100-500 mg of Vit C daily per instructions  -follow up with your doctor in 2-3 days unless improving and feeling better  -stay home while sick, except to seek medical care. If you have COVID19, you will likely be contagious for 7-10 days. Flu or Influenza is likely contagious for about 7 days. Other respiratory viral infections remain contagious for 5-10+ days depending on the virus and many other factors. Wear a good mask that fits snugly (such as N95 or KN95) if around others to reduce the risk of transmission.  It was nice to meet you today, and I really hope  you are feeling better soon. I help [place] out with telemedicine visits on Tuesdays and Thursdays and am happy to help if you need a follow up virtual visit on those days. Otherwise, if you have any concerns or questions following this visit please schedule a follow up visit with your Primary Care doctor or seek care at a local urgent care clinic to avoid delays in care.    Seek in person care or schedule a follow up video visit promptly if your symptoms worsen, new concerns arise or you are not improving with treatment. Call 911 and/or seek emergency care if your symptoms are severe or life threatening.

## 2021-04-18 NOTE — Progress Notes (Signed)
Virtual Visit via Telephone Note  I connected with [name] on 04/18/21 at  4:40 PM EST by telephone and verified that I am speaking with the correct person using two identifiers.   I discussed the limitations, risks, security and privacy concerns of performing an evaluation and management service by telephone and the availability of in person appointments. I also discussed with the patient that there may be a patient responsible charge related to this service. The patient expressed understanding and agreed to proceed.  Location patient: home, [place] Location provider: work or home office Participants present for the call: patient, provider Patient did not have a visit with me in the prior 7 days to address this/these issue(s).   History of Present Illness:  Acute telemedicine visit for "pneumonia": -Onset: yesterday -Symptoms include: nasal congestion, cough, body aches and fever, coughing up green mucus and has some pain when coughs -reports she has had pneumonia before and this feels like pneumonia - is requesting abx -also wants cough medication but does not want tessalon as does not work for her -Denies: SOB, NVD, inability to eat/drink/get out of bed -had a negative covid test -Pertinent past medical history: see below -Pertinent medication allergies:No Known Allergies -COVID-19 vaccine status:  Immunization History  Administered Date(s) Administered   Influenza Inj Mdck Quad Pf 02/28/2020   Influenza Split 04/09/2011   Influenza Whole 02/02/2009   Influenza,inj,Quad PF,6+ Mos 01/26/2014, 02/06/2017, 03/04/2018, 01/26/2019, 01/14/2021   Influenza-Unspecified 02/02/2013, 02/24/2015, 04/03/2016   Moderna Sars-Covid-2 Vaccination 07/19/2019, 08/16/2019   Pneumococcal Polysaccharide-23 01/26/2019   Zoster Recombinat (Shingrix) 03/04/2018, 01/14/2021      Observations/Objective: Patient sounds cheerful and well on the phone. I do not appreciate any SOB. Speech and thought  processing are grossly intact. Patient reported vitals:  Assessment and Plan:  Cough, unspecified type  Fever, unspecified fever cause  -we discussed possible serious and likely etiologies, options for evaluation and workup, limitations of telemedicine visit vs in person visit, treatment, treatment risks and precautions. Pt prefers to treat via telemedicine empirically rather than in person at this moment. Query viral or bacterial resp infection vs other. Suspect flu or covid very possible given community transmission levels currently. She wants to start empiric tamiflu, doxy and cough medication and agrees to retest for covid - instructions If positive in pt instructions. Alb prn.  Advise to seek prompt vv follow up or in person care if worsening, new symptoms arise, or if is not improving with treatment. Advised of options for inperson care in case PCP office not available. Did let the patient know that I only do telemedicine shifts for [place] on Tuesdays and Thursdays and advised a follow up visit with PCP or at an [facility] if has further questions or concerns.   Follow Up Instructions:  I did not refer this patient for an OV with me in the next 24 hours for this/these issue(s).  I discussed the assessment and treatment plan with the patient. The patient was provided an opportunity to ask questions and all were answered. The patient agreed with the plan and demonstrated an understanding of the instructions.   I spent 20 minutes on the date of this visit in the care of this patient. See summary of tasks completed to properly care for this patient in the detailed notes above which also included counseling of above, review of PMH, medications, allergies, evaluation of the patient and ordering and/or  instructing patient on testing and care options.     Nickola Major  Maudie Mercury, DO

## 2021-04-30 ENCOUNTER — Other Ambulatory Visit: Payer: Self-pay

## 2021-05-06 DIAGNOSIS — D0461 Carcinoma in situ of skin of right upper limb, including shoulder: Secondary | ICD-10-CM | POA: Diagnosis not present

## 2021-05-24 ENCOUNTER — Other Ambulatory Visit: Payer: Self-pay | Admitting: Primary Care

## 2021-06-11 ENCOUNTER — Ambulatory Visit: Payer: Self-pay | Admitting: Family Medicine

## 2021-06-11 ENCOUNTER — Other Ambulatory Visit: Payer: Self-pay

## 2021-06-11 DIAGNOSIS — S61206A Unspecified open wound of right little finger without damage to nail, initial encounter: Secondary | ICD-10-CM | POA: Diagnosis not present

## 2021-06-11 NOTE — Progress Notes (Signed)
[name] Phone: [phone] Subjective:   [name], am serving as a scribe for Dr. Hulan Saas.This visit occurred during the SARS-CoV-2 public health emergency.  Safety protocols were in place, including screening questions prior to the visit, additional usage of staff PPE, and extensive cleaning of exam room while observing appropriate contact time as indicated for disinfecting solutions.  I'm seeing this patient by the request  of:  Kuneff, Renee A, DO  CC: shoulder pain   [id number]  [name] Kimberley is a 66 y.o. female coming in with complaint of L scapular pain. Seen in 2020 for LLQ pain. Patient states that her pain began 4 weeks ago when taking care of her mom. Pain is burning and tight. Pain will radiate up and down spine in certain positions. Using Advil prn.        Past Medical History:  Diagnosis Date   Acute bursitis of right shoulder 09/16/2017   Injected in November 13, 2017   Acute lateral meniscal tear 09/09/2017   Arthritis    Avulsion fracture of lateral malleolus of right fibula 07/20/2018   Basal cell carcinoma    COPD (chronic obstructive pulmonary disease) (HCC)    DVT (deep venous thrombosis) ([place])    Essential hypertension 07/27/2020   History of acute bronchitis 03/24/2011   Kidney stones    Lymphadenopathy, abdominal 03/2013   Noted on abd/pelv CT done to eval slow-to-resolve pyelo   Meniere disorder 01/23/2012   Osteoporosis 12/2014   Dr. Nori Riis, her GYN, dx'd her   Pneumonia    Pyelonephritis 10/28/2017   Recurrent pyelonephritis    Seasonal allergic rhinitis    Squamous cell carcinoma in situ (SCCIS) of skin of lower leg 05/04/2018   Dr. Janan Ridge, [facility]   Squamous cell carcinoma in situ (SCCIS) of skin of lower leg 05/04/2018   Dr. Janan Ridge, [facility]  Invasive Squamous cell carcinoma margins of excision appear free of tumor in the  sections examined.  Left Lateral Leg: Superficially invasive squamous cell carcinoma, extending to the deep margin.    Past Surgical History:  Procedure Laterality Date   DEXA  12/2014   T score spine -2.4, hip -3.5 (Dr. Nori Riis, [facility].   DILATION AND CURETTAGE OF UTERUS  2009   SKIN LESION EXCISION Left 05/24/2018   SCC; Skin, Excision, Left [place] leg: postsurgical scar, [name] neoplasm is identified.  Skin, Shave bx and ED&C, right anterior thigh-Superficially invasive squamous cell carcinoma, extending to the deep margin.   Social History   Socioeconomic History   Marital status: Married    Spouse name: Not on file   Number of children: 3   Years of education: Not on file   Highest education level: Not on file  Occupational History   Occupation: housewife  Tobacco Use   Smoking status: Every Day    Packs/day: 1.00    Years: 40.00    Pack years: 40.00    Types: Cigarettes    Start date: 06/01/1978   Smokeless tobacco: Never   Tobacco comments:    still smoking a pack a day as of 09/18/2020  Vaping Use   Vaping Use: Never used  Substance and Sexual Activity   Alcohol use: Yes    Alcohol/week: 7.0 standard drinks    Types: 7 Glasses of wine per week    Comment: 1 per day   Drug use: [name]  Sexual activity: Yes    Partners: Male  Other Topics Concern   Not on file  Social History Narrative   Not on file   Social Determinants of Health   Financial Resource Strain: Not on file  Food Insecurity: Not on file  Transportation Needs: Not on file  Physical Activity: Not on file  Stress: Not on file  Social Connections: Not on file   [name] Known Allergies Family History  Problem Relation Age of Onset   Hypertension Mother    Glaucoma Mother    Irritable bowel syndrome Mother    Arthritis Mother    Kidney disease Mother    Diabetes Mother    Hypertension Father    Heart disease Father        bypasses   Cancer Father        sarcoma   Heart disease  Maternal Grandmother        CHF   Stroke Maternal Grandmother        in his 59's   Breast cancer Maternal Grandmother    Stroke Maternal Grandfather    Other Paternal Grandmother        hardening of the arteries   Heart attack Paternal Grandfather    Colon cancer Paternal Aunt 70   Stomach cancer Neg Hx     Current Outpatient Medications (Endocrine & Metabolic):    alendronate (FOSAMAX) 70 MG tablet, TAKE 1 TABLET BY MOUTH EVERY 7 (SEVEN) DAYS. TAKE WITH A FULL GLASS OF WATER ON AN EMPTY STOMACH.  Current Outpatient Medications (Cardiovascular):    valsartan (DIOVAN) 80 MG tablet, Take 1.5 tablets (120 mg total) by mouth daily.  Current Outpatient Medications (Respiratory):    albuterol (PROVENTIL) (2.5 MG/3ML) 0.083% nebulizer solution, Take 3 mLs (2.5 mg total) by nebulization every 6 (six) hours as needed for wheezing or shortness of breath.   albuterol (VENTOLIN HFA) 108 (90 Base) MCG/ACT inhaler, INHALE 1-2 PUFFS INTO THE LUNGS EVERY 4 (FOUR) HOURS AS NEEDED FOR WHEEZING OR SHORTNESS OF BREATH.   azelastine (ASTELIN) 0.1 % nasal spray, Place 1 spray into both nostrils 2 (two) times daily. Use in each nostril as directed   ipratropium (ATROVENT) 0.06 % nasal spray, Place 2 sprays into both nostrils 4 (four) times daily.   montelukast (SINGULAIR) 10 MG tablet, TAKE 1 TABLET BY MOUTH EVERYDAY AT BEDTIME   promethazine-dextromethorphan (PROMETHAZINE-DM) 6.25-15 MG/5ML syrup, Take 5 mLs by mouth 4 (four) times daily as needed for cough.   TRELEGY ELLIPTA 100-62.5-25 MCG/ACT AEPB, INHALE 1 PUFF BY MOUTH EVERY DAY  Current Outpatient Medications (Analgesics):    traMADol (ULTRAM) 50 MG tablet, Take 1 tablet (50 mg total) by mouth every 12 (twelve) hours as needed for up to 5 days.   Current Outpatient Medications (Other):    doxycycline (VIBRA-TABS) 100 MG tablet, Take 1 tablet (100 mg total) by mouth 2 (two) times daily.   meclizine (ANTIVERT) 25 MG tablet, Take 1 tablet (25 mg  total) by mouth 2 (two) times daily as needed for dizziness.   Multiple Vitamin (MULTIVITAMIN) tablet, Take 1 tablet by mouth daily.   oseltamivir (TAMIFLU) 75 MG capsule, Take 1 capsule (75 mg total) by mouth 2 (two) times daily.   Respiratory Therapy Supplies (FLUTTER) DEVI, Twice a day and prn as needed, may increase if feeling worse   Spacer/Aero-Holding Chambers (AEROCHAMBER MV) inhaler, Use as instructed   tiZANidine (ZANAFLEX) 2 MG tablet, Take 1 tablet (2 mg total) by mouth at bedtime.   tretinoin (  RETIN-A) 0.05 % cream, APPLY TO AFFECTED AREA EVERY DAY AT BEDTIME **PA SENT**   VITAMIN D, CHOLECALCIFEROL, PO, Take 5,000 Units by mouth daily.     Review of Systems:  [name] headache, visual changes, nausea, vomiting, diarrhea, constipation, dizziness, abdominal pain, skin rash, fevers, chills, night sweats, weight loss, swollen lymph nodes, body aches, joint swelling, chest pain, shortness of breath, mood changes. POSITIVE muscle aches  Objective  Blood pressure (!) 140/100, pulse 86, height 5\' 4"  (1.626 m), weight 108 lb (49 kg), SpO2 92 %.   General: [name] apparent distress alert and oriented x3 mood and affect normal, dressed appropriately.  Patient is tearful when talking about her mother. HEENT: Pupils equal, extraocular movements intact  Respiratory: Patient's speak in full sentences and does not appear short of breath  Cardiovascular: [name] lower extremity edema, non tender, [name] erythema  Gait normal with good balance and coordination.  MSK:  shoulder exam shows significant trigger points noted in the parascapular region on the left side.  Seems to be having 1 on the right side as well.  Significant tightness noted there as well as in the neck itself.  Attempted osteopathic manipulation but did have audible pop immediately on the right side even with light pressure.  [name] pain but was concerned with potential fractures.  So discontinued.  Patient was able to move appropriately, [name] pain at the  end of the visit.  Unable to get up and left back with [name] significant discomfort.  [name] swelling anteriorly or posteriorly.  After verbal consent patient was prepped with alcohol swab and with a 25-gauge half inch needle injected in the latissimus dorsi, rhomboid, levator scapula on the left side and the lever scapula on the right side with a total of 4 cc 0.5% Marcaine and 1 cc of Kenalog 40 mg/mL.  Minimal blood loss.  Band-Aids placed.  Postinjection instructions given.    Impression and Recommendations:     The above documentation has been reviewed and is accurate and complete Lyndal Pulley, DO

## 2021-06-12 ENCOUNTER — Encounter: Payer: Self-pay | Admitting: Family Medicine

## 2021-06-12 ENCOUNTER — Other Ambulatory Visit: Payer: Self-pay

## 2021-06-12 ENCOUNTER — Ambulatory Visit (INDEPENDENT_AMBULATORY_CARE_PROVIDER_SITE_OTHER): Payer: Medicare Other | Admitting: Family Medicine

## 2021-06-12 ENCOUNTER — Ambulatory Visit: Payer: Medicare Other | Admitting: Family Medicine

## 2021-06-12 VITALS — BP 130/70 | HR 85 | Temp 99.0°F | Ht 64.0 in | Wt 108.0 lb

## 2021-06-12 DIAGNOSIS — J440 Chronic obstructive pulmonary disease with acute lower respiratory infection: Secondary | ICD-10-CM | POA: Diagnosis not present

## 2021-06-12 DIAGNOSIS — J441 Chronic obstructive pulmonary disease with (acute) exacerbation: Secondary | ICD-10-CM | POA: Diagnosis not present

## 2021-06-12 DIAGNOSIS — J209 Acute bronchitis, unspecified: Secondary | ICD-10-CM | POA: Diagnosis not present

## 2021-06-12 DIAGNOSIS — M25512 Pain in left shoulder: Secondary | ICD-10-CM

## 2021-06-12 HISTORY — DX: Pain in left shoulder: M25.512

## 2021-06-12 MED ORDER — TIZANIDINE HCL 2 MG PO TABS: 2.0000 mg | ORAL_TABLET | Freq: Every day | ORAL | 0 refills | Status: DC

## 2021-06-12 MED ORDER — PREDNISONE 20 MG PO TABS: ORAL_TABLET | ORAL | 0 refills | Status: DC

## 2021-06-12 MED ORDER — LEVOFLOXACIN 500 MG PO TABS: 500.0000 mg | ORAL_TABLET | Freq: Every day | ORAL | 0 refills | Status: AC

## 2021-06-12 MED ORDER — TRAMADOL HCL 50 MG PO TABS: 50.0000 mg | ORAL_TABLET | Freq: Two times a day (BID) | ORAL | 0 refills | Status: AC | PRN

## 2021-06-12 NOTE — Patient Instructions (Signed)
Trigger point injections Do prescribed exercises at least 3x a week Zanaflex at night See you again in 3-4 weeks

## 2021-06-12 NOTE — Assessment & Plan Note (Signed)
Patient has had mod patient erate discomfort in the left shoulder region.  Given trigger point injections today.  Tolerated that procedure well.  Attempted the possibility of manipulation but did have an audible pop in the main 1 concern for potential rib fracture.  Patient did not have any pain.  No significant discomfort with any deep inspiration.  Did give patient tramadol though in case this did not seem to worsen.  Patient knows if any shortness of breath or chest pain to seek medical attention immediately.  Did have the recent passing of her mom and she was a primary caregiver which likely also contributes to some of patient's discomfort and pain.  Follow-up with me in 2 to 3 weeks.

## 2021-06-12 NOTE — Progress Notes (Signed)
This visit occurred during the SARS-CoV-2 public health emergency.  Safety protocols were in place, including screening questions prior to the visit, additional usage of staff PPE, and extensive cleaning of exam room while observing appropriate contact time as indicated for disinfecting solutions.    [name] , [birth year], 66 y.o., female MRN: [id number] Patient Care Team    Relationship Specialty Notifications Start End  Ma Hillock, DO PCP - General Family Medicine  04/23/15   Volanda Napoleon, MD Consulting Physician Oncology  05/12/13   Maisie Fus, MD Consulting Physician Obstetrics and Gynecology  01/01/15   Chesley Mires, MD Consulting Physician Pulmonary Disease  02/22/16   Carolan Clines, MD (Inactive) Consulting Physician Urology  04/21/16   Bartholomew Crews, PA-C  Dermatology  07/04/16    Comment: [facility], Olevia Bowens, DO Consulting Physician Sports Medicine  05/17/18   Plonk, Noni Saupe, PA-C  Physician Assistant  05/25/18     Chief Complaint  Patient presents with   Cough    Pt c/o cough and nasal and chest congestion x 1 mo;      Subjective: Pt presents for an OV with complaints of cough/pdx, nasal congestion, fatigue, wheezing of 4 weeks duration.  She is smoker. Her mother recently passed away from pneumonia and Wilda was her caregiver. She has frequent bronchitis/PNA.   Depression screen [facility] 2/9 06/12/2021 06/27/2020 11/21/2019 10/28/2017 08/10/2017  Decreased Interest 0 0 0 0 0  Down, Depressed, Hopeless 0 0 0 0 0  PHQ - 2 Score 0 0 0 0 0  Some recent data might be hidden    No Known Allergies Social History   Social History Narrative   Not on file   Past Medical History:  Diagnosis Date   Acute bursitis of right shoulder 09/16/2017   Injected in November 13, 2017   Acute lateral meniscal tear 09/09/2017   Arthritis    Avulsion fracture of lateral malleolus of right fibula 07/20/2018   Basal cell carcinoma    COPD  (chronic obstructive pulmonary disease) (HCC)    DVT (deep venous thrombosis) ([place])    Essential hypertension 07/27/2020   History of acute bronchitis 03/24/2011   Kidney stones    Lymphadenopathy, abdominal 03/2013   Noted on abd/pelv CT done to eval slow-to-resolve pyelo   Meniere disorder 01/23/2012   Osteoporosis 12/2014   Dr. Nori Riis, her GYN, dx'd her   Pneumonia    Pyelonephritis 10/28/2017   Recurrent pyelonephritis    Seasonal allergic rhinitis    Squamous cell carcinoma in situ (SCCIS) of skin of lower leg 05/04/2018   Dr. Janan Ridge, [facility]   Squamous cell carcinoma in situ (SCCIS) of skin of lower leg 05/04/2018   Dr. Janan Ridge, [facility]  Invasive Squamous cell carcinoma margins of excision appear free of tumor in the sections examined.  Left Lateral Leg: Superficially invasive squamous cell carcinoma, extending to the deep margin.    Past Surgical History:  Procedure Laterality Date   DEXA  12/2014   T score spine -2.4, hip -3.5 (Dr. [facility]i [facility].   DILATION AND CURETTAGE OF UTERUS  2009   SKIN LESION EXCISION Left 05/24/2018   SCC; Skin, Excision, Left Laeral leg: postsurgical scar, no neoplasm is identified.  Skin, Shave bx and ED&C, right anterior thigh-Superficially invasive squamous cell carcinoma, extending to the deep margin.   Family History  Problem Relation  Age of Onset   Hypertension Mother    Glaucoma Mother    Irritable bowel syndrome Mother    Arthritis Mother    Kidney disease Mother    Diabetes Mother    Hypertension Father    Heart disease Father        bypasses   Cancer Father        sarcoma   Colon cancer Paternal Aunt 24   Heart disease Maternal Grandmother        CHF   Stroke Maternal Grandmother        in his 35's   Breast cancer Maternal Grandmother    Stroke Maternal Grandfather    Other Paternal Grandmother        hardening of the arteries   Heart attack Paternal Grandfather     Stomach cancer Neg Hx    Allergies as of 06/12/2021   No Known Allergies      Medication List        Accurate as of June 12, 2021  3:05 PM. If you have any questions, ask your nurse or doctor.          AeroChamber MV inhaler Use as instructed   albuterol (2.5 MG/3ML) 0.083% nebulizer solution Commonly known as: PROVENTIL Take 3 mLs (2.5 mg total) by nebulization every 6 (six) hours as needed for wheezing or shortness of breath.   albuterol 108 (90 Base) MCG/ACT inhaler Commonly known as: VENTOLIN HFA INHALE 1-2 PUFFS INTO THE LUNGS EVERY 4 (FOUR) HOURS AS NEEDED FOR WHEEZING OR SHORTNESS OF BREATH.   alendronate 70 MG tablet Commonly known as: FOSAMAX TAKE 1 TABLET BY MOUTH EVERY 7 (SEVEN) DAYS. TAKE WITH A FULL GLASS OF WATER ON AN EMPTY STOMACH.   azelastine 0.1 % nasal spray Commonly known as: ASTELIN Place 1 spray into both nostrils 2 (two) times daily. Use in each nostril as directed   doxycycline 100 MG tablet Commonly known as: VIBRA-TABS Take 1 tablet (100 mg total) by mouth 2 (two) times daily.   Flutter Devi Twice a day and prn as needed, may increase if feeling worse   ipratropium 0.06 % nasal spray Commonly known as: ATROVENT Place 2 sprays into both nostrils 4 (four) times daily.   meclizine 25 MG tablet Commonly known as: ANTIVERT Take 1 tablet (25 mg total) by mouth 2 (two) times daily as needed for dizziness.   montelukast 10 MG tablet Commonly known as: SINGULAIR TAKE 1 TABLET BY MOUTH EVERYDAY AT BEDTIME   multivitamin tablet Take 1 tablet by mouth daily.   oseltamivir 75 MG capsule Commonly known as: Tamiflu Take 1 capsule (75 mg total) by mouth 2 (two) times daily.   promethazine-dextromethorphan 6.25-15 MG/5ML syrup Commonly known as: PROMETHAZINE-DM Take 5 mLs by mouth 4 (four) times daily as needed for cough.   tiZANidine 2 MG tablet Commonly known as: ZANAFLEX Take 1 tablet (2 mg total) by mouth at bedtime. Started by:  Lyndal Pulley, DO   traMADol 50 MG tablet Commonly known as: ULTRAM Take 1 tablet (50 mg total) by mouth every 12 (twelve) hours as needed for up to 5 days. Started by: Lyndal Pulley, DO   Trelegy Ellipta 100-62.5-25 MCG/ACT Aepb Generic drug: Fluticasone-Umeclidin-Vilant INHALE 1 PUFF BY MOUTH EVERY DAY   tretinoin 0.05 % cream Commonly known as: RETIN-A APPLY TO AFFECTED AREA EVERY DAY AT BEDTIME **PA SENT**   valsartan 80 MG tablet Commonly known as: DIOVAN Take 1.5 tablets (120 mg total) by mouth  daily.   VITAMIN D (CHOLECALCIFEROL) PO Take 5,000 Units by mouth daily.        All past medical history, surgical history, allergies, family history, immunizations andmedications were updated in the EMR today and reviewed under the history and medication portions of their EMR.     ROS Negative, with the exception of above mentioned in HPI   Objective:  BP 130/70    Pulse 85    Temp 99 F (37.2 C) (Oral)    Ht 5\' 4"  (1.626 m)    Wt 108 lb (49 kg)    SpO2 94%    BMI 18.54 kg/m  Body mass index is 18.54 kg/m.  Physical Exam Vitals and nursing note reviewed.  Constitutional:      General: She is not in acute distress.    Appearance: Normal appearance. She is not ill-appearing, toxic-appearing or diaphoretic.  HENT:     Head: Normocephalic and atraumatic.  Eyes:     General: No scleral icterus.       Right eye: No discharge.        Left eye: No discharge.     Extraocular Movements: Extraocular movements intact.     Conjunctiva/sclera: Conjunctivae normal.     Pupils: Pupils are equal, round, and reactive to light.  Cardiovascular:     Rate and Rhythm: Normal rate and regular rhythm.     Heart sounds: No murmur heard.   No gallop.  Pulmonary:     Effort: Pulmonary effort is normal. No respiratory distress.     Breath sounds: Wheezing and rhonchi present. No rales.  Musculoskeletal:     Cervical back: Neck supple. No tenderness.  Lymphadenopathy:     Cervical:  No cervical adenopathy.  Skin:    General: Skin is warm and dry.     Coloration: Skin is not jaundiced or pale.     Findings: No erythema or rash.  Neurological:     Mental Status: She is alert and oriented to person, place, and time. Mental status is at baseline.     Motor: No weakness.     Gait: Gait normal.  Psychiatric:        Mood and Affect: Mood normal.        Behavior: Behavior normal.        Thought Content: Thought content normal.        Judgment: Judgment normal.    No results found. No results found. No results found for this or any previous visit (from the past 24 hour(s)).  Assessment/Plan: BRENTLEY HORRELL is a 66 y.o. female present for OV for  COPD exacerbation (HCC)/Acute bronchitis with COPD ([place]) [place], hydrate.  mucinex (DM if cough), nettie pot or nasal saline.  LQ and prednisone (steroid taper start tomorrow) prescribed, take until completed.  If cough present it can last up to 6-8 weeks.  F/U 2 weeks if not improved- sooner if worsening.     Reviewed expectations re: course of current medical issues. Discussed self-management of symptoms. Outlined signs and symptoms indicating need for more acute intervention. Patient verbalized understanding and all questions were answered. Patient received an After-Visit Summary.    No orders of the defined types were placed in this encounter.  No orders of the defined types were placed in this encounter.  Referral Orders  No referral(s) requested today     Note is dictated utilizing voice recognition software. Although note has been proof read prior to signing, occasional typographical errors still can be  missed. If any questions arise, please do not hesitate to call for verification.   electronically signed by:  Howard Pouch, DO  [place]

## 2021-06-21 ENCOUNTER — Encounter: Payer: Self-pay | Admitting: Family Medicine

## 2021-06-21 ENCOUNTER — Ambulatory Visit (INDEPENDENT_AMBULATORY_CARE_PROVIDER_SITE_OTHER): Payer: Medicare Other | Admitting: Family Medicine

## 2021-06-21 ENCOUNTER — Other Ambulatory Visit: Payer: Self-pay

## 2021-06-21 VITALS — BP 148/86 | HR 105 | Temp 98.9°F | Ht 64.0 in | Wt 109.0 lb

## 2021-06-21 DIAGNOSIS — J209 Acute bronchitis, unspecified: Secondary | ICD-10-CM | POA: Diagnosis not present

## 2021-06-21 DIAGNOSIS — L905 Scar conditions and fibrosis of skin: Secondary | ICD-10-CM | POA: Diagnosis not present

## 2021-06-21 DIAGNOSIS — J440 Chronic obstructive pulmonary disease with acute lower respiratory infection: Secondary | ICD-10-CM | POA: Diagnosis not present

## 2021-06-21 MED ORDER — IPRATROPIUM-ALBUTEROL 0.5-2.5 (3) MG/3ML IN SOLN
3.0000 mL | Freq: Once | RESPIRATORY_TRACT | Status: AC
  Administered 2021-06-21: 3 mL via RESPIRATORY_TRACT

## 2021-06-21 MED ORDER — FLUTICASONE PROPIONATE HFA 110 MCG/ACT IN AERO: 1.0000 | INHALATION_SPRAY | Freq: Every day | RESPIRATORY_TRACT | 1 refills | Status: DC

## 2021-06-21 NOTE — Patient Instructions (Signed)
Added extra fluticasone puff in the morning for 2 weeks.  Continue trellegy at night  Use albuterol every 6-8 hours - 2 puffs for next week.

## 2021-06-21 NOTE — Progress Notes (Unsigned)
[name] Phone: [phone] Subjective:    I'm seeing this patient by the request  of:  Kuneff, Renee A, DO  CC:   MPN:[id number]  06/12/2021 Patient has had mod patient erate discomfort in the left shoulder region.  Given trigger point injections today.  Tolerated that procedure well.  Attempted the possibility of manipulation but did have an audible pop in the main 1 concern for potential rib fracture.  Patient did not have any pain.  No significant discomfort with any deep inspiration.  Did give patient tramadol though in case this did not seem to worsen.  Patient knows if any shortness of breath or chest pain to seek medical attention immediately.  Did have the recent passing of her mom and she was a primary caregiver which likely also contributes to some of patient's discomfort and pain.  Follow-up with me in 2 to 3 weeks.  Updated 06/25/2021 [name] is a 66 y.o. female coming in with complaint of left shoulder pain       Past Medical History:  Diagnosis Date   Acute bursitis of right shoulder 09/16/2017   Injected in November 13, 2017   Acute lateral meniscal tear 09/09/2017   Arthritis    Avulsion fracture of lateral malleolus of right fibula 07/20/2018   Basal cell carcinoma    COPD (chronic obstructive pulmonary disease) (HCC)    DVT (deep venous thrombosis) ([place])    Essential hypertension 07/27/2020   History of acute bronchitis 03/24/2011   Kidney stones    Lymphadenopathy, abdominal 03/2013   Noted on abd/pelv CT done to eval slow-to-resolve pyelo   Meniere disorder 01/23/2012   Osteoporosis 12/2014   Dr. Nori Riis, her GYN, dx'd her   Pneumonia    Pyelonephritis 10/28/2017   Recurrent pyelonephritis    Seasonal allergic rhinitis    Squamous cell carcinoma in situ (SCCIS) of skin of lower leg 05/04/2018   Dr. Janan Ridge, [facility]   Squamous cell carcinoma in situ (SCCIS) of skin of  lower leg 05/04/2018   Dr. Janan Ridge, [facility]  Invasive Squamous cell carcinoma margins of excision appear free of tumor in the sections examined.  Left Lateral Leg: Superficially invasive squamous cell carcinoma, extending to the deep margin.    Past Surgical History:  Procedure Laterality Date   DEXA  12/2014   T score spine -2.4, hip -3.5 (Dr. Nori Riis, [facility].   DILATION AND CURETTAGE OF UTERUS  2009   SKIN LESION EXCISION Left 05/24/2018   SCC; Skin, Excision, Left Laeral leg: postsurgical scar, no neoplasm is identified.  Skin, Shave bx and ED&C, right anterior thigh-Superficially invasive squamous cell carcinoma, extending to the deep margin.   Social History   Socioeconomic History   Marital status: Married    Spouse name: Not on file   Number of children: 3   Years of education: Not on file   Highest education level: Not on file  Occupational History   Occupation: housewife  Tobacco Use   Smoking status: Every Day    Packs/day: 1.00    Years: 40.00    Pack years: 40.00    Types: Cigarettes    Start date: 06/01/1978   Smokeless tobacco: Never   Tobacco comments:    still smoking a pack a day as of 09/18/2020  Vaping Use   Vaping Use: Never used  Substance and Sexual Activity  Alcohol use: Yes    Alcohol/week: 7.0 standard drinks    Types: 7 Glasses of wine per week    Comment: 1 per day   Drug use: No   Sexual activity: Yes    Partners: Male  Other Topics Concern   Not on file  Social History Narrative   Not on file   Social Determinants of Health   Financial Resource Strain: Not on file  Food Insecurity: Not on file  Transportation Needs: Not on file  Physical Activity: Not on file  Stress: Not on file  Social Connections: Not on file   No Known Allergies Family History  Problem Relation Age of Onset   Hypertension Mother    Glaucoma Mother    Irritable bowel syndrome Mother    Arthritis Mother    Kidney  disease Mother    Diabetes Mother    Hypertension Father    Heart disease Father        bypasses   Cancer Father        sarcoma   Colon cancer Paternal Aunt 108   Heart disease Maternal Grandmother        CHF   Stroke Maternal Grandmother        in his 43's   Breast cancer Maternal Grandmother    Stroke Maternal Grandfather    Other Paternal Grandmother        hardening of the arteries   Heart attack Paternal Grandfather    Stomach cancer Neg Hx     Current Outpatient Medications (Endocrine & Metabolic):    alendronate (FOSAMAX) 70 MG tablet, TAKE 1 TABLET BY MOUTH EVERY 7 (SEVEN) DAYS. TAKE WITH A FULL GLASS OF WATER ON AN EMPTY STOMACH.   predniSONE (DELTASONE) 20 MG tablet, 60 mg x3d, 40 mg x3d, 20 mg x2d, 10 mg x2d  Current Outpatient Medications (Cardiovascular):    valsartan (DIOVAN) 80 MG tablet, Take 1.5 tablets (120 mg total) by mouth daily.  Current Outpatient Medications (Respiratory):    albuterol (PROVENTIL) (2.5 MG/3ML) 0.083% nebulizer solution, Take 3 mLs (2.5 mg total) by nebulization every 6 (six) hours as needed for wheezing or shortness of breath.   albuterol (VENTOLIN HFA) 108 (90 Base) MCG/ACT inhaler, INHALE 1-2 PUFFS INTO THE LUNGS EVERY 4 (FOUR) HOURS AS NEEDED FOR WHEEZING OR SHORTNESS OF BREATH.   azelastine (ASTELIN) 0.1 % nasal spray, Place 1 spray into both nostrils 2 (two) times daily. Use in each nostril as directed   ipratropium (ATROVENT) 0.06 % nasal spray, Place 2 sprays into both nostrils 4 (four) times daily.   montelukast (SINGULAIR) 10 MG tablet, TAKE 1 TABLET BY MOUTH EVERYDAY AT BEDTIME   TRELEGY ELLIPTA 100-62.5-25 MCG/ACT AEPB, INHALE 1 PUFF BY MOUTH EVERY DAY    Current Outpatient Medications (Other):    meclizine (ANTIVERT) 25 MG tablet, Take 1 tablet (25 mg total) by mouth 2 (two) times daily as needed for dizziness.   Multiple Vitamin (MULTIVITAMIN) tablet, Take 1 tablet by mouth daily.   Respiratory Therapy Supplies (FLUTTER)  DEVI, Twice a day and prn as needed, may increase if feeling worse   Spacer/Aero-Holding Chambers (AEROCHAMBER MV) inhaler, Use as instructed   tiZANidine (ZANAFLEX) 2 MG tablet, Take 1 tablet (2 mg total) by mouth at bedtime.   tretinoin (RETIN-A) 0.05 % cream, APPLY TO AFFECTED AREA EVERY DAY AT BEDTIME **PA SENT**   VITAMIN D, CHOLECALCIFEROL, PO, Take 5,000 Units by mouth daily.    Reviewed prior external information including notes and  imaging from  primary care provider As well as notes that were available from care everywhere and other healthcare systems.  Past medical history, social, surgical and family history all reviewed in electronic medical record.  No pertanent information unless stated regarding to the chief complaint.   Review of Systems:  No headache, visual changes, nausea, vomiting, diarrhea, constipation, dizziness, abdominal pain, skin rash, fevers, chills, night sweats, weight loss, swollen lymph nodes, body aches, joint swelling, chest pain, shortness of breath, mood changes. POSITIVE muscle aches  Objective  There were no vitals taken for this visit.   General: No apparent distress alert and oriented x3 mood and affect normal, dressed appropriately.  HEENT: Pupils equal, extraocular movements intact  Respiratory: Patient's speak in full sentences and does not appear short of breath  Cardiovascular: No lower extremity edema, non tender, no erythema  Gait normal with good balance and coordination.  MSK:  Non tender with full range of motion and good stability and symmetric strength and tone of shoulders, elbows, wrist, hip, knee and ankles bilaterally.     Impression and Recommendations:     The above documentation has been reviewed and is accurate and complete Delsa Sale

## 2021-06-21 NOTE — Progress Notes (Signed)
This visit occurred during the SARS-CoV-2 public health emergency.  Safety protocols were in place, including screening questions prior to the visit, additional usage of staff PPE, and extensive cleaning of exam room while observing appropriate contact time as indicated for disinfecting solutions.    [name] , 11-15-1955, 66 y.o., female MRN: [id number] Patient Care Team    Relationship Specialty Notifications Start End  Ma Hillock, DO PCP - General Family Medicine  04/23/15   Volanda Napoleon, MD Consulting Physician Oncology  05/12/13   Maisie Fus, MD Consulting Physician Obstetrics and Gynecology  01/01/15   Chesley Mires, MD Consulting Physician Pulmonary Disease  02/22/16   Carolan Clines, MD (Inactive) Consulting Physician Urology  04/21/16   Bartholomew Crews, PA-C  Dermatology  07/04/16    Comment: [facility], Olevia Bowens, DO Consulting Physician Sports Medicine  05/17/18   Plonk, Noni Saupe, PA-C  Physician Assistant  05/25/18     Chief Complaint  Patient presents with   Bronchitis     Subjective: Pt presents for an in second OV with complaints of cough/pdx, nasal congestion, fatigue, wheezing.  She was seen last week and prescribed 7 days of Levaquin daily and a prednisone taper.  She states she has started to feel some improvement but is still feeling tightness and wheezing.  She is compliant with her Trelegy.  She has not used her albuterol.  She is smoker. Her mother recently passed away from pneumonia and Lashonda was her caregiver. She has frequent bronchitis/PNA.   Depression screen [facility] 2/9 06/12/2021 06/27/2020 11/21/2019 10/28/2017 08/10/2017  Decreased Interest 0 0 0 0 0  Down, Depressed, Hopeless 0 0 0 0 0  PHQ - 2 Score 0 0 0 0 0  Some recent data might be hidden    No Known Allergies Social History   Social History Narrative   Not on file   Past Medical History:  Diagnosis Date   Acute bursitis of right shoulder  09/16/2017   Injected in November 13, 2017   Acute lateral meniscal tear 09/09/2017   Arthritis    Avulsion fracture of lateral malleolus of right fibula 07/20/2018   Basal cell carcinoma    COPD (chronic obstructive pulmonary disease) (HCC)    DVT (deep venous thrombosis) ([place])    Essential hypertension 07/27/2020   History of acute bronchitis 03/24/2011   Kidney stones    Lymphadenopathy, abdominal 03/2013   Noted on abd/pelv CT done to eval slow-to-resolve pyelo   Meniere disorder 01/23/2012   Osteoporosis 12/2014   Dr. Nori Riis, her GYN, dx'd her   Pneumonia    Pyelonephritis 10/28/2017   Recurrent pyelonephritis    Seasonal allergic rhinitis    Squamous cell carcinoma in situ (SCCIS) of skin of lower leg 05/04/2018   Dr. Janan Ridge, [facility]   Squamous cell carcinoma in situ (SCCIS) of skin of lower leg 05/04/2018   Dr. Janan Ridge, [facility]  Invasive Squamous cell carcinoma margins of excision appear free of tumor in the sections examined.  Left Lateral Leg: Superficially invasive squamous cell carcinoma, extending to the deep margin.    Past Surgical History:  Procedure Laterality Date   DEXA  12/2014   T score spine -2.4, hip -3.5 (Dr. Nori Riis, [facility].   DILATION AND CURETTAGE OF UTERUS  2009   SKIN LESION EXCISION Left 05/24/2018   SCC; Skin, Excision, Left Laeral leg:  postsurgical scar, no neoplasm is identified.  Skin, Shave bx and ED&C, right anterior thigh-Superficially invasive squamous cell carcinoma, extending to the deep margin.   Family History  Problem Relation Age of Onset   Hypertension Mother    Glaucoma Mother    Irritable bowel syndrome Mother    Arthritis Mother    Kidney disease Mother    Diabetes Mother    Hypertension Father    Heart disease Father        bypasses   Cancer Father        sarcoma   Colon cancer Paternal Aunt 92   Heart disease Maternal Grandmother        CHF   Stroke Maternal  Grandmother        in his 12's   Breast cancer Maternal Grandmother    Stroke Maternal Grandfather    Other Paternal Grandmother        hardening of the arteries   Heart attack Paternal Grandfather    Stomach cancer Neg Hx    Allergies as of 06/21/2021   No Known Allergies      Medication List        Accurate as of June 21, 2021  3:56 PM. If you have any questions, ask your nurse or doctor.          AeroChamber MV inhaler Use as instructed   albuterol (2.5 MG/3ML) 0.083% nebulizer solution Commonly known as: PROVENTIL Take 3 mLs (2.5 mg total) by nebulization every 6 (six) hours as needed for wheezing or shortness of breath.   albuterol 108 (90 Base) MCG/ACT inhaler Commonly known as: VENTOLIN HFA INHALE 1-2 PUFFS INTO THE LUNGS EVERY 4 (FOUR) HOURS AS NEEDED FOR WHEEZING OR SHORTNESS OF BREATH.   alendronate 70 MG tablet Commonly known as: FOSAMAX TAKE 1 TABLET BY MOUTH EVERY 7 (SEVEN) DAYS. TAKE WITH A FULL GLASS OF WATER ON AN EMPTY STOMACH.   azelastine 0.1 % nasal spray Commonly known as: ASTELIN Place 1 spray into both nostrils 2 (two) times daily. Use in each nostril as directed   Flutter Devi Twice a day and prn as needed, may increase if feeling worse   ipratropium 0.06 % nasal spray Commonly known as: ATROVENT Place 2 sprays into both nostrils 4 (four) times daily.   meclizine 25 MG tablet Commonly known as: ANTIVERT Take 1 tablet (25 mg total) by mouth 2 (two) times daily as needed for dizziness.   montelukast 10 MG tablet Commonly known as: SINGULAIR TAKE 1 TABLET BY MOUTH EVERYDAY AT BEDTIME   multivitamin tablet Take 1 tablet by mouth daily.   predniSONE 20 MG tablet Commonly known as: DELTASONE 60 mg x3d, 40 mg x3d, 20 mg x2d, 10 mg x2d   tiZANidine 2 MG tablet Commonly known as: ZANAFLEX Take 1 tablet (2 mg total) by mouth at bedtime.   Trelegy Ellipta 100-62.5-25 MCG/ACT Aepb Generic drug: Fluticasone-Umeclidin-Vilant INHALE 1  PUFF BY MOUTH EVERY DAY   tretinoin 0.05 % cream Commonly known as: RETIN-A APPLY TO AFFECTED AREA EVERY DAY AT BEDTIME **PA SENT**   valsartan 80 MG tablet Commonly known as: DIOVAN Take 1.5 tablets (120 mg total) by mouth daily.   VITAMIN D (CHOLECALCIFEROL) PO Take 5,000 Units by mouth daily.        All past medical history, surgical history, allergies, family history, immunizations andmedications were updated in the EMR today and reviewed under the history and medication portions of their EMR.     ROS Negative, with  the exception of above mentioned in HPI   Objective:  BP (!) 148/86    Pulse (!) 105    Temp 98.9 F (37.2 C) (Oral)    Ht 5\' 4"  (1.626 m)    Wt 109 lb (49.4 kg)    SpO2 95%    BMI 18.71 kg/m  Body mass index is 18.71 kg/m. Physical Exam Vitals and nursing note reviewed.  Constitutional:      General: She is not in acute distress.    Appearance: Normal appearance. She is not ill-appearing, toxic-appearing or diaphoretic.  HENT:     Head: Normocephalic and atraumatic.     Mouth/Throat:     Mouth: Mucous membranes are moist.  Eyes:     General: No scleral icterus.       Right eye: No discharge.        Left eye: No discharge.     Extraocular Movements: Extraocular movements intact.     Conjunctiva/sclera: Conjunctivae normal.     Pupils: Pupils are equal, round, and reactive to light.  Cardiovascular:     Rate and Rhythm: Normal rate and regular rhythm.     Heart sounds: No murmur heard.   No gallop.  Pulmonary:     Effort: Pulmonary effort is normal. No respiratory distress.     Breath sounds: Wheezing and rhonchi present. No rales.  Musculoskeletal:     Cervical back: No tenderness.     Right lower leg: No edema.     Left lower leg: No edema.  Skin:    General: Skin is warm and dry.     Coloration: Skin is not jaundiced or pale.     Findings: No erythema or rash.  Neurological:     Mental Status: She is alert and oriented to person, place,  and time. Mental status is at baseline.     Motor: No weakness.     Gait: Gait normal.  Psychiatric:        Mood and Affect: Mood normal.        Behavior: Behavior normal.        Thought Content: Thought content normal.        Judgment: Judgment normal.   No results found. No results found. No results found for this or any previous visit (from the past 24 hour(s)).  Assessment/Plan: BENTLEE DRIER is a 66 y.o. female present for OV for  COPD exacerbation (HCC)/Acute bronchitis with COPD ([place]) Continue to rest and hydrate. mucinex (DM if cough), nettie pot or nasal saline.  Continue Trelegy nightly and added extra dose of fluticasone in the morning.  Would prefer not extending another course of steroids with her if at all possible. She is not infectious at this point and additional antibiotics would not be helpful.  Patient reports understanding. DuoNeb treatment provided in office help open up airways, however some wheezing remained. Encouraged her to use her albuterol inhaler or nebulizer every 6-8 hours for the next 2 to 3 days and then slowly wean down. F/U 2 weeks if not improved- sooner if worsening.    Reviewed expectations re: course of current medical issues. Discussed self-management of symptoms. Outlined signs and symptoms indicating need for more acute intervention. Patient verbalized understanding and all questions were answered. Patient received an After-Visit Summary.    No orders of the defined types were placed in this encounter.  No orders of the defined types were placed in this encounter.  Referral Orders  No referral(s) requested today  Note is dictated utilizing voice recognition software. Although note has been proof read prior to signing, occasional typographical errors still can be missed. If any questions arise, please do not hesitate to call for verification.   electronically signed by:  Howard Pouch, DO  [place]

## 2021-06-25 ENCOUNTER — Ambulatory Visit: Payer: Medicare Other | Admitting: Family Medicine

## 2021-06-27 ENCOUNTER — Encounter: Payer: Self-pay | Admitting: Family Medicine

## 2021-06-27 DIAGNOSIS — J189 Pneumonia, unspecified organism: Secondary | ICD-10-CM

## 2021-06-28 ENCOUNTER — Ambulatory Visit (HOSPITAL_BASED_OUTPATIENT_CLINIC_OR_DEPARTMENT_OTHER)
Admission: RE | Admit: 2021-06-28 | Discharge: 2021-06-28 | Disposition: A | Discharge status: Home / Self Care | Payer: Medicare Other | Source: Ambulatory Visit | Attending: Family Medicine | Admitting: Family Medicine

## 2021-06-28 ENCOUNTER — Other Ambulatory Visit: Payer: Self-pay

## 2021-06-28 DIAGNOSIS — J189 Pneumonia, unspecified organism: Secondary | ICD-10-CM | POA: Insufficient documentation

## 2021-06-28 NOTE — Telephone Encounter (Signed)
I have placed an order for chest x-ray at [facility] she can have completed today or over the weekend.

## 2021-06-28 NOTE — Progress Notes (Signed)
[facility] [place] [address] [place] Phone: [phone] Subjective:   IVilma Meckel, am serving as a scribe for Dr. Hulan Saas. This visit occurred during the SARS-CoV-2 public health emergency.  Safety protocols were in place, including screening questions prior to the visit, additional usage of staff PPE, and extensive cleaning of exam room while observing appropriate contact time as indicated for disinfecting solutions.   I'm seeing this patient by the request  of:  Kuneff, Renee A, DO  CC: Shoulder pain.  SWN:[id number]  06/12/2021 Patient has had mod patient erate discomfort in the left shoulder region.  Given trigger point injections today.  Tolerated that procedure well.  Attempted the possibility of manipulation but did have an audible pop in the main 1 concern for potential rib fracture.  Patient did not have any pain.  No significant discomfort with any deep inspiration.  Did give patient tramadol though in case this did not seem to worsen.  Patient knows if any shortness of breath or chest pain to seek medical attention immediately.  Did have the recent passing of her mom and she was a primary caregiver which likely also contributes to some of patient's discomfort and pain.  Follow-up with me in 2 to 3 weeks.  Updated 07/01/2021 NICKY MILHOUSE is a 66 y.o. female coming in with complaint of left shoulder pain. Injections didn't help. Same pain as last time. Muscle relaxer didn't help. 1.5 pill worked but still have to take an advil or tylenol. No new complaints. Patient did have x-ray of the chest from outside facility that was independently visualized by me.  Patient does have chronic changes but seems to be consistent with some of the previous CT findings.  Questionable right lower lobe opacity though noted.      Past Medical History:  Diagnosis Date   Acute bursitis of right shoulder 09/16/2017   Injected in November 13, 2017   Acute  lateral meniscal tear 09/09/2017   Arthritis    Avulsion fracture of lateral malleolus of right fibula 07/20/2018   Basal cell carcinoma    COPD (chronic obstructive pulmonary disease) (HCC)    DVT (deep venous thrombosis) ([place])    Essential hypertension 07/27/2020   History of acute bronchitis 03/24/2011   Kidney stones    Lymphadenopathy, abdominal 03/2013   Noted on abd/pelv CT done to eval slow-to-resolve pyelo   Meniere disorder 01/23/2012   Osteoporosis 12/2014   Dr. Nori Riis, her GYN, dx'd her   Pneumonia    Pyelonephritis 10/28/2017   Recurrent pyelonephritis    Seasonal allergic rhinitis    Squamous cell carcinoma in situ (SCCIS) of skin of lower leg 05/04/2018   Dr. Janan Ridge, [facility]   Squamous cell carcinoma in situ (SCCIS) of skin of lower leg 05/04/2018   Dr. Janan Ridge, [facility]  Invasive Squamous cell carcinoma margins of excision appear free of tumor in the sections examined.  Left Lateral Leg: Superficially invasive squamous cell carcinoma, extending to the deep margin.    Past Surgical History:  Procedure Laterality Date   DEXA  12/2014   T score spine -2.4, hip -3.5 (Dr. Nori Riis, [facility].   DILATION AND CURETTAGE OF UTERUS  2009   SKIN LESION EXCISION Left 05/24/2018   SCC; Skin, Excision, Left Laeral leg: postsurgical scar, no neoplasm is identified.  Skin, Shave bx and ED&C, right anterior thigh-Superficially invasive squamous cell carcinoma, extending to the  deep margin.   Social History   Socioeconomic History   Marital status: Married    Spouse name: Not on file   Number of children: 3   Years of education: Not on file   Highest education level: Not on file  Occupational History   Occupation: housewife  Tobacco Use   Smoking status: Every Day    Packs/day: 1.00    Years: 40.00    Pack years: 40.00    Types: Cigarettes    Start date: 06/01/1978   Smokeless tobacco: Never   Tobacco comments:     still smoking a pack a day as of 09/18/2020  Vaping Use   Vaping Use: Never used  Substance and Sexual Activity   Alcohol use: Yes    Alcohol/week: 7.0 standard drinks    Types: 7 Glasses of wine per week    Comment: 1 per day   Drug use: No   Sexual activity: Yes    Partners: Male  Other Topics Concern   Not on file  Social History Narrative   Not on file   Social Determinants of Health   Financial Resource Strain: Not on file  Food Insecurity: Not on file  Transportation Needs: Not on file  Physical Activity: Not on file  Stress: Not on file  Social Connections: Not on file   No Known Allergies Family History  Problem Relation Age of Onset   Hypertension Mother    Glaucoma Mother    Irritable bowel syndrome Mother    Arthritis Mother    Kidney disease Mother    Diabetes Mother    Hypertension Father    Heart disease Father        bypasses   Cancer Father        sarcoma   Colon cancer Paternal Aunt 76   Heart disease Maternal Grandmother        CHF   Stroke Maternal Grandmother        in his 34's   Breast cancer Maternal Grandmother    Stroke Maternal Grandfather    Other Paternal Grandmother        hardening of the arteries   Heart attack Paternal Grandfather    Stomach cancer Neg Hx     Current Outpatient Medications (Endocrine & Metabolic):    alendronate (FOSAMAX) 70 MG tablet, TAKE 1 TABLET BY MOUTH EVERY 7 (SEVEN) DAYS. TAKE WITH A FULL GLASS OF WATER ON AN EMPTY STOMACH.   predniSONE (DELTASONE) 20 MG tablet, 60 mg x3d, 40 mg x3d, 20 mg x2d, 10 mg x2d  Current Outpatient Medications (Cardiovascular):    valsartan (DIOVAN) 80 MG tablet, Take 1.5 tablets (120 mg total) by mouth daily.  Current Outpatient Medications (Respiratory):    albuterol (PROVENTIL) (2.5 MG/3ML) 0.083% nebulizer solution, Take 3 mLs (2.5 mg total) by nebulization every 6 (six) hours as needed for wheezing or shortness of breath.   albuterol (VENTOLIN HFA) 108 (90 Base) MCG/ACT  inhaler, INHALE 1-2 PUFFS INTO THE LUNGS EVERY 4 (FOUR) HOURS AS NEEDED FOR WHEEZING OR SHORTNESS OF BREATH.   azelastine (ASTELIN) 0.1 % nasal spray, Place 1 spray into both nostrils 2 (two) times daily. Use in each nostril as directed   fluticasone (FLOVENT HFA) 110 MCG/ACT inhaler, Inhale 1 puff into the lungs daily.   ipratropium (ATROVENT) 0.06 % nasal spray, Place 2 sprays into both nostrils 4 (four) times daily.   montelukast (SINGULAIR) 10 MG tablet, TAKE 1 TABLET BY MOUTH EVERYDAY AT BEDTIME  TRELEGY ELLIPTA 100-62.5-25 MCG/ACT AEPB, INHALE 1 PUFF BY MOUTH EVERY DAY    Current Outpatient Medications (Other):    doxycycline (VIBRA-TABS) 100 MG tablet, Take 1 tablet (100 mg total) by mouth 2 (two) times daily.   levofloxacin (LEVAQUIN) 500 MG tablet, Take 1 tablet (500 mg total) by mouth daily for 7 days.   meclizine (ANTIVERT) 25 MG tablet, Take 1 tablet (25 mg total) by mouth 2 (two) times daily as needed for dizziness.   Multiple Vitamin (MULTIVITAMIN) tablet, Take 1 tablet by mouth daily.   Respiratory Therapy Supplies (FLUTTER) DEVI, Twice a day and prn as needed, may increase if feeling worse   Spacer/Aero-Holding Chambers (AEROCHAMBER MV) inhaler, Use as instructed   tiZANidine (ZANAFLEX) 2 MG tablet, Take 1 tablet (2 mg total) by mouth at bedtime.   tretinoin (RETIN-A) 0.05 % cream, APPLY TO AFFECTED AREA EVERY DAY AT BEDTIME **PA SENT**   VITAMIN D, CHOLECALCIFEROL, PO, Take 5,000 Units by mouth daily.    Reviewed prior external information including notes and imaging from  primary care provider As well as notes that were available from care everywhere and other healthcare systems.  Past medical history, social, surgical and family history all reviewed in electronic medical record.  No pertanent information unless stated regarding to the chief complaint.   Review of Systems:  No headache, visual changes, nausea, vomiting, diarrhea, constipation, dizziness, abdominal pain,  skin rash, fevers, chills, night sweats, weight loss, swollen lymph nodes, body aches, joint swelling, chest pain, shortness of breath, mood changes. POSITIVE muscle aches, mild cough  Objective  Blood pressure (!) 132/96, pulse (!) 102, height 5\' 4"  (1.626 m), weight 107 lb (48.5 kg), SpO2 95 %.   General: No apparent distress alert and oriented x3 mood and affect normal, dressed appropriately.  HEENT: Pupils equal, extraocular movements intact  Respiratory: Patient's speak in full sentences and does not appear short of breath patient does have a very mild cough noted though. Cardiovascular: No lower extremity edema, non tender, no erythema  Gait normal with good balance and coordination.  MSK: Tender to palpation between the parascapular areas bilaterally.  The patient does have tenderness to palpation in the parascapular region.  Tightness of the neck lacking the last 5 degrees of flexion in the last 5 degrees of extension.  Minimal pain over the midline of the thoracic spine as well.   Impression and Recommendations:     The above documentation has been reviewed and is accurate and complete Lyndal Pulley, DO

## 2021-06-28 NOTE — Telephone Encounter (Signed)
Please advise 

## 2021-07-01 ENCOUNTER — Telehealth: Payer: Self-pay | Admitting: Family Medicine

## 2021-07-01 ENCOUNTER — Encounter: Payer: Self-pay | Admitting: Family Medicine

## 2021-07-01 ENCOUNTER — Ambulatory Visit: Payer: Medicare Other | Admitting: Family Medicine

## 2021-07-01 ENCOUNTER — Other Ambulatory Visit: Payer: Self-pay

## 2021-07-01 ENCOUNTER — Ambulatory Visit (INDEPENDENT_AMBULATORY_CARE_PROVIDER_SITE_OTHER)
Admission: RE | Admit: 2021-07-01 | Discharge: 2021-07-01 | Disposition: A | Discharge status: Home / Self Care | Payer: Medicare Other | Source: Ambulatory Visit | Attending: Family Medicine | Admitting: Family Medicine

## 2021-07-01 VITALS — BP 132/96 | HR 102 | Ht 64.0 in | Wt 107.0 lb

## 2021-07-01 DIAGNOSIS — R918 Other nonspecific abnormal finding of lung field: Secondary | ICD-10-CM

## 2021-07-01 DIAGNOSIS — M25512 Pain in left shoulder: Secondary | ICD-10-CM | POA: Diagnosis not present

## 2021-07-01 DIAGNOSIS — J439 Emphysema, unspecified: Secondary | ICD-10-CM | POA: Diagnosis not present

## 2021-07-01 MED ORDER — LEVOFLOXACIN 500 MG PO TABS: 500.0000 mg | ORAL_TABLET | Freq: Every day | ORAL | 0 refills | Status: AC

## 2021-07-01 MED ORDER — METHYLPREDNISOLONE ACETATE 40 MG/ML IJ SUSP
40.0000 mg | Freq: Once | INTRAMUSCULAR | Status: AC
  Administered 2021-07-01: 40 mg via INTRAMUSCULAR

## 2021-07-01 MED ORDER — DOXYCYCLINE HYCLATE 100 MG PO TABS: 100.0000 mg | ORAL_TABLET | Freq: Two times a day (BID) | ORAL | 0 refills | Status: DC

## 2021-07-01 MED ORDER — KETOROLAC TROMETHAMINE 30 MG/ML IJ SOLN
30.0000 mg | Freq: Once | INTRAMUSCULAR | Status: AC
  Administered 2021-07-01: 30 mg via INTRAMUSCULAR

## 2021-07-01 NOTE — Patient Instructions (Addendum)
Prescription ordered CT of chest at [facility] 3rd floor See you again in 4 weeks

## 2021-07-01 NOTE — Telephone Encounter (Signed)
Spoke with pt regarding labs and instructions.   

## 2021-07-01 NOTE — Telephone Encounter (Signed)
Still awaiting results

## 2021-07-01 NOTE — Telephone Encounter (Signed)
LVM for pt to CB regarding results.  

## 2021-07-01 NOTE — Telephone Encounter (Signed)
Please inform patient: Her CXR has now resulted and appears similar to most recent CT chest.  Reading suggests her symptoms and abnormal xray is from mucous plugging, more so than a PNA currently.   Treatment for mucous plugging is proper hydration, bronchodilation (albuterol inhaler), and mucolytic agents (mucinex).  I will call in an extended course of the levaquin abx to be extra cautious with her history. I would also encourage her to follow up with her pulmonologist.

## 2021-07-01 NOTE — Assessment & Plan Note (Addendum)
Patient has had an abnormal CT of the lung done previously and does have a chest x-ray concerning for potential small pneumonia.  Discussed with patient at this point we will order a repeat CT scan with patient.  Necessary for this for screening of the lung cancer with patient being high risk.  We will get the CT scan to further evaluate and see if anything else is changed over the long run.  Patient has been given a antibiotic by primary care physician the [place].  Patient states.  She has had difficulty with this medication previously so given a prescription for doxycycline as well but discussed with her to only take 1 or the other.

## 2021-07-04 ENCOUNTER — Other Ambulatory Visit: Payer: Self-pay | Admitting: Family Medicine

## 2021-07-09 ENCOUNTER — Other Ambulatory Visit: Payer: Self-pay | Admitting: *Deleted

## 2021-07-09 DIAGNOSIS — F17210 Nicotine dependence, cigarettes, uncomplicated: Secondary | ICD-10-CM

## 2021-07-09 DIAGNOSIS — Z87891 Personal history of nicotine dependence: Secondary | ICD-10-CM

## 2021-07-15 ENCOUNTER — Ambulatory Visit (INDEPENDENT_AMBULATORY_CARE_PROVIDER_SITE_OTHER): Payer: Medicare Other | Admitting: Family Medicine

## 2021-07-15 ENCOUNTER — Encounter: Payer: Self-pay | Admitting: Family Medicine

## 2021-07-15 ENCOUNTER — Other Ambulatory Visit: Payer: Self-pay

## 2021-07-15 VITALS — BP 132/79 | HR 87 | Temp 98.2°F | Ht 64.0 in | Wt 108.0 lb

## 2021-07-15 DIAGNOSIS — I10 Essential (primary) hypertension: Secondary | ICD-10-CM

## 2021-07-15 DIAGNOSIS — Z23 Encounter for immunization: Secondary | ICD-10-CM | POA: Diagnosis not present

## 2021-07-15 DIAGNOSIS — J411 Mucopurulent chronic bronchitis: Secondary | ICD-10-CM

## 2021-07-15 DIAGNOSIS — Z131 Encounter for screening for diabetes mellitus: Secondary | ICD-10-CM | POA: Diagnosis not present

## 2021-07-15 DIAGNOSIS — Z0000 Encounter for general adult medical examination without abnormal findings: Secondary | ICD-10-CM

## 2021-07-15 DIAGNOSIS — M818 Other osteoporosis without current pathological fracture: Secondary | ICD-10-CM

## 2021-07-15 DIAGNOSIS — F17200 Nicotine dependence, unspecified, uncomplicated: Secondary | ICD-10-CM | POA: Diagnosis not present

## 2021-07-15 LAB — LIPID PANEL
Cholesterol: 185 mg/dL (ref 0–200)
HDL: 89.7 mg/dL (ref >39.00)
LDL Cholesterol: 80 mg/dL (ref 0–99)
NonHDL: 95.3
Total CHOL/HDL Ratio: 2
Triglycerides: 78 mg/dL (ref 0.0–149.0)
VLDL: 15.6 mg/dL (ref 0.0–40.0)

## 2021-07-15 LAB — COMPREHENSIVE METABOLIC PANEL
ALT: 10 U/L (ref 0–35)
AST: 24 U/L (ref 0–37)
Albumin: 4.1 g/dL (ref 3.5–5.2)
Alkaline Phosphatase: 57 U/L (ref 39–117)
BUN: 8 mg/dL (ref 6–23)
CO2: 31 mEq/L (ref 19–32)
Calcium: 9.4 mg/dL (ref 8.4–10.5)
Chloride: 100 mEq/L (ref 96–112)
Creatinine, Ser: 0.65 mg/dL (ref 0.40–1.20)
GFR: 92.48 mL/min (ref >60.00)
Glucose, Bld: 93 mg/dL (ref 70–99)
Potassium: 3.8 mEq/L (ref 3.5–5.1)
Sodium: 140 mEq/L (ref 135–145)
Total Bilirubin: 0.6 mg/dL (ref 0.2–1.2)
Total Protein: 6.4 g/dL (ref 6.0–8.3)

## 2021-07-15 LAB — CBC
HCT: 48.4 % — ABNORMAL HIGH (ref 36.0–46.0)
Hemoglobin: 16.2 g/dL — ABNORMAL HIGH (ref 12.0–15.0)
MCHC: 33.5 g/dL (ref 30.0–36.0)
MCV: 98.9 fl (ref 78.0–100.0)
Platelets: 208 10*3/uL (ref 150.0–400.0)
RBC: 4.9 Mil/uL (ref 3.87–5.11)
RDW: 15.2 % (ref 11.5–15.5)
WBC: 6.2 10*3/uL (ref 4.0–10.5)

## 2021-07-15 LAB — TSH: TSH: 2.39 u[IU]/mL (ref 0.35–5.50)

## 2021-07-15 LAB — HEMOGLOBIN A1C: Hgb A1c MFr Bld: 6.2 % (ref 4.6–6.5)

## 2021-07-15 LAB — VITAMIN D 25 HYDROXY (VIT D DEFICIENCY, FRACTURES): VITD: 79.26 ng/mL (ref 30.00–100.00)

## 2021-07-15 MED ORDER — ALENDRONATE SODIUM 70 MG PO TABS: ORAL_TABLET | ORAL | 11 refills | Status: DC

## 2021-07-15 MED ORDER — VALSARTAN 160 MG PO TABS: 160.0000 mg | ORAL_TABLET | Freq: Every day | ORAL | 1 refills | Status: DC

## 2021-07-15 MED ORDER — VALSARTAN 80 MG PO TABS: 120.0000 mg | ORAL_TABLET | Freq: Every day | ORAL | 1 refills | Status: DC

## 2021-07-15 MED ORDER — TETANUS-DIPHTH-ACELL PERTUSSIS 5-2.5-18.5 LF-MCG/0.5 IM SUSP: 0.5000 mL | Freq: Once | INTRAMUSCULAR | 0 refills | Status: AC

## 2021-07-15 MED ORDER — MECLIZINE HCL 25 MG PO TABS
25.0000 mg | ORAL_TABLET | Freq: Two times a day (BID) | ORAL | 5 refills | Status: DC | PRN
Start: 2021-07-15 — End: 2022-11-04

## 2021-07-15 NOTE — Patient Instructions (Signed)
Health Maintenance After Age 65 After age 65, you are at a higher risk for certain long-term diseases and infections as well as injuries from falls. Falls are a major cause of broken bones and head injuries in people who are older than age 65. Getting regular preventive care can help to keep you healthy and well. Preventive care includes getting regular testing and making lifestyle changes as recommended by your health care provider. Talk with your health care provider about: Which screenings and tests you should have. A screening is a test that checks for a disease when you have no symptoms. A diet and exercise plan that is right for you. What should I know about screenings and tests to prevent falls? Screening and testing are the best ways to find a health problem early. Early diagnosis and treatment give you the best chance of managing medical conditions that are common after age 65. Certain conditions and lifestyle choices may make you more likely to have a fall. Your health care provider may recommend: Regular vision checks. Poor vision and conditions such as cataracts can make you more likely to have a fall. If you wear glasses, make sure to get your prescription updated if your vision changes. Medicine review. Work with your health care provider to regularly review all of the medicines you are taking, including over-the-counter medicines. Ask your health care provider about any side effects that may make you more likely to have a fall. Tell your health care provider if any medicines that you take make you feel dizzy or sleepy. Strength and balance checks. Your health care provider may recommend certain tests to check your strength and balance while standing, walking, or changing positions. Foot health exam. Foot pain and numbness, as well as not wearing proper footwear, can make you more likely to have a fall. Screenings, including: Osteoporosis screening. Osteoporosis is a condition that causes  the bones to get weaker and break more easily. Blood pressure screening. Blood pressure changes and medicines to control blood pressure can make you feel dizzy. Depression screening. You may be more likely to have a fall if you have a fear of falling, feel depressed, or feel unable to do activities that you used to do. Alcohol use screening. Using too much alcohol can affect your balance and may make you more likely to have a fall. Follow these instructions at home: Lifestyle Do not drink alcohol if: Your health care provider tells you not to drink. If you drink alcohol: Limit how much you have to: 0-1 drink a day for women. 0-2 drinks a day for men. Know how much alcohol is in your drink. In the [place] one drink equals one 12 oz bottle of beer (355 mL), one 5 oz glass of wine (148 mL), or one 1 oz glass of hard liquor (44 mL). Do not use any products that contain nicotine or tobacco. These products include cigarettes, chewing tobacco, and vaping devices, such as e-cigarettes. If you need help quitting, ask your health care provider. Activity  Follow a regular exercise program to stay fit. This will help you maintain your balance. Ask your health care provider what types of exercise are appropriate for you. If you need a cane or walker, use it as recommended by your health care provider. Wear supportive shoes that have nonskid soles. Safety  Remove any tripping hazards, such as rugs, cords, and clutter. Install safety equipment such as grab bars in bathrooms and safety rails on stairs. Keep rooms and walkways   well-lit. General instructions Talk with your health care provider about your risks for falling. Tell your health care provider if: You fall. Be sure to tell your health care provider about all falls, even ones that seem minor. You feel dizzy, tiredness (fatigue), or off-balance. Take over-the-counter and prescription medicines only as told by your health care provider. These include  supplements. Eat a healthy diet and maintain a healthy weight. A healthy diet includes low-fat dairy products, low-fat (lean) meats, and fiber from whole grains, beans, and lots of fruits and vegetables. Stay current with your vaccines. Schedule regular health, dental, and eye exams. Summary Having a healthy lifestyle and getting preventive care can help to protect your health and wellness after age 65. Screening and testing are the best way to find a health problem early and help you avoid having a fall. Early diagnosis and treatment give you the best chance for managing medical conditions that are more common for people who are older than age 65. Falls are a major cause of broken bones and head injuries in people who are older than age 65. Take precautions to prevent a fall at home. Work with your health care provider to learn what changes you can make to improve your health and wellness and to prevent falls. This information is not intended to replace advice given to you by your health care provider. Make sure you discuss any questions you have with your health care provider. Document Revised: 09/10/2020 Document Reviewed: 09/10/2020 Elsevier Patient Education  2022 [employer] Inc.  

## 2021-07-15 NOTE — Progress Notes (Signed)
This visit occurred during the SARS-CoV-2 public health emergency.  Safety protocols were in place, including screening questions prior to the visit, additional usage of staff PPE, and extensive cleaning of exam room while observing appropriate contact time as indicated for disinfecting solutions.    [name], female  DOB: Oct 13, 1955, 66 y.o.   MRN: [id number] Patient Care Team    Relationship Specialty Notifications Start End  Ma Hillock, DO PCP - General Family Medicine  04/23/15   Volanda Napoleon, MD Consulting Physician Oncology  05/12/13   Maisie Fus, MD Consulting Physician Obstetrics and Gynecology  01/01/15   Chesley Mires, MD Consulting Physician Pulmonary Disease  02/22/16   Carolan Clines, MD (Inactive) Consulting Physician Urology  04/21/16   Ayesha Mohair  Dermatology  07/04/16    Comment: [facility], Olevia Bowens, DO Consulting Physician Sports Medicine  05/17/18   Plonk, Noni Saupe, PA-C  Physician Assistant  05/25/18     Chief Complaint  Patient presents with   Annual Exam    Pt is fasting    Subjective: [name] is a 66 y.o.  Female  present for CPE All past medical history, surgical history, allergies, family history, immunizations, medications and social history were updated in the electronic medical record today. All recent labs, ED visits and hospitalizations within the last year were reviewed.  Health maintenance:  Colonoscopy: completed 05/17/2019, by Dr. Carlean Purl. follow up 10 yr. Mammogram: completed:07/2020, GYN.  Cervical cancer screening: last pap: 07/2020, completed by: gyn Immunizations: tdap DUE - printed, Influenza UTD 2022 (encouraged yearly), PNA series DUE now that she is 65 completed today, zostavax completed Infectious disease screening: HIV and Hep C completed DEXA: last completed 08/2019, result -3.6-osteoporosis , follow needed 2024 Assistive device: none Oxygen  GUY:QIHK Patient has a Dental home. Hospitalizations/ED visits: reviewed  Primary hypertension/family history of heart disease Pt reports compliance with valsartan 80 (1.5 tabs). Blood pressures ranges at home not routinely checked.  Patient denies chest pain, shortness of breath, dizziness or lower extremity edema..  Pt takes a  daily baby ASA. Pt is not prescribed statin. RF: htn, smoker, fhx heart disease.    Osteoporosis without current pathological fracture, unspecified osteoporosis type/Current every day smoker Patient last DEXA 08/2020 with osteoporosis.  She has been on Fosamax weekly dosing and is tolerating.  She has been encouraged to stop smoking, continues to smoke daily.  She is taking her vitamin D supplementation.   COPD /pulmonary nodules/lesions, multiple/Abnormal CT scan of lung Follows with pulm, has had multiple recent flares.   Depression screen [facility] 2/9 07/15/2021 06/12/2021 06/27/2020 11/21/2019 10/28/2017  Decreased Interest 0 0 0 0 0  Down, Depressed, Hopeless 0 0 0 0 0  PHQ - 2 Score 0 0 0 0 0  Some recent data might be hidden   No flowsheet data found.    Immunization History  Administered Date(s) Administered   Influenza Inj Mdck Quad Pf 02/28/2020   Influenza Split 04/09/2011   Influenza Whole 02/02/2009   Influenza,inj,Quad PF,6+ Mos 01/26/2014, 02/06/2017, 03/04/2018, 01/26/2019, 01/14/2021   Influenza-Unspecified 02/02/2013, 02/24/2015, 04/03/2016   Moderna Sars-Covid-2 Vaccination 07/19/2019, 08/16/2019   PNEUMOCOCCAL CONJUGATE-20 07/15/2021   Pneumococcal Polysaccharide-23 01/26/2019   Zoster Recombinat (Shingrix) 03/04/2018, 01/14/2021    Past Medical History:  Diagnosis Date   Acute bursitis of right shoulder 09/16/2017   Injected in November 13, 2017   Acute lateral meniscal tear 09/09/2017  Arthritis    Avulsion fracture of lateral malleolus of right fibula 07/20/2018   Basal cell carcinoma    COPD (chronic obstructive pulmonary disease) (HCC)     DVT (deep venous thrombosis) ([place])    Essential hypertension 07/27/2020   History of acute bronchitis 03/24/2011   Kidney stones    Lymphadenopathy, abdominal 03/2013   Noted on abd/pelv CT done to eval slow-to-resolve pyelo   Meniere disorder 01/23/2012   Osteoporosis 12/2014   Dr. Nori Riis, her GYN, dx'd her   Pneumonia    Pyelonephritis 10/28/2017   Recurrent pyelonephritis    Seasonal allergic rhinitis    Squamous cell carcinoma in situ (SCCIS) of skin of lower leg 05/04/2018   Dr. Janan Ridge, [facility]   Squamous cell carcinoma in situ (SCCIS) of skin of lower leg 05/04/2018   Dr. Janan Ridge, [facility]  Invasive Squamous cell carcinoma margins of excision appear free of tumor in the sections examined.  Left Lateral Leg: Superficially invasive squamous cell carcinoma, extending to the deep margin.    No Known Allergies Past Surgical History:  Procedure Laterality Date   DEXA  12/2014   T score spine -2.4, hip -3.5 (Dr. Nori Riis, [facility]   DILATION AND CURETTAGE OF UTERUS  2009   SKIN LESION EXCISION Left 05/24/2018   SCC; Skin, Excision, Left Laeral leg: postsurgical scar, no neoplasm is identified.  Skin, Shave bx and ED&C, right anterior thigh-Superficially invasive squamous cell carcinoma, extending to the deep margin.   Family History  Problem Relation Age of Onset   Hypertension Mother    Glaucoma Mother    Irritable bowel syndrome Mother    Arthritis Mother    Kidney disease Mother    Diabetes Mother    Hypertension Father    Heart disease Father        bypasses   Cancer Father        sarcoma   Colon cancer Paternal Aunt 1   Heart disease Maternal Grandmother        CHF   Stroke Maternal Grandmother        in his 13's   Breast cancer Maternal Grandmother    Stroke Maternal Grandfather    Other Paternal Grandmother        hardening of the arteries   Heart attack Paternal Grandfather    Stomach cancer Neg Hx     Social History   Social History Narrative   Not on file    Allergies as of 07/15/2021   No Known Allergies      Medication List        Accurate as of July 15, 2021  9:01 AM. If you have any questions, ask your nurse or doctor.          STOP taking these medications    doxycycline 100 MG tablet Commonly known as: VIBRA-TABS Stopped by: Howard Pouch, DO   fluticasone 110 MCG/ACT inhaler Commonly known as: Flovent HFA Stopped by: Howard Pouch, DO   predniSONE 20 MG tablet Commonly known as: DELTASONE Stopped by: Howard Pouch, DO   tiZANidine 2 MG tablet Commonly known as: ZANAFLEX Stopped by: Howard Pouch, DO       TAKE these medications    AeroChamber MV inhaler Use as instructed   albuterol (2.5 MG/3ML) 0.083% nebulizer solution Commonly known as: PROVENTIL Take 3 mLs (2.5 mg total) by nebulization every 6 (six) hours as needed for wheezing or shortness of breath.  albuterol 108 (90 Base) MCG/ACT inhaler Commonly known as: VENTOLIN HFA INHALE 1-2 PUFFS INTO THE LUNGS EVERY 4 (FOUR) HOURS AS NEEDED FOR WHEEZING OR SHORTNESS OF BREATH.   alendronate 70 MG tablet Commonly known as: FOSAMAX TAKE 1 TABLET BY MOUTH EVERY 7 (SEVEN) DAYS. TAKE WITH A FULL GLASS OF WATER ON AN EMPTY STOMACH.   azelastine 0.1 % nasal spray Commonly known as: ASTELIN Place 1 spray into both nostrils 2 (two) times daily. Use in each nostril as directed   Flutter Devi Twice a day and prn as needed, may increase if feeling worse   ipratropium 0.06 % nasal spray Commonly known as: ATROVENT Place 2 sprays into both nostrils 4 (four) times daily.   meclizine 25 MG tablet Commonly known as: ANTIVERT Take 1 tablet (25 mg total) by mouth 2 (two) times daily as needed for dizziness.   montelukast 10 MG tablet Commonly known as: SINGULAIR TAKE 1 TABLET BY MOUTH EVERYDAY AT BEDTIME   multivitamin tablet Take 1 tablet by mouth daily.   Tdap 5-2.5-18.5 LF-MCG/0.5  injection Commonly known as: BOOSTRIX Inject 0.5 mLs into the muscle once for 1 dose. Started by: Howard Pouch, DO   Trelegy Ellipta 100-62.5-25 MCG/ACT Aepb Generic drug: Fluticasone-Umeclidin-Vilant INHALE 1 PUFF BY MOUTH EVERY DAY   tretinoin 0.05 % cream Commonly known as: RETIN-A APPLY TO AFFECTED AREA EVERY DAY AT BEDTIME **PA SENT**   valsartan 160 MG tablet Commonly known as: DIOVAN Take 1 tablet (160 mg total) by mouth daily. What changed:  medication strength how much to take Changed by: Howard Pouch, DO   VITAMIN D (CHOLECALCIFEROL) PO Take 5,000 Units by mouth daily.        All past medical history, surgical history, allergies, family history, immunizations andmedications were updated in the EMR today and reviewed under the history and medication portions of their EMR.     No results found for this or any previous visit (from the past 2160 hour(s)).  CT CHEST WO CONTRAST Result Date: 07/01/2021 IMPRESSION: 1. No evidence for acute pneumonia. 2. Similar appearance of peribronchovascular nodularity and mucoid impaction, most prominent in the right middle lobe, lingula and both lower lobes. Findings are likely post infectious in etiology. Chronic atypical indolent process such as MAI is not excluded. 3. Stable appearance of previously noted lung nodules including the dominant nodule in the medial aspect of the right upper lobe near the apex measuring 0.8 cm on today's exam. 4. Aortic Atherosclerosis (ICD10-I70.0) and Emphysema (ICD10-J43.9). Electronically Signed   By: Kerby Moors M.D.   On: 07/01/2021 14:50    ROS 14 pt review of systems performed and negative (unless mentioned in an HPI)  Objective: BP 132/79    Pulse 87    Temp 98.2 F (36.8 C) (Oral)    Ht '5\' 4"'$  (1.626 m)    Wt 108 lb (49 kg)    SpO2 94%    BMI 18.54 kg/m  Physical Exam Vitals and nursing note reviewed.  Constitutional:      General: She is not in acute distress.    Appearance: Normal  appearance. She is not ill-appearing or toxic-appearing.     Comments: thin  HENT:     Head: Normocephalic and atraumatic.     Right Ear: Tympanic membrane, ear canal and external ear normal. There is no impacted cerumen.     Left Ear: Tympanic membrane, ear canal and external ear normal. There is no impacted cerumen.     Nose: No congestion  or rhinorrhea.     Mouth/Throat:     Mouth: Mucous membranes are moist.     Pharynx: Oropharynx is clear. No oropharyngeal exudate or posterior oropharyngeal erythema.  Eyes:     General: No scleral icterus.       Right eye: No discharge.        Left eye: No discharge.     Extraocular Movements: Extraocular movements intact.     Conjunctiva/sclera: Conjunctivae normal.     Pupils: Pupils are equal, round, and reactive to light.  Cardiovascular:     Rate and Rhythm: Normal rate and regular rhythm.     Pulses: Normal pulses.     Heart sounds: Normal heart sounds. No murmur heard.   No friction rub. No gallop.  Pulmonary:     Effort: Pulmonary effort is normal. No respiratory distress.     Breath sounds: Normal breath sounds. No stridor. No wheezing, rhonchi or rales.  Chest:     Chest wall: No tenderness.  Abdominal:     General: Abdomen is flat. Bowel sounds are normal. There is no distension.     Palpations: Abdomen is soft. There is no mass.     Tenderness: There is no abdominal tenderness. There is no right CVA tenderness, left CVA tenderness, guarding or rebound.     Hernia: No hernia is present.  Musculoskeletal:        General: No swelling, tenderness or deformity. Normal range of motion.     Cervical back: Normal range of motion and neck supple. No rigidity or tenderness.     Right lower leg: No edema.     Left lower leg: No edema.  Lymphadenopathy:     Cervical: No cervical adenopathy.  Skin:    General: Skin is warm and dry.     Coloration: Skin is not jaundiced or pale.     Findings: No bruising, erythema, lesion or rash.   Neurological:     General: No focal deficit present.     Mental Status: She is alert and oriented to person, place, and time. Mental status is at baseline.     Cranial Nerves: No cranial nerve deficit.     Sensory: No sensory deficit.     Motor: No weakness.     Coordination: Coordination normal.     Gait: Gait normal.     Deep Tendon Reflexes: Reflexes normal.  Psychiatric:        Mood and Affect: Mood normal.        Behavior: Behavior normal.        Thought Content: Thought content normal.        Judgment: Judgment normal.     No results found.  Assessment/plan: MARLEENA SHUBERT is a 66 y.o. female present for CPE  Primary hypertension/family history of heart disease stable increased valsartan 120>160 mg tQD Goal blood pressure:<130/80 - consider cardiac CT - cbc, cmp,tsh, lipids F/u 5.5 mos.   Osteoporosis without current pathological fracture, unspecified osteoporosis type/Current every day smoker - continue  fosamax weekly - encouraged to stop smoking.  - Vitamin D collected today - dexa due 14/2024 (last -3.6)   COPD/pulmonary nodules/lesions, multiple/Abnormal CT scan of lung Follows with pulm. Continue follow-up with pulm for management of COPD/inhalers. Encouraged her to cut back on smoking and consider quitting.   Elevated hemoglobin: She has had chronic elevated hemoglobin dating back as far as 2015, that has steadily increased over the last few years.  She had declined referral to hematology  in March 2022 for this condition. Decreasing tobacco use will help decrease hemoglobin. Encouraged hydration. Discussed possible benefits of daily baby aspirin. Current every day smoker Encourage to quit Other osteoporosis without current pathological fracture - Vitamin D (25 hydroxy)  Diabetes mellitus screening - Hemoglobin A1c Need for pneumococcal vaccination - Pneumococcal conjugate vaccine 20-valent (Prevnar 20) Need for diphtheria-tetanus-pertussis (Tdap)  vaccine - Tdap (BOOSTRIX) 5-2.5-18.5 LF-MCG/0.5 injection; Inject 0.5 mLs into the muscle once for 1 dose.  Dispense: 0.5 mL; Refill: 0  Need for vaccination for pneumococcus Provided today Routine general medical examination at a health care facility Colonoscopy: completed 05/17/2019, by Dr. Carlean Purl. follow up 10 yr. Mammogram: completed:07/2020, GYN.  Cervical cancer screening: last pap: 07/2020, completed by: gyn Immunizations: tdap DUE - printed, Influenza UTD 2022 (encouraged yearly), PNA series DUE now that she is 65 completed today, zostavax completed Infectious disease screening: HIV and Hep C completed DEXA: last completed 08/2019, result -3.6-osteoporosis , follow needed 2024 Patient was encouraged to exercise greater than 150 minutes a week. Patient was encouraged to choose a diet filled with fresh fruits and vegetables, and lean meats. AVS provided to patient today for education/recommendation on gender specific health and safety maintenance.   No follow-ups on file.  Orders Placed This Encounter  Procedures   Pneumococcal conjugate vaccine 20-valent (Prevnar 20)   CBC   Comprehensive metabolic panel   TSH   Hemoglobin A1c   Lipid panel   Vitamin D (25 hydroxy)    Orders Placed This Encounter  Procedures   Pneumococcal conjugate vaccine 20-valent (Prevnar 20)   CBC   Comprehensive metabolic panel   TSH   Hemoglobin A1c   Lipid panel   Vitamin D (25 hydroxy)   Meds ordered this encounter  Medications   Tdap (BOOSTRIX) 5-2.5-18.5 LF-MCG/0.5 injection    Sig: Inject 0.5 mLs into the muscle once for 1 dose.    Dispense:  0.5 mL    Refill:  0   alendronate (FOSAMAX) 70 MG tablet    Sig: TAKE 1 TABLET BY MOUTH EVERY 7 (SEVEN) DAYS. TAKE WITH A FULL GLASS OF WATER ON AN EMPTY STOMACH.    Dispense:  4 tablet    Refill:  11   meclizine (ANTIVERT) 25 MG tablet    Sig: Take 1 tablet (25 mg total) by mouth 2 (two) times daily as needed for dizziness.    Dispense:  30  tablet    Refill:  5   DISCONTD: valsartan (DIOVAN) 80 MG tablet    Sig: Take 1.5 tablets (120 mg total) by mouth daily.    Dispense:  135 tablet    Refill:  1   valsartan (DIOVAN) 160 MG tablet    Sig: Take 1 tablet (160 mg total) by mouth daily.    Dispense:  90 tablet    Refill:  1    Change in dose. Dc lower doses please. Thanks.   Referral Orders  No referral(s) requested today     Electronically signed by: Howard Pouch, [place]

## 2021-08-11 ENCOUNTER — Other Ambulatory Visit: Payer: Self-pay | Admitting: Pulmonary Disease

## 2021-09-05 DIAGNOSIS — Z124 Encounter for screening for malignant neoplasm of cervix: Secondary | ICD-10-CM | POA: Diagnosis not present

## 2021-09-05 DIAGNOSIS — Z1151 Encounter for screening for human papillomavirus (HPV): Secondary | ICD-10-CM | POA: Diagnosis not present

## 2021-09-05 DIAGNOSIS — Z1231 Encounter for screening mammogram for malignant neoplasm of breast: Secondary | ICD-10-CM | POA: Diagnosis not present

## 2021-09-05 DIAGNOSIS — Z01419 Encounter for gynecological examination (general) (routine) without abnormal findings: Secondary | ICD-10-CM | POA: Diagnosis not present

## 2021-09-05 DIAGNOSIS — M816 Localized osteoporosis [Lequesne]: Secondary | ICD-10-CM | POA: Diagnosis not present

## 2021-09-05 DIAGNOSIS — Z681 Body mass index (BMI) 19 or less, adult: Secondary | ICD-10-CM | POA: Diagnosis not present

## 2021-09-05 LAB — RESULTS CONSOLE HPV: CHL HPV: NEGATIVE

## 2021-09-05 LAB — HM PAP SMEAR

## 2021-09-06 ENCOUNTER — Ambulatory Visit: Payer: Medicare Other | Admitting: Pulmonary Disease

## 2021-09-06 ENCOUNTER — Encounter: Payer: Self-pay | Admitting: Pulmonary Disease

## 2021-09-06 VITALS — BP 138/86 | HR 82 | Ht 64.0 in | Wt 108.2 lb

## 2021-09-06 DIAGNOSIS — J411 Mucopurulent chronic bronchitis: Secondary | ICD-10-CM | POA: Diagnosis not present

## 2021-09-06 DIAGNOSIS — J449 Chronic obstructive pulmonary disease, unspecified: Secondary | ICD-10-CM | POA: Diagnosis not present

## 2021-09-06 DIAGNOSIS — F17200 Nicotine dependence, unspecified, uncomplicated: Secondary | ICD-10-CM | POA: Diagnosis not present

## 2021-09-06 DIAGNOSIS — J479 Bronchiectasis, uncomplicated: Secondary | ICD-10-CM | POA: Diagnosis not present

## 2021-09-06 NOTE — Progress Notes (Signed)
? ?[facility] ? ?Chief Complaint  ?Patient presents with  ? Follow-up  ?  6 mo f/u for COPD. States she has been coughing up green phlegm.   ? ? ?Past Surgical History:  ?She  has a past surgical history that includes Dilation and curettage of uterus (2009); DEXA (12/2014); and Skin lesion excision (Left, 05/24/2018). ? ?Past Medical History:  ?Pyelonephritis, Osteoporosis, Meniere disorder, Basal cell carcinoma ? ?Constitutional:  ?BP 138/86   Pulse 82   Ht '5\' 4"'$  (1.626 m)   Wt 108 lb 3.2 oz (49.1 kg)   SpO2 95% Comment: on RA  BMI 18.57 kg/m?  ? ?Brief Summary:  ?[name] is a 66 y.o. female smoker with COPD from asthma, emphysema, chronic bronchitis and bronchiectasis. ?  ? ? ? ?Subjective:  ? ?Having more sinus congestion and post nasal drip.  Has some cough with green sputum.  No fever or hemoptysis.  Sleeping okay.  Still smoked 1 ppd. ? ?Physical Exam:  ? ?Appearance - well kempt, thin ? ?ENMT - no sinus tenderness, no oral exudate, no LAN, Mallampati 2 airway, no stridor ? ?Respiratory - decreased breath sounds bilaterally, no wheezing or rales ? ?CV - s1s2 regular rate and rhythm, no murmurs ? ?Ext - no clubbing, no edema ? ?Skin - no rashes ? ?Psych - normal mood and affect ?  ?Pulmonary testing:  ?PFT 12/15/08 FEV1 2.20(90%), FVC 3.68(113%), FEV1% 60, TLC 6.54(132%), DLCO 75%, no BD response ?Sputum culture 06/27/16 >> Moraxella catarrhalis ?PFT 08/25/16 >> FEV1 1.55 (59%), FEV1% 57, TLC 5.33 (103%), DLCO 52% ?A1AT 07/14/18 >> 166, MM ? ?Chest Imaging:  ?CT chest 04/21/16 >> borderline LAN, nodules in lung bases with tree in bud ?CT chest 10/22/16 >> moderate centrilobular emphysema, 1.2 cm nodule LUL, tree in bud, RLL BTX ?CT chest 01/26/17 >> moderate centrilobular emphysema, mild b/l BTX with tree in bud, LUL nodule resolved, stable 7 mm RLL nodule since 2015 ?HRCT 12/01/18 >> atherosclerosis, apical scarring, centrilobular emphysema, nodularity in RLL, 6  mm nodule Rt major fissure ?CT chest 07/01/21 >> moderate centrilobular emphysema, scattered nodularity and mucoid impaction most in RML and lingula ? ?Sleep Tests:  ?ONO with RA 07/29/18 >> test time 6 hrs 23 min.  Basal SpO2 86%, low SpO2 72%.  Spent 289.2 min with SpO2 < 88%. ? ?Social History:  ?She  reports that she has been smoking cigarettes. She started smoking about 43 years ago. She has a 40.00 pack-year smoking history. She has never used smokeless tobacco. She reports current alcohol use of about 7.0 standard drinks per week. She reports that she does not use drugs. ? ?Family History:  ?Her family history includes Arthritis in her mother; Breast cancer in her maternal grandmother; Cancer in her father; Colon cancer (age of onset: 54) in her paternal aunt; Diabetes in her mother; Glaucoma in her mother; Heart attack in her paternal grandfather; Heart disease in her father and maternal grandmother; Hypertension in her father and mother; Irritable bowel syndrome in her mother; Kidney disease in her mother; Other in her paternal grandmother; Stroke in her maternal grandfather and maternal grandmother. ?  ? ? ?Assessment/Plan:  ? ?COPD with chronic bronchitis, asthma, bronchiectasis and emphysema. ?- continue trelegy 100 one puff daily and singulair 10 mg qhs ?- prn albuterol ?  ?Tobacco abuse. ?- reviewed options to help with smoking cessation ?- she will call when she is ready to quit ?  ?Allergic rhinitis. ?- she  uses allegra in the morning and xyzal in the evening ?- continue singulair, astelin ?  ?Time Spent Involved in Patient Care on Day of Examination:  ?35 minutes ? ?Follow up:  ? ?Patient Instructions  ?Follow up in 1 year ? ?Medication List:  ? ?Allergies as of 09/06/2021   ?No Known Allergies ?  ? ?  ?Medication List  ?  ? ?  ? Accurate as of Sep 06, 2021 12:25 PM. If you have any questions, ask your nurse or doctor.  ?  ?  ? ?  ? ?AeroChamber MV inhaler ?Use as instructed ?  ?albuterol (2.5 MG/3ML)  0.083% nebulizer solution ?Commonly known as: PROVENTIL ?Take 3 mLs (2.5 mg total) by nebulization every 6 (six) hours as needed for wheezing or shortness of breath. ?  ?albuterol 108 (90 Base) MCG/ACT inhaler ?Commonly known as: VENTOLIN HFA ?INHALE 1-2 PUFFS INTO THE LUNGS EVERY 4 (FOUR) HOURS AS NEEDED FOR WHEEZING OR SHORTNESS OF BREATH. ?  ?alendronate 70 MG tablet ?Commonly known as: FOSAMAX ?TAKE 1 TABLET BY MOUTH EVERY 7 (SEVEN) DAYS. TAKE WITH A FULL GLASS OF WATER ON AN EMPTY STOMACH. ?  ?azelastine 0.1 % nasal spray ?Commonly known as: ASTELIN ?Place 1 spray into both nostrils 2 (two) times daily. Use in each nostril as directed ?  ?Flutter Devi ?Twice a day and prn as needed, may increase if feeling worse ?  ?ipratropium 0.06 % nasal spray ?Commonly known as: ATROVENT ?Place 2 sprays into both nostrils 4 (four) times daily. ?  ?meclizine 25 MG tablet ?Commonly known as: ANTIVERT ?Take 1 tablet (25 mg total) by mouth 2 (two) times daily as needed for dizziness. ?  ?montelukast 10 MG tablet ?Commonly known as: SINGULAIR ?TAKE 1 TABLET BY MOUTH EVERYDAY AT BEDTIME ?  ?multivitamin tablet ?Take 1 tablet by mouth daily. ?  ?Trelegy Ellipta 100-62.5-25 MCG/ACT Aepb ?Generic drug: Fluticasone-Umeclidin-Vilant ?INHALE 1 PUFF BY MOUTH EVERY DAY ?  ?tretinoin 0.05 % cream ?Commonly known as: RETIN-A ?APPLY TO AFFECTED AREA EVERY DAY AT BEDTIME **PA SENT** ?  ?valsartan 160 MG tablet ?Commonly known as: DIOVAN ?Take 1 tablet (160 mg total) by mouth daily. ?  ?VITAMIN D (CHOLECALCIFEROL) PO ?Take 5,000 Units by mouth daily. ?  ? ?  ? ? ?Signature:  ?Chesley Mires, MD ?[place] ?Pager - [phone] ?09/06/2021, 12:25 PM ?  ? ? ? ? ? ? ? ? ?

## 2021-09-06 NOTE — Patient Instructions (Signed)
Follow up in 1 year.

## 2021-09-09 ENCOUNTER — Other Ambulatory Visit: Payer: Self-pay | Admitting: Obstetrics and Gynecology

## 2021-09-09 DIAGNOSIS — R928 Other abnormal and inconclusive findings on diagnostic imaging of breast: Secondary | ICD-10-CM

## 2021-09-19 ENCOUNTER — Ambulatory Visit: Payer: Medicare Other

## 2021-09-19 ENCOUNTER — Ambulatory Visit
Admission: RE | Admit: 2021-09-19 | Discharge: 2021-09-19 | Disposition: A | Discharge status: Home / Self Care | Payer: Medicare Other | Source: Ambulatory Visit | Attending: Obstetrics and Gynecology | Admitting: Obstetrics and Gynecology

## 2021-09-19 DIAGNOSIS — R928 Other abnormal and inconclusive findings on diagnostic imaging of breast: Secondary | ICD-10-CM

## 2021-09-19 LAB — HM MAMMOGRAPHY

## 2021-10-28 ENCOUNTER — Encounter: Payer: Self-pay | Admitting: Family Medicine

## 2021-10-28 ENCOUNTER — Ambulatory Visit (INDEPENDENT_AMBULATORY_CARE_PROVIDER_SITE_OTHER): Payer: Medicare Other | Admitting: Family Medicine

## 2021-10-28 VITALS — BP 138/84 | HR 109 | Temp 100.3°F | Ht 64.0 in | Wt 108.0 lb

## 2021-10-28 DIAGNOSIS — R062 Wheezing: Secondary | ICD-10-CM

## 2021-10-28 DIAGNOSIS — J411 Mucopurulent chronic bronchitis: Secondary | ICD-10-CM | POA: Diagnosis not present

## 2021-10-28 DIAGNOSIS — R509 Fever, unspecified: Secondary | ICD-10-CM | POA: Diagnosis not present

## 2021-10-28 LAB — POC COVID19 BINAXNOW: SARS Coronavirus 2 Ag: NEGATIVE

## 2021-10-28 MED ORDER — PREDNISONE 20 MG PO TABS: ORAL_TABLET | ORAL | 0 refills | Status: DC

## 2021-10-28 MED ORDER — METHYLPREDNISOLONE ACETATE 80 MG/ML IJ SUSP
80.0000 mg | Freq: Once | INTRAMUSCULAR | Status: AC
  Administered 2021-10-28: 80 mg via INTRAMUSCULAR

## 2021-10-28 MED ORDER — IPRATROPIUM-ALBUTEROL 0.5-2.5 (3) MG/3ML IN SOLN
3.0000 mL | Freq: Once | RESPIRATORY_TRACT | Status: AC
  Administered 2021-10-28: 3 mL via RESPIRATORY_TRACT

## 2021-10-28 MED ORDER — LEVOFLOXACIN 500 MG PO TABS: 500.0000 mg | ORAL_TABLET | Freq: Every day | ORAL | 0 refills | Status: AC

## 2021-10-29 ENCOUNTER — Ambulatory Visit (HOSPITAL_BASED_OUTPATIENT_CLINIC_OR_DEPARTMENT_OTHER)
Admission: RE | Admit: 2021-10-29 | Discharge: 2021-10-29 | Disposition: A | Discharge status: Home / Self Care | Payer: Medicare Other | Source: Ambulatory Visit | Attending: Family Medicine | Admitting: Family Medicine

## 2021-10-29 DIAGNOSIS — R509 Fever, unspecified: Secondary | ICD-10-CM | POA: Insufficient documentation

## 2021-10-29 DIAGNOSIS — J411 Mucopurulent chronic bronchitis: Secondary | ICD-10-CM | POA: Insufficient documentation

## 2021-10-29 DIAGNOSIS — R059 Cough, unspecified: Secondary | ICD-10-CM | POA: Diagnosis not present

## 2021-10-30 ENCOUNTER — Telehealth: Payer: Self-pay

## 2021-10-30 NOTE — Telephone Encounter (Signed)
Patient returning call regarding labs.  I could not find documentation.  Patient states she has already read her mychart results. Please call back and leave detailed message , she will not be able by phone.

## 2021-10-30 NOTE — Telephone Encounter (Signed)
Spoke with patient regarding results/recommendations.  

## 2021-11-14 DIAGNOSIS — Z0100 Encounter for examination of eyes and vision without abnormal findings: Secondary | ICD-10-CM | POA: Diagnosis not present

## 2021-11-22 ENCOUNTER — Other Ambulatory Visit: Payer: Self-pay | Admitting: Pulmonary Disease

## 2021-12-18 ENCOUNTER — Other Ambulatory Visit: Payer: Self-pay

## 2021-12-23 ENCOUNTER — Other Ambulatory Visit: Payer: Self-pay

## 2021-12-23 MED ORDER — VALSARTAN 160 MG PO TABS: 160.0000 mg | ORAL_TABLET | Freq: Every day | ORAL | 0 refills | Status: DC

## 2021-12-30 ENCOUNTER — Encounter: Payer: Self-pay | Admitting: Family Medicine

## 2021-12-30 ENCOUNTER — Ambulatory Visit (INDEPENDENT_AMBULATORY_CARE_PROVIDER_SITE_OTHER): Payer: Medicare Other | Admitting: Family Medicine

## 2021-12-30 VITALS — BP 124/79 | HR 71 | Temp 98.1°F | Ht 64.0 in | Wt 108.0 lb

## 2021-12-30 DIAGNOSIS — J411 Mucopurulent chronic bronchitis: Secondary | ICD-10-CM | POA: Diagnosis not present

## 2021-12-30 DIAGNOSIS — R7309 Other abnormal glucose: Secondary | ICD-10-CM | POA: Diagnosis not present

## 2021-12-30 DIAGNOSIS — F17200 Nicotine dependence, unspecified, uncomplicated: Secondary | ICD-10-CM

## 2021-12-30 DIAGNOSIS — I10 Essential (primary) hypertension: Secondary | ICD-10-CM

## 2021-12-30 DIAGNOSIS — M818 Other osteoporosis without current pathological fracture: Secondary | ICD-10-CM

## 2021-12-30 LAB — POCT GLYCOSYLATED HEMOGLOBIN (HGB A1C)
HbA1c POC (<> result, manual entry): 5.4 % (ref 4.0–5.6)
HbA1c, POC (controlled diabetic range): 5.4 % (ref 0.0–7.0)
HbA1c, POC (prediabetic range): 5.4 % — AB (ref 5.7–6.4)
Hemoglobin A1C: 5.4 % (ref 4.0–5.6)

## 2021-12-30 MED ORDER — VALSARTAN 160 MG PO TABS: 160.0000 mg | ORAL_TABLET | Freq: Every day | ORAL | 1 refills | Status: DC

## 2021-12-30 NOTE — Progress Notes (Signed)
[name], female  DOB: 10-29-1955, 66 y.o.   MRN: [id number] Patient Care Team    Relationship Specialty Notifications Start End  Ma Hillock, DO PCP - General Family Medicine  04/23/15   Volanda Napoleon, MD Consulting Physician Oncology  05/12/13   Chesley Mires, MD Consulting Physician Pulmonary Disease  02/22/16   Carolan Clines, MD (Inactive) Consulting Physician Urology  04/21/16   Bartholomew Crews, PA-C  Dermatology  07/04/16    Comment: [facility], Olevia Bowens, DO Consulting Physician Sports Medicine  05/17/18   Plonk, Noni Saupe, PA-C  Physician Assistant  05/25/18   Marylynn Pearson, MD Consulting Physician Obstetrics and Gynecology  12/30/21     Chief Complaint  Patient presents with   Hypertension    Cmc; pt is not fasting    Subjective: [name] is a 66 y.o.  Female  present for [facility] All past medical history, surgical history, allergies, family history, immunizations, medications and social history were updated in the electronic medical record today. All recent labs, ED visits and hospitalizations within the last year were reviewed.  Primary hypertension/family history of heart disease Pt reports compliance with valsartan 80 (1.5 tabs). Blood pressures ranges at home not routinely checked.  Patient denies chest pain, shortness of breath, dizziness or lower extremity edema. ..  Pt takes a  daily baby ASA. Pt is not prescribed statin. RF: htn, smoker, fhx heart disease.   Elevated A1c: Last a1c in March was 6.2.    Osteoporosis without current pathological fracture, unspecified osteoporosis type/Current every day smoker Patient last DEXA 08/2020 with osteoporosis.  She has been on Fosamax weekly dosing and is tolerating.  She has been encouraged to stop smoking, continues to smoke daily.  She is supplementing her vitamin D supplementation.   COPD /pulmonary nodules/lesions, multiple/Abnormal CT scan of  lung Follows with pulm, has had multiple recent flares.      07/15/2021    8:47 AM 06/12/2021    2:36 PM 06/27/2020    3:18 PM 11/21/2019    9:42 AM 10/28/2017   10:35 AM  Depression screen PHQ 2/9  Decreased Interest 0 0 0 0 0  Down, Depressed, Hopeless 0 0 0 0 0  PHQ - 2 Score 0 0 0 0 0       No data to display            Immunization History  Administered Date(s) Administered   Influenza Inj Mdck Quad Pf 02/28/2020   Influenza Split 04/09/2011   Influenza Whole 02/02/2009   Influenza,inj,Quad PF,6+ Mos 01/26/2014, 02/06/2017, 03/04/2018, 01/26/2019, 01/14/2021   Influenza-Unspecified 02/02/2013, 02/24/2015, 04/03/2016   Moderna Sars-Covid-2 Vaccination 07/19/2019, 08/16/2019   PNEUMOCOCCAL CONJUGATE-20 07/15/2021   Pneumococcal Polysaccharide-23 01/26/2019   Zoster Recombinat (Shingrix) 03/04/2018, 01/14/2021    Past Medical History:  Diagnosis Date   Acute bursitis of right shoulder 09/16/2017   Injected in November 13, 2017   Acute lateral meniscal tear 09/09/2017   Arthritis    Avulsion fracture of lateral malleolus of right fibula 07/20/2018   Basal cell carcinoma    COPD (chronic obstructive pulmonary disease) ([place])    DVT (deep venous thrombosis) ([place])    Essential hypertension 07/27/2020   History of acute bronchitis 03/24/2011   Kidney stones    Lymphadenopathy, abdominal 03/2013   Noted on abd/pelv CT done to eval slow-to-resolve pyelo   Meniere disorder 01/23/2012   Osteoporosis 12/2014  Dr. Nori Riis, her GYN, dx'd her   Pneumonia    Pyelonephritis 10/28/2017   Recurrent pyelonephritis    Seasonal allergic rhinitis    Squamous cell carcinoma in situ (SCCIS) of skin of lower leg 05/04/2018   Dr. Janan Ridge, [facility]   Squamous cell carcinoma in situ (SCCIS) of skin of lower leg 05/04/2018   Dr. Janan Ridge, [facility]  Invasive Squamous cell carcinoma margins of excision appear free of tumor in the sections examined.  Left Lateral  Leg: Superficially invasive squamous cell carcinoma, extending to the deep margin.    No Known Allergies Past Surgical History:  Procedure Laterality Date   DEXA  12/2014   T score spine -2.4, hip -3.5 (Dr. Nori Riis, [facility].   DILATION AND CURETTAGE OF UTERUS  2009   SKIN LESION EXCISION Left 05/24/2018   SCC; Skin, Excision, Left La[place] leg: postsurgical scar, no neoplasm is identified.  Skin, Shave bx and ED&C, right anterior thigh-Superficially invasive squamous cell carcinoma, extending to the deep margin.   Family History  Problem Relation Age of Onset   Hypertension Mother    Glaucoma Mother    Irritable bowel syndrome Mother    Arthritis Mother    Kidney disease Mother    Diabetes Mother    Hypertension Father    Heart disease Father        bypasses   Cancer Father        sarcoma   Colon cancer Paternal Aunt 5   Heart disease Maternal Grandmother        CHF   Stroke Maternal Grandmother        in his 44's   Breast cancer Maternal Grandmother    Stroke Maternal Grandfather    Other Paternal Grandmother        hardening of the arteries   Heart attack Paternal Grandfather    Stomach cancer Neg Hx    Social History   Social History Narrative   Not on file    Allergies as of 12/30/2021   No Known Allergies      Medication List        Accurate as of December 30, 2021  9:17 AM. If you have any questions, ask your nurse or doctor.          STOP taking these medications    predniSONE 20 MG tablet Commonly known as: DELTASONE Stopped by: Howard Pouch, DO       TAKE these medications    AeroChamber MV inhaler Use as instructed   albuterol (2.5 MG/3ML) 0.083% nebulizer solution Commonly known as: PROVENTIL Take 3 mLs (2.5 mg total) by nebulization every 6 (six) hours as needed for wheezing or shortness of breath.   albuterol 108 (90 Base) MCG/ACT inhaler Commonly known as: VENTOLIN HFA INHALE 1-2 PUFFS INTO THE LUNGS EVERY 4  (FOUR) HOURS AS NEEDED FOR WHEEZING OR SHORTNESS OF BREATH.   alendronate 70 MG tablet Commonly known as: FOSAMAX TAKE 1 TABLET BY MOUTH EVERY 7 (SEVEN) DAYS. TAKE WITH A FULL GLASS OF WATER ON AN EMPTY STOMACH.   azelastine 0.1 % nasal spray Commonly known as: ASTELIN Place 1 spray into both nostrils 2 (two) times daily. Use in each nostril as directed   Flutter Devi Twice a day and prn as needed, may increase if feeling worse   ipratropium 0.06 % nasal spray Commonly known as: ATROVENT Place 2 sprays into both nostrils 4 (four) times daily.   meclizine  25 MG tablet Commonly known as: ANTIVERT Take 1 tablet (25 mg total) by mouth 2 (two) times daily as needed for dizziness.   montelukast 10 MG tablet Commonly known as: SINGULAIR TAKE 1 TABLET BY MOUTH EVERYDAY AT BEDTIME   multivitamin tablet Take 1 tablet by mouth daily.   Trelegy Ellipta 100-62.5-25 MCG/ACT Aepb Generic drug: Fluticasone-Umeclidin-Vilant INHALE 1 PUFF BY MOUTH EVERY DAY   tretinoin 0.05 % cream Commonly known as: RETIN-A APPLY TO AFFECTED AREA EVERY DAY AT BEDTIME **PA SENT**   valsartan 160 MG tablet Commonly known as: DIOVAN Take 1 tablet (160 mg total) by mouth daily.   VITAMIN D (CHOLECALCIFEROL) PO Take 5,000 Units by mouth daily.        All past medical history, surgical history, allergies, family history, immunizations andmedications were updated in the EMR today and reviewed under the history and medication portions of their EMR.     Recent Results (from the past 2160 hour(s))  POC COVID-19 BinaxNow     Status: Normal   Collection Time: 10/28/21  4:19 PM  Result Value Ref Range   SARS Coronavirus 2 Ag Negative Negative  POCT HgB A1C     Status: Abnormal   Collection Time: 12/30/21  9:05 AM  Result Value Ref Range   Hemoglobin A1C 5.4 4.0 - 5.6 %   HbA1c POC (<> result, manual entry) 5.4 4.0 - 5.6 %   HbA1c, POC (prediabetic range) 5.4 (A) 5.7 - 6.4 %   HbA1c, POC (controlled  diabetic range) 5.4 0.0 - 7.0 %    CT CHEST WO CONTRAST Result Date: 07/01/2021 IMPRESSION: 1. No evidence for acute pneumonia. 2. Similar appearance of peribronchovascular nodularity and mucoid impaction, most prominent in the right middle lobe, lingula and both lower lobes. Findings are likely post infectious in etiology. Chronic atypical indolent process such as MAI is not excluded. 3. Stable appearance of previously noted lung nodules including the dominant nodule in the medial aspect of the right upper lobe near the apex measuring 0.8 cm on today's exam. 4. Aortic Atherosclerosis (ICD10-I70.0) and Emphysema (ICD10-J43.9). Electronically Signed   By: Kerby Moors M.D.   On: 07/01/2021 14:50    ROS 14 pt review of systems performed and negative (unless mentioned in an HPI)  Objective: BP 124/79   Pulse 71   Temp 98.1 F (36.7 C) (Oral)   Ht '5\' 4"'$  (1.626 m)   Wt 108 lb (49 kg)   SpO2 95%   BMI 18.54 kg/m  Physical Exam Vitals and nursing note reviewed.  Constitutional:      General: She is not in acute distress.    Appearance: Normal appearance. She is not ill-appearing or toxic-appearing.     Comments: thin  HENT:     Head: Normocephalic and atraumatic.     Nose: No congestion or rhinorrhea.  Eyes:     General: No scleral icterus.       Right eye: No discharge.        Left eye: No discharge.     Extraocular Movements: Extraocular movements intact.     Conjunctiva/sclera: Conjunctivae normal.     Pupils: Pupils are equal, round, and reactive to light.  Cardiovascular:     Rate and Rhythm: Normal rate and regular rhythm.     Pulses: Normal pulses.     Heart sounds: Normal heart sounds. No murmur heard. Pulmonary:     Effort: Pulmonary effort is normal. No respiratory distress.     Breath sounds: No  stridor. Rhonchi present. No wheezing or rales.  Chest:     Chest wall: No tenderness.  Musculoskeletal:     Right lower leg: No edema.     Left lower leg: No edema.   Skin:    Findings: No rash.  Neurological:     General: No focal deficit present.     Mental Status: She is alert and oriented to person, place, and time. Mental status is at baseline.     Cranial Nerves: No cranial nerve deficit.     Sensory: No sensory deficit.     Motor: No weakness.     Coordination: Coordination normal.     Gait: Gait normal.     Deep Tendon Reflexes: Reflexes normal.  Psychiatric:        Mood and Affect: Mood normal.        Behavior: Behavior normal.        Thought Content: Thought content normal.        Judgment: Judgment normal.      Results for orders placed or performed in visit on 12/30/21 (from the past 48 hour(s))  POCT HgB A1C     Status: Abnormal   Collection Time: 12/30/21  9:05 AM  Result Value Ref Range   Hemoglobin A1C 5.4 4.0 - 5.6 %   HbA1c POC (<> result, manual entry) 5.4 4.0 - 5.6 %   HbA1c, POC (prediabetic range) 5.4 (A) 5.7 - 6.4 %   HbA1c, POC (controlled diabetic range) 5.4 0.0 - 7.0 %     Assessment/plan: MALAYSHIA ALL is a 66 y.o. female present for [facility] Primary hypertension/family history of heart disease Stable Continue valsartan 160 mg tQD Goal blood pressure:<130/80 - consider cardiac CT Labs utd  Osteoporosis without current pathological fracture, unspecified osteoporosis type/Current every day smoker - continue  fosamax weekly - encouraged to stop smoking.  - Vitamin D collected today - dexa due 14/2024 (last -3.6)   COPD/pulmonary nodules/lesions, multiple/Abnormal CT scan of lung Follows with pulm. Continue follow-up with pulm for management of COPD/inhalers. Encouraged her to cut back on smoking and consider quitting.   Elevated hemoglobin: She has had chronic elevated hemoglobin dating back as far as 2015, that has steadily increased over the last few years.  She had declined referral to hematology in March 2022 for this condition. Decreasing tobacco use will help decrease hemoglobin. Encouraged  hydration. Discussed possible benefits of daily baby aspirin. Current every day smoker Encourage to quit  Elevated a1c: A1c improved today to 5.4   Return in about 24 weeks (around 06/16/2022) for Routine chronic condition follow-up.    Orders Placed This Encounter  Procedures   POCT HgB A1C   Meds ordered this encounter  Medications   valsartan (DIOVAN) 160 MG tablet    Sig: Take 1 tablet (160 mg total) by mouth daily.    Dispense:  90 tablet    Refill:  1   Referral Orders  No referral(s) requested today     Electronically signed by: Howard Pouch, Kay

## 2021-12-30 NOTE — Patient Instructions (Addendum)
Return in about 24 weeks (around 06/16/2022) for Routine chronic condition follow-up.        Great to see you today.  I have refilled the medication(s) we provide.   If labs were collected, we will inform you of lab results once received either by echart message or telephone call.   - echart message- for normal results that have been seen by the patient already.   - telephone call: abnormal results or if patient has not viewed results in their echart.

## 2022-01-11 ENCOUNTER — Other Ambulatory Visit: Payer: Self-pay | Admitting: Primary Care

## 2022-01-13 ENCOUNTER — Telehealth: Payer: Self-pay | Admitting: Family Medicine

## 2022-01-13 NOTE — Telephone Encounter (Signed)
Left message to schedule Medicare well visit.

## 2022-01-21 ENCOUNTER — Telehealth: Payer: Self-pay | Admitting: Family Medicine

## 2022-01-21 NOTE — Telephone Encounter (Signed)
LM to get patient scheduled for Medicare well visit.

## 2022-01-22 ENCOUNTER — Other Ambulatory Visit: Payer: Self-pay | Admitting: Family Medicine

## 2022-02-13 ENCOUNTER — Telehealth: Payer: Medicare Other | Admitting: Physician Assistant

## 2022-02-13 DIAGNOSIS — J411 Mucopurulent chronic bronchitis: Secondary | ICD-10-CM | POA: Diagnosis not present

## 2022-02-13 MED ORDER — LEVOFLOXACIN 500 MG PO TABS: 500.0000 mg | ORAL_TABLET | Freq: Every day | ORAL | 0 refills | Status: AC

## 2022-02-13 MED ORDER — PREDNISONE 20 MG PO TABS: ORAL_TABLET | ORAL | 0 refills | Status: DC

## 2022-02-13 NOTE — Progress Notes (Signed)
Virtual Visit Consent   [name], you are scheduled for a virtual visit with a [facility] provider today. Just as with appointments in the office, your consent must be obtained to participate. Your consent will be active for this visit and any virtual visit you may have with one of our providers in the next 365 days. If you have a MyChart account, a copy of this consent can be sent to you electronically.  As this is a virtual visit, video technology does not allow for your provider to perform a traditional examination. This may limit your provider's ability to fully assess your condition. If your provider identifies any concerns that need to be evaluated in person or the need to arrange testing (such as labs, EKG, etc.), we will make arrangements to do so. Although advances in technology are sophisticated, we cannot ensure that it will always work on either your end or our end. If the connection with a video visit is poor, the visit may have to be switched to a telephone visit. With either a video or telephone visit, we are not always able to ensure that we have a secure connection.  By engaging in this virtual visit, you consent to the provision of healthcare and authorize for your insurance to be billed (if applicable) for the services provided during this visit. Depending on your insurance coverage, you may receive a charge related to this service.  I need to obtain your verbal consent now. Are you willing to proceed with your visit today? [name] has provided verbal consent on 02/13/2022 for a virtual visit (video or telephone). Mar Daring, PA-C  Date: 02/13/2022 9:45 AM  Virtual Visit via Video Note   I, Mar Daring, connected with  [name]  ([id number], [birth year]) on 02/13/22 at  9:45 AM EDT by a video-enabled telemedicine application and verified that I am speaking with the correct person using two identifiers.  Location: Patient: Virtual Visit  Location Patient: Home Provider: Virtual Visit Location Provider: Home Office   I discussed the limitations of evaluation and management by telemedicine and the availability of in person appointments. The patient expressed understanding and agreed to proceed.    History of Present Illness: [name] is a 66 y.o. who identifies as a female who was assigned female at birth, and is being seen today for possible pneumonia.  HPI: URI  This is a new problem. The current episode started 1 to 4 weeks ago. The problem has been gradually worsening. The maximum temperature recorded prior to her arrival was 101 - 101.9 F (started yesterday). The fever has been present for 1 to 2 days. Associated symptoms include chest pain (behind right breast), congestion, coughing, headaches, sinus pain and wheezing (chronic). She has tried NSAIDs and acetaminophen (dayquil, mucinex, sudafed, afrin, nebulizer treatments, daily inhalers) for the symptoms. The treatment provided no relief.  Has COPD and history of pneumonia. Reports feels similar. Did call PCP office but she is out of town.    Problems:  Patient Active Problem List   Diagnosis Date Noted   Trigger point of left shoulder region 06/12/2021   Polycythemia 07/30/2020   Essential hypertension 07/27/2020   Family history of heart disease 07/27/2020   Nocturnal hypoxemia due to emphysema ([place]) 08/10/2018   Nonallopathic lesion of lumbosacral region 01/12/2018   Nonallopathic lesion of sacral region 01/12/2018   Nonallopathic lesion of thoracic region 01/12/2018   Nonallopathic lesion of rib cage 01/12/2018  Nonallopathic lesion of cervical region 01/12/2018   Osteoporosis without current pathological fracture 10/28/2017   Pulmonary nodules/lesions, multiple 04/23/2016   Mediastinal adenopathy 02/22/2016   Current every day smoker 02/22/2016   Hematuria 06/07/2015   Meniere disorder 01/23/2012   COPD (chronic obstructive pulmonary disease) ([place])  12/04/2008    Allergies: No Known Allergies Medications:  Current Outpatient Medications:    levofloxacin (LEVAQUIN) 500 MG tablet, Take 1 tablet (500 mg total) by mouth daily for 7 days., Disp: 7 tablet, Rfl: 0   predniSONE (DELTASONE) 20 MG tablet, Take '60mg'$  x 3 days, then '40mg'$  x 3 days, '20mg'$  x 2 days, then '10mg'$  x 2 days, Disp: 18 tablet, Rfl: 0   albuterol (PROVENTIL) (2.5 MG/3ML) 0.083% nebulizer solution, Take 3 mLs (2.5 mg total) by nebulization every 6 (six) hours as needed for wheezing or shortness of breath., Disp: 75 mL, Rfl: 1   albuterol (VENTOLIN HFA) 108 (90 Base) MCG/ACT inhaler, INHALE 1-2 PUFFS INTO THE LUNGS EVERY 4 HOURS AS NEEDED FOR WHEEZING OR SHORTNESS OF BREATH., Disp: 6.7 each, Rfl: 0   alendronate (FOSAMAX) 70 MG tablet, TAKE 1 TABLET BY MOUTH EVERY 7 (SEVEN) DAYS. TAKE WITH A FULL GLASS OF WATER ON AN EMPTY STOMACH., Disp: 4 tablet, Rfl: 11   azelastine (ASTELIN) 0.1 % nasal spray, Place 1 spray into both nostrils 2 (two) times daily. Use in each nostril as directed, Disp: 30 mL, Rfl: 11   ipratropium (ATROVENT) 0.06 % nasal spray, Place 2 sprays into both nostrils 4 (four) times daily., Disp: 15 mL, Rfl: 12   meclizine (ANTIVERT) 25 MG tablet, Take 1 tablet (25 mg total) by mouth 2 (two) times daily as needed for dizziness., Disp: 30 tablet, Rfl: 5   montelukast (SINGULAIR) 10 MG tablet, TAKE 1 TABLET BY MOUTH EVERYDAY AT BEDTIME, Disp: 90 tablet, Rfl: 3   Multiple Vitamin (MULTIVITAMIN) tablet, Take 1 tablet by mouth daily., Disp: , Rfl:    Respiratory Therapy Supplies (FLUTTER) DEVI, Twice a day and prn as needed, may increase if feeling worse, Disp: 1 each, Rfl: 0   Spacer/Aero-Holding Chambers (AEROCHAMBER MV) inhaler, Use as instructed, Disp: 1 each, Rfl: 2   TRELEGY ELLIPTA 100-62.5-25 MCG/ACT AEPB, INHALE 1 PUFF BY MOUTH EVERY DAY, Disp: 120 each, Rfl: 2   tretinoin (RETIN-A) 0.05 % cream, APPLY TO AFFECTED AREA EVERY DAY AT BEDTIME **PA SENT**, Disp: , Rfl:     valsartan (DIOVAN) 160 MG tablet, Take 1 tablet (160 mg total) by mouth daily., Disp: 90 tablet, Rfl: 1   VITAMIN D, CHOLECALCIFEROL, PO, Take 5,000 Units by mouth daily. , Disp: , Rfl:   Observations/Objective: Patient is well-developed, well-nourished in no acute distress.  Resting comfortably at home.  Head is normocephalic, atraumatic.  No labored breathing.  Speech is clear and coherent with logical content.  Patient is alert and oriented at baseline.    Assessment and Plan: 1. Mucopurulent chronic bronchitis (HCC) - levofloxacin (LEVAQUIN) 500 MG tablet; Take 1 tablet (500 mg total) by mouth daily for 7 days.  Dispense: 7 tablet; Refill: 0 - predniSONE (DELTASONE) 20 MG tablet; Take '60mg'$  x 3 days, then '40mg'$  x 3 days, '20mg'$  x 2 days, then '10mg'$  x 2 days  Dispense: 18 tablet; Refill: 0  - Worsening over a week despite OTC medications - Will treat with Levofloxacin and Prednisone - Continue inhalers and nebulizer treatments as prescribed and needed - Can continue Mucinex  - Push fluids.  - Rest.  - Steam and humidifier  can help - Seek in person evaluation if worsening or symptoms fail to improve    Follow Up Instructions: I discussed the assessment and treatment plan with the patient. The patient was provided an opportunity to ask questions and all were answered. The patient agreed with the plan and demonstrated an understanding of the instructions.  A copy of instructions were sent to the patient via MyChart unless otherwise noted below.    The patient was advised to call back or seek an in-person evaluation if the symptoms worsen or if the condition fails to improve as anticipated.  Time:  I spent 10 minutes with the patient via telehealth technology discussing the above problems/concerns.    Mar Daring, PA-C

## 2022-02-13 NOTE — Patient Instructions (Signed)
[name], thank you for joining Mar Daring, PA-C for today's virtual visit.  While this provider is not your primary care provider (PCP), if your PCP is located in our provider database this encounter information will be shared with them immediately following your visit.  Consent: (Patient) [name] provided verbal consent for this virtual visit at the beginning of the encounter.  Current Medications:  Current Outpatient Medications:    levofloxacin (LEVAQUIN) 500 MG tablet, Take 1 tablet (500 mg total) by mouth daily for 7 days., Disp: 7 tablet, Rfl: 0   predniSONE (DELTASONE) 20 MG tablet, Take '60mg'$  x 3 days, then '40mg'$  x 3 days, '20mg'$  x 2 days, then '10mg'$  x 2 days, Disp: 18 tablet, Rfl: 0   albuterol (PROVENTIL) (2.5 MG/3ML) 0.083% nebulizer solution, Take 3 mLs (2.5 mg total) by nebulization every 6 (six) hours as needed for wheezing or shortness of breath., Disp: 75 mL, Rfl: 1   albuterol (VENTOLIN HFA) 108 (90 Base) MCG/ACT inhaler, INHALE 1-2 PUFFS INTO THE LUNGS EVERY 4 HOURS AS NEEDED FOR WHEEZING OR SHORTNESS OF BREATH., Disp: 6.7 each, Rfl: 0   alendronate (FOSAMAX) 70 MG tablet, TAKE 1 TABLET BY MOUTH EVERY 7 (SEVEN) DAYS. TAKE WITH A FULL GLASS OF WATER ON AN EMPTY STOMACH., Disp: 4 tablet, Rfl: 11   azelastine (ASTELIN) 0.1 % nasal spray, Place 1 spray into both nostrils 2 (two) times daily. Use in each nostril as directed, Disp: 30 mL, Rfl: 11   ipratropium (ATROVENT) 0.06 % nasal spray, Place 2 sprays into both nostrils 4 (four) times daily., Disp: 15 mL, Rfl: 12   meclizine (ANTIVERT) 25 MG tablet, Take 1 tablet (25 mg total) by mouth 2 (two) times daily as needed for dizziness., Disp: 30 tablet, Rfl: 5   montelukast (SINGULAIR) 10 MG tablet, TAKE 1 TABLET BY MOUTH EVERYDAY AT BEDTIME, Disp: 90 tablet, Rfl: 3   Multiple Vitamin (MULTIVITAMIN) tablet, Take 1 tablet by mouth daily., Disp: , Rfl:    Respiratory Therapy Supplies (FLUTTER) DEVI, Twice a day and prn  as needed, may increase if feeling worse, Disp: 1 each, Rfl: 0   Spacer/Aero-Holding Chambers (AEROCHAMBER MV) inhaler, Use as instructed, Disp: 1 each, Rfl: 2   TRELEGY ELLIPTA 100-62.5-25 MCG/ACT AEPB, INHALE 1 PUFF BY MOUTH EVERY DAY, Disp: 120 each, Rfl: 2   tretinoin (RETIN-A) 0.05 % cream, APPLY TO AFFECTED AREA EVERY DAY AT BEDTIME **PA SENT**, Disp: , Rfl:    valsartan (DIOVAN) 160 MG tablet, Take 1 tablet (160 mg total) by mouth daily., Disp: 90 tablet, Rfl: 1   VITAMIN D, CHOLECALCIFEROL, PO, Take 5,000 Units by mouth daily. , Disp: , Rfl:    Medications ordered in this encounter:  Meds ordered this encounter  Medications   levofloxacin (LEVAQUIN) 500 MG tablet    Sig: Take 1 tablet (500 mg total) by mouth daily for 7 days.    Dispense:  7 tablet    Refill:  0    Order Specific Question:   Supervising Provider    Answer:   Chase Picket [[id number]]   predniSONE (DELTASONE) 20 MG tablet    Sig: Take '60mg'$  x 3 days, then '40mg'$  x 3 days, '20mg'$  x 2 days, then '10mg'$  x 2 days    Dispense:  18 tablet    Refill:  0    Order Specific Question:   Supervising Provider    AnswerChase Picket [id number]     *If you  need refills on other medications prior to your next appointment, please contact your pharmacy*  Follow-Up: Call back or seek an in-person evaluation if the symptoms worsen or if the condition fails to improve as anticipated.  [place] [phone]  Other Instructions  Community-Acquired Pneumonia, Adult Pneumonia is an infection of the lungs. It causes irritation and swelling in the airways of the lungs. Mucus and fluid may also build up inside the airways. This may cause coughing and trouble breathing. One type of pneumonia can happen while you are in a hospital. A different type can happen when you are not in a hospital (community-acquired pneumonia). What are the causes?  This condition is caused by germs (viruses, bacteria, or fungi). Some  types of germs can spread from person to person. Pneumonia is not thought to spread from person to person. What increases the risk? You have a long-term (chronic) disease, such as: Disease of the lungs. This may be chronic obstructive pulmonary disease (COPD) or asthma. Heart failure. Cystic fibrosis. Diabetes. Kidney disease. Sickle cell disease. HIV. You have other health problems, such as: Your body's defense system (immune system) is weak. A condition that may cause you to breathe in fluids from your mouth and nose. You had your spleen taken out. You do not take good care of your teeth and mouth (poor dental hygiene). You use or have used tobacco products. You go where the germs that cause this illness are common. You are older than 66 years of age. What are the signs or symptoms? A cough. A fever. Sweating or chills. Chest pain, often when you breathe deeply or cough. Breathing problems, such as: Fast breathing. Trouble breathing. Shortness of breath. Feeling tired (fatigued). Muscle aches. How is this treated? Treatment for this condition depends on many things, such as: The cause of your illness. Your medicines. Your other health problems. Most adults can be treated at home. Sometimes, treatment must happen in a hospital. Treatment may include medicines to kill germs. Medicines may depend on which germ caused your illness. Very bad pneumonia is rare. If you get it, you may: Have a machine to help you breathe. Have fluid taken away from around your lungs. Follow these instructions at home: Medicines Take over-the-counter and prescription medicines only as told by your doctor. Take cough medicine only if you are losing sleep. Cough medicine can keep your body from taking mucus away from your lungs. If you were prescribed antibiotics, take them as told by your doctor. Do not stop taking them even if you start to feel better. Lifestyle     Do not smoke or use any  products that contain nicotine or tobacco. If you need help quitting, ask your doctor. Do not drink alcohol. Eat a healthy diet. This includes a lot of vegetables, fruits, whole grains, low-fat dairy products, and low-fat (lean) protein. General instructions  Rest a lot. Sleep for at least 8 hours each night. Sleep with your head and neck raised. Put a few pillows under your head or sleep in a reclining chair. Return to your normal activities as told by your doctor. Ask your doctor what activities are safe for you. Drink enough fluid to keep your pee (urine) pale yellow. If your throat is sore, gargle with a mixture of salt and water 3-4 times a day or as needed. To make salt water, completely dissolve -1 tsp (3-6 g) of salt in 1 cup (237 mL) of warm water. Keep all follow-up visits. How  is this prevented? Getting the pneumonia shot (vaccine). These shots have different types and schedules. Ask your doctor what works best for you. Think about getting this shot if: You are older than 66 years of age. You are 57-57 years of age and: You are being treated for cancer. You have long-term lung disease. You have other problems that affect your body's defense system. Ask your doctor if you have one of these. Getting your flu shot every year. Ask your doctor which type of shot is best for you. Going to the dentist as often as told. Washing your hands often with soap and water for at least 20 seconds. If you cannot use soap and water, use hand sanitizer. Contact a doctor if: You have a fever. You lose sleep because your cough medicine does not help. Get help right away if: You are short of breath and this gets worse. You have more chest pain. Your sickness gets worse. This is very serious if: You are an older adult. Your body's defense system is weak. You cough up blood. These symptoms may be an emergency. Get help right away. Call 911. Do not wait to see if the symptoms will go away. Do not  drive yourself to the hospital. Summary Pneumonia is an infection of the lungs. Community-acquired pneumonia affects people who have not been in the hospital. Certain germs can cause this infection. This condition may be treated with medicines that kill germs. For very bad pneumonia, you may need a hospital stay and treatment to help with breathing. This information is not intended to replace advice given to you by your health care provider. Make sure you discuss any questions you have with your health care provider. Document Revised: 06/19/2021 Document Reviewed: 06/19/2021 Elsevier Patient Education  [place].    If you have been instructed to have an in-person evaluation today at a local Urgent Care facility, please use the link below. It will take you to a list of all of our available [facility], including address, phone number and hours of operation. Please do not delay care.  [facility]  If you or a family member do not have a primary care provider, use the link below to schedule a visit and establish care. When you choose a [facility] primary care physician or advanced practice provider, you gain a long-term partner in health. Find a Primary Care Provider  Learn more about [facility]'s in-office and virtual care options: [facility]

## 2022-02-17 ENCOUNTER — Other Ambulatory Visit: Payer: Self-pay | Admitting: Family Medicine

## 2022-02-21 ENCOUNTER — Other Ambulatory Visit: Payer: Self-pay

## 2022-02-21 MED ORDER — AZELASTINE HCL 137 MCG/SPRAY NA SOLN: NASAL | 1 refills | Status: DC

## 2022-02-25 ENCOUNTER — Other Ambulatory Visit: Payer: Self-pay

## 2022-03-03 DIAGNOSIS — M810 Age-related osteoporosis without current pathological fracture: Secondary | ICD-10-CM | POA: Diagnosis not present

## 2022-04-01 ENCOUNTER — Other Ambulatory Visit: Payer: Self-pay | Admitting: Family Medicine

## 2022-06-04 ENCOUNTER — Other Ambulatory Visit: Payer: Self-pay | Admitting: Pulmonary Disease

## 2022-06-05 DIAGNOSIS — L578 Other skin changes due to chronic exposure to nonionizing radiation: Secondary | ICD-10-CM | POA: Diagnosis not present

## 2022-06-05 DIAGNOSIS — L814 Other melanin hyperpigmentation: Secondary | ICD-10-CM | POA: Diagnosis not present

## 2022-06-05 DIAGNOSIS — L570 Actinic keratosis: Secondary | ICD-10-CM | POA: Diagnosis not present

## 2022-06-05 DIAGNOSIS — L988 Other specified disorders of the skin and subcutaneous tissue: Secondary | ICD-10-CM | POA: Diagnosis not present

## 2022-06-26 ENCOUNTER — Encounter: Payer: Self-pay | Admitting: Acute Care

## 2022-07-01 ENCOUNTER — Ambulatory Visit
Admission: RE | Admit: 2022-07-01 | Discharge: 2022-07-01 | Disposition: A | Discharge status: Home / Self Care | Payer: Medicare Other | Source: Ambulatory Visit | Attending: Family Medicine | Admitting: Family Medicine

## 2022-07-01 DIAGNOSIS — R918 Other nonspecific abnormal finding of lung field: Secondary | ICD-10-CM | POA: Diagnosis not present

## 2022-07-01 DIAGNOSIS — Z87891 Personal history of nicotine dependence: Secondary | ICD-10-CM

## 2022-07-01 DIAGNOSIS — I700 Atherosclerosis of aorta: Secondary | ICD-10-CM | POA: Diagnosis not present

## 2022-07-01 DIAGNOSIS — J432 Centrilobular emphysema: Secondary | ICD-10-CM | POA: Diagnosis not present

## 2022-07-01 DIAGNOSIS — F17210 Nicotine dependence, cigarettes, uncomplicated: Secondary | ICD-10-CM | POA: Diagnosis not present

## 2022-07-02 ENCOUNTER — Telehealth: Payer: Self-pay | Admitting: Acute Care

## 2022-07-02 NOTE — Telephone Encounter (Signed)
Tiffany calling with call report. For CT scan.Tiffany phone number is [phone].

## 2022-07-02 NOTE — Telephone Encounter (Signed)
IMPRESSION: 1. Lung-RADS 4A, suspicious. Follow up low-dose chest CT without contrast in 3 months (please use the following order, "CT CHEST [facility] NODULE FOLLOW-UP W/O CM") is recommended. Development of innumerable bilateral pulmonary nodules. Given concurrent bilateral lower lobe mucoid impaction and the appearance of the lungs on 07/01/2021 CT, these are favored to be infectious. Consider antibiotic therapy prior to CT follow-up. 2. Mild thoracic adenopathy, similar and favored to be reactive. 3. Aortic atherosclerosis (ICD10-I70.0) and emphysema (ICD10-J43.9).   These results will be called to the ordering clinician or representative by the Radiologist Assistant, and communication documented in the PACS or [employer].     Electronically Signed   By: Abigail Miyamoto M.D.   On: 07/02/2022 11:57

## 2022-07-03 ENCOUNTER — Telehealth: Payer: Self-pay

## 2022-07-03 NOTE — Telephone Encounter (Signed)
Patient calling about results - she seen results on mychart and concerned about results.  I told patient Dr. Raoul Pitch has not reviewed, out of office until tomorrow. Patient states it is recommending antibiotics??  Please call patient tomorrow. [phone]  Patient uses [facility] [place]

## 2022-07-04 NOTE — Telephone Encounter (Signed)
Spoke with patient regarding results/recommendations.  

## 2022-07-08 ENCOUNTER — Other Ambulatory Visit: Payer: Self-pay | Admitting: Family Medicine

## 2022-07-08 NOTE — Telephone Encounter (Signed)
Pt calls and states she did not speak with anyone Friday. I informed the patient that documentation states she spoke with Lorriane Shire about recommendations and results. She denies that she had a phone call and is asking for a call back to discuss results.

## 2022-07-08 NOTE — Telephone Encounter (Signed)
Previously documented in error. Spoke with patient and scheduled for 03/08 with PCP

## 2022-07-08 NOTE — Telephone Encounter (Signed)
I would encourage her to call pulmonology, they are the ones that completed the image study. We did not see her for this or order the study she is referring to.

## 2022-07-09 ENCOUNTER — Other Ambulatory Visit: Payer: Self-pay | Admitting: Acute Care

## 2022-07-09 ENCOUNTER — Telehealth: Payer: Self-pay | Admitting: Acute Care

## 2022-07-09 DIAGNOSIS — Z87891 Personal history of nicotine dependence: Secondary | ICD-10-CM

## 2022-07-09 DIAGNOSIS — R911 Solitary pulmonary nodule: Secondary | ICD-10-CM

## 2022-07-09 DIAGNOSIS — J441 Chronic obstructive pulmonary disease with (acute) exacerbation: Secondary | ICD-10-CM

## 2022-07-09 MED ORDER — DOXYCYCLINE HYCLATE 50 MG PO CAPS: 50.0000 mg | ORAL_CAPSULE | Freq: Two times a day (BID) | ORAL | 0 refills | Status: DC

## 2022-07-09 NOTE — Telephone Encounter (Signed)
Spoke with patient regarding results/recommendations. Pt would like to keep scheduled appt

## 2022-07-09 NOTE — Progress Notes (Signed)
See Telephone note from 07/10/2022

## 2022-07-09 NOTE — Telephone Encounter (Signed)
I have called the patient with the results of her low dose CT Chest. Her scan was read as a Lung RADS 4 A : suspicious findings, either short term follow up in 3 months or alternatively  PET Scan evaluation may be considered when there is a solid component of  8 mm or larger.  However, per radiology Development of innumerable bilateral pulmonary nodules. Given concurrent bilateral lower lobe mucoid impaction and the appearance of the lungs on 07/01/2021 CT, these are favored to be infectious. Consider antibiotic therapy prior to CT follow-up.  Pt. States she has been sick. She has fever, and has been sick since mid February. She is coughing up green secretions.  I have send in a prescription for Doxycycline 100 mg BID x 7 days. I have asked her to start Mucinex 1200 mg every morning with full glass of water, and  flutter valve 10 blows 4 times a day for secretion mobilization.  [name], she will need a 3 month follow up low dose Ct Chest. Can we put her on my schedule for a telephone follow up in a week to ensure she is doing better? Thanks so much

## 2022-07-09 NOTE — Telephone Encounter (Signed)
Results/ plans faxed to PCP. Order placed for 3 mth f/u CT. Pt scheduled for televisit 07/18/22 with Eric Form, NP.

## 2022-07-10 ENCOUNTER — Other Ambulatory Visit: Payer: Self-pay

## 2022-07-11 ENCOUNTER — Ambulatory Visit: Payer: Medicare Other | Admitting: Family Medicine

## 2022-07-15 ENCOUNTER — Telehealth: Payer: Self-pay

## 2022-07-15 NOTE — Telephone Encounter (Signed)
Received a message from Dr. Lucita Lora office regarding pt had inquired about televisit with S.Groce, NP for 07/18/22.  Patient was asking about the visit and what phone number we may be calling from for that encounter.  Attempt to call.  No answer.  Left VM for patient on home number with message of the office number or similar number we would call from for that visit.  Also left the [facility] phone number if she had further questions.

## 2022-07-18 ENCOUNTER — Ambulatory Visit (INDEPENDENT_AMBULATORY_CARE_PROVIDER_SITE_OTHER): Payer: Medicare Other | Admitting: Acute Care

## 2022-07-18 ENCOUNTER — Encounter: Payer: Self-pay | Admitting: Acute Care

## 2022-07-18 DIAGNOSIS — J440 Chronic obstructive pulmonary disease with acute lower respiratory infection: Secondary | ICD-10-CM

## 2022-07-18 DIAGNOSIS — I700 Atherosclerosis of aorta: Secondary | ICD-10-CM | POA: Diagnosis not present

## 2022-07-18 DIAGNOSIS — F17200 Nicotine dependence, unspecified, uncomplicated: Secondary | ICD-10-CM

## 2022-07-18 NOTE — Patient Instructions (Addendum)
It is good to talk with you today. I am glad you are feeling better after being treated with antibiotics. We will repeat your CT Chest in 3 months as planned. My Nurse will call you about 2 weeks before your scan to make sure you are doing well before we do the repeat scan.  We will notify you of the results after the scan Please continue Mucinex 1200 mg with a full glass of water daily, with Flutter valve 10 blows , four times a day to help mobilize sections.  Stop smoking Call if you need [place] sooner.  Please contact office for sooner follow up if symptoms do not improve or worsen or seek emergency care

## 2022-07-18 NOTE — Progress Notes (Addendum)
Virtual Visit via Telephone Note  I connected with [name] on 07/18/22 at  9:00 AM EDT by telephone and verified that I am speaking with the correct person using two identifiers.  Location: Patient:  At home Provider: [address], [place], [place], [address]    I discussed the limitations, risks, security and privacy concerns of performing an evaluation and management service by telephone and the availability of in person appointments. I also discussed with the patient that there may be a patient responsible charge related to this service. The patient expressed understanding and agreed to proceed.   History of Present Illness: Pt. Presenting for follow up after abnormal Low Dose Ct Chest, suspicious for infection. Per radiology>> Development of innumerable bilateral pulmonary nodules. Given concurrent bilateral lower lobe mucoid impaction and the appearance of the lungs on 07/01/2021 CT, these are favored to be infectious. Consider antibiotic therapy prior to CT follow-up.When I called her she was symptomatic for fever and green secretions. She did not endorse wheezing.  I prescribed Doxycycline 100 mg BID x 7 days, started her on Mucinex , and a flutter valve . She states she immediately felt better.    Observations/Objective: Low Dose CT Chest  07/01/2022 Cardiovascular: Aortic atherosclerosis. Normal heart size, without pericardial effusion.   Mediastinum/Nodes: Borderline pretracheal adenopathy at 1.0 cm is unchanged.   Subcarinal node of 1.3 cm on 33/2 is also similar.   Hilar regions poorly evaluated without intravenous contrast.   Lungs/Pleura: No pleural fluid. Moderate centrilobular emphysema. Right greater than left lower lobe extensive mucoid impaction.   Innumerable pulmonary nodules are new since prior screening CT and primarily new since 07/01/2021 diagnostic CT. These are relatively diffuse, but most prevalent in the areas of mucoid impaction. Many are  ill-defined with areas of adjacent ground-glass. Examples at up to volume derived equivalent diameter 7.2 mm in the left upper lobe.   Upper Abdomen: Normal imaged portions of the liver, spleen, stomach, pancreas, adrenal glands, right kidney. Favor vascular and dystrophic calcifications in the left kidney, with mild upper pole scarring.   Musculoskeletal: Mild convex right thoracic spine curvature.   IMPRESSION: 1. Lung-RADS 4A, suspicious. Follow up low-dose chest CT without contrast in 3 months (please use the following order, "CT CHEST LCS NODULE FOLLOW-UP W/O CM") is recommended. Development of innumerable bilateral pulmonary nodules. Given concurrent bilateral lower lobe mucoid impaction and the appearance of the lungs on 07/01/2021 CT, these are favored to be infectious. Consider antibiotic therapy prior to CT follow-up. 2. Mild thoracic adenopathy, similar and favored to be reactive. 3. Aortic atherosclerosis (ICD10-I70.0) and emphysema (ICD10-J43.9).    Assessment and Plan: Lower Respiratory Infection noted on LDCT Development of innumerable bilateral pulmonary nodules. Given concurrent bilateral lower lobe mucoid impaction and the appearance of the lungs on 07/01/2021 CT, these are favored to be infectious. Clinically patient was symptomatic Plan Treated with Doxycycline 100 mg BID x 7 days with improvement of symptoms Follow up CT Chest per Lung Cancer Screening Program in 3 months as planned My Nurse will call you about 2 weeks before your scan to make sure you are doing well before we do the repeat scan.  We will notify you of the results after the scan Please continue Mucinex 1200 mg with a full glass of water daily, with Flutter valve 10 blows , four times a day to help mobilize sections.  Call if you need [place] sooner.  Please contact office for sooner follow up if symptoms do  not improve or worsen or seek emergency care  Follow up after for results  Aortic  Atherosclerosis Family History of heart disease Plan Pleas ask your PCP to refer you to cardiology for evaluation  Tobacco Abuse Plan Please work on quitting smoking Call 1-800- QUIT NOW Counseled to quit smoking Be stronger than your excuses Tips to help you break the habit:             -Talk with your doctor             -Make a plan             -Start with small goals   Tools to aid in quitting smoking:             -Nicotine Replacement Therapy (NRT)             -Non-Nicotine Medications             -Support groups and resources (see below) Counseled for 3-4 minutes on above   This info is to assist you with your smoking cessation journey.  It is not a treatment plan or a substitute for medical advice from your physician.   I spent 3-4  minutes of face to face time counseling patient on smoking cessation , determining triggers that make her want to smoke, and educating the patient on the dangers of continued vaping. She  verbalized understanding that the single most powerful action she can take to decrease her risk of lung cancer is to quit smoking totally.    Resources: [employer] (646)364-9037 for more information about their mobile app, text messaging or online smoking cessation help.   [facility] 1-800-QUIT-NOW for free nicotine replacement therapy resources and helpful information to quit smoking.   [facility] For smoking cessation classes, call [phone].     Follow Up Instructions: 3 month follow up LDCT Call PCP about referral to cardiology   I discussed the assessment and treatment plan with the patient. The patient was provided an opportunity to ask questions and all were answered. The patient agreed with the plan and demonstrated an understanding of the instructions.   The patient was advised to call back or seek an in-person evaluation if the symptoms worsen or if the condition fails to improve as anticipated.  I  provided 30 minutes of non-face-to-face time during this encounter.   Bevelyn Ngo, NP

## 2022-07-23 DIAGNOSIS — Z85828 Personal history of other malignant neoplasm of skin: Secondary | ICD-10-CM | POA: Diagnosis not present

## 2022-07-23 DIAGNOSIS — C4402 Squamous cell carcinoma of skin of lip: Secondary | ICD-10-CM | POA: Diagnosis not present

## 2022-07-23 DIAGNOSIS — D3701 Neoplasm of uncertain behavior of lip: Secondary | ICD-10-CM | POA: Diagnosis not present

## 2022-07-23 DIAGNOSIS — L570 Actinic keratosis: Secondary | ICD-10-CM | POA: Diagnosis not present

## 2022-07-23 DIAGNOSIS — D225 Melanocytic nevi of trunk: Secondary | ICD-10-CM | POA: Diagnosis not present

## 2022-07-23 DIAGNOSIS — C44729 Squamous cell carcinoma of skin of left lower limb, including hip: Secondary | ICD-10-CM | POA: Diagnosis not present

## 2022-07-23 DIAGNOSIS — L814 Other melanin hyperpigmentation: Secondary | ICD-10-CM | POA: Diagnosis not present

## 2022-07-23 DIAGNOSIS — L821 Other seborrheic keratosis: Secondary | ICD-10-CM | POA: Diagnosis not present

## 2022-07-23 DIAGNOSIS — D485 Neoplasm of uncertain behavior of skin: Secondary | ICD-10-CM | POA: Diagnosis not present

## 2022-08-11 DIAGNOSIS — C44729 Squamous cell carcinoma of skin of left lower limb, including hip: Secondary | ICD-10-CM | POA: Diagnosis not present

## 2022-08-28 DIAGNOSIS — K080 Exfoliation of teeth due to systemic causes: Secondary | ICD-10-CM | POA: Diagnosis not present

## 2022-08-29 ENCOUNTER — Other Ambulatory Visit: Payer: Self-pay | Admitting: Family Medicine

## 2022-09-01 DIAGNOSIS — Z48817 Encounter for surgical aftercare following surgery on the skin and subcutaneous tissue: Secondary | ICD-10-CM | POA: Diagnosis not present

## 2022-09-10 ENCOUNTER — Telehealth: Payer: Self-pay | Admitting: Acute Care

## 2022-09-10 NOTE — Telephone Encounter (Signed)
Patient called to inform the NP that she is getting sick.  She stated she has SOB, wheezing but no fever.  She said that that Maralyn Sago wanted her to call if she stated feeling sick.  Please advise and call patient to discuss further at [phone]

## 2022-09-12 ENCOUNTER — Other Ambulatory Visit: Payer: Self-pay | Admitting: Acute Care

## 2022-09-12 DIAGNOSIS — J441 Chronic obstructive pulmonary disease with (acute) exacerbation: Secondary | ICD-10-CM

## 2022-09-12 DIAGNOSIS — J411 Mucopurulent chronic bronchitis: Secondary | ICD-10-CM

## 2022-09-12 MED ORDER — PREDNISONE 20 MG PO TABS: ORAL_TABLET | ORAL | 0 refills | Status: DC

## 2022-09-12 MED ORDER — DOXYCYCLINE HYCLATE 50 MG PO CAPS: 50.0000 mg | ORAL_CAPSULE | Freq: Two times a day (BID) | ORAL | 0 refills | Status: DC

## 2022-09-12 NOTE — Telephone Encounter (Signed)
Called and spoke with patient, provided recommendations per Maralyn Sago.  She verbalized understanding.  Nothing further needed.

## 2022-09-12 NOTE — Telephone Encounter (Signed)
Spoke with patient, she states she has been having green mucous, cough/congestion and wheezing for 2 weeks.  She states she may have had a low grade fever, however has not taken her temperature.  Denies any chills or body aches.  She is using her Trelegy daily.  She usually does not have to use her Albuterol inhaler or nebulizer, however she had had to use the inhaler 3-4/day and had used the nebulizer twice.  She did not get relief from the inhaler like she used to, she got better relief from the albuterol nebulizer treatment.  She has a decreased appetite, but she is eating and drinking.  Advised I would get a message to Maralyn Sago and once we hear back from her we will call her back with her recommendations.  She states she had to take her mother for an echocardiogram and will not be back until 1-1:30 pm today.  She states it is ok to leave a detailed message.  Sarah, please advise.  Thank you   No Known Allergies   Assessment and Plan: Lower Respiratory Infection noted on LDCT Development of innumerable bilateral pulmonary nodules. Given concurrent bilateral lower lobe mucoid impaction and the appearance of the lungs on 07/01/2021 CT, these are favored to be infectious. Clinically patient was symptomatic Plan Treated with Doxycycline 100 mg BID x 7 days with improvement of symptoms Follow up CT Chest per Lung Cancer Screening Program in 3 months as planned My Nurse will call you about 2 weeks before your scan to make sure you are doing well before we do the repeat scan.  We will notify you of the results after the scan Please continue Mucinex 1200 mg with a full glass of water daily, with Flutter valve 10 blows , four times a day to help mobilize sections.  Call if you need [place] sooner.  Please contact office for sooner follow up if symptoms do not improve or worsen or seek emergency care  Follow up after for results   Aortic Atherosclerosis Family History of heart disease Plan Pleas ask your  PCP to refer you to cardiology for evaluation   Tobacco Abuse Plan Please work on quitting smoking Call 1-800- QUIT NOW     Follow Up Instructions: 3 month follow up LDCT Call PCP about referral to cardiology   I discussed the assessment and treatment plan with the patient. The patient was provided an opportunity to ask questions and all were answered. The patient agreed with the plan and demonstrated an understanding of the instructions.   The patient was advised to call back or seek an in-person evaluation if the symptoms worsen or if the condition fails to improve as anticipated.   I provided 30 minutes of non-face-to-face time during this encounter.     Bevelyn Ngo, NP       Patient Instructions by Bevelyn Ngo, NP at 07/18/2022 9:00 AM  Author: Bevelyn Ngo, NP Author Type: Nurse Practitioner Filed: 07/18/2022  9:36 AM  Note Status: Addendum Cosign: Cosign Not Required Encounter Date: 07/18/2022  Editor: Bevelyn Ngo, NP (Nurse Practitioner)      Prior Versions: 1. Bevelyn Ngo, NP (Nurse Practitioner) at 07/18/2022  9:29 AM - Addendum   2. Bevelyn Ngo, NP (Nurse Practitioner) at 07/18/2022  9:26 AM - Signed  It is good to talk with you today. I am glad you are feeling better after being treated with antibiotics. We will repeat your CT Chest in 3 months as  planned. My Nurse will call you about 2 weeks before your scan to make sure you are doing well before we do the repeat scan.  We will notify you of the results after the scan Please continue Mucinex 1200 mg with a full glass of water daily, with Flutter valve 10 blows , four times a day to help mobilize sections.  Stop smoking Call if you need [place] sooner.  Please contact office for sooner follow up if symptoms do not improve or worsen or seek emergency care           Instructions  It is good to talk with you today. I am glad you are feeling better after being treated with antibiotics. We will repeat your  CT Chest in 3 months as planned. My Nurse will call you about 2 weeks before your scan to make sure you are doing well before we do the repeat scan.  We will notify you of the results after the scan Please continue Mucinex 1200 mg with a full glass of water daily, with Flutter valve 10 blows , four times a day to help mobilize sections.  Stop smoking Call if you need [place] sooner.  Please contact office for sooner follow up if symptoms do not improve or worsen or seek emergency care

## 2022-09-12 NOTE — Telephone Encounter (Signed)
ATC LMVTCB x 1  

## 2022-09-12 NOTE — Telephone Encounter (Signed)
Pt. Still sick and needs advise

## 2022-09-24 DIAGNOSIS — D485 Neoplasm of uncertain behavior of skin: Secondary | ICD-10-CM | POA: Diagnosis not present

## 2022-09-24 DIAGNOSIS — L570 Actinic keratosis: Secondary | ICD-10-CM | POA: Diagnosis not present

## 2022-09-24 DIAGNOSIS — L821 Other seborrheic keratosis: Secondary | ICD-10-CM | POA: Diagnosis not present

## 2022-09-24 DIAGNOSIS — Z48817 Encounter for surgical aftercare following surgery on the skin and subcutaneous tissue: Secondary | ICD-10-CM | POA: Diagnosis not present

## 2022-09-26 ENCOUNTER — Telehealth: Payer: Self-pay | Admitting: *Deleted

## 2022-09-26 NOTE — Telephone Encounter (Signed)
Called and left a detailed message for pt to call our office (screening number) to let us know if she is still producing the green mucus. Will hold until next week.

## 2022-09-26 NOTE — Telephone Encounter (Signed)
PT has a CT scan sched and may still have impacted mucous.   MRI is the 29th. The Antibx did not work No fever but still coughing up green mucous. Please call advice.Ms.  Reece Agar had said she wanted it to be cleared by time of new testing.   [phone] is her #. Msg OK.  [facility] [place]

## 2022-09-26 NOTE — Telephone Encounter (Signed)
Called and spoke with pt who states she believes she finished the doxycycline abx about 2-3 days ago. States she is still coughing up green phlegm.  Pt denies any fever, states she has not checked her temp but states she does not feel feverish.  Pt has an occasional wheeze but states that it does not happen all the time.   Pt is currently scheduled to have the LCS Nodule CT 5/30 but she said Maralyn Sago told her that she wanted all infection to be gone prior to her having the scan to be able to see what all they were trying to look for.   Sarah, please advise on this what you recommend having happen.

## 2022-09-29 ENCOUNTER — Other Ambulatory Visit: Payer: Self-pay | Admitting: Family Medicine

## 2022-09-30 ENCOUNTER — Other Ambulatory Visit: Payer: Self-pay

## 2022-09-30 ENCOUNTER — Telehealth: Payer: Self-pay | Admitting: *Deleted

## 2022-09-30 ENCOUNTER — Telehealth: Payer: Self-pay | Admitting: Acute Care

## 2022-09-30 ENCOUNTER — Telehealth: Payer: Self-pay

## 2022-09-30 ENCOUNTER — Other Ambulatory Visit: Payer: Self-pay | Admitting: Acute Care

## 2022-09-30 DIAGNOSIS — J069 Acute upper respiratory infection, unspecified: Secondary | ICD-10-CM

## 2022-09-30 MED ORDER — AMOXICILLIN-POT CLAVULANATE 875-125 MG PO TABS
1.0000 | ORAL_TABLET | Freq: Two times a day (BID) | ORAL | 0 refills | Status: AC
Start: 2022-09-30 — End: 2022-10-07

## 2022-09-30 NOTE — Telephone Encounter (Signed)
Refer to encounter from 5/24.

## 2022-09-30 NOTE — Telephone Encounter (Signed)
[name] A    09/30/22  8:41 AM Note Pt states she is calling in because she hasn't got any better. She has head and chest congestion. She has been coughing up greenish yellow mucus.  [facility] and spoke with pt who states she is still coughing up phlegm that is yellow-green in color. States that she is coughing up phlegm that is about the size of nickel and that is very thick in consistency. States that she is having head and chest congestion. Pt is also wheezing and states that she cannot take in a really deep breath.  Pt said she also now cannot taste but states that she did do a covid test which came back negative.  States she does have a low grade temp of 99.9 and states she has chills as well as body aches. Denies any pain in back or shoulders.  States the last dose of the doxy was 5/18. Pt has used her rescue inhaler about 3 times a day recently and states she used her nebulizer once yesterday 5/27. Pt is also using her Trelegy inhaler and all other medications as prescribed.  With all that is currently going on, pt wants to know what could be recommended. Sarah, please advise on this. Also routing this to lung nodule pool as pt was asked Friday 5/24 to call screening number.

## 2022-09-30 NOTE — Telephone Encounter (Signed)
Please see encounter (signed) from 5/24. PT ret Cherina's call. Pls try again @ [phone]

## 2022-09-30 NOTE — Telephone Encounter (Signed)
A user error has taken place: encounter opened in error, closed for administrative reasons.

## 2022-09-30 NOTE — Telephone Encounter (Signed)
Called patient but she did not answer. Left message for her to call back.  

## 2022-09-30 NOTE — Telephone Encounter (Signed)
Called and spoke with patient. She verbalized understanding. I attempted to get her scheduled for a follow up appt but she wanted to discuss the follow up LCS CT she has scheduled for 05/30. She wanted to know if she needed to reschedule this CT since she has congestion. I advised her that I would double check with Maralyn Sago. She verbalized understanding.   Maralyn Sago, can you please advise about the CT scan? Thanks!

## 2022-09-30 NOTE — Telephone Encounter (Signed)
Pt states she is calling in because she hasn't got any better. She has head and chest congestion. She has been coughing up greenish yellow mucus.  [facility]

## 2022-09-30 NOTE — Telephone Encounter (Signed)
Called patient to reschedule LDCT due to recent illness. Will also need follow up visit with Saralyn Pilar, NP to review results with patient after CT is completed.  There was no answer for this call.  Left VM and call back number.

## 2022-10-01 ENCOUNTER — Other Ambulatory Visit: Payer: Medicare Other

## 2022-10-02 ENCOUNTER — Other Ambulatory Visit: Payer: Self-pay | Admitting: Family Medicine

## 2022-10-02 ENCOUNTER — Inpatient Hospital Stay: Admission: RE | Admit: 2022-10-02 | Payer: Medicare Other | Source: Ambulatory Visit

## 2022-10-03 ENCOUNTER — Other Ambulatory Visit: Payer: Self-pay

## 2022-10-03 MED ORDER — VALSARTAN 160 MG PO TABS: 160.0000 mg | ORAL_TABLET | Freq: Every day | ORAL | 1 refills | Status: DC

## 2022-10-03 MED ORDER — ALENDRONATE SODIUM 70 MG PO TABS: ORAL_TABLET | ORAL | 0 refills | Status: DC

## 2022-10-03 NOTE — Telephone Encounter (Signed)
Patient out of blood pressure meds. [facility] told [facility] have [facility] request [facility]x (last week and this week)  I cannot tell if it is attached to this refill request for her generic Fosamax.  She is not out of Fosamax.  She is requesting new prescription before next fill date.  Again, out of blood pressure meds, going on second day with out BP med.  [facility]  valsartan (DIOVAN) 160 MG tablet   Please call patient with update [phone]

## 2022-10-03 NOTE — Telephone Encounter (Signed)
Refill sent and pt scheduled for CPE

## 2022-10-06 NOTE — Telephone Encounter (Signed)
Spoke with pt. She states she is feeling better now since taking Augmentin. Pt denies prod cough or fever at at time. Pt will proceed with her f/u Chest CT that is scheduled for tomorrow 10/07/22.

## 2022-10-07 ENCOUNTER — Ambulatory Visit
Admission: RE | Admit: 2022-10-07 | Discharge: 2022-10-07 | Disposition: A | Discharge status: Home / Self Care | Payer: Medicare Other | Source: Ambulatory Visit | Attending: Acute Care | Admitting: Acute Care

## 2022-10-07 DIAGNOSIS — R911 Solitary pulmonary nodule: Secondary | ICD-10-CM

## 2022-10-07 DIAGNOSIS — R599 Enlarged lymph nodes, unspecified: Secondary | ICD-10-CM | POA: Diagnosis not present

## 2022-10-07 DIAGNOSIS — R918 Other nonspecific abnormal finding of lung field: Secondary | ICD-10-CM | POA: Diagnosis not present

## 2022-10-07 DIAGNOSIS — Z87891 Personal history of nicotine dependence: Secondary | ICD-10-CM

## 2022-10-07 DIAGNOSIS — J42 Unspecified chronic bronchitis: Secondary | ICD-10-CM | POA: Diagnosis not present

## 2022-10-15 NOTE — Telephone Encounter (Signed)
I have attempted to call the patient with the results of their  Low Dose CT Chest Lung cancer screening scan. There was no answer. I have left a HIPPA compliant VM requesting the patient call the office for the scan results. I included the office contact information in the message. We will await his return call. If no return call we will continue to call until patient is contacted.   Angelique Blonder, this is a Hospital doctor patient. I have messaged him to see if he wants to get this patient on his schedule. He may want to treat her and then repeat the scan.

## 2022-10-15 NOTE — Telephone Encounter (Signed)
She has severe emphysema and bronchiectasis. I wouldn't be surprised if she has MAC. She has always been reluctant to do any interventions. It's been a minute since I have seen her. Okay to set her up for a follow up with me. If I don't have anything available, then she can get an appointment with you. If she needed a procedure at this it would be to do a BAL to assess for MAC.

## 2022-10-16 ENCOUNTER — Ambulatory Visit (INDEPENDENT_AMBULATORY_CARE_PROVIDER_SITE_OTHER)
Admission: RE | Admit: 2022-10-16 | Discharge: 2022-10-16 | Disposition: A | Discharge status: Home / Self Care | Payer: Medicare Other | Source: Ambulatory Visit | Attending: Family Medicine | Admitting: Family Medicine

## 2022-10-16 ENCOUNTER — Encounter: Payer: Self-pay | Admitting: Family Medicine

## 2022-10-16 ENCOUNTER — Telehealth: Payer: Self-pay | Admitting: Acute Care

## 2022-10-16 ENCOUNTER — Ambulatory Visit (INDEPENDENT_AMBULATORY_CARE_PROVIDER_SITE_OTHER): Payer: Medicare Other | Admitting: Family Medicine

## 2022-10-16 VITALS — BP 128/80 | HR 84 | Temp 97.9°F | Ht 64.0 in | Wt 108.6 lb

## 2022-10-16 DIAGNOSIS — R053 Chronic cough: Secondary | ICD-10-CM

## 2022-10-16 DIAGNOSIS — J181 Lobar pneumonia, unspecified organism: Secondary | ICD-10-CM

## 2022-10-16 DIAGNOSIS — R509 Fever, unspecified: Secondary | ICD-10-CM

## 2022-10-16 DIAGNOSIS — J42 Unspecified chronic bronchitis: Secondary | ICD-10-CM

## 2022-10-16 DIAGNOSIS — R0989 Other specified symptoms and signs involving the circulatory and respiratory systems: Secondary | ICD-10-CM | POA: Diagnosis not present

## 2022-10-16 DIAGNOSIS — J411 Mucopurulent chronic bronchitis: Secondary | ICD-10-CM

## 2022-10-16 DIAGNOSIS — J439 Emphysema, unspecified: Secondary | ICD-10-CM | POA: Diagnosis not present

## 2022-10-16 LAB — POC COVID19 BINAXNOW: SARS Coronavirus 2 Ag: NEGATIVE

## 2022-10-16 MED ORDER — PREDNISONE 20 MG PO TABS
ORAL_TABLET | ORAL | 0 refills | Status: DC
Start: 2022-10-16 — End: 2022-11-04

## 2022-10-16 MED ORDER — LEVOFLOXACIN 500 MG PO TABS
500.0000 mg | ORAL_TABLET | Freq: Every day | ORAL | 0 refills | Status: AC
Start: 2022-10-16 — End: 2022-10-23

## 2022-10-16 NOTE — Telephone Encounter (Signed)
Dr. Neva Seat would like to speak to Kandice Robinsons NP today. Dr, Neva Seat phone number is [phone].

## 2022-10-16 NOTE — Telephone Encounter (Signed)
No call needed at this point.  I was able to contact Dr. Craige Cotta and plan developed for patient.  Happy to speak to [name] still if needed but not required.  Thanks.

## 2022-10-16 NOTE — Patient Instructions (Addendum)
Thanks for coming in today.  Please have x-ray and I will keep an eye out for your blood counts to see if those are back this afternoon.  Either way I will coordinate next step and possible new antibiotic or meds with your pulmonologist.  I will give them a call shortly and will let you know this afternoon or evening what new meds or treatments are needed.  Okay to continue your routine medications, albuterol if needed for increased wheeze.   Thank you for coming in today.  As we discussed on the phone can start the Levaquin antibiotic and prednisone, keep follow-up with pulmonary as planned.  If any new or worsening symptoms over the weekend including acute shortness of breath, worsening fevers or other worsening symptoms be seen in urgent care or ER.  Take care

## 2022-10-16 NOTE — Progress Notes (Signed)
Subjective:  [name], female    DOB: 1955/07/04  Age: 67 y.o. MRN: [id number]  CC:  Chief Complaint  Patient presents with   Nasal Congestion    Chest congestion notes she did have a CT scan done a few days ago, low grade fever, was recently traveling from [place], nausea, fatigue, headaches and head fog    HPI SHARAYNE COUSIN presents for   Congestion: Seen acutely in our office today for symptoms above.  PCP Dr. Claiborne Billings  Recently had CT chest,  June 4.  Chronic bronchiectasis, mucoid impaction, tree-in-bud opacities throughout both lungs with waxing and waning change since February chest CT.  New 20.8 mm indistinct nodular focus of consolidation in the peripheral left lung base.  Mostly likely due to recurrent aspiration or atypical mycobacterial infection, MAI.  Pulmonary consultation with high-resolution chest CT follow-up as warranted.  Stable nonspecific mild mediastinal lymphadenopathy favoring benign reactive etiology, aortic atherosclerosis and emphysema. Phone note from pulmonary reviewed from earlier today.  Discussed with patient's primary pulmonologist, Dr. Craige Cotta.  Recommended patient see DR. Sood or Jairo Ben as soon as possible for treatment, first available appointment June 18.  Earlier appointment with PCP if needed for acute treatment.  Appointment June 18 with pulmonary to determine if bronchoscopy for BAL sampling is best plan of action.  Patient had denied aspiration symptoms.  Chronic smokers cough, then some increased congestion and fever since yesterday. 100 yesterday. Recent travel to [place], returned yesterday. No known sick contacts but around grandchildren.  Treated with Augmentin for 1 week on 5/24 for impacted congestion. Better with recent Augmentin until yesterday. Off abx for over a week at that time.  No recent covid test.  No new treatments. Using trelegy, and albuterol - same past few days. None needed today. Used more in [place] with hiking  and dust exposure.  Runny nose past 8-9 days, intermittent.  Slight nausea, no vomiting. No sore throat.  Has taken doxycycline, or Levaquin with previous COPD flares.  Last Levaquin in October 2023.     History Patient Active Problem List   Diagnosis Date Noted   Trigger point of left shoulder region 06/12/2021   Polycythemia 07/30/2020   Essential hypertension 07/27/2020   Family history of heart disease 07/27/2020   Nocturnal hypoxemia due to emphysema (HCC) 08/10/2018   Nonallopathic lesion of lumbosacral region 01/12/2018   Nonallopathic lesion of sacral region 01/12/2018   Nonallopathic lesion of thoracic region 01/12/2018   Nonallopathic lesion of rib cage 01/12/2018   Nonallopathic lesion of cervical region 01/12/2018   Osteoporosis without current pathological fracture 10/28/2017   Pulmonary nodules/lesions, multiple 04/23/2016   Mediastinal adenopathy 02/22/2016   Current every day smoker 02/22/2016   Hematuria 06/07/2015   Meniere disorder 01/23/2012   COPD (chronic obstructive pulmonary disease) (HCC) 12/04/2008   Past Medical History:  Diagnosis Date   Acute bursitis of right shoulder 09/16/2017   Injected in November 13, 2017   Acute lateral meniscal tear 09/09/2017   Arthritis    Avulsion fracture of lateral malleolus of right fibula 07/20/2018   Basal cell carcinoma    COPD (chronic obstructive pulmonary disease) (HCC)    DVT (deep venous thrombosis) (HCC)    Essential hypertension 07/27/2020   History of acute bronchitis 03/24/2011   Kidney stones    Lymphadenopathy, abdominal 03/2013   Noted on abd/pelv CT done to eval slow-to-resolve pyelo   Meniere disorder 01/23/2012   Osteoporosis 12/2014   Dr. Jennette Kettle,  her GYN, dx'd her   Pneumonia    Pyelonephritis 10/28/2017   Recurrent pyelonephritis    Seasonal allergic rhinitis    Squamous cell carcinoma in situ (SCCIS) of skin of lower leg 05/04/2018   Dr. Glade Stanford, [facility]   Squamous cell  carcinoma in situ (SCCIS) of skin of lower leg 05/04/2018   Dr. Glade Stanford, [facility]  Invasive Squamous cell carcinoma margins of excision appear free of tumor in the sections examined.  Left Lateral Leg: Superficially invasive squamous cell carcinoma, extending to the deep margin.    Past Surgical History:  Procedure Laterality Date   DEXA  12/2014   T score spine -2.4, hip -3.5 (Dr. Jennette Kettle, [facility].   DILATION AND CURETTAGE OF UTERUS  2009   SKIN LESION EXCISION Left 05/24/2018   SCC; Skin, Excision, Left Laeral leg: postsurgical scar, no neoplasm is identified.  Skin, Shave bx and ED&C, right anterior thigh-Superficially invasive squamous cell carcinoma, extending to the deep margin.   No Known Allergies Prior to Admission medications   Medication Sig Start Date End Date Taking? Authorizing Provider  albuterol (PROVENTIL) (2.5 MG/3ML) 0.083% nebulizer solution Take 3 mLs (2.5 mg total) by nebulization every 6 (six) hours as needed for wheezing or shortness of breath. 11/29/19  Yes Kuneff, Renee A, DO  albuterol (VENTOLIN HFA) 108 (90 Base) MCG/ACT inhaler INHALE 1-2 PUFFS INTO THE LUNGS EVERY 4 HOURS AS NEEDED FOR WHEEZING OR SHORTNESS OF BREATH. 01/22/22  Yes Kuneff, Renee A, DO  alendronate (FOSAMAX) 70 MG tablet Take with a full glass of water on an empty stomach. 10/03/22  Yes Kuneff, Renee A, DO  Azelastine HCl 137 MCG/SPRAY SOLN PLACE 1 SPRAY INTO BOTH NOSTRILS 2 (TWO) TIMES DAILY. USE IN EACH NOSTRIL AS DIRECTED 02/21/22  Yes Kuneff, Renee A, DO  Fluticasone-Umeclidin-Vilant (TRELEGY ELLIPTA) 100-62.5-25 MCG/ACT AEPB INHALE 1 PUFF BY MOUTH EVERY DAY 06/04/22  Yes Sood, Laurier Nancy, MD  ipratropium (ATROVENT) 0.06 % nasal spray PLACE 2 SPRAYS INTO BOTH NOSTRILS 4 TIMES DAILY. 04/01/22  Yes Kuneff, Renee A, DO  meclizine (ANTIVERT) 25 MG tablet Take 1 tablet (25 mg total) by mouth 2 (two) times daily as needed for dizziness. 07/15/21  Yes Kuneff, Renee A, DO   montelukast (SINGULAIR) 10 MG tablet TAKE 1 TABLET BY MOUTH EVERYDAY AT BEDTIME 11/22/21  Yes Coralyn Helling, MD  Multiple Vitamin (MULTIVITAMIN) tablet Take 1 tablet by mouth daily.   Yes [provider]  Respiratory Therapy Supplies (FLUTTER) DEVI Twice a day and prn as needed, may increase if feeling worse 09/10/18  Yes Coral Ceo, NP  Spacer/Aero-Holding Chambers (AEROCHAMBER MV) inhaler Use as instructed 02/02/17  Yes Coralyn Helling, MD  valsartan (DIOVAN) 160 MG tablet Take 1 tablet (160 mg total) by mouth daily. 10/03/22  Yes Kuneff, Renee A, DO  VITAMIN D, CHOLECALCIFEROL, PO Take 5,000 Units by mouth daily.    Yes [provider]  predniSONE (DELTASONE) 20 MG tablet Take  40mg  x 2 days,30 mg x 2 days, then  20mg  x 2 days, then 10mg  x 2 days then stop Patient not taking: Reported on 10/16/2022 09/12/22   Bevelyn Ngo, NP  tretinoin (RETIN-A) 0.05 % cream APPLY TO AFFECTED AREA EVERY DAY AT BEDTIME **PA SENT** 02/02/19   [provider]   Social History   Socioeconomic History   Marital status: Married    Spouse name: Not on file   Number of children: 3  Years of education: Not on file   Highest education level: Not on file  Occupational History   Occupation: housewife  Tobacco Use   Smoking status: Every Day    Packs/day: 1.00    Years: 40.00    Additional pack years: 0.00    Total pack years: 40.00    Types: Cigarettes    Start date: 06/01/1978   Smokeless tobacco: Never   Tobacco comments:    still smoking a pack a day as of 09/18/2020  Vaping Use   Vaping Use: Never used  Substance and Sexual Activity   Alcohol use: Yes    Alcohol/week: 7.0 standard drinks of alcohol    Types: 7 Glasses of wine per week    Comment: 1 per day   Drug use: No   Sexual activity: Yes    Partners: Male  Other Topics Concern   Not on file  Social History Narrative   Not on file   Social Determinants of Health   Financial Resource Strain: Not on file  Food  Insecurity: Not on file  Transportation Needs: Not on file  Physical Activity: Not on file  Stress: Not on file  Social Connections: Not on file  Intimate Partner Violence: Not on file    Review of Systems Per hpi  Objective:   Vitals:   10/16/22 1407  BP: 128/80  Pulse: 84  Temp: 97.9 F (36.6 C)  TempSrc: Temporal  SpO2: 95%  Weight: 108 lb 9.6 oz (49.3 kg)  Height: 5\' 4"  (1.626 m)     Physical Exam Vitals reviewed.  Constitutional:      Appearance: Normal appearance. She is well-developed.  HENT:     Head: Normocephalic and atraumatic.  Eyes:     Conjunctiva/sclera: Conjunctivae normal.     Pupils: Pupils are equal, round, and reactive to light.  Neck:     Vascular: No carotid bruit.  Cardiovascular:     Rate and Rhythm: Normal rate and regular rhythm.     Heart sounds: Normal heart sounds.  Pulmonary:     Effort: Pulmonary effort is normal.     Breath sounds: Rhonchi (left lower lobe greater than right lower lobe, speaking in full sentences, no distress.  Possible faint end expiratory wheeze but overall clear upper lung fields.) present.  Abdominal:     Palpations: Abdomen is soft. There is no pulsatile mass.     Tenderness: There is no abdominal tenderness.  Musculoskeletal:     Right lower leg: No edema.     Left lower leg: No edema.  Skin:    General: Skin is warm and dry.  Neurological:     Mental Status: She is alert and oriented to person, place, and time.  Psychiatric:        Mood and Affect: Mood normal.        Behavior: Behavior normal.     Results for orders placed or performed in visit on 10/16/22  POC COVID-19 BinaxNow  Result Value Ref Range   SARS Coronavirus 2 Ag Negative Negative   CXR today: IMPRESSION: 1. Chronic hyperinflation, emphysematous changes, and lower lung interstitial thickening and airspace opacities appear not significantly changed from prior radiographs. Note is made the middle lobe, lingula, and lower lobe  distribution of mucoid impaction and tree-in-bud opacities on prior CT raises the question of a chronic atypical indolent process such as MAI. 2. Chronic posterior costophrenic angle blunting, likely from scarring. No pleural effusion seen on recent 10/07/2022 CT.  Assessment & Plan:  AZELLE BALBACH is a 67 y.o. female . Chronic cough - Plan: DG Chest 2 View, POC COVID-19 BinaxNow, CBC  Chest congestion - Plan: DG Chest 2 View, POC COVID-19 BinaxNow, CBC  Fever, unspecified fever cause - Plan: DG Chest 2 View, POC COVID-19 BinaxNow, CBC  Consolidation of left lower lobe of lung (HCC)  Chronic bronchitis, unspecified chronic bronchitis type (HCC)  Underlying COPD with possible MAI on recent imaging, plan follow-up with pulmonary next week.  Recent travel, some dust exposure, increased albuterol use as above, now with increased congestion past 2 days.  Low-grade fever, afebrile in office.  COVID testing negative.  Chest x-ray without apparent acute findings or infiltrate. CBC pending.  Possible flare of COPD. discussed briefly with her pulmonologist via staff message.  Will keep follow-up next week with pulmonary to decide on further testing for MAI, ok to treat with abx, prednisone for now.  -For now will treat with Levaquin, prednisone taper, with RTC/ER precautions.  Potential side effects and risks of medications discussed including potential risks specifically of quinolone antibiotics including but not limited to tendinopathy/tendon rupture, neuropathy.  Understanding expressed with discussion on phone after visit and ER precautions discussed.  All questions answered.    No orders of the defined types were placed in this encounter.  Patient Instructions  Thanks for coming in today.  Please have x-ray and I will keep an eye out for your blood counts to see if those are back this afternoon.  Either way I will coordinate next step and possible new antibiotic or meds with your  pulmonologist.  I will give them a call shortly and will let you know this afternoon or evening what new meds or treatments are needed.  Okay to continue your routine medications, albuterol if needed for increased wheeze.   Thank you for coming in today.  As we discussed on the phone can start the Levaquin antibiotic and prednisone, keep follow-up with pulmonary as planned.  If any new or worsening symptoms over the weekend including acute shortness of breath, worsening fevers or other worsening symptoms be seen in urgent care or ER.  Take care    Signed,   Meredith Staggers, MD [facility], [facility] 10/16/22 3:17 PM

## 2022-10-16 NOTE — Telephone Encounter (Signed)
See telephone note from 10/16/22

## 2022-10-16 NOTE — Telephone Encounter (Signed)
I have sent this over to Maralyn Sago

## 2022-10-16 NOTE — Telephone Encounter (Signed)
I have called the patient with her CT results.  I explained that her low-dose CT was read as a lung RADS 4B.  This was a follow-up scan after a lung RADS 4A scan in February 2024. Recent scan shows chronic bronchiectasis, mucoid impaction tree-in-bud opacities throughout both lungs.  This has been waxing and waning since the February 2024 scan however now there is a new 20.8 mm indistinct nodular focus of consolidation in the peripheral left lung base.  These findings are most likely recurrent aspiration or an atypical mycobacterial infection such as MAI. I discussed this with Dr. Craige Cotta the patient's primary pulmonologist.  Plan is to get the patient into see him or myself as soon as possible for treatment. The patient just got home from [place] last night.  She is complaining of chest congestion and thick secretions. We have discussed that the earliest appointment I can get with either Dr. Craige Cotta or myself is Tuesday, June 18 at 115 at [facility].  I have told her to see if she can see her primary care provider if she feels she needs to be treated prior to that date. Dr. Claiborne Billings, patient CT looks like she potentially has MAI.  We had scheduled her to see Dr. Craige Cotta Tuesday, June 18 at 1:15 PM at the [facility] office. I have asked her to reach out to you if she feels she needs treatment prior to this date, as we do not have any acute visits or availability to see her before that June 18 date.  Thank you so much Sherre Lain, and Robynn Pane, plan is for follow-up with Dr. Kathi Ludwig on [phone] to determine if bronchoscopy for BAL sampling is best plan of action. Patient does not feel like she is aspirating while she eats.  She was coughing on the phone while we were talking.

## 2022-10-16 NOTE — Telephone Encounter (Signed)
Pt has been scheduled to see Dr Craige Cotta 10/21/22 at 1:15.

## 2022-10-17 LAB — CBC
HCT: 50.2 % — ABNORMAL HIGH (ref 36.0–46.0)
Hemoglobin: 16.3 g/dL — ABNORMAL HIGH (ref 12.0–15.0)
MCHC: 32.5 g/dL (ref 30.0–36.0)
MCV: 99.9 fl (ref 78.0–100.0)
Platelets: 205 10*3/uL (ref 150.0–400.0)
RBC: 5.03 Mil/uL (ref 3.87–5.11)
RDW: 14.7 % (ref 11.5–15.5)
WBC: 10.3 10*3/uL (ref 4.0–10.5)

## 2022-10-21 ENCOUNTER — Encounter (HOSPITAL_BASED_OUTPATIENT_CLINIC_OR_DEPARTMENT_OTHER): Payer: Self-pay | Admitting: Pulmonary Disease

## 2022-10-21 ENCOUNTER — Ambulatory Visit (HOSPITAL_BASED_OUTPATIENT_CLINIC_OR_DEPARTMENT_OTHER): Payer: Medicare Other | Admitting: Pulmonary Disease

## 2022-10-21 VITALS — BP 136/88 | HR 120 | Temp 98.6°F | Ht 64.0 in | Wt 108.2 lb

## 2022-10-21 DIAGNOSIS — J441 Chronic obstructive pulmonary disease with (acute) exacerbation: Secondary | ICD-10-CM

## 2022-10-21 DIAGNOSIS — Z716 Tobacco abuse counseling: Secondary | ICD-10-CM

## 2022-10-21 DIAGNOSIS — J471 Bronchiectasis with (acute) exacerbation: Secondary | ICD-10-CM

## 2022-10-21 DIAGNOSIS — F17200 Nicotine dependence, unspecified, uncomplicated: Secondary | ICD-10-CM | POA: Diagnosis not present

## 2022-10-21 DIAGNOSIS — R062 Wheezing: Secondary | ICD-10-CM

## 2022-10-21 DIAGNOSIS — R911 Solitary pulmonary nodule: Secondary | ICD-10-CM | POA: Diagnosis not present

## 2022-10-21 MED ORDER — GUAIFENESIN ER 600 MG PO TB12: 600.0000 mg | ORAL_TABLET | Freq: Two times a day (BID) | ORAL | Status: DC

## 2022-10-21 MED ORDER — ALBUTEROL SULFATE (2.5 MG/3ML) 0.083% IN NEBU
2.5000 mg | INHALATION_SOLUTION | Freq: Four times a day (QID) | RESPIRATORY_TRACT | 5 refills | Status: AC | PRN
Start: 2022-10-21 — End: ?

## 2022-10-21 MED ORDER — ALBUTEROL SULFATE HFA 108 (90 BASE) MCG/ACT IN AERS: 2.0000 | INHALATION_SPRAY | Freq: Four times a day (QID) | RESPIRATORY_TRACT | 3 refills | Status: DC | PRN

## 2022-10-21 NOTE — Progress Notes (Signed)
[facility]  Chief Complaint  Patient presents with   Follow-up    Follow up.     Past Surgical History:  She  has a past surgical history that includes Dilation and curettage of uterus (2009); DEXA (12/2014); and Skin lesion excision (Left, 05/24/2018).  Past Medical History:  Pyelonephritis, Osteoporosis, Meniere disorder, Basal cell carcinoma  Constitutional:  BP 136/88 (BP Location: Left Arm, Patient Position: Sitting, Cuff Size: Normal)   Pulse (!) 120   Temp 98.6 F (37 C) (Oral)   Ht 5\' 4"  (1.626 m)   Wt 108 lb 3.2 oz (49.1 kg)   SpO2 94%   BMI 18.57 kg/m   Brief Summary:  [name] is a 67 y.o. female smoker with COPD from asthma, emphysema, chronic bronchitis and bronchiectasis.      Subjective:   She had more cough with yellow sputum.  She was on several rounds of antibiotics.  Most recently on levaquin with prednisone.  Has couple more days of therapy.  She is feeling some improvement.  Sputum volume decreased and is now more clear.  Not coughing as much.  Still feels tired.  No fever or hemoptysis.  Still smoking, but now less than 1 pack per day.  Physical Exam:   Appearance - well kempt, thin   ENMT - no sinus tenderness, no oral exudate, no LAN, Mallampati 3 airway, no stridor  Respiratory - equal breath sounds bilaterally, no wheezing or rales  CV - s1s2 regular rate and rhythm, no murmurs  Ext - no clubbing, no edema  Skin - no rashes  Psych - normal mood and affect   Pulmonary testing:  PFT 12/15/08 FEV1 2.20(90%), FVC 3.68(113%), FEV1% 60, TLC 6.54(132%), DLCO 75%, no BD response Sputum culture 06/27/16 >> Moraxella catarrhalis PFT 08/25/16 >> FEV1 1.55 (59%), FEV1% 57, TLC 5.33 (103%), DLCO 52% A1AT 07/14/18 >> 166, MM  Chest Imaging:  CT chest 04/21/16 >> borderline LAN, nodules in lung bases with tree in bud CT chest 10/22/16 >> moderate centrilobular emphysema, 1.2 cm nodule LUL, tree in  bud, RLL BTX CT chest 01/26/17 >> moderate centrilobular emphysema, mild b/l BTX with tree in bud, LUL nodule resolved, stable 7 mm RLL nodule since 2015 HRCT 12/01/18 >> atherosclerosis, apical scarring, centrilobular emphysema, nodularity in RLL, 6 mm nodule Rt major fissure CT chest 07/01/21 >> moderate centrilobular emphysema, scattered nodularity and mucoid impaction most in RML and lingula LDCT chest 10/14/22 >> mod/severe centrilobular emphysema, diffuse bronchial wall thickening, widespread cylindrical BTX with patchy mucoid impaction and tree in bud, new nodular focus in LLL 2.8 cm  Sleep Tests:  ONO with RA 07/29/18 >> test time 6 hrs 23 min.  Basal SpO2 86%, low SpO2 72%.  Spent 289.2 min with SpO2 < 88%.  Social History:  She  reports that she has been smoking cigarettes. She started smoking about 44 years ago. She has a 40.00 pack-year smoking history. She has never used smokeless tobacco. She reports current alcohol use of about 7.0 standard drinks of alcohol per week. She reports that she does not use drugs.  Family History:  Her family history includes Arthritis in her mother; Breast cancer in her maternal grandmother; Cancer in her father; Colon cancer (age of onset: 2) in her paternal aunt; Diabetes in her mother; Glaucoma in her mother; Heart attack in her paternal grandfather; Heart disease in her father and maternal grandmother; Hypertension in her father and mother; Irritable bowel syndrome  in her mother; Kidney disease in her mother; Other in her paternal grandmother; Stroke in her maternal grandfather and maternal grandmother.     Assessment/Plan:   COPD with chronic bronchitis, asthma, bronchiectasis and emphysema. - has some clinically improvement after treatment with levaquin and prednisone for recent exacerbation - continue trelegy 100 one puff daily, singulair 10 mg nightly - discussed importance of being consistent with bronchial hygiene regimen - she is to use  mucinex, albuterol nebulizer, and flutter valve morning and night - can add hypertonic saline and chest percussion system later if needed - if her symptoms recur after completing antibiotics and prednisone would then need to consider further assessment for atypical chronic infection such as MAI  Lung nodule. - will need follow up CT chest in December 2024   Tobacco abuse. - spent 4 minutes discussing smoking cessation options - she says she has all the resources and knows what to do, but she doesn't feel ready to quit yet   Allergic rhinitis. - continue azelastine, singulair - prn atrovent nasal spray   Time Spent Involved in Patient Care on Day of Examination:  38 minutes  Follow up:   Patient Instructions  Use mucinex, albuterol nebulizer, and flutter valve in the morning and the evening.  Use trelegy once per day, and rinse your mouth after each use.  Follow up in 6 weeks.  Medication List:   Allergies as of 10/21/2022   No Known Allergies      Medication List        Accurate as of October 21, 2022  1:40 PM. If you have any questions, ask your nurse or doctor.          STOP taking these medications    tretinoin 0.05 % cream Commonly known as: RETIN-A Stopped by: Coralyn Helling, MD       TAKE these medications    AeroChamber MV inhaler Use as instructed   albuterol (2.5 MG/3ML) 0.083% nebulizer solution Commonly known as: PROVENTIL Take 3 mLs (2.5 mg total) by nebulization every 6 (six) hours as needed for wheezing or shortness of breath. What changed: Another medication with the same name was changed. Make sure you understand how and when to take each. Changed by: Coralyn Helling, MD   albuterol 108 (90 Base) MCG/ACT inhaler Commonly known as: VENTOLIN HFA Inhale 2 puffs into the lungs every 6 (six) hours as needed for wheezing or shortness of breath. INHALE 1-2 PUFFS INTO THE LUNGS EVERY 4 HOURS AS NEEDED FOR WHEEZING OR SHORTNESS OF BREATH. What changed:   how much to take how to take this when to take this reasons to take this Changed by: Coralyn Helling, MD   alendronate 70 MG tablet Commonly known as: FOSAMAX Take with a full glass of water on an empty stomach.   Azelastine HCl 137 MCG/SPRAY Soln PLACE 1 SPRAY INTO BOTH NOSTRILS 2 (TWO) TIMES DAILY. USE IN EACH NOSTRIL AS DIRECTED   Flutter Devi Twice a day and prn as needed, may increase if feeling worse   guaiFENesin 600 MG 12 hr tablet Commonly known as: Mucinex Take 1 tablet (600 mg total) by mouth 2 (two) times daily. Started by: Coralyn Helling, MD   ipratropium 0.06 % nasal spray Commonly known as: ATROVENT PLACE 2 SPRAYS INTO BOTH NOSTRILS 4 TIMES DAILY. What changed: See the new instructions.   levofloxacin 500 MG tablet Commonly known as: LEVAQUIN Take 1 tablet (500 mg total) by mouth daily for 7 days.   meclizine  25 MG tablet Commonly known as: ANTIVERT Take 1 tablet (25 mg total) by mouth 2 (two) times daily as needed for dizziness.   montelukast 10 MG tablet Commonly known as: SINGULAIR TAKE 1 TABLET BY MOUTH EVERYDAY AT BEDTIME   multivitamin tablet Take 1 tablet by mouth daily.   predniSONE 20 MG tablet Commonly known as: DELTASONE Take  40mg  x 2 days,30 mg x 2 days, then  20mg  x 2 days, then 10mg  x 2 days then stop   Trelegy Ellipta 100-62.5-25 MCG/ACT Aepb Generic drug: Fluticasone-Umeclidin-Vilant INHALE 1 PUFF BY MOUTH EVERY DAY   valsartan 160 MG tablet Commonly known as: DIOVAN Take 1 tablet (160 mg total) by mouth daily.   VITAMIN D (CHOLECALCIFEROL) PO Take 5,000 Units by mouth daily.        Signature:  Coralyn Helling, MD [facility] Pulmonary/Critical Care Pager - [phone] 10/21/2022, 1:40 PM

## 2022-10-21 NOTE — Patient Instructions (Signed)
Use mucinex, albuterol nebulizer, and flutter valve in the morning and the evening.  Use trelegy once per day, and rinse your mouth after each use.  Follow up in 6 weeks.

## 2022-10-22 ENCOUNTER — Other Ambulatory Visit: Payer: Self-pay | Admitting: *Deleted

## 2022-10-22 DIAGNOSIS — Z87891 Personal history of nicotine dependence: Secondary | ICD-10-CM

## 2022-10-22 DIAGNOSIS — R911 Solitary pulmonary nodule: Secondary | ICD-10-CM

## 2022-10-22 NOTE — Telephone Encounter (Signed)
Per Dr Craige Cotta OV note from 10/21/22 pt will need follow up Chest CT in 04/2023. CT order has been placed.

## 2022-10-25 ENCOUNTER — Other Ambulatory Visit: Payer: Self-pay | Admitting: Family Medicine

## 2022-11-04 ENCOUNTER — Ambulatory Visit (INDEPENDENT_AMBULATORY_CARE_PROVIDER_SITE_OTHER): Payer: Medicare Other | Admitting: Family Medicine

## 2022-11-04 ENCOUNTER — Encounter: Payer: Self-pay | Admitting: Family Medicine

## 2022-11-04 VITALS — BP 136/83 | HR 87 | Temp 98.3°F | Ht 64.5 in | Wt 108.2 lb

## 2022-11-04 DIAGNOSIS — F17200 Nicotine dependence, unspecified, uncomplicated: Secondary | ICD-10-CM | POA: Diagnosis not present

## 2022-11-04 DIAGNOSIS — Z23 Encounter for immunization: Secondary | ICD-10-CM

## 2022-11-04 DIAGNOSIS — Z131 Encounter for screening for diabetes mellitus: Secondary | ICD-10-CM

## 2022-11-04 DIAGNOSIS — Z0000 Encounter for general adult medical examination without abnormal findings: Secondary | ICD-10-CM

## 2022-11-04 DIAGNOSIS — Z1231 Encounter for screening mammogram for malignant neoplasm of breast: Secondary | ICD-10-CM

## 2022-11-04 DIAGNOSIS — M818 Other osteoporosis without current pathological fracture: Secondary | ICD-10-CM

## 2022-11-04 DIAGNOSIS — I10 Essential (primary) hypertension: Secondary | ICD-10-CM | POA: Diagnosis not present

## 2022-11-04 DIAGNOSIS — R3129 Other microscopic hematuria: Secondary | ICD-10-CM

## 2022-11-04 LAB — COMPREHENSIVE METABOLIC PANEL
ALT: 12 U/L (ref 0–35)
AST: 27 U/L (ref 0–37)
Albumin: 3.7 g/dL (ref 3.5–5.2)
Alkaline Phosphatase: 61 U/L (ref 39–117)
BUN: 13 mg/dL (ref 6–23)
CO2: 28 mEq/L (ref 19–32)
Calcium: 9.3 mg/dL (ref 8.4–10.5)
Chloride: 102 mEq/L (ref 96–112)
Creatinine, Ser: 0.89 mg/dL (ref 0.40–1.20)
GFR: 67.48 mL/min (ref >60.00)
Glucose, Bld: 87 mg/dL (ref 70–99)
Potassium: 4 mEq/L (ref 3.5–5.1)
Sodium: 140 mEq/L (ref 135–145)
Total Bilirubin: 0.6 mg/dL (ref 0.2–1.2)
Total Protein: 6.2 g/dL (ref 6.0–8.3)

## 2022-11-04 LAB — LIPID PANEL
Cholesterol: 183 mg/dL (ref 0–200)
HDL: 78.2 mg/dL (ref >39.00)
LDL Cholesterol: 90 mg/dL (ref 0–99)
NonHDL: 105.11
Total CHOL/HDL Ratio: 2
Triglycerides: 74 mg/dL (ref 0.0–149.0)
VLDL: 14.8 mg/dL (ref 0.0–40.0)

## 2022-11-04 LAB — HEMOGLOBIN A1C: Hgb A1c MFr Bld: 6.2 % (ref 4.6–6.5)

## 2022-11-04 LAB — TSH: TSH: 2.85 u[IU]/mL (ref 0.35–5.50)

## 2022-11-04 LAB — VITAMIN D 25 HYDROXY (VIT D DEFICIENCY, FRACTURES): VITD: 66.1 ng/mL (ref 30.00–100.00)

## 2022-11-04 MED ORDER — IPRATROPIUM BROMIDE 0.06 % NA SOLN: 2.0000 | Freq: Four times a day (QID) | NASAL | 5 refills | Status: DC | PRN

## 2022-11-04 MED ORDER — VALSARTAN 160 MG PO TABS: 160.0000 mg | ORAL_TABLET | Freq: Every day | ORAL | 1 refills | Status: DC

## 2022-11-04 MED ORDER — TETANUS-DIPHTH-ACELL PERTUSSIS 5-2.5-18.5 LF-MCG/0.5 IM SUSP: 0.5000 mL | Freq: Once | INTRAMUSCULAR | 0 refills | Status: AC

## 2022-11-04 NOTE — Patient Instructions (Signed)
No follow-ups on file.        Great to see you today.  I have refilled the medication(s) we provide.   If labs were collected, we will inform you of lab results once received either by echart message or telephone call.   - echart message- for normal results that have been seen by the patient already.   - telephone call: abnormal results or if patient has not viewed results in their echart.  

## 2022-11-04 NOTE — Progress Notes (Unsigned)
[name], female  DOB: 16-Nov-1955, 67 y.o.   MRN: [id number] Patient Care Team    Relationship Specialty Notifications Start End  Natalia Leatherwood, DO PCP - General Family Medicine  04/23/15   Josph Macho, MD Consulting Physician Oncology  05/12/13   Coralyn Helling, MD Consulting Physician Pulmonary Disease  02/22/16   Jethro Bolus, MD (Inactive) Consulting Physician Urology  04/21/16   Hollace Kinnier, PA-C  Dermatology  07/04/16    Comment: [facility], Clifton Custard, DO Consulting Physician Sports Medicine  05/17/18   Plonk, Lorne Skeens, PA-C  Physician Assistant  05/25/18   Zelphia Cairo, MD Consulting Physician Obstetrics and Gynecology  12/30/21     Chief Complaint  Patient presents with   Annual Exam    Pt is not fasting; started taking OTC mag occasionally wonders if dosage is right; does not know exact dosage at the moment    Subjective: [name] is a 67 y.o.  Female  present for CPE and Chronic Conditions/illness Management  All past medical history, surgical history, allergies, family history, immunizations, medications and social history were updated in the electronic medical record today. All recent labs, ED visits and hospitalizations within the last year were reviewed.  Health maintenance:  Colonoscopy: completed 05/17/2019, by Dr. Leone Payor. follow up 10 yr. Mammogram: completed 09/02/2021, GYN. Has scheduled. Cervical cancer screening: last pap: 07/2020, completed by: gyn Immunizations: tdap DUE - printed (2nd), Influenza UTD (encouraged yearly), PNA  completed, zostavax/shingrix completed Infectious disease screening: HIV and Hep C completed DEXA: last completed 08/2020, result -3.6-osteoporosis , has scheduled Assistive device: none Oxygen WUJ:WJXB Patient has a Dental home. Hospitalizations/ED visits: reviewed  Primary hypertension/family history of heart disease Pt reports compliance with valsartan  160 mg daily. Patient denies chest pain, shortness of breath, dizziness or lower extremity edema.  Pt takes a  daily baby ASA. Pt is not prescribed statin. RF: htn, smoker, fhx heart disease.    Osteoporosis without current pathological fracture, unspecified osteoporosis type/Current every day smoker Patient last DEXA 08/2020-osteoporosis.  She has been on Fosamax weekly dosing and is tolerating.  She has been encouraged to stop smoking, continues to smoke daily.  She is taking her vitamin D supplementation.   COPD /pulmonary nodules/lesions, multiple/Abnormal CT scan of lung Follows with pulm routinely      10/16/2022    2:06 PM 07/15/2021    8:47 AM 06/12/2021    2:36 PM 06/27/2020    3:18 PM 11/21/2019    9:42 AM  Depression screen PHQ 2/9  Decreased Interest 0 0 0 0 0  Down, Depressed, Hopeless 0 0 0 0 0  PHQ - 2 Score 0 0 0 0 0  Altered sleeping 0      Tired, decreased energy 1      Change in appetite 1      Feeling bad or failure about yourself  0      Trouble concentrating 0      Moving slowly or fidgety/restless 0      Suicidal thoughts 0      PHQ-9 Score 2          10/16/2022    2:06 PM  GAD 7 : Generalized Anxiety Score  Nervous, Anxious, on Edge 0  Control/stop worrying 0  Worry too much - different things 0  Trouble relaxing 0  Restless 0  Easily annoyed or irritable 0  Afraid -  awful might happen 0  Total GAD 7 Score 0     Immunization History  Administered Date(s) Administered   Influenza Inj Mdck Quad Pf 02/28/2020   Influenza Split 04/09/2011   Influenza Whole 02/02/2009   Influenza,inj,Quad PF,6+ Mos 01/26/2014, 02/06/2017, 03/04/2018, 01/26/2019, 01/14/2021   Influenza-Unspecified 02/02/2013, 02/24/2015, 04/03/2016   Moderna Sars-Covid-2 Vaccination 07/19/2019, 08/16/2019   PNEUMOCOCCAL CONJUGATE-20 07/15/2021   Pneumococcal Polysaccharide-23 01/26/2019   Zoster Recombinant(Shingrix) 03/04/2018, 01/14/2021    Past Medical History:  Diagnosis Date    Acute bursitis of right shoulder 09/16/2017   Injected in November 13, 2017   Acute lateral meniscal tear 09/09/2017   Arthritis    Avulsion fracture of lateral malleolus of right fibula 07/20/2018   Basal cell carcinoma    COPD (chronic obstructive pulmonary disease) (HCC)    DVT (deep venous thrombosis) (HCC)    Essential hypertension 07/27/2020   History of acute bronchitis 03/24/2011   Kidney stones    Lymphadenopathy, abdominal 03/2013   Noted on abd/pelv CT done to eval slow-to-resolve pyelo   Meniere disorder 01/23/2012   Osteoporosis 12/2014   Dr. Jennette Kettle, her GYN, dx'd her   Pneumonia    Pyelonephritis 10/28/2017   Recurrent pyelonephritis    Seasonal allergic rhinitis    Squamous cell carcinoma in situ (SCCIS) of skin of lower leg 05/04/2018   Dr. Glade Stanford, [facility]   Squamous cell carcinoma in situ (SCCIS) of skin of lower leg 05/04/2018   Dr. Glade Stanford, [facility]  Invasive Squamous cell carcinoma margins of excision appear free of tumor in the sections examined.  Left Lateral Leg: Superficially invasive squamous cell carcinoma, extending to the deep margin.    No Known Allergies Past Surgical History:  Procedure Laterality Date   DEXA  12/2014   T score spine -2.4, hip -3.5 (Dr. Jennette Kettle, [facility].   DILATION AND CURETTAGE OF UTERUS  2009   SKIN LESION EXCISION Left 05/24/2018   SCC; Skin, Excision, Left Laeral leg: postsurgical scar, no neoplasm is identified.  Skin, Shave bx and ED&C, right anterior thigh-Superficially invasive squamous cell carcinoma, extending to the deep margin.   Family History  Problem Relation Age of Onset   Hypertension Mother    Glaucoma Mother    Irritable bowel syndrome Mother    Arthritis Mother    Kidney disease Mother    Diabetes Mother    Hypertension Father    Heart disease Father        bypasses   Cancer Father        sarcoma   Colon cancer Paternal Aunt 62   Heart disease  Maternal Grandmother        CHF   Stroke Maternal Grandmother        in his 67's   Breast cancer Maternal Grandmother    Stroke Maternal Grandfather    Other Paternal Grandmother        hardening of the arteries   Heart attack Paternal Grandfather    Stomach cancer Neg Hx    Social History   Social History Narrative   Not on file    Allergies as of 11/04/2022   No Known Allergies      Medication List        Accurate as of November 04, 2022 11:59 PM. If you have any questions, ask your nurse or doctor.          STOP taking these medications    Azelastine HCl  137 MCG/SPRAY Soln Stopped by: Felix Pacini, DO   meclizine 25 MG tablet Commonly known as: ANTIVERT Stopped by: Felix Pacini, DO   predniSONE 20 MG tablet Commonly known as: DELTASONE Stopped by: Felix Pacini, DO       TAKE these medications    AeroChamber MV inhaler Use as instructed   albuterol (2.5 MG/3ML) 0.083% nebulizer solution Commonly known as: PROVENTIL Take 3 mLs (2.5 mg total) by nebulization every 6 (six) hours as needed for wheezing or shortness of breath.   albuterol 108 (90 Base) MCG/ACT inhaler Commonly known as: VENTOLIN HFA Inhale 2 puffs into the lungs every 6 (six) hours as needed for wheezing or shortness of breath. INHALE 1-2 PUFFS INTO THE LUNGS EVERY 4 HOURS AS NEEDED FOR WHEEZING OR SHORTNESS OF BREATH.   alendronate 70 MG tablet Commonly known as: FOSAMAX Take with a full glass of water on an empty stomach.   fluorouracil 5 % cream Commonly known as: EFUDEX Apply topically 3 (three) times daily as needed.   Flutter Devi Twice a day and prn as needed, may increase if feeling worse   guaiFENesin 600 MG 12 hr tablet Commonly known as: Mucinex Take 1 tablet (600 mg total) by mouth 2 (two) times daily.   ipratropium 0.06 % nasal spray Commonly known as: ATROVENT Place 2 sprays into both nostrils 4 (four) times daily as needed for rhinitis. What changed: See the new  instructions. Changed by: Felix Pacini, DO   MAGNESIUM PO Take by mouth.   montelukast 10 MG tablet Commonly known as: SINGULAIR TAKE 1 TABLET BY MOUTH EVERYDAY AT BEDTIME   multivitamin tablet Take 1 tablet by mouth daily.   Tdap 5-2.5-18.5 LF-MCG/0.5 injection Commonly known as: BOOSTRIX Inject 0.5 mLs into the muscle once for 1 dose. Started by: Felix Pacini, DO   Trelegy Ellipta 100-62.5-25 MCG/ACT Aepb Generic drug: Fluticasone-Umeclidin-Vilant INHALE 1 PUFF BY MOUTH EVERY DAY   tretinoin 0.1 % cream Commonly known as: RETIN-A Apply topically at bedtime as needed.   valsartan 160 MG tablet Commonly known as: DIOVAN Take 1 tablet (160 mg total) by mouth daily.   VITAMIN D (CHOLECALCIFEROL) PO Take 5,000 Units by mouth daily.        All past medical history, surgical history, allergies, family history, immunizations andmedications were updated in the EMR today and reviewed under the history and medication portions of their EMR.      ROS 14 pt review of systems performed and negative (unless mentioned in an HPI)  Objective: BP 136/83   Pulse 87   Temp 98.3 F (36.8 C)   Ht 5' 4.5" (1.638 m)   Wt 108 lb 3.2 oz (49.1 kg)   SpO2 92%   BMI 18.29 kg/m  Physical Exam Vitals and nursing note reviewed.  Constitutional:      General: She is not in acute distress.    Appearance: Normal appearance. She is not ill-appearing or toxic-appearing.  HENT:     Head: Normocephalic and atraumatic.     Right Ear: Tympanic membrane, ear canal and external ear normal. There is no impacted cerumen.     Left Ear: Tympanic membrane, ear canal and external ear normal. There is no impacted cerumen.     Nose: No congestion or rhinorrhea.     Mouth/Throat:     Mouth: Mucous membranes are moist.     Pharynx: Oropharynx is clear. No oropharyngeal exudate or posterior oropharyngeal erythema.  Eyes:     General: No scleral icterus.  Right eye: No discharge.        Left eye:  No discharge.     Extraocular Movements: Extraocular movements intact.     Conjunctiva/sclera: Conjunctivae normal.     Pupils: Pupils are equal, round, and reactive to light.  Cardiovascular:     Rate and Rhythm: Normal rate and regular rhythm.     Pulses: Normal pulses.     Heart sounds: Normal heart sounds. No murmur heard.    No friction rub. No gallop.  Pulmonary:     Effort: Pulmonary effort is normal. No respiratory distress.     Breath sounds: Normal breath sounds. No stridor. No wheezing, rhonchi or rales.  Chest:     Chest wall: No tenderness.  Abdominal:     General: Abdomen is flat. Bowel sounds are normal. There is no distension.     Palpations: Abdomen is soft. There is no mass.     Tenderness: There is no abdominal tenderness. There is no right CVA tenderness, left CVA tenderness, guarding or rebound.     Hernia: No hernia is present.  Musculoskeletal:        General: No swelling, tenderness or deformity. Normal range of motion.     Cervical back: Normal range of motion and neck supple. No rigidity or tenderness.     Right lower leg: No edema.     Left lower leg: No edema.  Lymphadenopathy:     Cervical: No cervical adenopathy.  Skin:    General: Skin is warm and dry.     Coloration: Skin is not jaundiced or pale.     Findings: No bruising, erythema, lesion or rash.  Neurological:     General: No focal deficit present.     Mental Status: She is alert and oriented to person, place, and time. Mental status is at baseline.     Cranial Nerves: No cranial nerve deficit.     Sensory: No sensory deficit.     Motor: No weakness.     Coordination: Coordination normal.     Gait: Gait normal.     Deep Tendon Reflexes: Reflexes normal.  Psychiatric:        Mood and Affect: Mood normal.        Behavior: Behavior normal.        Thought Content: Thought content normal.        Judgment: Judgment normal.      No results found.  Assessment/plan: [name] is a 67  y.o. female present for CPE and routine chronic condition management Primary hypertension/family history of heart disease Stable Continue valsartan 160 daily Goal blood pressure:<130/80. Borderline today- but normal at other OV 2 weeks.  Cardiac CT screen ordered (self pay) Labs: cmp, tsh, lipids. Reviewed recent cbc by pulm.  Osteoporosis without current pathological fracture, unspecified osteoporosis type/Current every day smoker -Continue fosamax weekly - encouraged to stop smoking.  - Vitamin D collected today.  Continue supplement -DEXA ordered today   COPD/pulmonary nodules/lesions, multiple/Abnormal CT scan of lung Follows with pulm. Continue follow-up with pulm for management of COPD/inhalers. Encourage smoking cessation   Elevated hemoglobin: She has had chronic elevated hemoglobin dating back as far as 2015, that has steadily increased over the last few years.  She had declined referral to hematology in March 2022 for this condition. Decreasing tobacco use will help decrease hemoglobin. Encouraged hydration. Discussed possible benefits of daily baby aspirin.  Current every day smoker Encouraged to stop smoking.   Routine general medical examination  at a health care facility Patient was encouraged to exercise greater than 150 minutes a week. Patient was encouraged to choose a diet filled with fresh fruits and vegetables, and lean meats. AVS provided to patient today for education/recommendation on gender specific health and safety maintenance. Colonoscopy: completed 05/17/2019, by Dr. Leone Payor. follow up 10 yr. Mammogram: completed 09/02/2021, GYN. Has scheduled. Cervical cancer screening: last pap: 07/2020, completed by: gyn Immunizations: tdap DUE - printed (2nd), Influenza UTD (encouraged yearly), PNA  completed, zostavax/shingrix completed Infectious disease screening: HIV and Hep C completed DEXA: last completed 08/2020, result -3.6-osteoporosis , has scheduled  Return  in about 24 weeks (around 04/21/2023) for Routine chronic condition follow-up.   Orders Placed This Encounter  Procedures   CT CARDIAC SCORING (SELF PAY ONLY)   Hemoglobin A1c   Comprehensive metabolic panel   Lipid panel   TSH   Vitamin D (25 hydroxy)   POCT Urinalysis Dipstick (Automated)   Meds ordered this encounter  Medications   Tdap (BOOSTRIX) 5-2.5-18.5 LF-MCG/0.5 injection    Sig: Inject 0.5 mLs into the muscle once for 1 dose.    Dispense:  0.5 mL    Refill:  0   ipratropium (ATROVENT) 0.06 % nasal spray    Sig: Place 2 sprays into both nostrils 4 (four) times daily as needed for rhinitis.    Dispense:  15 mL    Refill:  5   valsartan (DIOVAN) 160 MG tablet    Sig: Take 1 tablet (160 mg total) by mouth daily.    Dispense:  90 tablet    Refill:  1   Referral Orders  No referral(s) requested today     Electronically signed by: Felix Pacini, DO [facility]

## 2022-11-05 DIAGNOSIS — Z48817 Encounter for surgical aftercare following surgery on the skin and subcutaneous tissue: Secondary | ICD-10-CM | POA: Diagnosis not present

## 2022-11-24 ENCOUNTER — Other Ambulatory Visit: Payer: Self-pay

## 2022-11-24 ENCOUNTER — Other Ambulatory Visit: Payer: Self-pay | Admitting: Pulmonary Disease

## 2022-11-24 MED ORDER — ALENDRONATE SODIUM 70 MG PO TABS: ORAL_TABLET | ORAL | 0 refills | Status: DC

## 2022-11-28 ENCOUNTER — Other Ambulatory Visit (HOSPITAL_BASED_OUTPATIENT_CLINIC_OR_DEPARTMENT_OTHER): Payer: Self-pay | Admitting: Pulmonary Disease

## 2022-11-28 ENCOUNTER — Encounter (HOSPITAL_BASED_OUTPATIENT_CLINIC_OR_DEPARTMENT_OTHER): Payer: Self-pay | Admitting: Pulmonary Disease

## 2022-11-28 ENCOUNTER — Ambulatory Visit (HOSPITAL_BASED_OUTPATIENT_CLINIC_OR_DEPARTMENT_OTHER): Payer: Medicare Other | Admitting: Pulmonary Disease

## 2022-11-28 VITALS — BP 128/70 | HR 75 | Resp 14 | Ht 64.5 in | Wt 109.4 lb

## 2022-11-28 DIAGNOSIS — J479 Bronchiectasis, uncomplicated: Secondary | ICD-10-CM

## 2022-11-28 DIAGNOSIS — Z716 Tobacco abuse counseling: Secondary | ICD-10-CM

## 2022-11-28 DIAGNOSIS — R911 Solitary pulmonary nodule: Secondary | ICD-10-CM | POA: Diagnosis not present

## 2022-11-28 DIAGNOSIS — J449 Chronic obstructive pulmonary disease, unspecified: Secondary | ICD-10-CM | POA: Diagnosis not present

## 2022-11-28 MED ORDER — ALBUTEROL SULFATE HFA 108 (90 BASE) MCG/ACT IN AERS: 2.0000 | INHALATION_SPRAY | Freq: Four times a day (QID) | RESPIRATORY_TRACT | 3 refills | Status: DC | PRN

## 2022-11-28 MED ORDER — SODIUM CHLORIDE 3 % IN NEBU: INHALATION_SOLUTION | Freq: Two times a day (BID) | RESPIRATORY_TRACT | 12 refills | Status: DC | PRN

## 2022-11-28 NOTE — Progress Notes (Signed)
[place]  Chief Complaint  Patient presents with   Follow-up    6  weeks follow up , possible refill on inhaler     Past Surgical History:  She  has a past surgical history that includes Dilation and curettage of uterus (2009); DEXA (12/2014); and Skin lesion excision (Left, 05/24/2018).  Past Medical History:  Pyelonephritis, Osteoporosis, Meniere disorder, Basal cell carcinoma  Constitutional:  BP 128/70   Pulse 75   Resp 14   Ht 5' 4.5" (1.638 m)   Wt 109 lb 6.4 oz (49.6 kg)   SpO2 97%   BMI 18.49 kg/m   Brief Summary:  [name] is a 67 y.o. female smoker with COPD from asthma, emphysema, chronic bronchitis and bronchiectasis.      Subjective:   She has cough several times per day.  Phlegm now mostly clear, but sometimes can be yellow-green.  Not having wheeze, fever, chest pain, or hemoptysis.  She has been pulling old carpet in her mother's old house.  She has noticed feeling more congested.  Has been using flutter valve and mucinex.  Has tried chest cupping.  Using albuterol and hypertonic saline, but these make her feel jittery.  Still smoking cigarettes.  Physical Exam:   Appearance - well kempt, thin  ENMT - no sinus tenderness, no oral exudate, no LAN, Mallampati 2 airway, no stridor  Respiratory - scattered rhonchi  CV - s1s2 regular rate and rhythm, no murmurs  Ext - no clubbing, no edema  Skin - no rashes  Psych - normal mood and affect   Pulmonary testing:  PFT 12/15/08 FEV1 2.20(90%), FVC 3.68(113%), FEV1% 60, TLC 6.54(132%), DLCO 75%, no BD response Sputum culture 06/27/16 >> Moraxella catarrhalis PFT 08/25/16 >> FEV1 1.55 (59%), FEV1% 57, TLC 5.33 (103%), DLCO 52% A1AT 07/14/18 >> 166, MM  Chest Imaging:  CT chest 04/21/16 >> borderline LAN, nodules in lung bases with tree in bud CT chest 10/22/16 >> moderate centrilobular emphysema, 1.2 cm nodule LUL, tree in bud, RLL BTX CT chest 01/26/17 >>  moderate centrilobular emphysema, mild b/l BTX with tree in bud, LUL nodule resolved, stable 7 mm RLL nodule since 2015 HRCT 12/01/18 >> atherosclerosis, apical scarring, centrilobular emphysema, nodularity in RLL, 6 mm nodule Rt major fissure CT chest 07/01/21 >> moderate centrilobular emphysema, scattered nodularity and mucoid impaction most in RML and lingula LDCT chest 10/14/22 >> mod/severe centrilobular emphysema, diffuse bronchial wall thickening, widespread cylindrical BTX with patchy mucoid impaction and tree in bud, new nodular focus in LLL 2.8 cm  Sleep Tests:  ONO with RA 07/29/18 >> test time 6 hrs 23 min.  Basal SpO2 86%, low SpO2 72%.  Spent 289.2 min with SpO2 < 88%.  Social History:  She  reports that she has been smoking cigarettes. She started smoking about 44 years ago. She has a 44.5 pack-year smoking history. She has never used smokeless tobacco. She reports current alcohol use of about 7.0 standard drinks of alcohol per week. She reports that she does not use drugs.  Family History:  Her family history includes Arthritis in her mother; Breast cancer in her maternal grandmother; Cancer in her father; Colon cancer (age of onset: 69) in her paternal aunt; Diabetes in her mother; Glaucoma in her mother; Heart attack in her paternal grandfather; Heart disease in her father and maternal grandmother; Hypertension in her father and mother; Irritable bowel syndrome in her mother; Kidney disease in her mother; Other  in her paternal grandmother; Stroke in her maternal grandfather and maternal grandmother.     Discussion:  She has radiographic evidence for bronchiectasis.  She has daily productive cough that has been present for years.  She has frequent exacerbations requiring antibiotics.  She has been maintained on mucinex, flutter, valve, manual chest percussion, and hypertonic saline nebulizer treatments.  She continues to have difficulty with sputum production and expectoration.  I think  she would be a good candidate for a SmartVest chest percussion system to help reduce the frequency of exacerbations.  Assessment/Plan:   COPD with chronic bronchitis, asthma, bronchiectasis and emphysema. - continue trelegy 100 one puff daily, singulair 10 mg nightly - continue mucinex and flutter valve bid - albuterol and hypertonic saline nebulizer bid as tolerated - will arrange for SmartVest - will send sputum for gram stain and culture and AFB - might need assessment for ABPA if she continues to have flare ups, and sputum culture is unrevealing  Lung nodule. - will need follow up CT chest in December 2024   Tobacco abuse. - spent 6 minutes discussing smoking cessation options - she says she has all the resources and knows what to do, but she doesn't feel ready to quit yet   Allergic rhinitis. - continue azelastine, singulair - prn atrovent nasal spray   Time Spent Involved in Patient Care on Day of Examination:  38 minutes  Follow up:   Patient Instructions  Will arrange for SmartVest chest percussion system  Will arrange for sputum cultures  Follow up in 3 months  Medication List:   Allergies as of 11/28/2022   No Known Allergies      Medication List        Accurate as of November 28, 2022  9:34 AM. If you have any questions, ask your nurse or doctor.          AeroChamber MV inhaler Use as instructed   albuterol (2.5 MG/3ML) 0.083% nebulizer solution Commonly known as: PROVENTIL Take 3 mLs (2.5 mg total) by nebulization every 6 (six) hours as needed for wheezing or shortness of breath.   albuterol 108 (90 Base) MCG/ACT inhaler Commonly known as: VENTOLIN HFA Inhale 2 puffs into the lungs every 6 (six) hours as needed for wheezing or shortness of breath. INHALE 1-2 PUFFS INTO THE LUNGS EVERY 4 HOURS AS NEEDED FOR WHEEZING OR SHORTNESS OF BREATH.   alendronate 70 MG tablet Commonly known as: FOSAMAX Take with a full glass of water on an empty stomach.    fluorouracil 5 % cream Commonly known as: EFUDEX Apply topically 3 (three) times daily as needed.   Flutter Devi Twice a day and prn as needed, may increase if feeling worse   guaiFENesin 600 MG 12 hr tablet Commonly known as: Mucinex Take 1 tablet (600 mg total) by mouth 2 (two) times daily.   ipratropium 0.06 % nasal spray Commonly known as: ATROVENT Place 2 sprays into both nostrils 4 (four) times daily as needed for rhinitis.   MAGNESIUM PO Take by mouth.   montelukast 10 MG tablet Commonly known as: SINGULAIR TAKE 1 TABLET BY MOUTH EVERYDAY AT BEDTIME   multivitamin tablet Take 1 tablet by mouth daily.   sodium chloride HYPERTONIC 3 % nebulizer solution Take by nebulization 2 (two) times daily as needed for other. Started by: Coralyn Helling   Trelegy Ellipta 100-62.5-25 MCG/ACT Aepb Generic drug: Fluticasone-Umeclidin-Vilant INHALE 1 PUFF BY MOUTH EVERY DAY   tretinoin 0.1 % cream Commonly known as:  RETIN-A Apply topically at bedtime as needed.   valsartan 160 MG tablet Commonly known as: DIOVAN Take 1 tablet (160 mg total) by mouth daily.   VITAMIN D (CHOLECALCIFEROL) PO Take 5,000 Units by mouth daily.        Signature:  Coralyn Helling, MD [facility] Pager - [phone] 11/28/2022, 9:34 AM

## 2022-11-28 NOTE — Patient Instructions (Signed)
Will arrange for SmartVest chest percussion system  Will arrange for sputum cultures  Follow up in 3 months

## 2022-12-01 NOTE — Telephone Encounter (Signed)
Pharmacy comment: Alternative Requested:DO NOT STOCK THIS.

## 2022-12-03 ENCOUNTER — Telehealth: Payer: Self-pay | Admitting: Pulmonary Disease

## 2022-12-03 NOTE — Telephone Encounter (Signed)
ATC X1 LVM  Please advise patient [name] from smart vest has reached out several times patient will need to call them

## 2022-12-09 ENCOUNTER — Encounter: Payer: Self-pay | Admitting: *Deleted

## 2022-12-09 NOTE — Telephone Encounter (Signed)
Can we mail her a letter with the contact information for SmartVest, and see if she will respond to this?

## 2022-12-09 NOTE — Telephone Encounter (Signed)
Letter created and placed in outgoing mail to patient requesting her to contact Tobi Bastos at [employer].  [name]'s contact number with [facility] contact information in letter. Nothing further at this time.

## 2022-12-12 ENCOUNTER — Ambulatory Visit (HOSPITAL_BASED_OUTPATIENT_CLINIC_OR_DEPARTMENT_OTHER)
Admission: RE | Admit: 2022-12-12 | Discharge: 2022-12-12 | Disposition: A | Discharge status: Home / Self Care | Payer: Self-pay | Source: Ambulatory Visit | Attending: Family Medicine | Admitting: Family Medicine

## 2022-12-12 DIAGNOSIS — J479 Bronchiectasis, uncomplicated: Secondary | ICD-10-CM | POA: Diagnosis not present

## 2022-12-12 DIAGNOSIS — I10 Essential (primary) hypertension: Secondary | ICD-10-CM | POA: Insufficient documentation

## 2022-12-12 DIAGNOSIS — R590 Localized enlarged lymph nodes: Secondary | ICD-10-CM | POA: Diagnosis not present

## 2022-12-12 DIAGNOSIS — F17200 Nicotine dependence, unspecified, uncomplicated: Secondary | ICD-10-CM | POA: Insufficient documentation

## 2022-12-12 NOTE — Progress Notes (Signed)
Left pt VM

## 2022-12-12 NOTE — Progress Notes (Signed)
Pt given results  

## 2022-12-18 ENCOUNTER — Other Ambulatory Visit: Payer: Self-pay | Admitting: Family Medicine

## 2022-12-18 ENCOUNTER — Other Ambulatory Visit: Payer: Self-pay | Admitting: Pulmonary Disease

## 2022-12-24 DIAGNOSIS — C001 Malignant neoplasm of external lower lip: Secondary | ICD-10-CM | POA: Diagnosis not present

## 2022-12-24 DIAGNOSIS — Z48817 Encounter for surgical aftercare following surgery on the skin and subcutaneous tissue: Secondary | ICD-10-CM | POA: Diagnosis not present

## 2022-12-25 DIAGNOSIS — Z1231 Encounter for screening mammogram for malignant neoplasm of breast: Secondary | ICD-10-CM | POA: Diagnosis not present

## 2022-12-25 DIAGNOSIS — Z681 Body mass index (BMI) 19 or less, adult: Secondary | ICD-10-CM | POA: Diagnosis not present

## 2022-12-25 DIAGNOSIS — Z01419 Encounter for gynecological examination (general) (routine) without abnormal findings: Secondary | ICD-10-CM | POA: Diagnosis not present

## 2022-12-25 LAB — HM MAMMOGRAPHY

## 2023-01-14 ENCOUNTER — Telehealth: Payer: Self-pay | Admitting: *Deleted

## 2023-01-14 NOTE — Telephone Encounter (Signed)
PT has congestion and green phlegm and fever .Would like Pred and Antibx called in. Her # is [phone]   [facility] [place].

## 2023-01-15 ENCOUNTER — Other Ambulatory Visit: Payer: Self-pay

## 2023-01-15 MED ORDER — ALENDRONATE SODIUM 70 MG PO TABS: ORAL_TABLET | ORAL | 2 refills | Status: DC

## 2023-01-15 NOTE — Telephone Encounter (Signed)
RF request for alendronate (FOSAMAX) 70 MG tablet  LOV: 11/04/22 CPE Next ov: 04/06/23 [facility] Last written: 12/18/22 (4,0)  Refill provided until next appt

## 2023-01-15 NOTE — Telephone Encounter (Signed)
Patient checking on sick message. Pharmacy is [facility]. Patient phone number is [phone] and [phone].

## 2023-01-16 MED ORDER — PREDNISONE 10 MG PO TABS: ORAL_TABLET | ORAL | 0 refills | Status: AC

## 2023-01-16 MED ORDER — LEVOFLOXACIN 500 MG PO TABS: 500.0000 mg | ORAL_TABLET | Freq: Every day | ORAL | 0 refills | Status: DC

## 2023-01-16 NOTE — Telephone Encounter (Signed)
Please send levaquin 500 mg daily for 5 days.  Please send prednisone 10 mg pill >> 3 pills daily for 2 days, 2 pills daily for 2 days, 1 pill daily for 2 days.

## 2023-01-16 NOTE — Telephone Encounter (Signed)
Will send to Dr. Craige Cotta  as this is his patient

## 2023-01-16 NOTE — Telephone Encounter (Signed)
Called pt, medication has been sent to the confirmed pharmacy

## 2023-01-16 NOTE — Telephone Encounter (Signed)
Called pt who states after her last visit with the office, she had covid which was a mild case. States she has been coughing up green phlegm for about a week and states that she has had a low grade temp as well. States she will wake up in the afternoons with a fever. Pt said if her temp gets up to 100.0, she will take advil to help with her fever. Pt also has had complaints of wheezing.  Pt has not tried any OTC cough meds. Pt is taking all meds as directed. Has had to use her albuterol inhaler at least twice a day.  With the weekend coming up, pt is wanting to know if prednisone and abx could be prescribed.  Tammy, please advise.

## 2023-01-27 DIAGNOSIS — D485 Neoplasm of uncertain behavior of skin: Secondary | ICD-10-CM | POA: Diagnosis not present

## 2023-01-27 DIAGNOSIS — L905 Scar conditions and fibrosis of skin: Secondary | ICD-10-CM | POA: Diagnosis not present

## 2023-01-27 DIAGNOSIS — C001 Malignant neoplasm of external lower lip: Secondary | ICD-10-CM | POA: Diagnosis not present

## 2023-02-11 ENCOUNTER — Ambulatory Visit (INDEPENDENT_AMBULATORY_CARE_PROVIDER_SITE_OTHER): Payer: Medicare Other

## 2023-02-11 DIAGNOSIS — Z23 Encounter for immunization: Secondary | ICD-10-CM | POA: Diagnosis not present

## 2023-02-11 NOTE — Progress Notes (Signed)
Pt in for high dose flu vaccine per Dr Claiborne Billings.   Injection tolerated well.

## 2023-02-17 DIAGNOSIS — L905 Scar conditions and fibrosis of skin: Secondary | ICD-10-CM | POA: Diagnosis not present

## 2023-02-17 DIAGNOSIS — Z48817 Encounter for surgical aftercare following surgery on the skin and subcutaneous tissue: Secondary | ICD-10-CM | POA: Diagnosis not present

## 2023-02-23 ENCOUNTER — Telehealth: Payer: Self-pay | Admitting: Acute Care

## 2023-02-23 NOTE — Telephone Encounter (Signed)
Lm x1 for patient.  

## 2023-02-23 NOTE — Telephone Encounter (Signed)
Patient has a fever of 100 and a cough. Very congested and has a headache. She also has green mucus. She would like something to be called in for her sickness.

## 2023-02-24 NOTE — Telephone Encounter (Signed)
Patient is aware of recommendations and voiced her understanding. She will contact PCP.  Nothing further needed.

## 2023-02-24 NOTE — Telephone Encounter (Signed)
PT ret Margie's call.   Pharm: [facility] in [place]  [phone] or [phone]

## 2023-02-24 NOTE — Telephone Encounter (Signed)
Called and spoke to patient.  Temp of 100, chest congestion, some wheezing, prod cough with thick yellow to greenish sputum, chills and sweats x1wk. Course of abx one month ago. Sx improved but did not completely subside.  No recent covid test. She is using albuterol solution BID, mucinex 600mg  BID and trelegy once daily.  Sarah, please advise. Thanks

## 2023-02-25 ENCOUNTER — Encounter: Payer: Self-pay | Admitting: Urgent Care

## 2023-02-25 ENCOUNTER — Other Ambulatory Visit: Payer: Self-pay | Admitting: Family Medicine

## 2023-02-25 ENCOUNTER — Ambulatory Visit: Payer: Medicare Other | Admitting: Urgent Care

## 2023-02-25 ENCOUNTER — Ambulatory Visit: Payer: Medicare Other | Admitting: Family Medicine

## 2023-02-25 ENCOUNTER — Telehealth: Payer: Self-pay | Admitting: Urgent Care

## 2023-02-25 ENCOUNTER — Ambulatory Visit (HOSPITAL_BASED_OUTPATIENT_CLINIC_OR_DEPARTMENT_OTHER)
Admission: RE | Admit: 2023-02-25 | Discharge: 2023-02-25 | Disposition: A | Discharge status: Home / Self Care | Payer: Medicare Other | Source: Ambulatory Visit | Attending: Urgent Care | Admitting: Urgent Care

## 2023-02-25 VITALS — BP 138/89 | HR 98 | Temp 99.5°F | Ht 64.5 in | Wt 106.8 lb

## 2023-02-25 DIAGNOSIS — R110 Nausea: Secondary | ICD-10-CM | POA: Diagnosis not present

## 2023-02-25 DIAGNOSIS — J470 Bronchiectasis with acute lower respiratory infection: Secondary | ICD-10-CM | POA: Diagnosis not present

## 2023-02-25 DIAGNOSIS — R509 Fever, unspecified: Secondary | ICD-10-CM | POA: Insufficient documentation

## 2023-02-25 DIAGNOSIS — R918 Other nonspecific abnormal finding of lung field: Secondary | ICD-10-CM | POA: Diagnosis not present

## 2023-02-25 DIAGNOSIS — R059 Cough, unspecified: Secondary | ICD-10-CM | POA: Diagnosis not present

## 2023-02-25 DIAGNOSIS — J432 Centrilobular emphysema: Secondary | ICD-10-CM

## 2023-02-25 DIAGNOSIS — J189 Pneumonia, unspecified organism: Secondary | ICD-10-CM

## 2023-02-25 LAB — POCT INFLUENZA A/B
Influenza A, POC: NEGATIVE
Influenza B, POC: NEGATIVE

## 2023-02-25 LAB — POC COVID19 BINAXNOW: SARS Coronavirus 2 Ag: NEGATIVE

## 2023-02-25 MED ORDER — AMOXICILLIN-POT CLAVULANATE 875-125 MG PO TABS
1.0000 | ORAL_TABLET | Freq: Two times a day (BID) | ORAL | 0 refills | Status: DC
Start: 2023-02-25 — End: 2023-04-06

## 2023-02-25 MED ORDER — PREDNISONE 10 MG (21) PO TBPK
ORAL_TABLET | Freq: Every day | ORAL | 0 refills | Status: DC
Start: 2023-02-25 — End: 2023-04-06

## 2023-02-25 MED ORDER — AZITHROMYCIN 500 MG PO TABS: 500.0000 mg | ORAL_TABLET | Freq: Every day | ORAL | 0 refills | Status: AC

## 2023-02-25 NOTE — Telephone Encounter (Signed)
Pt contacted by staff regarding CXR results. Pt will be on dual agent abx therapy for community acquired pneumonia - azithro and augmentin. Pt will need to schedule 3-4 week follow up CXR, order placed

## 2023-02-25 NOTE — Assessment & Plan Note (Signed)
Chronic Respiratory Symptoms with recent changes. Persistent cough with color change in sputum and recent fever. History of emphysema and bronchial wall thickening. Recent exposure to potential respiratory irritants during home renovation. No current shortness of breath or chest pain. -Order stat chest x-ray at [facility] to assess for presence of pneumonia -Continue current medications including Trelegy and Singulair daily, and Mucinex 600mg  twice daily. -Start empiric Augmentin BID for suspected COPD exacerbation vs acute bronchiectasis with lower respiratory tract infection. Will add dual abx therapy should xray confirm pneumonia. -prednisone taper pack per package instructions.

## 2023-02-25 NOTE — Patient Instructions (Addendum)
Your symptoms are concerning for pneumonia. Please go to [facility] to obtain a STAT chest xray.  I would like to empirically start you on Augmentin which is a preferred medication for pulmonary infections. If your chest xray indicates pneumonia, we would also start you on a second antibiotic.  Please continue all of your daily medications, specifically your respiratory medications. Continue mucinex with plenty of water.  Please take the prednisone taper pack as prescribed.  Your covid and flu tests are negative.  Alternate 1000mg  tylenol every 8 hours with 800mg  ibuprofen to help control body aches and fevers. You can also consider OTC oscillococcinum which helps with body aches and fatigue associated with infections. This is natural/ herbal and does not require a prescription.  Please contact our office should your symptoms persist >5 days despite medical treatment.

## 2023-02-25 NOTE — Progress Notes (Signed)
Established Patient Office Visit  Subjective:  [name], female    DOB: 01/05/1956  Age: 67 y.o. MRN: [id number]  Chief Complaint  Patient presents with   Cough    Pt also c/o fever, chest congestion, productive cough with yellow or green phlegm x , has tried taking Mucinex OTC.    67yo female with a history of moderate to severe centralobular emphysema and bronchial wall thickening/ bronchiectasis, presents with a persistent cough and changes in sputum color. She reports a month-long history of coughing, initially with green sputum, which changed back to clear. She also reports a recent onset of fever starting approximately five days ago, with the highest recorded temperature being 100 degrees Fahrenheit. The patient denies experiencing chest pain but reports occasional feelings of nausea, which they attribute to fever and swallowing sputum.  The patient completed a course of Levaquin and prednisone about a month ago, prescribed by a nurse practitioner via a virtual consultation. They report that the Levaquin changed the color of their sputum and resolved their fever, but the cough persisted. The patient uses a nebulizer and handheld inhaler for symptom management, but not regularly due to side effects. They also take Singulair and Mucinex daily, as advised by their pulmonologist.  The patient has been involved in home refurbishment activities, including pulling up old carpet, which they believe may have exacerbated their respiratory symptoms. They deny exposure to water damage or other potential inhalants during these activities. The patient denies experiencing abdominal pain, vomiting, or diarrhea. They have been swabbed for COVID and flu as part of the current consultation.   Cough      ROS: as noted in HPI  Objective:     BP 138/89   Pulse 98   Temp 99.5 F (37.5 C) (Oral)   Ht 5' 4.5" (1.638 m)   Wt 106 lb 12 oz (48.4 kg)   SpO2 94%   BMI 18.04 kg/m     Physical Exam Vitals and nursing note reviewed.  Constitutional:      General: She is not in acute distress.    Appearance: Normal appearance. She is not ill-appearing, toxic-appearing or diaphoretic.  HENT:     Head: Normocephalic and atraumatic.     Salivary Glands: Right salivary gland is not diffusely enlarged or tender. Left salivary gland is not diffusely enlarged or tender.     Right Ear: Tympanic membrane, ear canal and external ear normal. No drainage, swelling or tenderness. No middle ear effusion. There is no impacted cerumen. No mastoid tenderness. Tympanic membrane is not injected, scarred, perforated, erythematous or bulging.     Left Ear: Ear canal and external ear normal. No drainage, swelling or tenderness.  No middle ear effusion. There is no impacted cerumen. No mastoid tenderness. Tympanic membrane is injected. Tympanic membrane is not scarred, perforated, erythematous or bulging.     Nose: Congestion and rhinorrhea present.     Right Turbinates: Swollen. Not enlarged.     Left Turbinates: Swollen. Not enlarged.     Right Sinus: No maxillary sinus tenderness or frontal sinus tenderness.     Left Sinus: No maxillary sinus tenderness or frontal sinus tenderness.     Mouth/Throat:     Mouth: Mucous membranes are moist.     Pharynx: Oropharynx is clear. Uvula midline. Posterior oropharyngeal erythema present. No pharyngeal swelling, oropharyngeal exudate or uvula swelling.  Cardiovascular:     Rate and Rhythm: Normal rate and regular rhythm.  Pulmonary:  Effort: Pulmonary effort is normal. No respiratory distress.     Breath sounds: No stridor. Wheezing (primarily to RLL posterior lung fields only) present. No rales.  Chest:     Chest wall: No tenderness.  Musculoskeletal:     Cervical back: Normal range of motion. No rigidity or tenderness.  Lymphadenopathy:     Cervical: No cervical adenopathy.  Skin:    General: Skin is warm and dry.     Coloration: Skin is  not jaundiced.     Findings: No erythema or rash.  Neurological:     General: No focal deficit present.     Mental Status: She is alert and oriented to person, place, and time.      Results for orders placed or performed in visit on 02/25/23  POCT Influenza A/B  Result Value Ref Range   Influenza A, POC Negative Negative   Influenza B, POC Negative Negative  POC COVID-19 BinaxNow  Result Value Ref Range   SARS Coronavirus 2 Ag Negative Negative      The 10-year ASCVD risk score (Arnett DK, et al., 2019) is: 14.2%  Assessment & Plan:  Centrilobular emphysema (HCC) Assessment & Plan: Chronic Respiratory Symptoms with recent changes. Persistent cough with color change in sputum and recent fever. History of emphysema and bronchial wall thickening. Recent exposure to potential respiratory irritants during home renovation. No current shortness of breath or chest pain. -Order stat chest x-ray at [facility] to assess for presence of pneumonia -Continue current medications including Trelegy and Singulair daily, and Mucinex 600mg  twice daily. Use nebulizer every 4-6 hours around the clock as tolerated.  -Start empiric Augmentin BID for suspected COPD exacerbation vs acute bronchiectasis with lower respiratory tract infection. Will add dual abx therapy should xray confirm pneumonia. -prednisone taper pack per package instructions.  Orders: -     POCT Influenza A/B -     POC COVID-19 BinaxNow -     DG Chest 2 View; Future -     Amoxicillin-Pot Clavulanate; Take 1 tablet by mouth 2 (two) times daily.  Dispense: 20 tablet; Refill: 0 -     predniSONE; Take by mouth daily. Take 6 tabs by mouth daily  for 1 days, then 5 tabs for 1 days, then 4 tabs for 1 days, then 3 tabs for 1 days, 2 tabs for 1 days, then 1 tab by mouth daily for 1 days  Dispense: 21 tablet; Refill: 0  Bronchiectasis with acute lower respiratory infection (HCC) -     POCT Influenza A/B -     POC COVID-19  BinaxNow -     DG Chest 2 View; Future -     Amoxicillin-Pot Clavulanate; Take 1 tablet by mouth 2 (two) times daily.  Dispense: 20 tablet; Refill: 0 -     predniSONE; Take by mouth daily. Take 6 tabs by mouth daily  for 1 days, then 5 tabs for 1 days, then 4 tabs for 1 days, then 3 tabs for 1 days, 2 tabs for 1 days, then 1 tab by mouth daily for 1 days  Dispense: 21 tablet; Refill: 0  Fever, unspecified fever cause -     POCT Influenza A/B -     POC COVID-19 BinaxNow -     DG Chest 2 View; Future  Nausea Possible post-nasal drip and fever related. No abdominal pain, vomiting, or diarrhea reported. -Monitor and report any worsening or additional gastrointestinal symptoms.    No follow-ups on file.  Maretta Bees, PA

## 2023-03-10 ENCOUNTER — Telehealth: Payer: Self-pay | Admitting: Acute Care

## 2023-03-10 DIAGNOSIS — K080 Exfoliation of teeth due to systemic causes: Secondary | ICD-10-CM | POA: Diagnosis not present

## 2023-03-10 NOTE — Telephone Encounter (Signed)
Returning missed call from [facility]

## 2023-03-11 DIAGNOSIS — L570 Actinic keratosis: Secondary | ICD-10-CM | POA: Diagnosis not present

## 2023-03-11 DIAGNOSIS — Z48817 Encounter for surgical aftercare following surgery on the skin and subcutaneous tissue: Secondary | ICD-10-CM | POA: Diagnosis not present

## 2023-03-19 NOTE — Telephone Encounter (Signed)
Lm x1 for patient.  

## 2023-03-19 NOTE — Telephone Encounter (Signed)
Unsure for reason of call. Closing encounter

## 2023-03-23 ENCOUNTER — Other Ambulatory Visit: Payer: Self-pay | Admitting: Family Medicine

## 2023-04-06 ENCOUNTER — Ambulatory Visit (INDEPENDENT_AMBULATORY_CARE_PROVIDER_SITE_OTHER): Payer: Medicare Other | Admitting: Family Medicine

## 2023-04-06 VITALS — BP 132/84 | HR 77 | Temp 97.6°F | Wt 110.4 lb

## 2023-04-06 DIAGNOSIS — M818 Other osteoporosis without current pathological fracture: Secondary | ICD-10-CM

## 2023-04-06 DIAGNOSIS — J418 Mixed simple and mucopurulent chronic bronchitis: Secondary | ICD-10-CM | POA: Diagnosis not present

## 2023-04-06 DIAGNOSIS — I10 Essential (primary) hypertension: Secondary | ICD-10-CM | POA: Diagnosis not present

## 2023-04-06 DIAGNOSIS — D751 Secondary polycythemia: Secondary | ICD-10-CM | POA: Diagnosis not present

## 2023-04-06 MED ORDER — ALENDRONATE SODIUM 70 MG PO TABS: ORAL_TABLET | ORAL | 3 refills | Status: DC

## 2023-04-06 MED ORDER — VALSARTAN 160 MG PO TABS: 240.0000 mg | ORAL_TABLET | Freq: Every day | ORAL | 1 refills | Status: DC

## 2023-04-06 NOTE — Progress Notes (Signed)
[name], female  DOB: February 27, 1956, 67 y.o.   MRN: [id number] Patient Care Team    Relationship Specialty Notifications Start End  Natalia Leatherwood, DO PCP - General Family Medicine  04/23/15   Josph Macho, MD Consulting Physician Oncology  05/12/13   Coralyn Helling, MD (Inactive) Consulting Physician Pulmonary Disease  02/22/16   Jethro Bolus, MD (Inactive) Consulting Physician Urology  04/21/16   Hollace Kinnier, PA-C  Dermatology  07/04/16    Comment: [facility], Clifton Custard, DO Consulting Physician Sports Medicine  05/17/18   Plonk, Lorne Skeens, PA-C  Physician Assistant  05/25/18   Zelphia Cairo, MD Consulting Physician Obstetrics and Gynecology  12/30/21     Chief Complaint  Patient presents with   Hypertension    Subjective: [name] is a 67 y.o.  Female  present for Chronic Conditions/illness Management  All past medical history, surgical history, allergies, family history, immunizations, medications and social history were updated in the electronic medical record today. All recent labs, ED visits and hospitalizations within the last year were reviewed.  Primary hypertension/family history of heart disease Pt reports compliance with valsartan 160 mg daily.  Patient denies chest pain, shortness of breath, dizziness or lower extremity edema.  Pt takes a  daily baby ASA. Pt is not prescribed statin. RF: htn, smoker, fhx heart disease.    Osteoporosis without current pathological fracture, unspecified osteoporosis type/Current every day smoker Patient last DEXA 08/2020-osteoporosis, T-score -3.6. She has been on Fosamax weekly dosing and is tolerating.  She is taking her vitamin D supplement   COPD /pulmonary nodules/lesions, multiple/Abnormal CT scan of lung Follows with pulm routinely      10/16/2022    2:06 PM 07/15/2021    8:47 AM 06/12/2021    2:36 PM 06/27/2020    3:18 PM 11/21/2019    9:42 AM   Depression screen PHQ 2/9  Decreased Interest 0 0 0 0 0  Down, Depressed, Hopeless 0 0 0 0 0  PHQ - 2 Score 0 0 0 0 0  Altered sleeping 0      Tired, decreased energy 1      Change in appetite 1      Feeling bad or failure about yourself  0      Trouble concentrating 0      Moving slowly or fidgety/restless 0      Suicidal thoughts 0      PHQ-9 Score 2          10/16/2022    2:06 PM  GAD 7 : Generalized Anxiety Score  Nervous, Anxious, on Edge 0  Control/stop worrying 0  Worry too much - different things 0  Trouble relaxing 0  Restless 0  Easily annoyed or irritable 0  Afraid - awful might happen 0  Total GAD 7 Score 0     Immunization History  Administered Date(s) Administered   Fluad Trivalent(High Dose 65+) 02/11/2023   Influenza Inj Mdck Quad Pf 02/28/2020   Influenza Split 04/09/2011   Influenza Whole 02/02/2009   Influenza,inj,Quad PF,6+ Mos 01/26/2014, 02/06/2017, 03/04/2018, 01/26/2019, 01/14/2021   Influenza-Unspecified 02/02/2013, 02/24/2015, 04/03/2016   Moderna Sars-Covid-2 Vaccination 07/19/2019, 08/16/2019   PNEUMOCOCCAL CONJUGATE-20 07/15/2021   Pneumococcal Polysaccharide-23 01/26/2019   Tdap 03/04/2023   Zoster Recombinant(Shingrix) 03/04/2018, 01/14/2021    Past Medical History:  Diagnosis Date   Acute bursitis of right shoulder 09/16/2017   Injected in November 13, 2017   Acute lateral meniscal tear 09/09/2017   Arthritis    Avulsion fracture of lateral malleolus of right fibula 07/20/2018   Basal cell carcinoma    COPD (chronic obstructive pulmonary disease) (HCC)    DVT (deep venous thrombosis) (HCC)    Essential hypertension 07/27/2020   Hematuria 06/07/2015   07/2015: [facility] cysto and urine studies  09/2015: ", cystoscopy and urine cytology negative for concerns, benign bladder neck polyp, grade 1 trabeculation, benign microhematuria. RTC PRN.      History of acute bronchitis 03/24/2011   Kidney stones    Lymphadenopathy, abdominal  03/2013   Noted on abd/pelv CT done to eval slow-to-resolve pyelo   Meniere disorder 01/23/2012   Osteoporosis 12/2014   Dr. Jennette Kettle, her GYN, dx'd her   Pneumonia    Pyelonephritis 10/28/2017   Recurrent pyelonephritis    Seasonal allergic rhinitis    Squamous cell carcinoma in situ (SCCIS) of skin of lower leg 05/04/2018   Dr. Glade Stanford, [facility]   Squamous cell carcinoma in situ (SCCIS) of skin of lower leg 05/04/2018   Dr. Glade Stanford, [facility]  Invasive Squamous cell carcinoma margins of excision appear free of tumor in the sections examined.  Left Lateral Leg: Superficially invasive squamous cell carcinoma, extending to the deep margin.    No Known Allergies Past Surgical History:  Procedure Laterality Date   DEXA  12/2014   T score spine -2.4, hip -3.5 (Dr. Jennette Kettle, [facility].   DILATION AND CURETTAGE OF UTERUS  2009   SKIN LESION EXCISION Left 05/24/2018   SCC; Skin, Excision, Left Laeral leg: postsurgical scar, no neoplasm is identified.  Skin, Shave bx and ED&C, right anterior thigh-Superficially invasive squamous cell carcinoma, extending to the deep margin.   Family History  Problem Relation Age of Onset   Hypertension Mother    Glaucoma Mother    Irritable bowel syndrome Mother    Arthritis Mother    Kidney disease Mother    Diabetes Mother    Hypertension Father    Heart disease Father        bypasses   Cancer Father        sarcoma   Colon cancer Paternal Aunt 43   Heart disease Maternal Grandmother        CHF   Stroke Maternal Grandmother        in his 16's   Breast cancer Maternal Grandmother    Stroke Maternal Grandfather    Other Paternal Grandmother        hardening of the arteries   Heart attack Paternal Grandfather    Stomach cancer Neg Hx    Social History   Social History Narrative   Not on file    Allergies as of 04/06/2023   No Known Allergies      Medication List        Accurate  as of April 06, 2023 10:23 AM. If you have any questions, ask your nurse or doctor.          STOP taking these medications    amoxicillin-clavulanate 875-125 MG tablet Commonly known as: AUGMENTIN Stopped by: [name]   predniSONE 10 MG (21) Tbpk tablet Commonly known as: STERAPRED UNI-PAK 21 TAB Stopped by: [name]       TAKE these medications    AeroChamber MV inhaler Use as instructed   albuterol (2.5 MG/3ML) 0.083% nebulizer solution Commonly known as: PROVENTIL Take 3 mLs (2.5  mg total) by nebulization every 6 (six) hours as needed for wheezing or shortness of breath.   albuterol 108 (90 Base) MCG/ACT inhaler Commonly known as: VENTOLIN HFA Inhale 2 puffs into the lungs every 6 (six) hours as needed for wheezing or shortness of breath. INHALE 1-2 PUFFS INTO THE LUNGS EVERY 4 HOURS AS NEEDED FOR WHEEZING OR SHORTNESS OF BREATH.   alendronate 70 MG tablet Commonly known as: FOSAMAX TAKE ONE TABLET BY MOUTH ONCE A WEEK WITH A FULL GLASS OF WATER ON AN EMPTY STOMACH.   fluorouracil 5 % cream Commonly known as: EFUDEX Apply topically 3 (three) times daily as needed.   Flutter Devi Twice a day and prn as needed, may increase if feeling worse   guaiFENesin 600 MG 12 hr tablet Commonly known as: Mucinex Take 1 tablet (600 mg total) by mouth 2 (two) times daily.   ipratropium 0.06 % nasal spray Commonly known as: ATROVENT Place 2 sprays into both nostrils 4 (four) times daily as needed for rhinitis.   MAGNESIUM PO Take by mouth as needed.   montelukast 10 MG tablet Commonly known as: SINGULAIR TAKE 1 TABLET BY MOUTH EVERYDAY AT BEDTIME   multivitamin tablet Take 1 tablet by mouth daily.   sodium chloride HYPERTONIC 3 % nebulizer solution TAKE BY NEBULIZATION 2 (TWO) TIMES DAILY AS NEEDED FOR OTHER.   Trelegy Ellipta 100-62.5-25 MCG/ACT Aepb Generic drug: Fluticasone-Umeclidin-Vilant INHALE 1 PUFF BY MOUTH EVERY DAY   tretinoin 0.1 %  cream Commonly known as: RETIN-A Apply topically at bedtime as needed.   valsartan 160 MG tablet Commonly known as: DIOVAN Take 1.5 tablets (240 mg total) by mouth daily. What changed: how much to take Changed by: [name]   VITAMIN D (CHOLECALCIFEROL) PO Take 5,000 Units by mouth daily.        All past medical history, surgical history, allergies, family history, immunizations andmedications were updated in the EMR today and reviewed under the history and medication portions of their EMR.      ROS 14 pt review of systems performed and negative (unless mentioned in an HPI)  Objective: BP 132/84   Pulse 77   Temp 97.6 F (36.4 C)   Wt 110 lb 6.4 oz (50.1 kg)   SpO2 96%   BMI 18.66 kg/m  Physical Exam Vitals and nursing note reviewed.  Constitutional:      General: She is not in acute distress.    Appearance: Normal appearance. She is not ill-appearing, toxic-appearing or diaphoretic.  HENT:     Head: Normocephalic and atraumatic.  Eyes:     General: No scleral icterus.       Right eye: No discharge.        Left eye: No discharge.     Extraocular Movements: Extraocular movements intact.     Conjunctiva/sclera: Conjunctivae normal.     Pupils: Pupils are equal, round, and reactive to light.  Cardiovascular:     Rate and Rhythm: Normal rate and regular rhythm.  Pulmonary:     Effort: Pulmonary effort is normal. No respiratory distress.     Breath sounds: Normal breath sounds. No wheezing, rhonchi or rales.  Musculoskeletal:     Right lower leg: No edema.     Left lower leg: No edema.  Skin:    General: Skin is warm.     Findings: No rash.  Neurological:     Mental Status: She is alert and oriented to person, place, and time. Mental status is at baseline.  Motor: No weakness.     Gait: Gait normal.  Psychiatric:        Mood and Affect: Mood normal.        Behavior: Behavior normal.        Thought Content: Thought content normal.        Judgment:  Judgment normal.      No results found.  Assessment/plan: CRYSTALINA AVALLONE is a 67 y.o. female present for chronic condition management Primary hypertension/family history of heart disease increase valsartan to 240 mg every day. Cardiac CT screen: 2024- zero Smoking session Labs:UTD   Osteoporosis without current pathological fracture, unspecified osteoporosis type/Current every day smoker Continue fosamax weekly - Vitamin D UTD -DEXA ordered by GYN   COPD/pulmonary nodules/lesions, multiple/Abnormal CT scan of lung Follows with pulm. Continue follow-up with pulm for management of COPD/inhalers.    Return in about 24 weeks (around 09/21/2023) for Routine chronic condition follow-up.   No orders of the defined types were placed in this encounter.  Meds ordered this encounter  Medications   alendronate (FOSAMAX) 70 MG tablet    Sig: TAKE ONE TABLET BY MOUTH ONCE A WEEK WITH A FULL GLASS OF WATER ON AN EMPTY STOMACH.    Dispense:  12 tablet    Refill:  3   valsartan (DIOVAN) 160 MG tablet    Sig: Take 1.5 tablets (240 mg total) by mouth daily.    Dispense:  135 tablet    Refill:  1   Referral Orders  No referral(s) requested today     Electronically signed by: Kathleen Pacini, DO [facility] [place]

## 2023-04-07 ENCOUNTER — Telehealth: Payer: Self-pay | Admitting: Acute Care

## 2023-04-07 NOTE — Telephone Encounter (Signed)
[name] would like to change the CT scan to High Resolution Chest. [name] phone number is [phone].

## 2023-04-08 ENCOUNTER — Other Ambulatory Visit: Payer: Self-pay | Admitting: *Deleted

## 2023-04-08 ENCOUNTER — Ambulatory Visit (HOSPITAL_BASED_OUTPATIENT_CLINIC_OR_DEPARTMENT_OTHER)
Admission: RE | Admit: 2023-04-08 | Discharge: 2023-04-08 | Disposition: A | Discharge status: Home / Self Care | Payer: Medicare Other | Source: Ambulatory Visit | Attending: Acute Care | Admitting: Acute Care

## 2023-04-08 ENCOUNTER — Ambulatory Visit (HOSPITAL_BASED_OUTPATIENT_CLINIC_OR_DEPARTMENT_OTHER): Payer: Medicare Other

## 2023-04-08 DIAGNOSIS — R918 Other nonspecific abnormal finding of lung field: Secondary | ICD-10-CM | POA: Diagnosis not present

## 2023-04-08 DIAGNOSIS — R911 Solitary pulmonary nodule: Secondary | ICD-10-CM | POA: Diagnosis not present

## 2023-04-08 DIAGNOSIS — J432 Centrilobular emphysema: Secondary | ICD-10-CM | POA: Diagnosis not present

## 2023-04-08 NOTE — Telephone Encounter (Signed)
This was done today at [facility]

## 2023-05-01 DIAGNOSIS — M79672 Pain in left foot: Secondary | ICD-10-CM | POA: Diagnosis not present

## 2023-05-04 ENCOUNTER — Telehealth: Payer: Self-pay | Admitting: Urgent Care

## 2023-05-04 ENCOUNTER — Ambulatory Visit (INDEPENDENT_AMBULATORY_CARE_PROVIDER_SITE_OTHER): Payer: Medicare Other | Admitting: Urgent Care

## 2023-05-04 ENCOUNTER — Telehealth: Payer: Self-pay | Admitting: Acute Care

## 2023-05-04 ENCOUNTER — Ambulatory Visit: Payer: Medicare Other | Admitting: Family Medicine

## 2023-05-04 ENCOUNTER — Encounter: Payer: Self-pay | Admitting: Urgent Care

## 2023-05-04 VITALS — BP 167/97 | HR 97 | Temp 98.5°F | Wt 108.8 lb

## 2023-05-04 DIAGNOSIS — R053 Chronic cough: Secondary | ICD-10-CM

## 2023-05-04 DIAGNOSIS — J441 Chronic obstructive pulmonary disease with (acute) exacerbation: Secondary | ICD-10-CM

## 2023-05-04 DIAGNOSIS — R590 Localized enlarged lymph nodes: Secondary | ICD-10-CM | POA: Diagnosis not present

## 2023-05-04 DIAGNOSIS — Z2089 Contact with and (suspected) exposure to other communicable diseases: Secondary | ICD-10-CM | POA: Diagnosis not present

## 2023-05-04 DIAGNOSIS — Z87891 Personal history of nicotine dependence: Secondary | ICD-10-CM

## 2023-05-04 DIAGNOSIS — F17210 Nicotine dependence, cigarettes, uncomplicated: Secondary | ICD-10-CM

## 2023-05-04 DIAGNOSIS — Z122 Encounter for screening for malignant neoplasm of respiratory organs: Secondary | ICD-10-CM

## 2023-05-04 MED ORDER — DEXAMETHASONE 6 MG PO TABS: 6.0000 mg | ORAL_TABLET | Freq: Two times a day (BID) | ORAL | 0 refills | Status: DC

## 2023-05-04 MED ORDER — AZITHROMYCIN 250 MG PO TABS: ORAL_TABLET | ORAL | 0 refills | Status: DC

## 2023-05-04 MED ORDER — AMOXICILLIN-POT CLAVULANATE 875-125 MG PO TABS: 1.0000 | ORAL_TABLET | Freq: Two times a day (BID) | ORAL | 0 refills | Status: DC

## 2023-05-04 NOTE — Assessment & Plan Note (Signed)
Chronic Obstructive Pulmonary Disease (COPD) exacerbation New onset of dyspnea, difficulty coughing up mucus, and chest congestion. No fever or hemoptysis. Recent CT showed waxing and waning of mucoid impaction and peribranchovascular nodularity indicative of a chronic atypical infectious process. -Continue Albuterol nebulizer every 4-6 hours as tolerated. -Start Augmentin and Azithromycin to cover potential acute bacterial and atypical infections. -Discontinue current Medrol regimen and start Dexamethasone for more potent anti-inflammatory effect.

## 2023-05-04 NOTE — Telephone Encounter (Signed)
Called [place] pulmonology to discuss workup for CT scan, particularly given pt's current symptoms today. Suspect bronchoscopy preferred however pt states she would prefer to do sputum cultures. I placed order for sputum culture and acid-fast bacili, however was notified by lab tech that we dont have the capability of performing/ obtaining these in office through primary care. Called and spoke with nurse to leave message for Kandice Robinsons, NP at [facility] to see if they would possibly be willing and able to order the correct tests and have pt drop sputum culture off there for appropriate and proper processing. Awaiting return call back. WLC, PA-C 1:15pm

## 2023-05-04 NOTE — Progress Notes (Signed)
Established Patient Office Visit  Subjective:  [name], female    DOB: Jun 06, 1955  Age: 67 y.o. MRN: [id number]  Chief Complaint  Patient presents with   Nasal Congestion    Chest congestion for a few weeks now along with runny nose. When she does cough she brings up a yellowish mucous.    Discussed the use of AI software for clinical note transcription with the patient who gave verbal consent to proceed.   History of Present Illness The patient, with a history of chronic bronchitis and emphysema, presents with new onset respiratory symptoms starting the day prior to the consultation. She describes a sensation of chest tightness and difficulty taking deep breaths, likening it to a feeling of congestion. The patient reports producing thick, creamy to greenish sputum, sometimes tinted, but denies any dark brown or bloody sputum. She denies any associated fever, but notes she has been on Advil and a small dose of medrol for a foot condition.  The patient has a history of annual CT scans for monitoring her chronic lung condition. The most recent scan, conducted a month prior to the current symptoms (04/08/23), showed waxing and waning of mucoid impaction and peribranchovascular nodularity, indicative of a chronic atypical infectious process. The patient recalls a phone discussion about the results with her pulmonologist, mentioning the possibility of a Mycobacterium avium complex (MAC) infection. However, no definitive diagnostic steps or treatment plans for the MAC infection were established at that time.  The patient also mentions recent exposure to family members with colds and walking pneumonia, as well as a gastrointestinal illness that circulated through the family. She has been managing her chronic lung condition with albuterol nebulizer treatments, typically taken every six hours, and a medication called Trelegy. She also reports taking a dose of medrol 4mg  for her foot  condition on the day of the consultation with no improvement to her respiratory status.    Patient Active Problem List   Diagnosis Date Noted   Trigger point of left shoulder region 06/12/2021   Polycythemia 07/30/2020   Essential hypertension 07/27/2020   Family history of heart disease 07/27/2020   Bronchiectasis with acute lower respiratory infection (HCC) 12/20/2019   Nocturnal hypoxemia due to emphysema (HCC) 08/10/2018   Nonallopathic lesion of lumbosacral region 01/12/2018   Nonallopathic lesion of sacral region 01/12/2018   Nonallopathic lesion of thoracic region 01/12/2018   Nonallopathic lesion of rib cage 01/12/2018   Nonallopathic lesion of cervical region 01/12/2018   Osteoporosis without current pathological fracture 10/28/2017   Pulmonary nodules/lesions, multiple 04/23/2016   Mediastinal adenopathy 02/22/2016   Current every day smoker 02/22/2016   Chronic cough 06/07/2015   COPD exacerbation (HCC) 07/26/2014   Meniere disorder 01/23/2012   COPD (chronic obstructive pulmonary disease) (HCC) 12/04/2008   Past Medical History:  Diagnosis Date   Acute bursitis of right shoulder 09/16/2017   Injected in November 13, 2017   Acute lateral meniscal tear 09/09/2017   Arthritis    Avulsion fracture of lateral malleolus of right fibula 07/20/2018   Basal cell carcinoma    COPD (chronic obstructive pulmonary disease) (HCC)    DVT (deep venous thrombosis) (HCC)    Essential hypertension 07/27/2020   Hematuria 06/07/2015   07/2015: [facility] cysto and urine studies  09/2015: ", cystoscopy and urine cytology negative for concerns, benign bladder neck polyp, grade 1 trabeculation, benign microhematuria. RTC PRN.      History of acute bronchitis 03/24/2011   Kidney stones  Lymphadenopathy, abdominal 03/2013   Noted on abd/pelv CT done to eval slow-to-resolve pyelo   Meniere disorder 01/23/2012   Osteoporosis 12/2014   Dr. Jennette Kettle, her GYN, dx'd her   Pneumonia     Pyelonephritis 10/28/2017   Recurrent pyelonephritis    Seasonal allergic rhinitis    Squamous cell carcinoma in situ (SCCIS) of skin of lower leg 05/04/2018   Dr. Glade Stanford, [facility]   Squamous cell carcinoma in situ (SCCIS) of skin of lower leg 05/04/2018   Dr. Glade Stanford, [facility]  Invasive Squamous cell carcinoma margins of excision appear free of tumor in the sections examined.  Left Lateral Leg: Superficially invasive squamous cell carcinoma, extending to the deep margin.    Social History   Tobacco Use   Smoking status: Every Day    Current packs/day: 1.00    Average packs/day: 1 pack/day for 44.9 years (44.9 ttl pk-yrs)    Types: Cigarettes    Start date: 06/01/1978   Smokeless tobacco: Never   Tobacco comments:    still smoking a pack a day as of 09/18/2020  Vaping Use   Vaping status: Never Used  Substance Use Topics   Alcohol use: Yes    Alcohol/week: 7.0 standard drinks of alcohol    Types: 7 Glasses of wine per week    Comment: 1 per day   Drug use: No      ROS: as noted in HPI  Objective:     BP (!) 167/97   Pulse 97   Temp 98.5 F (36.9 C) (Oral)   Wt 108 lb 12.8 oz (49.4 kg)   SpO2 93%   BMI 18.39 kg/m  BP Readings from Last 3 Encounters:  05/04/23 (!) 167/97  04/06/23 132/84  02/25/23 138/89   Wt Readings from Last 3 Encounters:  05/04/23 108 lb 12.8 oz (49.4 kg)  04/06/23 110 lb 6.4 oz (50.1 kg)  02/25/23 106 lb 12 oz (48.4 kg)      Physical Exam Vitals and nursing note reviewed.  Constitutional:      General: She is not in acute distress.    Appearance: Normal appearance. She is not toxic-appearing or diaphoretic.     Comments: Chronically ill, Cachectic  HENT:     Head: Normocephalic and atraumatic.     Right Ear: Tympanic membrane, ear canal and external ear normal. There is no impacted cerumen.     Left Ear: Tympanic membrane and ear canal normal. There is no impacted cerumen.     Nose: Nose  normal. No congestion or rhinorrhea.     Mouth/Throat:     Mouth: Mucous membranes are moist.     Pharynx: Oropharynx is clear. No oropharyngeal exudate or posterior oropharyngeal erythema.  Eyes:     General: No scleral icterus.       Right eye: No discharge.        Left eye: No discharge.     Extraocular Movements: Extraocular movements intact.     Pupils: Pupils are equal, round, and reactive to light.  Cardiovascular:     Rate and Rhythm: Normal rate and regular rhythm.     Pulses: Normal pulses.  Pulmonary:     Effort: Pulmonary effort is normal. No respiratory distress.     Breath sounds: No stridor. Wheezing (diffuse, anterior and posterior all lung fields) and rhonchi present.  Chest:     Chest wall: No tenderness.  Musculoskeletal:     Cervical back: Normal range  of motion and neck supple. No rigidity or tenderness.  Lymphadenopathy:     Cervical: No cervical adenopathy.  Skin:    General: Skin is warm and dry.     Coloration: Skin is not jaundiced.     Findings: No erythema or rash.  Neurological:     General: No focal deficit present.     Mental Status: She is alert and oriented to person, place, and time.      No results found for any visits on 05/04/23.  Last CBC Lab Results  Component Value Date   WBC 10.3 10/16/2022   HGB 16.3 (H) 10/16/2022   HCT 50.2 (H) 10/16/2022   MCV 99.9 10/16/2022   MCH 32.9 11/04/2018   RDW 14.7 10/16/2022   PLT 205.0 10/16/2022   Last metabolic panel Lab Results  Component Value Date   GLUCOSE 87 11/04/2022   NA 140 11/04/2022   K 4.0 11/04/2022   CL 102 11/04/2022   CO2 28 11/04/2022   BUN 13 11/04/2022   CREATININE 0.89 11/04/2022   GFR 67.48 11/04/2022   CALCIUM 9.3 11/04/2022   PHOS 3.2 06/25/2011   PROT 6.2 11/04/2022   ALBUMIN 3.7 11/04/2022   BILITOT 0.6 11/04/2022   ALKPHOS 61 11/04/2022   AST 27 11/04/2022   ALT 12 11/04/2022   ANIONGAP 11 11/04/2018      The 10-year ASCVD risk score (Arnett DK, et  al., 2019) is: 22.1%  Assessment & Plan:  COPD exacerbation (HCC) Assessment & Plan: Chronic Obstructive Pulmonary Disease (COPD) exacerbation New onset of dyspnea, difficulty coughing up mucus, and chest congestion. No fever or hemoptysis. Recent CT showed waxing and waning of mucoid impaction and peribranchovascular nodularity indicative of a chronic atypical infectious process. -Continue Albuterol nebulizer every 4-6 hours as tolerated. -Start Augmentin and Azithromycin to cover potential acute bacterial and atypical infections. -Discontinue current Medrol regimen and start Dexamethasone for more potent anti-inflammatory effect.  Orders: -     dexAMETHasone; Take 1 tablet (6 mg total) by mouth 2 (two) times daily for 5 days.  Dispense: 10 tablet; Refill: 0 -     Amoxicillin-Pot Clavulanate; Take 1 tablet by mouth 2 (two) times daily with a meal for 7 days.  Dispense: 14 tablet; Refill: 0 -     Azithromycin; Take 2 tablets on day 1, then 1 tablet daily on days 2 through 5  Dispense: 6 tablet; Refill: 0 -     Acid Fast Culture with reflexed sensitivities  Exposure to pneumonia -     Azithromycin; Take 2 tablets on day 1, then 1 tablet daily on days 2 through 5  Dispense: 6 tablet; Refill: 0 -     Acid Fast Culture with reflexed sensitivities  Chronic cough -     Acid Fast Culture with reflexed sensitivities  Mediastinal adenopathy Assessment & Plan: Possible Mycobacterium Avium Complex (MAC) infection Chronic atypical infectious process suggested on recent CT. Patient reports previous discussion of potential MAC infection with pulmonologist. -Order sputum culture and acid-fast smear with culture to evaluate for MAC. -Consider bronchoscopy for definitive diagnosis if sputum cultures are inconclusive or not feasible.     No follow-ups on file.   Maretta Bees, PA

## 2023-05-04 NOTE — Patient Instructions (Signed)
Stop medrol; start dexamethasone.  Take Augmentin twice daily with food, Take azithromycin per package directions.  Contact pulmonology to discuss further workup for possible MAC infection; order placed for sputum cultures, however bronchoscopy likely preferred.  RTC if any new or worsening symptoms develop

## 2023-05-04 NOTE — Telephone Encounter (Signed)
Called and spoke with pt and advised her ct results would be sent to Maralyn Sago to advise on. Pt verbalized understanding. Pt also stated in her triage message she needed a order for sputum cultures but is currently on an abx so she would like to hold off. Sarah please advise on ct

## 2023-05-04 NOTE — Assessment & Plan Note (Signed)
Possible Mycobacterium Avium Complex (MAC) infection Chronic atypical infectious process suggested on recent CT. Patient reports previous discussion of potential MAC infection with pulmonologist. -Order sputum culture and acid-fast smear with culture to evaluate for MAC. -Consider bronchoscopy for definitive diagnosis if sputum cultures are inconclusive or not feasible.

## 2023-05-04 NOTE — Telephone Encounter (Signed)
Whitney states checking on results of CT scan. Also would like our office to order sputum cultures. Whitney phone number is [phone] and [phone].

## 2023-05-08 ENCOUNTER — Telehealth: Payer: Self-pay | Admitting: Family Medicine

## 2023-05-08 ENCOUNTER — Encounter: Payer: Self-pay | Admitting: Urgent Care

## 2023-05-08 ENCOUNTER — Other Ambulatory Visit: Payer: Self-pay | Admitting: Urgent Care

## 2023-05-08 MED ORDER — LEVOFLOXACIN 750 MG PO TABS: 750.0000 mg | ORAL_TABLET | Freq: Every day | ORAL | 0 refills | Status: DC

## 2023-05-08 NOTE — Telephone Encounter (Signed)
 Copied from CRM [id number]. Topic: Clinical - Medication Question >> May 08, 2023  8:21 AM Rolin D wrote: Reason for CRM: Patient stated that she was in on 12/30 and was prescribed medications for Chest congestion.Patient stated that none of the medications are working and would like to know if there is anything else that can be prescribed to her. I also tried scheduling an appointment for the patient today to see Dr.Kuneff but her next availability isnt until January 7th . Patient denied appointment slot . Patient also advised to reach out to both contact numbers .

## 2023-05-08 NOTE — Telephone Encounter (Signed)
 Please see below and advise. PCP would like to differ to you since she was seen by you.

## 2023-05-08 NOTE — Telephone Encounter (Signed)
 Pt was seen by [name] on 05/04/23: Advised to  Stop medrol; start dexamethasone. Take Augmentin twice daily with food, Take azithromycin per package directions.  Would you like to differ to her? ( Looks like she has been working on [employer] work up. )

## 2023-05-08 NOTE — Telephone Encounter (Signed)
 Send patient seen [name], please send patient message to her for further management. Thanks

## 2023-05-08 NOTE — Telephone Encounter (Signed)
 Please call patient

## 2023-05-11 NOTE — Telephone Encounter (Signed)
 Called and left VM for pt

## 2023-05-11 NOTE — Telephone Encounter (Signed)
 Spoke with the patient to review the results of the HRCT findings, as per Lauraine Lites, NP instructions.  Patient was informed of new PAH and she states she has had some recent issues with her blood pressure but had a new medication added.  She agrees to follow up with Dr. Annella and appt made for March and mailed to her home.  She was also added to the wait list for sooner appt if it becomes available.  She states she had recent pneumonia and is now on Levaquin  a few days and improving.  She has no fever and congestion in lungs now seems clear in color. Advised if worsening she can call us  for acute OV if available, since she has been an established patient previously with Dr. Shellia.  Order placed for annual LDCT.  Patient acknowledged understanding.

## 2023-05-11 NOTE — Telephone Encounter (Signed)
 LVM to call office and review results.

## 2023-07-07 DIAGNOSIS — D045 Carcinoma in situ of skin of trunk: Secondary | ICD-10-CM | POA: Diagnosis not present

## 2023-07-07 DIAGNOSIS — L570 Actinic keratosis: Secondary | ICD-10-CM | POA: Diagnosis not present

## 2023-07-07 DIAGNOSIS — Z85828 Personal history of other malignant neoplasm of skin: Secondary | ICD-10-CM | POA: Diagnosis not present

## 2023-07-07 DIAGNOSIS — L814 Other melanin hyperpigmentation: Secondary | ICD-10-CM | POA: Diagnosis not present

## 2023-07-07 DIAGNOSIS — L821 Other seborrheic keratosis: Secondary | ICD-10-CM | POA: Diagnosis not present

## 2023-07-07 DIAGNOSIS — C44629 Squamous cell carcinoma of skin of left upper limb, including shoulder: Secondary | ICD-10-CM | POA: Diagnosis not present

## 2023-07-07 DIAGNOSIS — D225 Melanocytic nevi of trunk: Secondary | ICD-10-CM | POA: Diagnosis not present

## 2023-07-07 DIAGNOSIS — D485 Neoplasm of uncertain behavior of skin: Secondary | ICD-10-CM | POA: Diagnosis not present

## 2023-07-07 DIAGNOSIS — C44722 Squamous cell carcinoma of skin of right lower limb, including hip: Secondary | ICD-10-CM | POA: Diagnosis not present

## 2023-07-08 ENCOUNTER — Encounter: Payer: Self-pay | Admitting: Pulmonary Disease

## 2023-07-08 ENCOUNTER — Ambulatory Visit: Payer: Medicare Other | Admitting: Pulmonary Disease

## 2023-07-08 VITALS — BP 138/74 | HR 97 | Ht 64.0 in | Wt 107.8 lb

## 2023-07-08 DIAGNOSIS — J418 Mixed simple and mucopurulent chronic bronchitis: Secondary | ICD-10-CM

## 2023-07-08 DIAGNOSIS — F17210 Nicotine dependence, cigarettes, uncomplicated: Secondary | ICD-10-CM | POA: Diagnosis not present

## 2023-07-08 DIAGNOSIS — R918 Other nonspecific abnormal finding of lung field: Secondary | ICD-10-CM

## 2023-07-08 DIAGNOSIS — J449 Chronic obstructive pulmonary disease, unspecified: Secondary | ICD-10-CM | POA: Diagnosis not present

## 2023-07-08 MED ORDER — MONTELUKAST SODIUM 10 MG PO TABS: 10.0000 mg | ORAL_TABLET | Freq: Every day | ORAL | 3 refills | Status: AC

## 2023-07-08 NOTE — Patient Instructions (Signed)
 Nice to meet you today  Lets try new inhaler, Breztri.  2 puffs in the morning and 2 puffs in the evening.  Rinse your mouth with water after every use.  Send me a message in the next 2 to 4 weeks let her know if you think it is more effective, helps her breathing more than the Trelegy.  If so I can prescribe this to replace the Trelegy.  Do not use Trelegy while you are taking the Breztri.  If there is no change, I am okay to stick with the Trelegy at that time.  We should consider in the future a medicine called Ohtuvayre, this is a nebulized medicine, the newest medication out to help with emphysema and COPD.  For a long time we have had nothing new so this is relatively exciting.  Return to clinic in 3 months or sooner as needed with Dr. Judeth Horn

## 2023-07-09 NOTE — Progress Notes (Signed)
 @Patient  ID: [name], female    DOB: [birth year], 68 y.o.   MRN: [id number]  Chief Complaint  Patient presents with   Follow-up    Referring provider: Natalia Leatherwood, DO  HPI:   68 y.o. woman history of COPD whom we are seeing for follow-up of the same.  Multiple prior pulmonary notes reviewed.  Most recent PCP note reviewed.  Establishing care with me today.  Diagnosed with COPD.  Maintained on Trelegy.  Unclear how much it helps.  She lives with dyspnea.  Some congestion at [facility].  Living outside.  Unclear how much the Trelegy is helping.  Notes with exacerbation in the past levofloxacin helped quite a bit.  She is undergoing lung cancer screening.  Recent CT scan stable, ongoing care per Kandice Robinsons, NP.  Discussed trying different halos.  New medicines help with COPD in the future.  Especially given some residual dyspnea.  She expressed understanding.  Questionaires / Pulmonary Flowsheets:   ACT:      No data to display          MMRC: mMRC Dyspnea Scale mMRC Score  03/21/2020 10:00 AM 2    Epworth:      No data to display          Tests:   FENO:  No results found for: "NITRICOXIDE"  PFT:    Latest Ref Rng & Units 08/25/2016   11:03 AM  PFT Results  FVC-Pre L 2.63   FVC-Predicted Pre % 77   FVC-Post L 2.72   FVC-Predicted Post % 80   Pre FEV1/FVC % % 55   Post FEV1/FCV % % 57   FEV1-Pre L 1.45   FEV1-Predicted Pre % 55   FEV1-Post L 1.55   DLCO uncorrected ml/min/mmHg 13.36   DLCO UNC% % 52   DLCO corrected ml/min/mmHg 13.01   DLCO COR %Predicted % 51   DLVA Predicted % 61   TLC L 5.33   TLC % Predicted % 103   RV % Predicted % 125   Personally viewed interpreted as moderate fixed obstruction, COPD, no significant bronchodilator response, lung volumes consistent with air trapping, DLCO moderately reduced  WALK:      No data to display          Imaging: Personally reviewed and as per EMR and discussion in this note No results  found.  Lab Results: Personally reviewed CBC    Component Value Date/Time   WBC 10.3 10/16/2022 1519   RBC 5.03 10/16/2022 1519   HGB 16.3 (H) 10/16/2022 1519   HGB 14.6 06/01/2013 1107   HCT 50.2 (H) 10/16/2022 1519   HCT 44.9 06/01/2013 1107   PLT 205.0 10/16/2022 1519   PLT 199 06/01/2013 1107   MCV 99.9 10/16/2022 1519   MCV 93 06/01/2013 1107   MCH 32.9 11/04/2018 1319   MCHC 32.5 10/16/2022 1519   RDW 14.7 10/16/2022 1519   RDW 13.6 06/01/2013 1107   LYMPHSABS 2.1 07/27/2020 1008   LYMPHSABS 2.5 06/01/2013 1107   MONOABS 0.7 07/27/2020 1008   EOSABS 0.1 07/27/2020 1008   EOSABS 0.0 06/01/2013 1107   BASOSABS 0.1 07/27/2020 1008   BASOSABS 0.0 06/01/2013 1107    BMET    Component Value Date/Time   NA 140 11/04/2022 0825   K 4.0 11/04/2022 0825   CL 102 11/04/2022 0825   CO2 28 11/04/2022 0825   GLUCOSE 87 11/04/2022 0825   BUN 13 11/04/2022 0825   CREATININE 0.89  11/04/2022 0825   CREATININE 0.72 03/18/2013 1212   CALCIUM 9.3 11/04/2022 0825   GFRNONAA >60 11/04/2018 1319   GFRAA >60 11/04/2018 1319    BNP No results found for: "BNP"  ProBNP No results found for: "PROBNP"  Specialty Problems       Pulmonary Problems   COPD (chronic obstructive pulmonary disease) (HCC)   PFT 08/25/16 >> FEV1 1.55 (59%), FEV1% 57, TLC 5.33 (103%), DLCO 52%  CT chest 01/26/17 >> moderate centrilobular emphysema, mild b/l BTX with tree in bud, LUL nodule resolved, stable 7 mm RLL nodule since 2015       COPD exacerbation (HCC)   Sood pulm, pt has not followed up on below study Mild mediastinal lymphadenopathy has progressed minimally from CT from 2010. 2. Left periaortic lymph nodes are not pathologic by size criteria not significant changed from recent CT 03/18/2013. 3. Spleen is normal. 4. Benign hemangioma within the liver. 5. Progressive probable inflammatory or infectious process in the lower lobes and lingula. Patient may benefit from a high-resolution CTA  or bronchoscopy to further evaluate. Consider pulmonology consultation.      Chronic cough   Pulmonary nodules/lesions, multiple   Nocturnal hypoxemia due to emphysema (HCC)   ONO with RA 07/29/18 >> test time 6 hrs 23 min.  Basal SpO2 86%, low SpO2 72%.  Spent 289.2 min with SpO2 < 88%.      Bronchiectasis with acute lower respiratory infection (HCC)    No Known Allergies  Immunization History  Administered Date(s) Administered   Fluad Trivalent(High Dose 65+) 02/11/2023   Influenza Inj Mdck Quad Pf 02/28/2020   Influenza Split 04/09/2011   Influenza Whole 02/02/2009   Influenza,inj,Quad PF,6+ Mos 01/26/2014, 02/06/2017, 03/04/2018, 01/26/2019, 01/14/2021   Influenza-Unspecified 02/02/2013, 02/24/2015, 04/03/2016   Moderna Sars-Covid-2 Vaccination 07/19/2019, 08/16/2019   PNEUMOCOCCAL CONJUGATE-20 07/15/2021   Pneumococcal Polysaccharide-23 01/26/2019   Tdap 03/04/2023   Zoster Recombinant(Shingrix) 03/04/2018, 01/14/2021    Past Medical History:  Diagnosis Date   Acute bursitis of right shoulder 09/16/2017   Injected in November 13, 2017   Acute lateral meniscal tear 09/09/2017   Arthritis    Avulsion fracture of lateral malleolus of right fibula 07/20/2018   Basal cell carcinoma    COPD (chronic obstructive pulmonary disease) (HCC)    DVT (deep venous thrombosis) (HCC)    Essential hypertension 07/27/2020   Hematuria 06/07/2015   07/2015: [facility] cysto and urine studies  09/2015: ", cystoscopy and urine cytology negative for concerns, benign bladder neck polyp, grade 1 trabeculation, benign microhematuria. RTC PRN.      History of acute bronchitis 03/24/2011   Kidney stones    Lymphadenopathy, abdominal 03/2013   Noted on abd/pelv CT done to eval slow-to-resolve pyelo   Meniere disorder 01/23/2012   Osteoporosis 12/2014   Dr. Jennette Kettle, her GYN, dx'd her   Pneumonia    Pyelonephritis 10/28/2017   Recurrent pyelonephritis    Seasonal allergic rhinitis    Squamous  cell carcinoma in situ (SCCIS) of skin of lower leg 05/04/2018   Dr. Glade Stanford, [facility]   Squamous cell carcinoma in situ (SCCIS) of skin of lower leg 05/04/2018   Dr. Glade Stanford, [facility]  Invasive Squamous cell carcinoma margins of excision appear free of tumor in the sections examined.  Left Lateral Leg: Superficially invasive squamous cell carcinoma, extending to the deep margin.     Tobacco History: Social History   Tobacco Use  Smoking Status Every Day  Current packs/day: 1.00   Average packs/day: 1 pack/day for 45.1 years (45.1 ttl pk-yrs)   Types: Cigarettes   Start date: 06/01/1978  Smokeless Tobacco Never  Tobacco Comments   still smoking a pack a day as of 09/18/2020   Ready to quit: Not Answered Counseling given: Not Answered Tobacco comments: still smoking a pack a day as of 09/18/2020   Continue to not smoke  Outpatient Encounter Medications as of 07/08/2023  Medication Sig   albuterol (PROVENTIL) (2.5 MG/3ML) 0.083% nebulizer solution Take 3 mLs (2.5 mg total) by nebulization every 6 (six) hours as needed for wheezing or shortness of breath.   albuterol (VENTOLIN HFA) 108 (90 Base) MCG/ACT inhaler Inhale 2 puffs into the lungs every 6 (six) hours as needed for wheezing or shortness of breath. INHALE 1-2 PUFFS INTO THE LUNGS EVERY 4 HOURS AS NEEDED FOR WHEEZING OR SHORTNESS OF BREATH.   alendronate (FOSAMAX) 70 MG tablet TAKE ONE TABLET BY MOUTH ONCE A WEEK WITH A FULL GLASS OF WATER ON AN EMPTY STOMACH.   fluorouracil (EFUDEX) 5 % cream Apply topically 3 (three) times daily as needed.   guaiFENesin (MUCINEX) 600 MG 12 hr tablet Take 1 tablet (600 mg total) by mouth 2 (two) times daily.   ipratropium (ATROVENT) 0.06 % nasal spray Place 2 sprays into both nostrils 4 (four) times daily as needed for rhinitis.   Multiple Vitamin (MULTIVITAMIN) tablet Take 1 tablet by mouth daily.   Respiratory Therapy Supplies (FLUTTER) DEVI Twice a day  and prn as needed, may increase if feeling worse   sodium chloride HYPERTONIC 3 % nebulizer solution TAKE BY NEBULIZATION 2 (TWO) TIMES DAILY AS NEEDED FOR OTHER.   Spacer/Aero-Holding Chambers (AEROCHAMBER MV) inhaler Use as instructed   TRELEGY ELLIPTA 100-62.5-25 MCG/ACT AEPB INHALE 1 PUFF BY MOUTH EVERY DAY   tretinoin (RETIN-A) 0.1 % cream Apply topically at bedtime as needed.   valsartan (DIOVAN) 160 MG tablet Take 1.5 tablets (240 mg total) by mouth daily.   VITAMIN D, CHOLECALCIFEROL, PO Take 5,000 Units by mouth daily.    [DISCONTINUED] montelukast (SINGULAIR) 10 MG tablet TAKE 1 TABLET BY MOUTH EVERYDAY AT BEDTIME   montelukast (SINGULAIR) 10 MG tablet Take 1 tablet (10 mg total) by mouth at bedtime.   [DISCONTINUED] levofloxacin (LEVAQUIN) 750 MG tablet Take 1 tablet (750 mg total) by mouth daily. (Patient not taking: Reported on 07/08/2023)   [DISCONTINUED] MAGNESIUM PO Take by mouth as needed. (Patient not taking: Reported on 07/08/2023)   No facility-administered encounter medications on file as of 07/08/2023.     Review of Systems  Review of Systems  No chest pain with exertion.  No orthopnea or PND.  Comprehensive review of systems otherwise negative. Physical Exam  BP 138/74 (BP Location: Right Arm, Patient Position: Sitting, Cuff Size: Normal)   Pulse 97   Ht 5\' 4"  (1.626 m)   Wt 107 lb 12.8 oz (48.9 kg)   SpO2 93%   BMI 18.50 kg/m   Wt Readings from Last 5 Encounters:  07/08/23 107 lb 12.8 oz (48.9 kg)  05/04/23 108 lb 12.8 oz (49.4 kg)  04/06/23 110 lb 6.4 oz (50.1 kg)  02/25/23 106 lb 12 oz (48.4 kg)  11/28/22 109 lb 6.4 oz (49.6 kg)    BMI Readings from Last 5 Encounters:  07/08/23 18.50 kg/m  05/04/23 18.39 kg/m  04/06/23 18.66 kg/m  02/25/23 18.04 kg/m  11/28/22 18.49 kg/m     Physical Exam General: Sitting in chair, no acute  distress Eyes: EOMI, no icterus Neck: Supple, no JVP Pulmonary: Distant, clear, normal work of  breathing Cardiovascular: Warm no edema Abdomen: Nondistended, bowel sounds present MSK: No synovitis, no joint effusion Neuro: Normal gait, no weakness Psych: Normal mood, full affect   Assessment & Plan:   Moderate COPD: Based on pulmonary function test 2018.  Still with some significant dyspnea.  Unclear how much Trelegy is helping.  Discussed trying a different delivery method, trial of Breztri via samples.  If more beneficial we will prescribe this.  If not can resume Trelegy thereafter.  Discussed ohtuvayre, considering adding at next visit or in the future.  Notably, good response to levofloxacin for exacerbations in the past, other antibiotics seem less effective.  Tobacco abuse: Encouraged ongoing lung cancer screening.  Repeat CT scan already ordered.   Return in about 3 months (around 10/08/2023) for f/u Dr. Judeth Horn.   Karren Burly, MD 07/09/2023   This appointment required 41 minutes of patient care (this includes precharting, chart review, review of results, face-to-face care, etc.).

## 2023-07-14 DIAGNOSIS — C44722 Squamous cell carcinoma of skin of right lower limb, including hip: Secondary | ICD-10-CM | POA: Diagnosis not present

## 2023-07-23 ENCOUNTER — Telehealth: Payer: Self-pay | Admitting: Pulmonary Disease

## 2023-07-23 MED ORDER — BREZTRI AEROSPHERE 160-9-4.8 MCG/ACT IN AERO: 2.0000 | INHALATION_SPRAY | Freq: Two times a day (BID) | RESPIRATORY_TRACT | 11 refills | Status: AC

## 2023-07-23 NOTE — Telephone Encounter (Signed)
 [name] has been sent to preferred pharmacy.  Pt is aware and voiced her understanding./  Nothing further needed.

## 2023-07-23 NOTE — Telephone Encounter (Signed)
 Patient is calling to let Dr.Hunsucker know that she like the [place] and would like a prescription for it   [facility]

## 2023-07-27 DIAGNOSIS — D045 Carcinoma in situ of skin of trunk: Secondary | ICD-10-CM | POA: Diagnosis not present

## 2023-07-27 DIAGNOSIS — D0462 Carcinoma in situ of skin of left upper limb, including shoulder: Secondary | ICD-10-CM | POA: Diagnosis not present

## 2023-07-27 DIAGNOSIS — C44629 Squamous cell carcinoma of skin of left upper limb, including shoulder: Secondary | ICD-10-CM | POA: Diagnosis not present

## 2023-08-12 ENCOUNTER — Other Ambulatory Visit: Payer: Self-pay | Admitting: Family Medicine

## 2023-08-20 ENCOUNTER — Ambulatory Visit: Payer: Self-pay | Admitting: Pulmonary Disease

## 2023-08-20 MED ORDER — PREDNISONE 10 MG PO TABS: ORAL_TABLET | ORAL | 0 refills | Status: DC

## 2023-08-20 MED ORDER — AZITHROMYCIN 250 MG PO TABS: ORAL_TABLET | ORAL | 0 refills | Status: AC

## 2023-08-20 NOTE — Telephone Encounter (Signed)
 Routing to Tammy as DOD since Dr [employer]ill [employer] is on vacation.

## 2023-08-20 NOTE — Telephone Encounter (Signed)
 COPD exacerbation- May try Zpack and Steroid taper.  Rx sent to pharm  Mucinex DM twice daily as needed for cough and congestion. Fluids and rest. Continue on current maintenance regimen. Please contact office for sooner follow up if symptoms do not improve or worsen or seek emergency care

## 2023-08-20 NOTE — Telephone Encounter (Signed)
 I called and spoke with the pt and notified of response from Tammy, she verbalized understanding. Nothing further needed.

## 2023-08-20 NOTE — Telephone Encounter (Signed)
 Copied from CRM [id number]. Topic: Clinical - Red Word Triage >> Aug 20, 2023  1:43 PM Crist Dominion wrote: Red Word that prompted transfer to Nurse Triage: Worsening cough, wheezing, congestion, and hard to breathe.  TRIAGE SUMMARY NOTE: Pt reporting that she thinks she is having a COPD flare-up for the past week, "very congested, wheezy, harder to breathe," SOB "just comes over me" even at rest, SOB especially with exertion, coughing up "creamy thick yellow" phlegm, pt speaking in full sentences over phone, nurse can hear breathing sounds tight over phone. Pt confirms no chest pain, dizziness, weakness, or anything out of the ordinary for her usual exacerbations. Pt reporting she is using her rescue inhaler 5x/day and gets relief for "short period" each time. Pt reporting unsure if fever since taking dayquil. Pt took pulse ox while on phone with nurse, 93%, her usual is around 94%. Advised pt be examined in next 4 hours, pt requesting HP message be sent to Dr. Marygrace Snellen first to determine if need appt today, pt reporting they just got back from the beach, pt willing to do appt tomorrow. Please call pt back on her mobile phone [phone].   E2C2 Pulmonary Triage - Initial Assessment Questions "Chief Complaint (e.g., cough, sob, wheezing, fever, chills, sweat or additional symptoms) *Go to specific symptom protocol after initial questions. Flare up I think Not severe yet, moderate, not pneumonia yet Very congested, wheezy, just SOB harder to breathe, sometimes just comes over me while at rest, with exertion Not sure if fever No chest pain, dizziness, weakness Wheezing most of the time Coughing up sputum, creamy thick yellow Breathing sounds tight over phone Relief for short period of time with inhaler Going up stairs struggling, but can come down from that on her own Want to check with him first, just got in from the beach  "How long have symptoms been present?" Been a week, thought just  allergies  MEDICINES:   "Have you used any OTC meds to help with symptoms?" Yes If yes, ask "What medications?" Dayquil, mucinex  "Have you used your inhalers/maintenance medication?" Yes If yes, "What medications?" Breztri, rescue inhaler  If inhaler, ask "How many puffs and how often?" Note: Review instructions on medication in the chart. Rescue inhaler 5x/day, more often than every 4 hours sometimes  OXYGEN: "Do you wear supplemental oxygen?" No  "Do you monitor your oxygen levels?" Yes If yes, "What is your reading (oxygen level) today?" 93%  "What is your usual oxygen saturation reading?"  (Note: Pulmonary O2 sats should be 90% or greater) 94-ish%  Reason for Disposition  [1] Longstanding difficulty breathing (e.g., CHF, COPD, emphysema) AND [2] WORSE than normal  Answer Assessment - Initial Assessment Questions 6. CARDIAC HISTORY: "Do you have any history of heart disease?" (e.g., heart attack, angina, bypass surgery, angioplasty)      HTN 7. LUNG HISTORY: "Do you have any history of lung disease?"  (e.g., pulmonary embolus, asthma, emphysema)     significant  Protocols used: Breathing Difficulty-A-AH

## 2023-09-01 ENCOUNTER — Telehealth: Payer: Self-pay

## 2023-09-01 NOTE — Telephone Encounter (Signed)
 Called pt to see how they were feelings after rx sent last week for sickness and sob,lmtcb

## 2023-09-02 ENCOUNTER — Ambulatory Visit: Admitting: Pulmonary Disease

## 2023-09-03 NOTE — Telephone Encounter (Signed)
 Copied from CRM [id number]. Topic: General - Other >> Sep 02, 2023  7:51 AM Margarette Shawl wrote: Reason for CRM:   Pt returned call from clinic([facility]) from yesterday. Reviewed chart and advised they were checking to see how patient was feeling after illness and the prescribed treatment. She said she is feeling better and rescheduled her appt today to 07/22 for her 17-month follow up so that she is following up around the expected time frame.  Rito Chess pt returned your message

## 2023-09-16 DIAGNOSIS — K080 Exfoliation of teeth due to systemic causes: Secondary | ICD-10-CM | POA: Diagnosis not present

## 2023-09-24 ENCOUNTER — Encounter: Payer: Self-pay | Admitting: Urgent Care

## 2023-09-24 ENCOUNTER — Ambulatory Visit (INDEPENDENT_AMBULATORY_CARE_PROVIDER_SITE_OTHER): Admitting: Urgent Care

## 2023-09-24 ENCOUNTER — Ambulatory Visit: Payer: Self-pay

## 2023-09-24 VITALS — BP 173/75 | HR 94 | Wt 106.8 lb

## 2023-09-24 DIAGNOSIS — F17200 Nicotine dependence, unspecified, uncomplicated: Secondary | ICD-10-CM | POA: Diagnosis not present

## 2023-09-24 DIAGNOSIS — I10 Essential (primary) hypertension: Secondary | ICD-10-CM | POA: Diagnosis not present

## 2023-09-24 DIAGNOSIS — R7303 Prediabetes: Secondary | ICD-10-CM

## 2023-09-24 LAB — CBC WITH DIFFERENTIAL/PLATELET
Basophils Absolute: 0.1 10*3/uL (ref 0.0–0.1)
Basophils Relative: 1.3 % (ref 0.0–3.0)
Eosinophils Absolute: 0.1 10*3/uL (ref 0.0–0.7)
Eosinophils Relative: 1.7 % (ref 0.0–5.0)
HCT: 53.7 % — ABNORMAL HIGH (ref 36.0–46.0)
Hemoglobin: 17.5 g/dL — ABNORMAL HIGH (ref 12.0–15.0)
Lymphocytes Relative: 32.7 % (ref 12.0–46.0)
Lymphs Abs: 2.4 10*3/uL (ref 0.7–4.0)
MCHC: 32.7 g/dL (ref 30.0–36.0)
MCV: 97.6 fl (ref 78.0–100.0)
Monocytes Absolute: 0.5 10*3/uL (ref 0.1–1.0)
Monocytes Relative: 6.7 % (ref 3.0–12.0)
Neutro Abs: 4.2 10*3/uL (ref 1.4–7.7)
Neutrophils Relative %: 57.6 % (ref 43.0–77.0)
Platelets: 198 10*3/uL (ref 150.0–400.0)
RBC: 5.5 Mil/uL — ABNORMAL HIGH (ref 3.87–5.11)
RDW: 15.1 % (ref 11.5–15.5)
WBC: 7.3 10*3/uL (ref 4.0–10.5)

## 2023-09-24 LAB — HEMOGLOBIN A1C: Hgb A1c MFr Bld: 6.3 % (ref 4.6–6.5)

## 2023-09-24 LAB — TSH: TSH: 2.64 u[IU]/mL (ref 0.35–5.50)

## 2023-09-24 MED ORDER — VALSARTAN-HYDROCHLOROTHIAZIDE 320-25 MG PO TABS: 1.0000 | ORAL_TABLET | Freq: Every day | ORAL | 3 refills | Status: DC

## 2023-09-24 NOTE — Telephone Encounter (Signed)
 Copied from CRM [id number]. Topic: Clinical - Red Word Triage >> Sep 24, 2023 10:21 AM Kita Perish H wrote: [facility] that prompted transfer to Nurse Triage: Elevated blood pressure 156/103, headache    Chief Complaint: High BP  x 2 weeks , has increased her medication to 2 pills daily - valsartan .  Symptoms: HA - Tylenol  helps Frequency: 2 weeks Pertinent Negatives: Patient denies chest pain Disposition: [] ED /[] Urgent Care (no appt availability in office) / [x] Appointment(In office/virtual)/ []  [facility]/ [] Home Care/ [] Refused Recommended Disposition /[] [facility]/ []  Follow-up with PCP Additional Notes: agrees with appointment.  Reason for Disposition  Systolic BP  >= 180 OR Diastolic >= 110  Answer Assessment - Initial Assessment Questions 1. BLOOD PRESSURE: "What is the blood pressure?" "Did you take at least two measurements 5 minutes apart?"     156/103 2. ONSET: "When did you take your blood pressure?"     Today 3. HOW: "How did you take your blood pressure?" (e.g., automatic home BP monitor, visiting nurse)     Home cuff 4. HISTORY: "Do you have a history of high blood pressure?"     Yes 5. MEDICINES: "Are you taking any medicines for blood pressure?" "Have you missed any doses recently?"     yes 6. OTHER SYMPTOMS: "Do you have any symptoms?" (e.g., blurred vision, chest pain, difficulty breathing, headache, weakness)     HA 7. PREGNANCY: "Is there any chance you are pregnant?" "When was your last menstrual period?"     NO  Protocols used: Blood Pressure - High-A-AH

## 2023-09-24 NOTE — Progress Notes (Signed)
 Established Patient Office Visit  Subjective:  [name], female    DOB: Jul 19, 1955  Age: 68 y.o. MRN: [id number]  Chief Complaint  Patient presents with   Hypertension    Pt has been having elevated Bp for a few weeks now. She has also been having headaches. She has increased the Valsartan  to 2 tablets a day and its still been elevated.    HPI  Discussed the use of AI scribe software for clinical note transcription with the patient, who gave verbal consent to proceed.  History of Present Illness   [name] is a 68 year old female with hypertension who presents with persistent headaches and elevated blood pressure.  She has been experiencing persistent headaches for about a month, prompting her to monitor her blood pressure, which has been consistently elevated with readings such as 166/102 mmHg and 167/90 mmHg. She only checks her blood pressure when she has a headache and does not do it daily.  In response to the elevated readings, she increased her valsartan  dose from one and a half tablets to two tablets daily for the past two weeks, but this adjustment did not lower her blood pressure. Her valsartan  dose was previously adjusted to one and a half tablets per day in December, and her blood pressure was well-controlled until about a month ago.  She denies any recent changes in her life, including stress levels, diet, activity, or sleep, that could account for the change in her blood pressure. She smokes.  She has a history of diverticulitis and has undergone multiple abdominal ultrasounds for this condition. She recalls a renal ultrasound being performed four years ago, but not a renal artery ultrasound. She has had a CT scan of her heart in the past year, which was normal.  She is currently taking valsartan  for her blood pressure and Mucinex  twice a day. She has previously taken hydrochlorothiazide for Meniere's disease, which she tolerated well, but  discontinued when her symptoms improved.  [name] changes in vision associated with her headaches, although she notes difficulty seeing and is due for an eye exam.      Patient Active Problem List   Diagnosis Date Noted   Trigger point of left shoulder region 06/12/2021   Polycythemia 07/30/2020   Essential hypertension 07/27/2020   Family history of heart disease 07/27/2020   Bronchiectasis with acute lower respiratory infection (HCC) 12/20/2019   Nocturnal hypoxemia due to emphysema (HCC) 08/10/2018   Nonallopathic lesion of lumbosacral region 01/12/2018   Nonallopathic lesion of sacral region 01/12/2018   Nonallopathic lesion of thoracic region 01/12/2018   Nonallopathic lesion of rib cage 01/12/2018   Nonallopathic lesion of cervical region 01/12/2018   Osteoporosis without current pathological fracture 10/28/2017   Pulmonary nodules/lesions, multiple 04/23/2016   Mediastinal adenopathy 02/22/2016   Current every day smoker 02/22/2016   Chronic cough 06/07/2015   COPD exacerbation (HCC) 07/26/2014   Meniere disorder 01/23/2012   COPD (chronic obstructive pulmonary disease) (HCC) 12/04/2008   Past Medical History:  Diagnosis Date   Acute bursitis of right shoulder 09/16/2017   Injected in November 13, 2017   Acute lateral meniscal tear 09/09/2017   Arthritis    Avulsion fracture of lateral malleolus of right fibula 07/20/2018   Basal cell carcinoma    COPD (chronic obstructive pulmonary disease) (HCC)    DVT (deep venous thrombosis) (HCC)    Essential hypertension 07/27/2020   Hematuria 06/07/2015   07/2015: [facility] cysto and urine  studies  09/2015: ", cystoscopy and urine cytology negative for concerns, benign bladder neck polyp, grade 1 trabeculation, benign microhematuria. RTC PRN.      History of acute bronchitis 03/24/2011   Kidney stones    Lymphadenopathy, abdominal 03/2013   Noted on abd/pelv CT done to eval slow-to-resolve pyelo   Meniere disorder 01/23/2012    Osteoporosis 12/2014   Dr. Andree Kayser, her GYN, dx'd her   Pneumonia    Pyelonephritis 10/28/2017   Recurrent pyelonephritis    Seasonal allergic rhinitis    Squamous cell carcinoma in situ (SCCIS) of skin of lower leg 05/04/2018   Dr. Juleen [place], [facility]   Squamous cell carcinoma in situ (SCCIS) of skin of lower leg 05/04/2018   Dr. Juleen [place], [facility]  Invasive Squamous cell carcinoma margins of excision appear free of tumor in the sections examined.  Left Lateral Leg: Superficially invasive squamous cell carcinoma, extending to the deep margin.    Past Surgical History:  Procedure Laterality Date   DEXA  12/2014   T score spine -2.4, hip -3.5 (Dr. Andree Kayser, [facility].   DILATION AND CURETTAGE OF UTERUS  2009   SKIN LESION EXCISION Left 05/24/2018   SCC; Skin, Excision, Left Laeral leg: postsurgical scar, [name] neoplasm is identified.  Skin, Shave bx and ED&C, right anterior thigh-Superficially invasive squamous cell carcinoma, extending to the deep margin.   Social History   Tobacco Use   Smoking status: Every Day    Current packs/day: 1.00    Average packs/day: 1 pack/day for 45.3 years (45.3 ttl pk-yrs)    Types: Cigarettes    Start date: 06/01/1978   Smokeless tobacco: Never   Tobacco comments:    still smoking a pack a day as of 09/18/2020  Vaping Use   Vaping status: Never Used  Substance Use Topics   Alcohol use: Yes    Alcohol/week: 7.0 standard drinks of alcohol    Types: 7 Glasses of wine per week    Comment: 1 per day   Drug use: [name]      ROS: as noted in HPI  Objective:     BP (!) 173/75   Pulse 94   Wt 106 lb 12.8 oz (48.4 kg)   SpO2 96%   BMI 18.33 kg/m  BP Readings from Last 3 Encounters:  09/24/23 (!) 173/75  07/08/23 138/74  05/04/23 (!) 167/97   Wt Readings from Last 3 Encounters:  09/24/23 106 lb 12.8 oz (48.4 kg)  07/08/23 107 lb 12.8 oz (48.9 kg)  05/04/23 108 lb 12.8 oz (49.4 kg)       Physical Exam Vitals and nursing note reviewed.  Constitutional:      General: She is not in acute distress.    Appearance: Normal appearance. She is not ill-appearing, toxic-appearing or diaphoretic.  HENT:     Head: Normocephalic and atraumatic.     Right Ear: Tympanic membrane, ear canal and external ear normal. There is [name] impacted cerumen.     Left Ear: Tympanic membrane, ear canal and external ear normal. There is [name] impacted cerumen.     Nose: Nose normal.     Mouth/Throat:     Mouth: Mucous membranes are moist.     Pharynx: Oropharynx is clear. [name] oropharyngeal exudate or posterior oropharyngeal erythema.  Eyes:     General: [name] scleral icterus.       Right eye: [name] discharge.        Left eye:  [name] discharge.     Extraocular Movements: Extraocular movements intact.     Pupils: Pupils are equal, round, and reactive to light.     Funduscopic exam:    Right eye: [name] hemorrhage, exudate, arteriolar narrowing or papilledema.        Left eye: [name] hemorrhage, exudate, arteriolar narrowing or papilledema.  Neck:     Thyroid : [name] thyroid  mass, thyromegaly or thyroid  tenderness.     Vascular: [name] carotid bruit.  Cardiovascular:     Rate and Rhythm: Normal rate and regular rhythm.     Pulses: Normal pulses.     Heart sounds: [name] murmur heard. Pulmonary:     Effort: Pulmonary effort is normal. [name] respiratory distress.     Breath sounds: Normal breath sounds. [name] stridor. [name] rhonchi (chronic posterior).  Abdominal:     General: Abdomen is flat. Bowel sounds are normal. There is [name] distension.     Palpations: Abdomen is soft. There is [name] mass.     Tenderness: There is [name] abdominal tenderness. There is [name] guarding.     Comments: Hyperactive bowel sounds; unable to appreciate bruit  Musculoskeletal:     Cervical back: Normal range of motion and neck supple. [name] rigidity or tenderness.     Right lower leg: [name] edema.     Left lower leg: [name] edema.  Lymphadenopathy:     Cervical: [name] cervical  adenopathy.  Skin:    General: Skin is warm and dry.     Coloration: Skin is not jaundiced.     Findings: [name] bruising, erythema or rash.  Neurological:     General: [name] focal deficit present.     Mental Status: She is alert and oriented to person, place, and time.     Sensory: [name] sensory deficit.     Motor: [name] weakness.  Psychiatric:        Mood and Affect: Mood normal.        Behavior: Behavior normal.     The 10-year ASCVD risk score (Arnett DK, et al., 2019) is: 23.5%  Assessment & Plan:  Accelerated hypertension -     COMPLETE METABOLIC PANEL WITHOUT GFR -     CBC with Differential/Platelet -     TSH -     VAS US  RENAL ARTERY DUPLEX; Future -     Valsartan -hydroCHLOROthiazide; Take 1 tablet by mouth daily.  Dispense: 90 tablet; Refill: 3  Current every day smoker  Prediabetes -     Hemoglobin A1c  Assessment and Plan    Uncontrolled Hypertension Persistent elevated blood pressure despite increased valsartan . Suspected renal artery stenosis or abdominal aortic plaque due to smoking history. - Order ultrasound of abdominal aorta and renal artery. - Prescribe valsartan /hydrochlorothiazide (320 mg/25 mg) combination pill. - Discontinue current valsartan  regimen and start new combination pill. - Advise medication may take 14 days for full effect. - Provide contact for ultrasound scheduling.  Headache Headaches associated with elevated blood pressure, [name] signs of increased intracranial pressure. - Monitor headache symptoms with blood pressure management.   Smoker Cutting back/ cessation advisable.   Prediabetes Last A1C was 6.2% in July 2024. Recheck today to ensure pt is not within DM range        Return in about 2 weeks (around 10/08/2023).   Mandy Second, PA

## 2023-09-24 NOTE — Telephone Encounter (Signed)
 LVM to discuss

## 2023-09-24 NOTE — Patient Instructions (Signed)
 Stop your current valsartan . Instead, start valsartan / hydrochlorothiazide combo pill onces daily. Monitor BP readings.  Return for recheck and lab review in 2 weeks.  [facility] Address: [address], [place], [place] [address] Phone: [phone]   Please call the above number to schedule your renal artery ultrasound.

## 2023-09-25 ENCOUNTER — Ambulatory Visit: Payer: Self-pay | Admitting: Urgent Care

## 2023-09-25 LAB — COMPLETE METABOLIC PANEL WITHOUT GFR
AG Ratio: 1.5 (calc) (ref 1.0–2.5)
ALT: 16 U/L (ref 6–29)
AST: 32 U/L (ref 10–35)
Albumin: 4.1 g/dL (ref 3.6–5.1)
Alkaline phosphatase (APISO): 75 U/L (ref 37–153)
BUN: 13 mg/dL (ref 7–25)
CO2: 31 mmol/L (ref 20–32)
Calcium: 9.2 mg/dL (ref 8.6–10.4)
Chloride: 100 mmol/L (ref 98–110)
Creat: 0.72 mg/dL (ref 0.50–1.05)
Globulin: 2.7 g/dL (ref 1.9–3.7)
Glucose, Bld: 89 mg/dL (ref 65–99)
Potassium: 4 mmol/L (ref 3.5–5.3)
Sodium: 139 mmol/L (ref 135–146)
Total Bilirubin: 0.7 mg/dL (ref 0.2–1.2)
Total Protein: 6.8 g/dL (ref 6.1–8.1)

## 2023-10-09 DIAGNOSIS — H524 Presbyopia: Secondary | ICD-10-CM | POA: Diagnosis not present

## 2023-10-23 ENCOUNTER — Ambulatory Visit (HOSPITAL_COMMUNITY)
Admission: RE | Admit: 2023-10-23 | Discharge: 2023-10-23 | Disposition: A | Discharge status: Home / Self Care | Source: Ambulatory Visit | Attending: Urgent Care | Admitting: Urgent Care

## 2023-10-23 DIAGNOSIS — I10 Essential (primary) hypertension: Secondary | ICD-10-CM

## 2023-11-24 ENCOUNTER — Ambulatory Visit: Admitting: Pulmonary Disease

## 2023-11-24 ENCOUNTER — Telehealth: Payer: Self-pay

## 2023-11-24 ENCOUNTER — Encounter: Payer: Self-pay | Admitting: Pulmonary Disease

## 2023-11-24 VITALS — BP 150/98 | HR 107 | Temp 97.9°F | Ht 64.0 in | Wt 104.5 lb

## 2023-11-24 DIAGNOSIS — J441 Chronic obstructive pulmonary disease with (acute) exacerbation: Secondary | ICD-10-CM

## 2023-11-24 MED ORDER — PREDNISONE 20 MG PO TABS
40.0000 mg | ORAL_TABLET | Freq: Every day | ORAL | 0 refills | Status: AC
Start: 2023-11-24 — End: 2023-11-29

## 2023-11-24 MED ORDER — LEVOFLOXACIN 750 MG PO TABS: 750.0000 mg | ORAL_TABLET | Freq: Every day | ORAL | 0 refills | Status: AC

## 2023-11-24 NOTE — Patient Instructions (Signed)
 Nice to see you again  I sent steroids and antibiotics to help get over the congestion etc. after flying  Continue Breztri   We will fill out paperwork today for Ohtuvayre  - this is the nebulizer we talked about us  if it helps with your symptoms day-to-day but also decrease the frequency of exacerbations or need for prednisone  or antibiotics  Return to clinic in 6 months or sooner as needed with Dr. Annella

## 2023-11-24 NOTE — Progress Notes (Signed)
 @Patient  ID: Veva KATHEE Louder, female    DOB: 1955/11/10, 68 y.o.   MRN: [id number]  Chief Complaint  Patient presents with   Medical Management of Chronic Issues    COPD    Referring provider: Catherine Charlies LABOR, DO  HPI:   68 y.o. woman history of COPD whom we are seeing for follow-up of the same.  Multiple notes from telephone encounters from our office reviewed in interim.  Overall doing well.  Recently returned from a trip to [place] .  Had trouble with inclines etc.  On flat surfaces and on declines it fine.  She notes that every time she flies she gets sick.  This was about 2 weeks ago when she returned.  She has little bit of congestion.  Not bad but worse than normal.  We discussed treating for exacerbation.  We also discussed new nebulizer medicine to help optimize her COPD regimen to help with shortness of breath and minimizing exacerbations.   HPI initial visit: Establishing care with me today.  Diagnosed with COPD.  Maintained on Trelegy.  Unclear how much it helps.  She lives with dyspnea.  Some congestion at [facility].  Living outside.  Unclear how much the Trelegy is helping.  Notes with exacerbation in the past levofloxacin  helped quite a bit.  She is undergoing lung cancer screening.  Recent CT scan stable, ongoing care per Lauraine Lites, NP.  Discussed trying different halos.  New medicines help with COPD in the future.  Especially given some residual dyspnea.  She expressed understanding.  Questionaires / Pulmonary Flowsheets:   ACT:      No data to display          MMRC: mMRC Dyspnea Scale mMRC Score  03/21/2020 10:00 AM 2    Epworth:      No data to display          Tests:   FENO:  No results found for: NITRICOXIDE  PFT:    Latest Ref Rng & Units 08/25/2016   11:03 AM  PFT Results  FVC-Pre L 2.63   FVC-Predicted Pre % 77   FVC-Post L 2.72   FVC-Predicted Post % 80   Pre FEV1/FVC % % 55   Post FEV1/FCV % % 57   FEV1-Pre L 1.45    FEV1-Predicted Pre % 55   FEV1-Post L 1.55   DLCO uncorrected ml/min/mmHg 13.36   DLCO UNC% % 52   DLCO corrected ml/min/mmHg 13.01   DLCO COR %Predicted % 51   DLVA Predicted % 61   TLC L 5.33   TLC % Predicted % 103   RV % Predicted % 125   Personally viewed interpreted as moderate fixed obstruction, COPD, no significant bronchodilator response, lung volumes consistent with air trapping, DLCO moderately reduced  WALK:      No data to display          Imaging: Personally reviewed and as per EMR and discussion in this note No results found.  Lab Results: Personally reviewed CBC    Component Value Date/Time   WBC 7.3 09/24/2023 1124   RBC 5.50 (H) 09/24/2023 1124   HGB 17.5 (H) 09/24/2023 1124   HGB 14.6 06/01/2013 1107   HCT 53.7 (H) 09/24/2023 1124   HCT 44.9 06/01/2013 1107   PLT 198.0 09/24/2023 1124   PLT 199 06/01/2013 1107   MCV 97.6 09/24/2023 1124   MCV 93 06/01/2013 1107   MCH 32.9 11/04/2018 1319   MCHC 32.7 09/24/2023 1124  RDW 15.1 09/24/2023 1124   RDW 13.6 06/01/2013 1107   LYMPHSABS 2.4 09/24/2023 1124   LYMPHSABS 2.5 06/01/2013 1107   MONOABS 0.5 09/24/2023 1124   EOSABS 0.1 09/24/2023 1124   EOSABS 0.0 06/01/2013 1107   BASOSABS 0.1 09/24/2023 1124   BASOSABS 0.0 06/01/2013 1107    BMET    Component Value Date/Time   NA 139 09/24/2023 1124   K 4.0 09/24/2023 1124   CL 100 09/24/2023 1124   CO2 31 09/24/2023 1124   GLUCOSE 89 09/24/2023 1124   BUN 13 09/24/2023 1124   CREATININE 0.72 09/24/2023 1124   CALCIUM  9.2 09/24/2023 1124   GFRNONAA >60 11/04/2018 1319   GFRAA >60 11/04/2018 1319    BNP No results found for: BNP  ProBNP No results found for: PROBNP  Specialty Problems       Pulmonary Problems   COPD (chronic obstructive pulmonary disease) (HCC)   PFT 08/25/16 >> FEV1 1.55 (59%), FEV1% 57, TLC 5.33 (103%), DLCO 52%  CT chest 01/26/17 >> moderate centrilobular emphysema, mild b/l BTX with tree in bud, LUL nodule  resolved, stable 7 mm RLL nodule since 2015       COPD exacerbation (HCC)   [facility] pulm, pt has not followed up on below study Mild mediastinal lymphadenopathy has progressed minimally from CT from 2010. 2. Left periaortic lymph nodes are not pathologic by size criteria not significant changed from recent CT 03/18/2013. 3. Spleen is normal. 4. Benign hemangioma within the liver. 5. Progressive probable inflammatory or infectious process in the lower lobes and lingula. Patient may benefit from a high-resolution CTA or bronchoscopy to further evaluate. Consider pulmonology consultation.      Chronic cough   Pulmonary nodules/lesions, multiple   Nocturnal hypoxemia due to emphysema (HCC)   ONO with RA 07/29/18 >> test time 6 hrs 23 min.  Basal SpO2 86%, low SpO2 72%.  Spent 289.2 min with SpO2 < 88%.      Bronchiectasis with acute lower respiratory infection (HCC)    No Known Allergies  Immunization History  Administered Date(s) Administered   Fluad Trivalent(High Dose 65+) 02/11/2023   Influenza Inj Mdck Quad Pf 02/28/2020   Influenza Split 04/09/2011   Influenza Whole 02/02/2009   Influenza,inj,Quad PF,6+ Mos 01/26/2014, 02/06/2017, 03/04/2018, 01/26/2019, 01/14/2021   Influenza-Unspecified 02/02/2013, 02/24/2015, 04/03/2016   Moderna Sars-Covid-2 Vaccination 07/19/2019, 08/16/2019   PNEUMOCOCCAL CONJUGATE-20 07/15/2021   Pneumococcal Polysaccharide-23 01/26/2019   Tdap 03/04/2023   Zoster Recombinant(Shingrix) 03/04/2018, 01/14/2021    Past Medical History:  Diagnosis Date   Acute bursitis of right shoulder 09/16/2017   Injected in November 13, 2017   Acute lateral meniscal tear 09/09/2017   Arthritis    Avulsion fracture of lateral malleolus of right fibula 07/20/2018   Basal cell carcinoma    COPD (chronic obstructive pulmonary disease) (HCC)    DVT (deep venous thrombosis) (HCC)    Essential hypertension 07/27/2020   Hematuria 06/07/2015   07/2015: [facility]  cysto and urine studies  09/2015: , cystoscopy and urine cytology negative for concerns, benign bladder neck polyp, grade 1 trabeculation, benign microhematuria. RTC PRN.      History of acute bronchitis 03/24/2011   Kidney stones    Lymphadenopathy, abdominal 03/2013   Noted on abd/pelv CT done to eval slow-to-resolve pyelo   Meniere disorder 01/23/2012   Osteoporosis 12/2014   Dr. Rosalynn, her GYN, dx'd her   Pneumonia    Pyelonephritis 10/28/2017   Recurrent pyelonephritis    Seasonal allergic  rhinitis    Squamous cell carcinoma in situ (SCCIS) of skin of lower leg 05/04/2018   Dr. Toribio Meadows, [facility]   Squamous cell carcinoma in situ (SCCIS) of skin of lower leg 05/04/2018   Dr. Toribio Meadows, [facility]  Invasive Squamous cell carcinoma margins of excision appear free of tumor in the sections examined.  Left Lateral Leg: Superficially invasive squamous cell carcinoma, extending to the deep margin.     Tobacco History: Social History   Tobacco Use  Smoking Status Every Day   Current packs/day: 1.00   Average packs/day: 1 pack/day for 45.5 years (45.5 ttl pk-yrs)   Types: Cigarettes   Start date: 06/01/1978  Smokeless Tobacco Never  Tobacco Comments   still smoking a pack a day as of 09/18/2020   Ready to quit: Not Answered Counseling given: Not Answered Tobacco comments: still smoking a pack a day as of 09/18/2020   Continue to not smoke  Outpatient Encounter Medications as of 11/24/2023  Medication Sig   albuterol  (PROVENTIL ) (2.5 MG/3ML) 0.083% nebulizer solution Take 3 mLs (2.5 mg total) by nebulization every 6 (six) hours as needed for wheezing or shortness of breath.   albuterol  (VENTOLIN  HFA) 108 (90 Base) MCG/ACT inhaler Inhale 2 puffs into the lungs every 6 (six) hours as needed for wheezing or shortness of breath. INHALE 1-2 PUFFS INTO THE LUNGS EVERY 4 HOURS AS NEEDED FOR WHEEZING OR SHORTNESS OF BREATH.   alendronate  (FOSAMAX ) 70 MG  tablet TAKE ONE TABLET BY MOUTH ONCE A WEEK WITH A FULL GLASS OF WATER ON AN EMPTY STOMACH.   budeson-glycopyrrolate -formoterol  (BREZTRI  AEROSPHERE) 160-9-4.8 MCG/ACT AERO Inhale 2 puffs into the lungs in the morning and at bedtime.   fluorouracil  (EFUDEX ) 5 % cream Apply topically 3 (three) times daily as needed.   guaiFENesin  (MUCINEX ) 600 MG 12 hr tablet Take 1 tablet (600 mg total) by mouth 2 (two) times daily.   ipratropium (ATROVENT ) 0.06 % nasal spray Place 2 sprays into both nostrils 4 (four) times daily as needed for rhinitis.   levofloxacin  (LEVAQUIN ) 750 MG tablet Take 1 tablet (750 mg total) by mouth daily for 5 days.   montelukast  (SINGULAIR ) 10 MG tablet Take 1 tablet (10 mg total) by mouth at bedtime.   Multiple Vitamin (MULTIVITAMIN) tablet Take 1 tablet by mouth daily.   predniSONE  (DELTASONE ) 20 MG tablet Take 2 tablets (40 mg total) by mouth daily with breakfast for 5 days.   Respiratory Therapy Supplies (FLUTTER) DEVI Twice a day and prn as needed, may increase if feeling worse   sodium chloride  HYPERTONIC 3 % nebulizer solution TAKE BY NEBULIZATION 2 (TWO) TIMES DAILY AS NEEDED FOR OTHER.   Spacer/Aero-Holding Chambers (AEROCHAMBER MV) inhaler Use as instructed   valsartan -hydrochlorothiazide  (DIOVAN -HCT) 320-25 MG tablet Take 1 tablet by mouth daily.   VITAMIN D , CHOLECALCIFEROL, PO Take 5,000 Units by mouth daily.    tretinoin (RETIN-A) 0.1 % cream Apply topically at bedtime as needed. (Patient not taking: Reported on 11/24/2023)   No facility-administered encounter medications on file as of 11/24/2023.     Review of Systems  Review of Systems  N/a Physical Exam  BP (!) 150/98   Pulse (!) 107   Temp 97.9 F (36.6 C) (Temporal)   Ht 5' 4 (1.626 m)   Wt 104 lb 8 oz (47.4 kg)   SpO2 91% Comment: RA  BMI 17.94 kg/m   Wt Readings from Last 5 Encounters:  11/24/23 104 lb 8  oz (47.4 kg)  09/24/23 106 lb 12.8 oz (48.4 kg)  07/08/23 107 lb 12.8 oz (48.9 kg)   05/04/23 108 lb 12.8 oz (49.4 kg)  04/06/23 110 lb 6.4 oz (50.1 kg)    BMI Readings from Last 5 Encounters:  11/24/23 17.94 kg/m  09/24/23 18.33 kg/m  07/08/23 18.50 kg/m  05/04/23 18.39 kg/m  04/06/23 18.66 kg/m     Physical Exam General: Sitting in chair, no acute distress Eyes: EOMI, no icterus Neck: Supple, no JVP Pulmonary: Distant, low pitched end expiratory wheeze in upper lung fields bilaterally, normal work of breathing Cardiovascular: Warm no edema Abdomen: Nondistended, bowel sounds present MSK: No synovitis, no joint effusion Neuro: Normal gait, no weakness Psych: Normal mood, full affect   Assessment & Plan:   Moderate COPD with acute exacerbation: Based on pulmonary function test 2018.  Still with some significant dyspnea.  Improved with switch from Trelegy to Breztri .  Worsening symptoms from return flight.  Prednisone , Levaquin  prescribed today.  Paperwork for Ohtuvayre  to add to regimen.  Tobacco abuse: Encouraged ongoing lung cancer screening.  Repeat CT scan already ordered.   Return in about 6 months (around 05/26/2024) for f/u Dr. Annella.   Donnice JONELLE Annella, MD 11/24/2023

## 2023-11-24 NOTE — Telephone Encounter (Signed)
 Ohtuvayre  paperwork handed into pharmacy

## 2023-11-27 ENCOUNTER — Telehealth: Payer: Self-pay

## 2023-11-27 NOTE — Telephone Encounter (Signed)
 Received Ohtuvayre new start paperwork. Completed form and faxed with clinicals and insurance card copy to [facility]   Phone#: [phone] Fax#: [phone]

## 2023-11-30 DIAGNOSIS — H04123 Dry eye syndrome of bilateral lacrimal glands: Secondary | ICD-10-CM | POA: Diagnosis not present

## 2023-11-30 DIAGNOSIS — H2513 Age-related nuclear cataract, bilateral: Secondary | ICD-10-CM | POA: Diagnosis not present

## 2023-11-30 NOTE — Telephone Encounter (Signed)
 Received confirmation of Ohtuvayre  enrollment form receipt by [employer]

## 2023-12-01 NOTE — Telephone Encounter (Signed)
 SABRA

## 2023-12-11 ENCOUNTER — Ambulatory Visit (INDEPENDENT_AMBULATORY_CARE_PROVIDER_SITE_OTHER): Admitting: Family Medicine

## 2023-12-11 ENCOUNTER — Encounter: Payer: Self-pay | Admitting: Family Medicine

## 2023-12-11 VITALS — BP 122/85 | HR 102 | Temp 99.1°F | Ht 64.0 in | Wt 104.4 lb

## 2023-12-11 DIAGNOSIS — R051 Acute cough: Secondary | ICD-10-CM

## 2023-12-11 DIAGNOSIS — F17210 Nicotine dependence, cigarettes, uncomplicated: Secondary | ICD-10-CM | POA: Diagnosis not present

## 2023-12-11 DIAGNOSIS — U071 COVID-19: Secondary | ICD-10-CM

## 2023-12-11 DIAGNOSIS — J988 Other specified respiratory disorders: Secondary | ICD-10-CM

## 2023-12-11 DIAGNOSIS — J441 Chronic obstructive pulmonary disease with (acute) exacerbation: Secondary | ICD-10-CM

## 2023-12-11 LAB — POC COVID19 BINAXNOW: SARS Coronavirus 2 Ag: POSITIVE — AB

## 2023-12-11 MED ORDER — NIRMATRELVIR/RITONAVIR (PAXLOVID)TABLET: 3.0000 | ORAL_TABLET | Freq: Two times a day (BID) | ORAL | 0 refills | Status: AC

## 2023-12-11 MED ORDER — LEVOFLOXACIN 500 MG PO TABS: 500.0000 mg | ORAL_TABLET | Freq: Every day | ORAL | 0 refills | Status: AC

## 2023-12-11 MED ORDER — PREDNISONE 10 MG PO TABS
ORAL_TABLET | ORAL | 0 refills | Status: DC
Start: 2023-12-11 — End: 2024-02-03

## 2023-12-11 NOTE — Progress Notes (Signed)
 OFFICE VISIT  12/11/2023  CC:  Chief Complaint  Patient presents with   Head Congestion    Pt also c/o chest congestion, cough, fever, fatigue. 2 weeks ago, took Levaquin  and Prednisone .    Patient is a 68 y.o. female who presents for respiratory concerns.  HPI: 2 days ago she developed a headache and bodyaches and fever.  Also lots of fatigue and sinus pressure and congestion.  Just a little bit of cough, not significantly above her baseline chronic bronchitis cough. She denies shortness of breath, wheezing, or chest pain. She does currently smoke cigarettes.  She has COPD, maintenance med is Breztri .  Review of systems: No rash, no abdominal pain, no nausea, no vomiting, no diarrhea. No lower extremity swelling.  Past Medical History:  Diagnosis Date   Acute bursitis of right shoulder 09/16/2017   Injected in November 13, 2017   Acute lateral meniscal tear 09/09/2017   Arthritis    Avulsion fracture of lateral malleolus of right fibula 07/20/2018   Basal cell carcinoma    COPD (chronic obstructive pulmonary disease) (HCC)    DVT (deep venous thrombosis) (HCC)    Essential hypertension 07/27/2020   Hematuria 06/07/2015   07/2015: [facility] cysto and urine studies  09/2015: , cystoscopy and urine cytology negative for concerns, benign bladder neck polyp, grade 1 trabeculation, benign microhematuria. RTC PRN.      History of acute bronchitis 03/24/2011   Kidney stones    Lymphadenopathy, abdominal 03/2013   Noted on abd/pelv CT done to eval slow-to-resolve pyelo   Meniere disorder 01/23/2012   Osteoporosis 12/2014   Dr. Rosalynn, her GYN, dx'd her   Pneumonia    Pyelonephritis 10/28/2017   Recurrent pyelonephritis    Seasonal allergic rhinitis    Squamous cell carcinoma in situ (SCCIS) of skin of lower leg 05/04/2018   Dr. Toribio Meadows, [facility]   Squamous cell carcinoma in situ (SCCIS) of skin of lower leg 05/04/2018   Dr. Toribio Meadows, [facility]  Invasive Squamous cell carcinoma margins of excision appear free of tumor in the sections examined.  Left Lateral Leg: Superficially invasive squamous cell carcinoma, extending to the deep margin.     Past Surgical History:  Procedure Laterality Date   DEXA  12/2014   T score spine -2.4, hip -3.5 (Dr. Rosalynn, [facility].   DILATION AND CURETTAGE OF UTERUS  2009   SKIN LESION EXCISION Left 05/24/2018   SCC; Skin, Excision, Left La[place] leg: postsurgical scar, no neoplasm is identified.  Skin, Shave bx and ED&C, right anterior thigh-Superficially invasive squamous cell carcinoma, extending to the deep margin.    Outpatient Medications Prior to Visit  Medication Sig Dispense Refill   albuterol  (PROVENTIL ) (2.5 MG/3ML) 0.083% nebulizer solution Take 3 mLs (2.5 mg total) by nebulization every 6 (six) hours as needed for wheezing or shortness of breath. 360 mL 5   albuterol  (VENTOLIN  HFA) 108 (90 Base) MCG/ACT inhaler Inhale 2 puffs into the lungs every 6 (six) hours as needed for wheezing or shortness of breath. INHALE 1-2 PUFFS INTO THE LUNGS EVERY 4 HOURS AS NEEDED FOR WHEEZING OR SHORTNESS OF BREATH. 8 g 3   alendronate  (FOSAMAX ) 70 MG tablet TAKE ONE TABLET BY MOUTH ONCE A WEEK WITH A FULL GLASS OF WATER ON AN EMPTY STOMACH. 12 tablet 3   budeson-glycopyrrolate -formoterol  (BREZTRI  AEROSPHERE) 160-9-4.8 MCG/ACT AERO Inhale 2 puffs into the lungs in the morning and at bedtime. 10.7 g 11  fluorouracil  (EFUDEX ) 5 % cream Apply topically 3 (three) times daily as needed.     guaiFENesin  (MUCINEX ) 600 MG 12 hr tablet Take 1 tablet (600 mg total) by mouth 2 (two) times daily.     montelukast  (SINGULAIR ) 10 MG tablet Take 1 tablet (10 mg total) by mouth at bedtime. 90 tablet 3   Multiple Vitamin (MULTIVITAMIN) tablet Take 1 tablet by mouth daily.     Oxymetazoline HCl (AFRIN NASAL SPRAY NA) Place into the nose as needed.     Respiratory Therapy Supplies (FLUTTER) DEVI Twice a  day and prn as needed, may increase if feeling worse 1 each 0   sodium chloride  HYPERTONIC 3 % nebulizer solution TAKE BY NEBULIZATION 2 (TWO) TIMES DAILY AS NEEDED FOR OTHER. 750 mL 12   Spacer/Aero-Holding Chambers (AEROCHAMBER MV) inhaler Use as instructed 1 each 2   valsartan -hydrochlorothiazide  (DIOVAN -HCT) 320-25 MG tablet Take 1 tablet by mouth daily. 90 tablet 3   VITAMIN D , CHOLECALCIFEROL, PO Take 5,000 Units by mouth daily.      ipratropium (ATROVENT ) 0.06 % nasal spray Place 2 sprays into both nostrils 4 (four) times daily as needed for rhinitis. (Patient not taking: Reported on 12/11/2023) 15 mL 5   tretinoin (RETIN-A) 0.1 % cream Apply topically at bedtime as needed. (Patient not taking: Reported on 12/11/2023)     No facility-administered medications prior to visit.    No Known Allergies  Review of Systems  As per HPI  PE:    12/11/2023    2:42 PM 11/24/2023    9:49 AM 09/24/2023   11:03 AM  Vitals with BMI  Height 5' 4 5' 4   Weight 104 lbs 6 oz 104 lbs 8 oz 106 lbs 13 oz  BMI 17.91 17.93   Systolic 122 150 826  Diastolic 85 98 75  Pulse 102 107 94     Physical Exam  VS: noted--normal. Gen: alert, NAD, NONTOXIC APPEARING. HEENT: eyes without injection, drainage, or swelling.  Ears: EACs clear, TMs with normal light reflex and landmarks.  Nose: Clear rhinorrhea, with some dried, crusty exudate adherent to mildly injected mucosa.  No purulent d/c.  Bilateral paranasal sinus tenderness to palpation..  No facial swelling.  Throat and mouth without focal lesion.  No pharyngial swelling or exudate.  She has diffuse pharyngeal erythema and some clear postnasal drip. Neck: supple, no LAD.   LUNGS: CTA bilat, nonlabored resps.  Good aeration.  Expiratory phase not significantly prolonged. CV: Regular rhythm, mild tachycardia, no murmur. EXT: no c/c/e SKIN: no rash  LABS:  Last CBC Lab Results  Component Value Date   WBC 7.3 09/24/2023   HGB 17.5 (H) 09/24/2023   HCT  53.7 (H) 09/24/2023   MCV 97.6 09/24/2023   MCH 32.9 11/04/2018   RDW 15.1 09/24/2023   PLT 198.0 09/24/2023   Last metabolic panel Lab Results  Component Value Date   GLUCOSE 89 09/24/2023   NA 139 09/24/2023   K 4.0 09/24/2023   CL 100 09/24/2023   CO2 31 09/24/2023   BUN 13 09/24/2023   CREATININE 0.72 09/24/2023   GFR 67.48 11/04/2022   CALCIUM  9.2 09/24/2023   PHOS 3.2 06/25/2011   PROT 6.8 09/24/2023   ALBUMIN 3.7 11/04/2022   BILITOT 0.7 09/24/2023   ALKPHOS 61 11/04/2022   AST 32 09/24/2023   ALT 16 09/24/2023   ANIONGAP 11 11/04/2018   IMPRESSION AND PLAN:  Acute URI with fever and bodyaches. Covid POSITIVE here today. High risk  for complications (active smoker, COPD, age greater than 55). GFR 75. Paxlovid  x 5 days prescribed today. High risk of COPD exacerbation.  Will treat with Levaquin  500 mg day x 7 days and a prednisone  taper--> 40 mg daily x 3 days, 30 mg daily x 3 days, 20 mg daily x 3 days, 10 mg daily x 3 days.   Quarantine precautions discussed. Signs/symptoms to call or return for were reviewed and pt expressed understanding.  An After Visit Summary was printed and given to the patient.  FOLLOW UP: No follow-ups on file.  Signed:  Gerlene Hockey, MD           12/11/2023

## 2024-02-03 ENCOUNTER — Ambulatory Visit: Payer: Self-pay | Admitting: Primary Care

## 2024-02-03 ENCOUNTER — Encounter: Payer: Self-pay | Admitting: Primary Care

## 2024-02-03 ENCOUNTER — Ambulatory Visit (INDEPENDENT_AMBULATORY_CARE_PROVIDER_SITE_OTHER)

## 2024-02-03 ENCOUNTER — Ambulatory Visit: Payer: Self-pay

## 2024-02-03 ENCOUNTER — Ambulatory Visit (INDEPENDENT_AMBULATORY_CARE_PROVIDER_SITE_OTHER): Admitting: Primary Care

## 2024-02-03 VITALS — BP 126/72 | HR 88 | Temp 97.9°F | Ht 63.5 in | Wt 102.8 lb

## 2024-02-03 DIAGNOSIS — J449 Chronic obstructive pulmonary disease, unspecified: Secondary | ICD-10-CM | POA: Diagnosis not present

## 2024-02-03 DIAGNOSIS — R0989 Other specified symptoms and signs involving the circulatory and respiratory systems: Secondary | ICD-10-CM

## 2024-02-03 DIAGNOSIS — R059 Cough, unspecified: Secondary | ICD-10-CM

## 2024-02-03 DIAGNOSIS — J471 Bronchiectasis with (acute) exacerbation: Secondary | ICD-10-CM

## 2024-02-03 DIAGNOSIS — J479 Bronchiectasis, uncomplicated: Secondary | ICD-10-CM | POA: Diagnosis not present

## 2024-02-03 DIAGNOSIS — R509 Fever, unspecified: Secondary | ICD-10-CM | POA: Diagnosis not present

## 2024-02-03 LAB — POCT INFLUENZA A/B
Influenza A, POC: NEGATIVE
Influenza B, POC: NEGATIVE

## 2024-02-03 LAB — POC COVID19 BINAXNOW: SARS Coronavirus 2 Ag: NEGATIVE

## 2024-02-03 MED ORDER — SODIUM CHLORIDE 3 % IN NEBU: INHALATION_SOLUTION | Freq: Two times a day (BID) | RESPIRATORY_TRACT | 3 refills | Status: DC | PRN

## 2024-02-03 MED ORDER — PREDNISONE 10 MG PO TABS: ORAL_TABLET | ORAL | 0 refills | Status: DC

## 2024-02-03 MED ORDER — LEVOFLOXACIN 500 MG PO TABS: 500.0000 mg | ORAL_TABLET | Freq: Every day | ORAL | 0 refills | Status: DC

## 2024-02-03 MED ORDER — ALBUTEROL SULFATE HFA 108 (90 BASE) MCG/ACT IN AERS: 2.0000 | INHALATION_SPRAY | Freq: Four times a day (QID) | RESPIRATORY_TRACT | 3 refills | Status: AC | PRN

## 2024-02-03 NOTE — Progress Notes (Signed)
 Cxr   @Patient  ID: [name], female    DOB: Apr 30, 1956, 68 y.o.   MRN: [id number]  No chief complaint on file.   Referring provider: Catherine Charlies LABOR, DO  HPI: 68 year old female, current every day smoker. PMH significant for HTN, COPD, bronchiectasis, pulmonary nodules, nocturnal hypoxemia, osteoporosis, chronic cough. Patient of Dr. Annella, last seen in March 2025.   02/03/2024 Discussed the use of AI scribe software for clinical note transcription with the patient, who gave verbal consent to proceed.  History of Present Illness [name] is a 68 year old female with bronchiectasis who presents with congestion and fever.  She has been experiencing congestion and fever, with the fever reaching 100.56F this morning. Advil taken at 7:30 AM helped reduce the fever. Symptoms began last Wednesday or Thursday, with a worsening cough starting Friday or Saturday.  She takes two Mucinex  daily and uses a Breztri  inhaler twice daily. She also uses a nebulizer every four hours, which can cause jitters, but does not use oxygen at night. She coughs up mucus that is sometimes yellow, creamy, or green. She experiences wheezing and difficulty taking deep breaths.  She has not received a flu shot and has been around sick family members, including a great-grandson with whooping cough. She tested positive for COVID-19 in July after a previous visit, during which she was congested and treated with Levaquin , to which she responds well.  She has not had any recent chest imaging. She is unsure if she has a hypertonic saline nebulizer. She has a large family, including many grandchildren, which may contribute to her exposure to illnesses.    No Known Allergies  Immunization History  Administered Date(s) Administered   Fluad Trivalent(High Dose 65+) 02/11/2023   Influenza Inj Mdck Quad Pf 02/28/2020   Influenza Split 04/09/2011   Influenza Whole 02/02/2009   Influenza,inj,Quad PF,6+ Mos  01/26/2014, 02/06/2017, 03/04/2018, 01/26/2019, 01/14/2021   Influenza-Unspecified 02/02/2013, 02/24/2015, 04/03/2016   Moderna Sars-Covid-2 Vaccination 07/19/2019, 08/16/2019   PNEUMOCOCCAL CONJUGATE-20 07/15/2021   Pneumococcal Polysaccharide-23 01/26/2019   Tdap 03/04/2023   Zoster Recombinant(Shingrix) 03/04/2018, 01/14/2021    Past Medical History:  Diagnosis Date   Acute bursitis of right shoulder 09/16/2017   Injected in November 13, 2017   Acute lateral meniscal tear 09/09/2017   Arthritis    Avulsion fracture of lateral malleolus of right fibula 07/20/2018   Basal cell carcinoma    COPD (chronic obstructive pulmonary disease) (HCC)    DVT (deep venous thrombosis) (HCC)    Essential hypertension 07/27/2020   Hematuria 06/07/2015   07/2015: [facility] cysto and urine studies  09/2015: , cystoscopy and urine cytology negative for concerns, benign bladder neck polyp, grade 1 trabeculation, benign microhematuria. RTC PRN.      History of acute bronchitis 03/24/2011   Kidney stones    Lymphadenopathy, abdominal 03/2013   Noted on abd/pelv CT done to eval slow-to-resolve pyelo   Meniere disorder 01/23/2012   Osteoporosis 12/2014   Dr. Rosalynn, her GYN, dx'd her   Pneumonia    Pyelonephritis 10/28/2017   Recurrent pyelonephritis    Seasonal allergic rhinitis    Squamous cell carcinoma in situ (SCCIS) of skin of lower leg 05/04/2018   Dr. Toribio Meadows, [facility]   Squamous cell carcinoma in situ (SCCIS) of skin of lower leg 05/04/2018   Dr. Toribio Meadows, [facility]  Invasive Squamous cell carcinoma margins of excision appear free of tumor in the sections examined.  Left Lateral Leg: Superficially invasive squamous cell carcinoma, extending to the deep margin.     Tobacco History: Social History   Tobacco Use  Smoking Status Every Day   Current packs/day: 1.00   Average packs/day: 1 pack/day for 45.7 years (45.7 ttl pk-yrs)   Types: Cigarettes    Start date: 06/01/1978  Smokeless Tobacco Never  Tobacco Comments   still smoking a pack a day as of 09/18/2020   Ready to quit: Not Answered Counseling given: Not Answered Tobacco comments: still smoking a pack a day as of 09/18/2020   Outpatient Medications Prior to Visit  Medication Sig Dispense Refill   albuterol  (PROVENTIL ) (2.5 MG/3ML) 0.083% nebulizer solution Take 3 mLs (2.5 mg total) by nebulization every 6 (six) hours as needed for wheezing or shortness of breath. 360 mL 5   albuterol  (VENTOLIN  HFA) 108 (90 Base) MCG/ACT inhaler Inhale 2 puffs into the lungs every 6 (six) hours as needed for wheezing or shortness of breath. INHALE 1-2 PUFFS INTO THE LUNGS EVERY 4 HOURS AS NEEDED FOR WHEEZING OR SHORTNESS OF BREATH. 8 g 3   alendronate  (FOSAMAX ) 70 MG tablet TAKE ONE TABLET BY MOUTH ONCE A WEEK WITH A FULL GLASS OF WATER ON AN EMPTY STOMACH. 12 tablet 3   budeson-glycopyrrolate -formoterol  (BREZTRI  AEROSPHERE) 160-9-4.8 MCG/ACT AERO Inhale 2 puffs into the lungs in the morning and at bedtime. 10.7 g 11   fluorouracil  (EFUDEX ) 5 % cream Apply topically 3 (three) times daily as needed.     guaiFENesin  (MUCINEX ) 600 MG 12 hr tablet Take 1 tablet (600 mg total) by mouth 2 (two) times daily.     ipratropium (ATROVENT ) 0.06 % nasal spray Place 2 sprays into both nostrils 4 (four) times daily as needed for rhinitis. (Patient not taking: Reported on 12/11/2023) 15 mL 5   montelukast  (SINGULAIR ) 10 MG tablet Take 1 tablet (10 mg total) by mouth at bedtime. 90 tablet 3   Multiple Vitamin (MULTIVITAMIN) tablet Take 1 tablet by mouth daily.     Oxymetazoline HCl (AFRIN NASAL SPRAY NA) Place into the nose as needed.     predniSONE  (DELTASONE ) 10 MG tablet 4 every day x 3d then 3 every day x 3d then 2 every day x 3d then 1 every day x 3d 30 tablet 0   Respiratory Therapy Supplies (FLUTTER) DEVI Twice a day and prn as needed, may increase if feeling worse 1 each 0   sodium chloride  HYPERTONIC 3 %  nebulizer solution TAKE BY NEBULIZATION 2 (TWO) TIMES DAILY AS NEEDED FOR OTHER. 750 mL 12   Spacer/Aero-Holding Chambers (AEROCHAMBER MV) inhaler Use as instructed 1 each 2   tretinoin (RETIN-A) 0.1 % cream Apply topically at bedtime as needed. (Patient not taking: Reported on 12/11/2023)     valsartan -hydrochlorothiazide  (DIOVAN -HCT) 320-25 MG tablet Take 1 tablet by mouth daily. 90 tablet 3   VITAMIN D , CHOLECALCIFEROL, PO Take 5,000 Units by mouth daily.      No facility-administered medications prior to visit.   Review of Systems  Review of Systems  Constitutional:  Positive for fever.  HENT:  Positive for congestion.   Respiratory:  Positive for cough.    Physical Exam  There were no vitals taken for this visit. Physical Exam Constitutional:      General: She is not in acute distress.    Appearance: Normal appearance. She is ill-appearing. She is not toxic-appearing or diaphoretic.  HENT:     Head: Normocephalic and atraumatic.  Cardiovascular:  Rate and Rhythm: Normal rate and regular rhythm.  Pulmonary:     Effort: Pulmonary effort is normal.     Breath sounds: Wheezing and rhonchi present.  Neurological:     General: No focal deficit present.     Mental Status: She is alert and oriented to person, place, and time. Mental status is at baseline.  Psychiatric:        Mood and Affect: Mood normal.        Behavior: Behavior normal.        Thought Content: Thought content normal.        Judgment: Judgment normal.     Lab Results:  CBC    Component Value Date/Time   WBC 7.3 09/24/2023 1124   RBC 5.50 (H) 09/24/2023 1124   HGB 17.5 (H) 09/24/2023 1124   HGB 14.6 06/01/2013 1107   HCT 53.7 (H) 09/24/2023 1124   HCT 44.9 06/01/2013 1107   PLT 198.0 09/24/2023 1124   PLT 199 06/01/2013 1107   MCV 97.6 09/24/2023 1124   MCV 93 06/01/2013 1107   MCH 32.9 11/04/2018 1319   MCHC 32.7 09/24/2023 1124   RDW 15.1 09/24/2023 1124   RDW 13.6 06/01/2013 1107   LYMPHSABS  2.4 09/24/2023 1124   LYMPHSABS 2.5 06/01/2013 1107   MONOABS 0.5 09/24/2023 1124   EOSABS 0.1 09/24/2023 1124   EOSABS 0.0 06/01/2013 1107   BASOSABS 0.1 09/24/2023 1124   BASOSABS 0.0 06/01/2013 1107    BMET    Component Value Date/Time   NA 139 09/24/2023 1124   K 4.0 09/24/2023 1124   CL 100 09/24/2023 1124   CO2 31 09/24/2023 1124   GLUCOSE 89 09/24/2023 1124   BUN 13 09/24/2023 1124   CREATININE 0.72 09/24/2023 1124   CALCIUM  9.2 09/24/2023 1124   GFRNONAA >60 11/04/2018 1319   GFRAA >60 11/04/2018 1319    BNP No results found for: BNP  ProBNP No results found for: PROBNP  Imaging: No results found.   Assessment & Plan:   1. Bronchiectasis with acute exacerbation (HCC) (Primary) - DG Chest 2 View; Future  Assessment and Plan Assessment & Plan Acute lower respiratory tract infection with bronchiectasis Presents with congestion, fever, and cough for approximately one week. Reports fever of 100.93F this morning, responsive to Advil. Experiencing wheezing and difficulty with deep breaths. Sputum varies in color: yellow, creamy, or green. Bronchiectasis previously responsive to Levaquin . Possible acute lower respiratory tract infection, potentially exacerbated by bronchiectasis. Differential includes viral infections such as COVID-19 or influenza, given exposure to sick contacts, including a great-grandson with whooping cough and household history of COVID-19. No recent chest x-ray. - Order chest x-ray - Swab for COVID-19 and influenza, results were negative  - Prescribe prednisone  taper 40mg  x 3 days, 30mg  x 3 days, 20mg  x 3 days, 10mg  x 3 days  - Prescribe Levaquin  500mg  daily x 7 days  - Refill hypertonic saline nebulizer prescription - Instruct to use hypertonic saline nebulizer twice a day during congestion - Instruct to use albuterol  nebulizer every four hours as needed for shortness of breath - May benefit from addition of Smart vest or Brinsupri if  continues to have recurrent exacerbations   Chronic obstructive pulmonary disease  Moderate COPD based on PFT in 2018 - Continue Breztri  Aerosphere two puffs twice daily    Almarie LELON Ferrari, NP 02/03/2024

## 2024-02-03 NOTE — Addendum Note (Signed)
 Addended by: Emil Weigold T on: 02/03/2024 12:41 PM   Modules accepted: Orders

## 2024-02-03 NOTE — Patient Instructions (Addendum)
  VISIT SUMMARY: [name], a 68 year old female with bronchiectasis, came in today due to congestion and fever. She has been experiencing these symptoms for about a week, with a fever reaching 100.73F this morning, which was reduced by Advil. She has a worsening cough, wheezing, and difficulty taking deep breaths. She has not had a recent chest x-ray and has been exposed to sick family members, including a great-grandson with whooping cough.  YOUR PLAN: -ACUTE LOWER RESPIRATORY TRACT INFECTION WITH BRONCHIECTASIS: You have an acute lower respiratory tract infection, which means there is an infection in the lower part of your lungs. This is likely worsened by your existing bronchiectasis, a condition where the airways in your lungs are damaged. We will order a chest x-ray and swab for COVID-19 and influenza to determine the exact cause. You will be prescribed prednisone  to reduce inflammation and an antibiotic after we review your chest x-ray results. Additionally, your hypertonic saline nebulizer prescription will be refilled, and you should use it twice a day during congestion. Use your albuterol  nebulizer every four hours as needed for shortness of breath.  INSTRUCTIONS: Please get a chest x-ray and swabs for COVID-19 and influenza as soon as possible. Follow the medication instructions provided: take prednisone  as prescribed, and use the hypertonic saline nebulizer twice a day during congestion. Use the albuterol  nebulizer every four hours as needed for shortness of breath. We will prescribe an antibiotic after reviewing your chest x-ray results.  Testing: Covid and flu swab were negative Awaiting CXR results, will be back later today  RX: Prednisone  taper Levaquin   Hypertonic saline nebulizer   Follow-up Please schedule a follow-up in January with Dr. Annella

## 2024-02-03 NOTE — Telephone Encounter (Signed)
 FYI Only or Action Required?: Action required by provider: request for appointment.  Patient is followed in Pulmonology for COPD, Pulmonary Nodules, Bronchiectasis, last seen on 11/24/2023 by Hunsucker, Donnice SAUNDERS, MD.  Called Nurse Triage reporting No chief complaint on file..  Symptoms began a week ago.  Interventions attempted: Nebulizer treatments and Increased fluids/rest.  Symptoms are: gradually worsening.  Triage Disposition: See HCP Within 4 Hours (Or PCP Triage)  Patient/caregiver understands and will follow disposition?: Yes  Copied from CRM [id number]. Topic: Clinical - Red Word Triage >> Feb 03, 2024  8:06 AM Corean SAUNDERS wrote: Red Word that prompted transfer to Nurse Triage: Flare up consisting of a fever, coughing up green and yellow sputum, and shortness of breath. Reason for Disposition  [1] Fever > 100 F (37.8 C) AND [2] bedridden (e.g., CVA, chronic illness, recovering from surgery)  Answer Assessment - Initial Assessment Questions 1. ONSET: When did the cough begin?      Last Thursday  2. SEVERITY: How bad is the cough today?      Constant  3. SPUTUM: Describe the color of your sputum (e.g., none, dry cough; clear, white, yellow, green)     Yellow, Green  4. HEMOPTYSIS: Are you coughing up any blood? If Yes, ask: How much? (e.g., flecks, streaks, tablespoons, etc.)     No  5. DIFFICULTY BREATHING: Are you having difficulty breathing? If Yes, ask: How bad is it? (e.g., mild, moderate, severe)       6. FEVER: Do you have a fever? If Yes, ask: What is your temperature, how was it measured, and when did it start?     Yes, 101 7. CARDIAC HISTORY: Do you have any history of heart disease? (e.g., heart attack, congestive heart failure)      Hypertension  8. LUNG HISTORY: Do you have any history of lung disease?  (e.g., pulmonary embolus, asthma, emphysema)     COPD, Bronchiectasis,   9. PE RISK FACTORS: Do you have a history of blood clots?  (or: recent major surgery, recent prolonged travel, bedridden)     No  10. OTHER SYMPTOMS: Do you have any other symptoms? (e.g., runny nose, wheezing, chest pain)       Congestion  11. PREGNANCY: Is there any chance you are pregnant? When was your last menstrual period?       No and No  12. TRAVEL: Have you traveled out of the country in the last month? (e.g., travel history, exposures)       Yes, Grandchild, Whooping Cough, Daughter had congestion, all recent exposure  Protocols used: Cough - Acute Productive-A-AH

## 2024-02-03 NOTE — Progress Notes (Signed)
 Please let patient know CXR was negative for pneumonia. It showed stable COPD findings, hyperinflation and scarring. I have sent in prednisone  and levaquin  already

## 2024-02-04 NOTE — Progress Notes (Signed)
 Pt.notified

## 2024-02-08 NOTE — Telephone Encounter (Signed)
 I believe this was addressed at OV, acute visit on 10/1. Thanks!

## 2024-02-08 NOTE — Telephone Encounter (Signed)
 Dr. Judeth Horn, please advise.

## 2024-02-09 NOTE — Telephone Encounter (Signed)
 Saw Beth on 10/1.  Nothing further needed.

## 2024-02-29 ENCOUNTER — Encounter: Payer: Self-pay | Admitting: Acute Care

## 2024-03-01 DIAGNOSIS — H2513 Age-related nuclear cataract, bilateral: Secondary | ICD-10-CM | POA: Diagnosis not present

## 2024-03-01 DIAGNOSIS — H04123 Dry eye syndrome of bilateral lacrimal glands: Secondary | ICD-10-CM | POA: Diagnosis not present

## 2024-03-18 ENCOUNTER — Ambulatory Visit: Admitting: Acute Care

## 2024-03-18 ENCOUNTER — Ambulatory Visit: Payer: Self-pay | Admitting: Pulmonary Disease

## 2024-03-18 ENCOUNTER — Encounter: Payer: Self-pay | Admitting: Acute Care

## 2024-03-18 VITALS — Ht 64.0 in | Wt 102.0 lb

## 2024-03-18 DIAGNOSIS — J441 Chronic obstructive pulmonary disease with (acute) exacerbation: Secondary | ICD-10-CM

## 2024-03-18 MED ORDER — PREDNISONE 10 MG PO TABS: ORAL_TABLET | ORAL | 0 refills | Status: DC

## 2024-03-18 MED ORDER — DOXYCYCLINE HYCLATE 100 MG PO TABS: 100.0000 mg | ORAL_TABLET | Freq: Two times a day (BID) | ORAL | 0 refills | Status: DC

## 2024-03-18 NOTE — Patient Instructions (Addendum)
 It is good to see you today.  I am sorry you are not feeling well. We will treat your COPD flare with Doxycycline  and a predsnisone taper.  Take Doxycycline  100 mg , one tablet in the morning and one in the evening x 7 days.  I have also sent in a prednisone  taper , Prednisone  taper; 10 mg tablets: 4 tabs x 2 days, 3 tabs x 2 days, 2 tabs x 2 days 1 tab x 2 days then stop.   Follow up with Dr. Annella for follow up within 1-2 weeks to ensure you are better, and discuss adding Ohtuvayre . Continue Trelegy and albuterol  as you have been doing Call if you do not improve, so we can see you in the office.  Please contact office for sooner follow up if symptoms do not improve or worsen or seek emergency care

## 2024-03-18 NOTE — Progress Notes (Signed)
 Virtual Visit via Video Note  I connected with [name] on 03/18/24 at 11:30 AM EST by a video enabled telemedicine application and verified that I am speaking with the correct person using two identifiers.  Location: Patient:  At home Provider: [address] [place], [place], [address]    I discussed the limitations of evaluation and management by telemedicine and the availability of in person appointments. The patient expressed understanding and agreed to proceed.  History of Present Illness: 68 y.o. woman current every day smoker  with history of COPD with frequent flares whom we are seeing for acute visit.   She is followed by Dr. Annella.    Pt. Presents for an Acute Video Visit. She states she been congested. She developed a fever with chills to 100.2 on  11/13. She is coughing up green secretions. She has nasal congestion. Denies any body aches.We are unable to do any viral testing, as this is a video visit.We are also unable to do FENO.She does endorse wheezing.   She had similar symptoms 02/03/2024. CXR was negative for pneumonia at that time. She was treated with Pred taper and Levaquin  . She did get better after that.   She had Covid 12/11/2023. She was given a prescription for Paxlovid , but did not take it. She was treated with antibiotics at that time also. Levaquin  and Prednisone . She has had Levaquin  11/2023, 12/2023 and 02/2024. I will treat with Doxycycline  and a pred taper today.   She has been around her grandchildren who have been sick.  She did not want to start Ohtuvayre . She is compliant with her Breztri .   I explained she needs an in person OV for follow up in 1-2 weeks with Dr. Annella, and they need to discuss starting Ohtuvayre  to see if we can decrease flares.    Observations/Objective: Green secretions , able to speak in full sentences. NO breathlessness or distress.   Assessment and Plan: COPD Flare Wheezing, green secretions  Plan I am sorry  you are not feeling well. We will treat your COPD flare with Doxycycline  and a predsnisone taper.  Take Doxycycline  100 mg , one tablet in the morning and one in the evening x 7 days.  I have also sent in a prednisone  taper , Prednisone  taper; 10 mg tablets: 4 tabs x 2 days, 3 tabs x 2 days, 2 tabs x 2 days 1 tab x 2 days then stop.   Follow up with Dr. Annella for follow up within 1-2 weeks to ensure you are better, and discuss adding Ohtuvayre . Continue Trelegy and albuterol  as you have been doing Call if you do not improve, so we can see you in the office.  Please contact office for sooner follow up if symptoms do not improve or worsen or seek emergency care  Follow Up Instructions: Follow up with Dr. Annella for follow up within 1-2 weeks to ensure you are better, and discuss adding Ohtuvayre . Call if you do not improve, so we can see you in the office.  Please contact office for sooner follow up if symptoms do not improve or worsen or seek emergency care   I discussed the assessment and treatment plan with the patient. The patient was provided an opportunity to ask questions and all were answered. The patient agreed with the plan and demonstrated an understanding of the instructions.   The patient was advised to call back or seek an in-person evaluation if the symptoms worsen or if the  condition fails to improve as anticipated.  I spent 20 minutes dedicated to the care of this patient on the date of this encounter to include pre-visit review of records, video face-to-face time with the patient discussing conditions above, post visit ordering of testing, clinical documentation with the electronic health record, making appropriate referrals as documented, and communicating necessary information to the patient's healthcare team.   Lauraine JULIANNA Lites, NP  03/18/2024

## 2024-03-18 NOTE — Telephone Encounter (Signed)
 Pt is scheduled to see Lauraine MATSU for a mychart visit. NFN

## 2024-03-18 NOTE — Telephone Encounter (Signed)
 FYI Only or Action Required?: Action required by provider: request for appointment.  Patient is followed in Pulmonology for COPD, last seen on 02/03/2024 by Hope Almarie ORN, NP.  Called Nurse Triage reporting No chief complaint on file..  Symptoms began yesterday.  Interventions attempted: Rescue inhaler, Maintenance inhaler, and Nebulizer treatments.  Symptoms are: gradually worsening.  Triage Disposition: See Physician Within 24 Hours  Patient/caregiver understands and will follow disposition?: Yes Reason for Disposition  [1] Continuous (nonstop) coughing interferes with work or school AND [2] no improvement using cough treatment per Care Advice  Answer Assessment - Initial Assessment Questions Patient preference is going to be the mobile number.    1. ONSET: When did the cough begin?      Several Days Ago  2. SEVERITY: How bad is the cough today?      Intermittent  3. SPUTUM: Describe the color of your sputum (e.g., none, dry cough; clear, white, yellow, green)     Yes, Yellow to green  4. HEMOPTYSIS: Are you coughing up any blood? If Yes, ask: How much? (e.g., flecks, streaks, tablespoons, etc.)     No  5. DIFFICULTY BREATHING: Are you having difficulty breathing? If Yes, ask: How bad is it? (e.g., mild, moderate, severe)       No  6. FEVER: Do you have a fever? If Yes, ask: What is your temperature, how was it measured, and when did it start?     100  7. CARDIAC HISTORY: Do you have any history of heart disease? (e.g., heart attack, congestive heart failure)      No  8. LUNG HISTORY: Do you have any history of lung disease?  (e.g., pulmonary embolus, asthma, emphysema)     COPD  9. PE RISK FACTORS: Do you have a history of blood clots? (or: recent major surgery, recent prolonged travel, bedridden)     No  10. OTHER SYMPTOMS: Do you have any other symptoms? (e.g., runny nose, wheezing, chest pain)       Wheezing, Chest Congestion,  Runny Nose  11. PREGNANCY: Is there any chance you are pregnant? When was your last menstrual period?       No and No  12. TRAVEL: Have you traveled out of the country in the last month? (e.g., travel history, exposures)       No  Protocols used: Cough - Acute Productive-A-AH

## 2024-04-29 ENCOUNTER — Other Ambulatory Visit: Payer: Self-pay

## 2024-05-16 ENCOUNTER — Telehealth: Payer: Self-pay

## 2024-05-16 ENCOUNTER — Other Ambulatory Visit: Payer: Self-pay | Admitting: Family Medicine

## 2024-05-16 NOTE — Telephone Encounter (Signed)
 Copied from CRM #[id number]. Topic: General - Call Back - No Documentation >> May 16, 2024  9:19 AM Rea C wrote: Reason for CRM: Pt called in regards to Fosamax  refill. It was refused due to pt needing an appointment. PT scheduled an appt for this Wednesday but patient has a bone density test in March and she is asking if Dr. Catherine would like to wait until after the bone density scan to see her?   [phone] (M)

## 2024-05-16 NOTE — Telephone Encounter (Signed)
 Please tell her to keep her scheduled appointment this coming week.

## 2024-05-16 NOTE — Telephone Encounter (Signed)
 Copied from CRM #[id number]. Topic: Clinical - Medication Refill >> May 16, 2024  9:17 AM Rea C wrote: Medication: alendronate  (FOSAMAX ) 70 MG tablet  Has the patient contacted their pharmacy? Yes patient needed an appointment.  (Agent: If no, request that the patient contact the pharmacy for the refill. If patient does not wish to contact the pharmacy document the reason why and proceed with request.) (Agent: If yes, when and what did the pharmacy advise?)  This is the patient's preferred pharmacy:  [id number] - [place], [place] - [address] [place] [address] [place] [address] [place] [address] Phone: [phone] Fax: [phone]  Is this the correct pharmacy for this prescription? Yes If no, delete pharmacy and type the correct one.   Has the prescription been filled recently? Yes  Is the patient out of the medication? Yes  Has the patient been seen for an appointment in the last year OR does the patient have an upcoming appointment? Yes, May 18 2024 11:20 AM - Office Visit [facility] - Renee A Sheridan, DO    Can we respond through MyChart? Yes  Agent: Please be advised that Rx refills may take up to 3 business days. We ask that you follow-up with your pharmacy.

## 2024-05-18 ENCOUNTER — Ambulatory Visit: Payer: Self-pay | Admitting: Family Medicine

## 2024-05-18 ENCOUNTER — Ambulatory Visit: Admitting: Family Medicine

## 2024-05-18 ENCOUNTER — Encounter: Payer: Self-pay | Admitting: Family Medicine

## 2024-05-18 VITALS — BP 120/70 | HR 52 | Temp 97.7°F | Wt 102.6 lb

## 2024-05-18 DIAGNOSIS — R7309 Other abnormal glucose: Secondary | ICD-10-CM | POA: Insufficient documentation

## 2024-05-18 DIAGNOSIS — M818 Other osteoporosis without current pathological fracture: Secondary | ICD-10-CM | POA: Diagnosis not present

## 2024-05-18 DIAGNOSIS — I10 Essential (primary) hypertension: Secondary | ICD-10-CM

## 2024-05-18 DIAGNOSIS — L659 Nonscarring hair loss, unspecified: Secondary | ICD-10-CM | POA: Insufficient documentation

## 2024-05-18 LAB — VITAMIN D 25 HYDROXY (VIT D DEFICIENCY, FRACTURES): VITD: 75.75 ng/mL (ref 30.00–100.00)

## 2024-05-18 LAB — COMPREHENSIVE METABOLIC PANEL WITH GFR
ALT: 26 U/L (ref 3–35)
AST: 39 U/L — ABNORMAL HIGH (ref 5–37)
Albumin: 4.3 g/dL (ref 3.5–5.2)
Alkaline Phosphatase: 78 U/L (ref 39–117)
BUN: 17 mg/dL (ref 6–23)
CO2: 34 meq/L — ABNORMAL HIGH (ref 19–32)
Calcium: 10 mg/dL (ref 8.4–10.5)
Chloride: 97 meq/L (ref 96–112)
Creatinine, Ser: 0.81 mg/dL (ref 0.40–1.20)
GFR: 74.74 mL/min
Glucose, Bld: 90 mg/dL (ref 70–99)
Potassium: 4.6 meq/L (ref 3.5–5.1)
Sodium: 137 meq/L (ref 135–145)
Total Bilirubin: 0.7 mg/dL (ref 0.2–1.2)
Total Protein: 6.8 g/dL (ref 6.0–8.3)

## 2024-05-18 LAB — HEMOGLOBIN A1C: Hgb A1c MFr Bld: 6 % (ref 4.6–6.5)

## 2024-05-18 LAB — IBC + FERRITIN
Ferritin: 32.9 ng/mL (ref 10.0–291.0)
Iron: 135 ug/dL (ref 42–145)
Saturation Ratios: 32.9 % (ref 20.0–50.0)
TIBC: 410.2 ug/dL (ref 250.0–450.0)
Transferrin: 293 mg/dL (ref 212.0–360.0)

## 2024-05-18 LAB — TSH: TSH: 2.04 u[IU]/mL (ref 0.35–5.50)

## 2024-05-18 MED ORDER — ALENDRONATE SODIUM 70 MG PO TABS: ORAL_TABLET | ORAL | 3 refills | Status: AC

## 2024-05-18 MED ORDER — VALSARTAN-HYDROCHLOROTHIAZIDE 320-25 MG PO TABS: 1.0000 | ORAL_TABLET | Freq: Every day | ORAL | 1 refills | Status: AC

## 2024-05-18 NOTE — Progress Notes (Signed)
 "    [name], female  DOB: July 23, 1955, 69 y.o.   MRN: [id number] Patient Care Team    Relationship Specialty Notifications Start End  Catherine Charlies LABOR, DO PCP - General Family Medicine  04/23/15   Timmy Maude SAUNDERS, MD Consulting Physician Oncology  05/12/13   Kathleen Oh, MD Consulting Physician Pulmonary Disease  02/22/16   Chales Idol, MD Consulting Physician Urology  04/21/16   Kathleen Alexa RAMAN, PA-C  Dermatology  07/04/16    Comment: [facility], Kathleen HERO, DO Consulting Physician Sports Medicine  05/17/18   Plonk, Kathleen RAMAN, PA-C  Physician Assistant  05/25/18   Latisha Medford, MD Consulting Physician Obstetrics and Gynecology  12/30/21     Chief Complaint  Patient presents with   Hypertension    Subjective: [name] is a 69 y.o.  Female  present for Chronic Conditions/illness Management  All past medical history, surgical history, allergies, family history, immunizations, medications and social history were updated in the electronic medical record today. All recent labs, ED visits and hospitalizations within the last year were reviewed.  Primary hypertension/family history of heart disease Pt reports compliance with valsartan -HCTZ 320-25 Patient denies chest pain, shortness of breath, dizziness or lower extremity edema.  .  Pt takes a  daily baby ASA. Pt is not prescribed statin. RF: htn, smoker, fhx heart disease.    Osteoporosis without current pathological fracture, unspecified osteoporosis type/Current every day smoker Patient last DEXA 08/2020-osteoporosis, T-score -3.6. She is compliant with Fosamax  weekly dosing and is tolerating.  She is taking her vitamin D  supplement.  She has her DEXA scheduled through her gynecologist next month   COPD /pulmonary nodules/lesions, multiple/Abnormal CT scan of lung Follows with pulm routinely  Hair loss: Patient reports she has noticed more thinning of her hair  especially on the right side of her head.  She has tried over-the-counter products and online products without any improvement.     05/18/2024   11:11 AM 09/24/2023   11:05 AM 10/16/2022    2:06 PM 07/15/2021    8:47 AM 06/12/2021    2:36 PM  Depression screen PHQ 2/9  Decreased Interest 0 0 0 0 0  Down, Depressed, Hopeless 0 0 0 0 0  PHQ - 2 Score 0 0 0 0 0  Altered sleeping 0  0    Tired, decreased energy 0  1    Change in appetite 0  1    Feeling bad or failure about yourself  0  0    Trouble concentrating 0  0    Moving slowly or fidgety/restless 0  0    Suicidal thoughts 0  0    PHQ-9 Score 0  2     Difficult doing work/chores Not difficult at all         Data saved with a previous flowsheet row definition      05/18/2024   11:12 AM 10/16/2022    2:06 PM  GAD 7 : Generalized Anxiety Score  Nervous, Anxious, on Edge 0 0  Control/stop worrying 0 0  Worry too much - different things 0 0  Trouble relaxing 0 0  Restless 0 0  Easily annoyed or irritable 0 0  Afraid - awful might happen 0 0  Total GAD 7 Score 0 0  Anxiety Difficulty Not difficult at all       10/16/2022    2:06 PM 04/06/2023    8:57  AM 07/08/2023    2:13 PM 09/24/2023   11:05 AM 05/18/2024   11:11 AM  Fall Risk  Falls in the past year? 0 0  0 0 0  Was there an injury with Fall? 0    0  0  Fall Risk Category Calculator 0   0 0  Patient at Risk for Falls Due to No Fall Risks   No Fall Risks No Fall Risks  Fall risk Follow up Falls evaluation completed   Falls evaluation completed Falls evaluation completed     Manually entered by patient   Data saved with a previous flowsheet row definition      Immunization History  Administered Date(s) Administered   Fluad Trivalent(High Dose 65+) 02/11/2023   Influenza Inj Mdck Quad Pf 04/03/2016, 02/28/2020   Influenza Split 04/09/2011   Influenza Whole 02/02/2009   Influenza,inj,Quad PF,6+ Mos 01/26/2014, 02/06/2017, 03/04/2018, 01/26/2019, 01/14/2021    Influenza-Unspecified 02/02/2013, 02/24/2015, 04/03/2016   Moderna Sars-Covid-2 Vaccination 07/19/2019, 08/16/2019   PNEUMOCOCCAL CONJUGATE-20 07/15/2021   Pneumococcal Polysaccharide-23 01/26/2019   Tdap 03/04/2023   Zoster Recombinant(Shingrix) 03/04/2018, 01/14/2021    Past Medical History:  Diagnosis Date   Acute bursitis of right shoulder 09/16/2017   Injected in November 13, 2017   Acute lateral meniscal tear 09/09/2017   Arthritis    Avulsion fracture of lateral malleolus of right fibula 07/20/2018   Basal cell carcinoma    Chronic cough 06/07/2015   COPD (chronic obstructive pulmonary disease) (HCC)    DVT (deep venous thrombosis) (HCC)    Essential hypertension 07/27/2020   Hematuria 06/07/2015   07/2015: [facility] cysto and urine studies  09/2015: , cystoscopy and urine cytology negative for concerns, benign bladder neck polyp, grade 1 trabeculation, benign microhematuria. RTC PRN.      History of acute bronchitis 03/24/2011   Kidney stones    Lymphadenopathy, abdominal 03/2013   Noted on abd/pelv CT done to eval slow-to-resolve pyelo   Meniere disorder 01/23/2012   Osteoporosis 12/2014   Dr. Rosalynn, her GYN, dx'd her   Pneumonia    Pyelonephritis 10/28/2017   Recurrent pyelonephritis    Seasonal allergic rhinitis    Squamous cell carcinoma in situ (SCCIS) of skin of lower leg 05/04/2018   Dr. Toribio Meadows, [facility]   Squamous cell carcinoma in situ (SCCIS) of skin of lower leg 05/04/2018   Dr. Toribio Meadows, [facility]  Invasive Squamous cell carcinoma margins of excision appear free of tumor in the sections examined.  Left Lateral Leg: Superficially invasive squamous cell carcinoma, extending to the deep margin.    Trigger point of left shoulder region 06/12/2021   No Known Allergies Past Surgical History:  Procedure Laterality Date   DEXA  12/2014   T score spine -2.4, hip -3.5 (Dr. Rosalynn, [facility].   DILATION  AND CURETTAGE OF UTERUS  2009   SKIN LESION EXCISION Left 05/24/2018   SCC; Skin, Excision, Left Laeral leg: postsurgical scar, no neoplasm is identified.  Skin, Shave bx and ED&C, right anterior thigh-Superficially invasive squamous cell carcinoma, extending to the deep margin.   Family History  Problem Relation Age of Onset   Hypertension Mother    Glaucoma Mother    Irritable bowel syndrome Mother    Arthritis Mother    Kidney disease Mother    Diabetes Mother    Hypertension Father    Heart disease Father        bypasses   Cancer Father  sarcoma   Colon cancer Paternal Aunt 43   Heart disease Maternal Grandmother        CHF   Stroke Maternal Grandmother        in his 64's   Breast cancer Maternal Grandmother    Stroke Maternal Grandfather    Other Paternal Grandmother        hardening of the arteries   Heart attack Paternal Grandfather    Stomach cancer Neg Hx    Social History   Social History Narrative   Not on file    Allergies as of 05/18/2024   No Known Allergies      Medication List        Accurate as of May 18, 2024  1:56 PM. If you have any questions, ask your nurse or doctor.          STOP taking these medications    AeroChamber MV inhaler Stopped by: Charlies Bellini, DO   AFRIN NASAL SPRAY NA Stopped by: Charlies Bellini, DO   doxycycline  100 MG tablet Commonly known as: VIBRA -TABS Stopped by: Charlies Bellini, DO   fluorouracil  5 % cream Commonly known as: EFUDEX  Stopped by: Charlies Bellini, DO   Flutter Devi Stopped by: Charlies Bellini, DO   guaiFENesin  600 MG 12 hr tablet Commonly known as: Mucinex  Stopped by: Charlies Bellini, DO   predniSONE  10 MG tablet Commonly known as: DELTASONE  Stopped by: Charlies Bellini, DO   sodium chloride  HYPERTONIC 3 % nebulizer solution Stopped by: Charlies Bellini, DO   tretinoin 0.1 % cream Commonly known as: RETIN-A Stopped by: Charlies Bellini, DO       TAKE these medications    albuterol  (2.5  MG/3ML) 0.083% nebulizer solution Commonly known as: PROVENTIL  Take 3 mLs (2.5 mg total) by nebulization every 6 (six) hours as needed for wheezing or shortness of breath.   albuterol  108 (90 Base) MCG/ACT inhaler Commonly known as: VENTOLIN  HFA Inhale 2 puffs into the lungs every 6 (six) hours as needed for wheezing or shortness of breath. INHALE 1-2 PUFFS INTO THE LUNGS EVERY 4 HOURS AS NEEDED FOR WHEEZING OR SHORTNESS OF BREATH.   alendronate  70 MG tablet Commonly known as: FOSAMAX  TAKE ONE TABLET BY MOUTH ONCE A WEEK WITH A FULL GLASS OF WATER ON AN EMPTY STOMACH.   Breztri  Aerosphere 160-9-4.8 MCG/ACT Aero inhaler Generic drug: budesonide -glycopyrrolate -formoterol  Inhale 2 puffs into the lungs in the morning and at bedtime.   montelukast  10 MG tablet Commonly known as: SINGULAIR  Take 1 tablet (10 mg total) by mouth at bedtime.   multivitamin tablet Take 1 tablet by mouth daily.   valsartan -hydrochlorothiazide  320-25 MG tablet Commonly known as: DIOVAN -HCT Take 1 tablet by mouth daily.   VITAMIN D  (CHOLECALCIFEROL) PO Take 5,000 Units by mouth daily.        All past medical history, surgical history, allergies, family history, immunizations andmedications were updated in the EMR today and reviewed under the history and medication portions of their EMR.      ROS 14 pt review of systems performed and negative (unless mentioned in an HPI)  Objective: BP 120/70   Pulse (!) 52   Temp 97.7 F (36.5 C)   Wt 102 lb 9.6 oz (46.5 kg)   SpO2 95%   BMI 17.61 kg/m  Physical Exam Vitals and nursing note reviewed.  Constitutional:      General: She is not in acute distress.    Appearance: Normal appearance. She is not ill-appearing, toxic-appearing or diaphoretic.  HENT:  Head: Normocephalic and atraumatic.  Eyes:     General: No scleral icterus.       Right eye: No discharge.        Left eye: No discharge.     Extraocular Movements: Extraocular movements intact.      Conjunctiva/sclera: Conjunctivae normal.     Pupils: Pupils are equal, round, and reactive to light.  Cardiovascular:     Rate and Rhythm: Normal rate and regular rhythm.  Pulmonary:     Effort: Pulmonary effort is normal. No respiratory distress.     Breath sounds: Normal breath sounds. No wheezing, rhonchi or rales.  Musculoskeletal:     Right lower leg: No edema.     Left lower leg: No edema.  Skin:    General: Skin is warm.     Findings: No rash.  Neurological:     Mental Status: She is alert and oriented to person, place, and time. Mental status is at baseline.     Motor: No weakness.     Gait: Gait normal.  Psychiatric:        Mood and Affect: Mood normal.        Behavior: Behavior normal.        Thought Content: Thought content normal.        Judgment: Judgment normal.      No results found.  Assessment/plan: LILLYN WIECZOREK is a 69 y.o. female present for chronic condition management Primary hypertension/family history of heart disease Stable Continue valsartan  320-25 mg daily Cardiac CT screen: 2024- zero Smoking session Labs: Collected today  Osteoporosis without current pathological fracture, unspecified osteoporosis type/Current every day smoker Continue fosamax  weekly - Vitamin D  UTD -DEXA ordered by GYN   COPD/pulmonary nodules/lesions, multiple/Abnormal CT scan of lung Follows with pulm. Continue follow-up with pulm for management of COPD/inhalers.  Elevated hemoglobin A1c - Hemoglobin A1c -Low glycemic index diet and routine exercise recommended  Hair loss: She has noticed diffuse thinning hair but mostly on the right side of her scalp over the last 6 months. Vitamin D , iron and TSH collected today. She has tried over-the-counter products. Briefly discussed finasteride as an option in the future.  Return in about 24 weeks (around 11/02/2024).   Orders Placed This Encounter  Procedures   Comp Met (CMET)   Vitamin D  (25 hydroxy)    Hemoglobin A1c   TSH   IBC + Ferritin   Meds ordered this encounter  Medications   alendronate  (FOSAMAX ) 70 MG tablet    Sig: TAKE ONE TABLET BY MOUTH ONCE A WEEK WITH A FULL GLASS OF WATER ON AN EMPTY STOMACH.    Dispense:  12 tablet    Refill:  3   valsartan -hydrochlorothiazide  (DIOVAN -HCT) 320-25 MG tablet    Sig: Take 1 tablet by mouth daily.    Dispense:  90 tablet    Refill:  1    Dc other scripts for this med by other provider. thx   Referral Orders  No referral(s) requested today     Electronically signed by: Charlies Bellini, DO  [facility] "

## 2024-05-18 NOTE — Patient Instructions (Signed)

## 2024-06-01 ENCOUNTER — Telehealth: Payer: Self-pay

## 2024-06-01 ENCOUNTER — Other Ambulatory Visit: Payer: Self-pay | Admitting: Pulmonary Disease

## 2024-06-01 ENCOUNTER — Ambulatory Visit: Admitting: Pulmonary Disease

## 2024-06-01 ENCOUNTER — Encounter: Payer: Self-pay | Admitting: Pulmonary Disease

## 2024-06-01 ENCOUNTER — Ambulatory Visit: Payer: Self-pay | Admitting: Pulmonary Disease

## 2024-06-01 VITALS — BP 138/96 | HR 78 | Temp 97.9°F | Ht 64.0 in | Wt 103.0 lb

## 2024-06-01 DIAGNOSIS — J4550 Severe persistent asthma, uncomplicated: Secondary | ICD-10-CM | POA: Insufficient documentation

## 2024-06-01 DIAGNOSIS — D751 Secondary polycythemia: Secondary | ICD-10-CM

## 2024-06-01 DIAGNOSIS — J42 Unspecified chronic bronchitis: Secondary | ICD-10-CM | POA: Insufficient documentation

## 2024-06-01 DIAGNOSIS — J4489 Other specified chronic obstructive pulmonary disease: Secondary | ICD-10-CM | POA: Diagnosis not present

## 2024-06-01 LAB — CBC WITH DIFFERENTIAL/PLATELET
Basophils Absolute: 0.1 10*3/uL (ref 0.0–0.1)
Basophils Relative: 1.1 % (ref 0.0–3.0)
Eosinophils Absolute: 0.1 10*3/uL (ref 0.0–0.7)
Eosinophils Relative: 1 % (ref 0.0–5.0)
HCT: 58.2 % — ABNORMAL HIGH (ref 36.0–46.0)
Hemoglobin: 19.5 g/dL (ref 12.0–15.0)
Lymphocytes Relative: 28.1 % (ref 12.0–46.0)
Lymphs Abs: 1.9 10*3/uL (ref 0.7–4.0)
MCHC: 33.5 g/dL (ref 30.0–36.0)
MCV: 101.7 fl — ABNORMAL HIGH (ref 78.0–100.0)
Monocytes Absolute: 0.7 10*3/uL (ref 0.1–1.0)
Monocytes Relative: 10 % (ref 3.0–12.0)
Neutro Abs: 4.1 10*3/uL (ref 1.4–7.7)
Neutrophils Relative %: 59.8 % (ref 43.0–77.0)
Platelets: 172 10*3/uL (ref 150.0–400.0)
RBC: 5.72 Mil/uL — ABNORMAL HIGH (ref 3.87–5.11)
RDW: 14.3 % (ref 11.5–15.5)
WBC: 6.8 10*3/uL (ref 4.0–10.5)

## 2024-06-01 NOTE — Telephone Encounter (Signed)
 Darice from [employer] with critical lab.  Hemoglobin = 19.5  Verbally gave information to Dr. Annella.  Dr. Annella verified receipt.

## 2024-06-01 NOTE — Patient Instructions (Addendum)
 Nice to  see you!  Lab work today to decide on injections or biologics for helping with bronchitis and asthma symptoms  Based on results we can start process with paperwork etc to get a medication approved  Return to clinic in 4 months or sooner as needed

## 2024-06-01 NOTE — Progress Notes (Signed)
 Please contact patient and let her know:   Hemoglobin elevated on labs.  Much higher than prior which was only mild.  I recommend additional lab work to look for a blood disease that can cause high hemoglobin.  Also recommend an overnight oximetry test to see if oxygen is dropping at night.  Low oxygen is a common cause for high hemoglobin.  Lab work and overnight oximetry has been ordered.

## 2024-06-01 NOTE — Progress Notes (Signed)
 "  @Patient  ID: [name], female    DOB: 10-21-1955, 69 y.o.   MRN: [id number]  Chief Complaint  Patient presents with   COPD    Patient has no other complaints at the moment.    Referring provider: Catherine Charlies LABOR, DO  HPI:   69 y.o. woman history of COPD whom we are seeing for follow-up of the same.  Most recent pulmonary office note via NP x 2 reviewed.  Overall doing well.  Unfortunate, continues with frequent bronchitis, 3-4 times a year.  Twice in interim since last visit.  Initially with prednisone  and Levaquin .  Seems a bit better then came back.  And got prednisone  and doxycycline .  Overall better today.  Intermittent congestion largely unchanged.  We discussed at length frequent exacerbations of COPD.  Marked improvement by her report with prednisone .  Discussed likely asthma COPD overlap given quick response to prednisone .  Treating this with Biologics.   HPI initial visit: Establishing care with me today.  Diagnosed with COPD.  Maintained on Trelegy.  Unclear how much it helps.  She lives with dyspnea.  Some congestion at [facility].  Living outside.  Unclear how much the Trelegy is helping.  Notes with exacerbation in the past levofloxacin  helped quite a bit.  She is undergoing lung cancer screening.  Recent CT scan stable, ongoing care per Lauraine Lites, NP.  Discussed trying different halos.  New medicines help with COPD in the future.  Especially given some residual dyspnea.  She expressed understanding.  Questionaires / Pulmonary Flowsheets:   ACT:      No data to display          MMRC: mMRC Dyspnea Scale mMRC Score  03/21/2020 10:00 AM 2    Epworth:      No data to display          Tests:   FENO:  No results found for: NITRICOXIDE  PFT:    Latest Ref Rng & Units 08/25/2016   11:03 AM  PFT Results  FVC-Pre L 2.63   FVC-Predicted Pre % 77   FVC-Post L 2.72   FVC-Predicted Post % 80   Pre FEV1/FVC % % 55   Post FEV1/FCV % % 57   FEV1-Pre L  1.45   FEV1-Predicted Pre % 55   FEV1-Post L 1.55   DLCO uncorrected ml/min/mmHg 13.36   DLCO UNC% % 52   DLCO corrected ml/min/mmHg 13.01   DLCO COR %Predicted % 51   DLVA Predicted % 61   TLC L 5.33   TLC % Predicted % 103   RV % Predicted % 125   Personally viewed interpreted as moderate fixed obstruction, COPD, no significant bronchodilator response, lung volumes consistent with air trapping, DLCO moderately reduced  WALK:      No data to display          Imaging: Personally reviewed and as per EMR and discussion in this note No results found.  Lab Results: Personally reviewed CBC    Component Value Date/Time   WBC 7.3 09/24/2023 1124   RBC 5.50 (H) 09/24/2023 1124   HGB 17.5 (H) 09/24/2023 1124   HGB 14.6 06/01/2013 1107   HCT 53.7 (H) 09/24/2023 1124   HCT 44.9 06/01/2013 1107   PLT 198.0 09/24/2023 1124   PLT 199 06/01/2013 1107   MCV 97.6 09/24/2023 1124   MCV 93 06/01/2013 1107   MCH 32.9 11/04/2018 1319   MCHC 32.7 09/24/2023 1124   RDW 15.1 09/24/2023  1124   RDW 13.6 06/01/2013 1107   LYMPHSABS 2.4 09/24/2023 1124   LYMPHSABS 2.5 06/01/2013 1107   MONOABS 0.5 09/24/2023 1124   EOSABS 0.1 09/24/2023 1124   EOSABS 0.0 06/01/2013 1107   BASOSABS 0.1 09/24/2023 1124   BASOSABS 0.0 06/01/2013 1107    BMET    Component Value Date/Time   NA 137 05/18/2024 1139   K 4.6 05/18/2024 1139   CL 97 05/18/2024 1139   CO2 34 (H) 05/18/2024 1139   GLUCOSE 90 05/18/2024 1139   BUN 17 05/18/2024 1139   CREATININE 0.81 05/18/2024 1139   CREATININE 0.72 09/24/2023 1124   CALCIUM  10.0 05/18/2024 1139   GFRNONAA >60 11/04/2018 1319   GFRAA >60 11/04/2018 1319    BNP No results found for: BNP  ProBNP No results found for: PROBNP  Specialty Problems       Pulmonary Problems   Pulmonary nodules/lesions, multiple    No Known Allergies  Immunization History  Administered Date(s) Administered   Fluad Trivalent(High Dose 65+) 02/11/2023    Influenza Inj Mdck Quad Pf 04/03/2016, 02/28/2020   Influenza Split 04/09/2011   Influenza Whole 02/02/2009   Influenza,inj,Quad PF,6+ Mos 01/26/2014, 02/06/2017, 03/04/2018, 01/26/2019, 01/14/2021   Influenza-Unspecified 02/02/2013, 02/24/2015, 04/03/2016   Moderna Sars-Covid-2 Vaccination 07/19/2019, 08/16/2019   PNEUMOCOCCAL CONJUGATE-20 07/15/2021   Pneumococcal Polysaccharide-23 01/26/2019   Tdap 03/04/2023   Zoster Recombinant(Shingrix) 03/04/2018, 01/14/2021    Past Medical History:  Diagnosis Date   Acute bursitis of right shoulder 09/16/2017   Injected in November 13, 2017   Acute lateral meniscal tear 09/09/2017   Arthritis    Avulsion fracture of lateral malleolus of right fibula 07/20/2018   Basal cell carcinoma    Chronic cough 06/07/2015   COPD (chronic obstructive pulmonary disease) (HCC)    DVT (deep venous thrombosis) (HCC)    Essential hypertension 07/27/2020   Hematuria 06/07/2015   07/2015: [facility] cysto and urine studies  09/2015: , cystoscopy and urine cytology negative for concerns, benign bladder neck polyp, grade 1 trabeculation, benign microhematuria. RTC PRN.      History of acute bronchitis 03/24/2011   Kidney stones    Lymphadenopathy, abdominal 03/2013   Noted on abd/pelv CT done to eval slow-to-resolve pyelo   Meniere disorder 01/23/2012   Osteoporosis 12/2014   Dr. Rosalynn, her GYN, dx'd her   Pneumonia    Pyelonephritis 10/28/2017   Recurrent pyelonephritis    Seasonal allergic rhinitis    Squamous cell carcinoma in situ (SCCIS) of skin of lower leg 05/04/2018   Dr. Toribio Meadows, [facility]   Squamous cell carcinoma in situ (SCCIS) of skin of lower leg 05/04/2018   Dr. Toribio Meadows, [facility]  Invasive Squamous cell carcinoma margins of excision appear free of tumor in the sections examined.  Left Lateral Leg: Superficially invasive squamous cell carcinoma, extending to the deep margin.    Trigger point of left  shoulder region 06/12/2021    Tobacco History: Social History   Tobacco Use  Smoking Status Every Day   Current packs/day: 1.00   Average packs/day: 1 pack/day for 46.0 years (46.0 ttl pk-yrs)   Types: Cigarettes   Start date: 06/01/1978   Passive exposure: Past  Smokeless Tobacco Never  Tobacco Comments   1 pack a day 03/18/2024   Ready to quit: No Counseling given: Yes Tobacco comments: 1 pack a day 03/18/2024   Continue to not smoke  Outpatient Encounter Medications as of 06/01/2024  Medication  Sig   albuterol  (PROVENTIL ) (2.5 MG/3ML) 0.083% nebulizer solution Take 3 mLs (2.5 mg total) by nebulization every 6 (six) hours as needed for wheezing or shortness of breath.   albuterol  (VENTOLIN  HFA) 108 (90 Base) MCG/ACT inhaler Inhale 2 puffs into the lungs every 6 (six) hours as needed for wheezing or shortness of breath. INHALE 1-2 PUFFS INTO THE LUNGS EVERY 4 HOURS AS NEEDED FOR WHEEZING OR SHORTNESS OF BREATH.   alendronate  (FOSAMAX ) 70 MG tablet TAKE ONE TABLET BY MOUTH ONCE A WEEK WITH A FULL GLASS OF WATER ON AN EMPTY STOMACH.   budeson-glycopyrrolate -formoterol  (BREZTRI  AEROSPHERE) 160-9-4.8 MCG/ACT AERO Inhale 2 puffs into the lungs in the morning and at bedtime.   fluorouracil  (EFUDEX ) 5 % cream Apply topically 2 (two) times daily as needed.   montelukast  (SINGULAIR ) 10 MG tablet Take 1 tablet (10 mg total) by mouth at bedtime.   Multiple Vitamin (MULTIVITAMIN) tablet Take 1 tablet by mouth daily.   RESTASIS 0.05 % ophthalmic emulsion 1 drop 2 (two) times daily.   valsartan -hydrochlorothiazide  (DIOVAN -HCT) 320-25 MG tablet Take 1 tablet by mouth daily.   VITAMIN D , CHOLECALCIFEROL, PO Take 5,000 Units by mouth daily.    No facility-administered encounter medications on file as of 06/01/2024.     Review of Systems  Review of Systems  N/a Physical Exam  BP (!) 138/96   Pulse 78   Temp 97.9 F (36.6 C) (Oral)   Ht 5' 4 (1.626 m)   Wt 103 lb (46.7 kg)   SpO2 92%    BMI 17.68 kg/m   Wt Readings from Last 5 Encounters:  06/01/24 103 lb (46.7 kg)  05/18/24 102 lb 9.6 oz (46.5 kg)  03/18/24 102 lb (46.3 kg)  02/03/24 102 lb 12.8 oz (46.6 kg)  12/11/23 104 lb 6.4 oz (47.4 kg)    BMI Readings from Last 5 Encounters:  06/01/24 17.68 kg/m  05/18/24 17.61 kg/m  03/18/24 17.51 kg/m  02/03/24 17.92 kg/m  12/11/23 17.92 kg/m     Physical Exam General: Sitting up in chair, no distress Eyes: No icterus Neck: No JVP Pulmonary: Relatively clear, distant throughout, low pitch index.  We suspect related to mucus impaction Cardiovascular: No edema noted Abdomen: Nondistended MSK: No synovitis, no joint effusion Neuro: Normal gait, no weakness Psych: Normal mood, full affect   Assessment & Plan:   Moderate COPD with asthma overlap: Based on pulmonary function test 2018.  Overall improved with Breztri .  Frequent exacerbations, bronchitis symptoms 2-4 times a year.  Eosinophils not elevated in the past.  Repeat blood work today for phenotyping.  Favor starting Biologics based on results in the coming weeks.  Chronic bronchitis: Reflective of history of cigarette smoking and likely chronic asthma changes.  Treatment as above.    Return in about 6 months (around 11/29/2024).   Donnice JONELLE Beals, MD 06/01/2024    "

## 2024-06-02 LAB — IGE: IgE (Immunoglobulin E), Serum: 227 kU/L — ABNORMAL HIGH

## 2024-06-03 LAB — ALLERGEN PROFILE, PERENNIAL ALLERGEN IGE
Alternaria Alternata IgE: <0.1 kU/L
Aspergillus Fumigatus IgE: 0.87 kU/L — AB
Aureobasidi Pullulans IgE: <0.1 kU/L
Candida Albicans IgE: <0.1 kU/L
Cat Dander IgE: <0.1 kU/L
Chicken Feathers IgE: <0.1 kU/L
Cladosporium Herbarum IgE: <0.1 kU/L
Cow Dander IgE: <0.1 kU/L
D Farinae IgE: <0.1 kU/L
D Pteronyssinus IgE: <0.1 kU/L
Dog Dander IgE: <0.1 kU/L
Duck Feathers IgE: <0.1 kU/L
Goose Feathers IgE: <0.1 kU/L
Mouse Urine IgE: <0.1 kU/L
Mucor Racemosus IgE: <0.1 kU/L
Penicillium Chrysogen IgE: 0.17 kU/L — AB
Phoma Betae IgE: <0.1 kU/L
Setomelanomma Rostrat: <0.1 kU/L
Stemphylium Herbarum IgE: <0.1 kU/L

## 2024-06-07 ENCOUNTER — Other Ambulatory Visit

## 2024-06-07 ENCOUNTER — Telehealth: Payer: Self-pay

## 2024-06-07 DIAGNOSIS — J449 Chronic obstructive pulmonary disease, unspecified: Secondary | ICD-10-CM

## 2024-06-07 DIAGNOSIS — J4550 Severe persistent asthma, uncomplicated: Secondary | ICD-10-CM

## 2024-06-07 DIAGNOSIS — J471 Bronchiectasis with (acute) exacerbation: Secondary | ICD-10-CM

## 2024-10-03 ENCOUNTER — Ambulatory Visit: Admitting: Pulmonary Disease

## 2024-11-15 ENCOUNTER — Ambulatory Visit: Admitting: Family Medicine
